# Patient Record
Sex: Male | Born: 1940
Health system: Southern US, Community
[De-identification: ages and names within clinical notes are randomized; demographics above are authoritative.]

## PROBLEM LIST (undated history)

## (undated) DIAGNOSIS — I152 Hypertension secondary to endocrine disorders: Secondary | ICD-10-CM

## (undated) DIAGNOSIS — F329 Major depressive disorder, single episode, unspecified: Secondary | ICD-10-CM

## (undated) DIAGNOSIS — G629 Polyneuropathy, unspecified: Secondary | ICD-10-CM

## (undated) DIAGNOSIS — E669 Obesity, unspecified: Secondary | ICD-10-CM

## (undated) DIAGNOSIS — F419 Anxiety disorder, unspecified: Secondary | ICD-10-CM

## (undated) DIAGNOSIS — I2510 Atherosclerotic heart disease of native coronary artery without angina pectoris: Secondary | ICD-10-CM

## (undated) DIAGNOSIS — M810 Age-related osteoporosis without current pathological fracture: Secondary | ICD-10-CM

## (undated) DIAGNOSIS — M899 Disorder of bone, unspecified: Secondary | ICD-10-CM

## (undated) DIAGNOSIS — K219 Gastro-esophageal reflux disease without esophagitis: Secondary | ICD-10-CM

## (undated) DIAGNOSIS — E119 Type 2 diabetes mellitus without complications: Secondary | ICD-10-CM

## (undated) DIAGNOSIS — E785 Hyperlipidemia, unspecified: Secondary | ICD-10-CM

## (undated) DIAGNOSIS — I10 Essential (primary) hypertension: Secondary | ICD-10-CM

## (undated) DIAGNOSIS — E1140 Type 2 diabetes mellitus with diabetic neuropathy, unspecified: Secondary | ICD-10-CM

## (undated) DIAGNOSIS — Z794 Long term (current) use of insulin: Secondary | ICD-10-CM

## (undated) DIAGNOSIS — N183 Chronic kidney disease, stage 3 unspecified: Secondary | ICD-10-CM

## (undated) DIAGNOSIS — E7800 Pure hypercholesterolemia, unspecified: Secondary | ICD-10-CM

## (undated) DIAGNOSIS — M1990 Unspecified osteoarthritis, unspecified site: Secondary | ICD-10-CM

## (undated) DIAGNOSIS — R55 Syncope and collapse: Secondary | ICD-10-CM

## (undated) DIAGNOSIS — F32A Depression, unspecified: Secondary | ICD-10-CM

## (undated) DIAGNOSIS — D649 Anemia, unspecified: Secondary | ICD-10-CM

## (undated) DIAGNOSIS — R001 Bradycardia, unspecified: Secondary | ICD-10-CM

## (undated) DIAGNOSIS — N189 Chronic kidney disease, unspecified: Secondary | ICD-10-CM

## (undated) DIAGNOSIS — K5792 Diverticulitis of intestine, part unspecified, without perforation or abscess without bleeding: Secondary | ICD-10-CM

## (undated) DIAGNOSIS — F411 Generalized anxiety disorder: Secondary | ICD-10-CM

## (undated) DIAGNOSIS — IMO0001 Reserved for inherently not codable concepts without codable children: Secondary | ICD-10-CM

## (undated) HISTORY — DX: Syncope and collapse: R55

## (undated) HISTORY — DX: Major depressive disorder, single episode, unspecified: F32.9

## (undated) HISTORY — DX: Hypertension secondary to endocrine disorders: I15.2

## (undated) HISTORY — DX: Atherosclerotic heart disease of native coronary artery without angina pectoris: I25.10

## (undated) HISTORY — PX: CATARACT EXTRACTION: SUR2

## (undated) HISTORY — DX: Type 2 diabetes mellitus without complications: E11.9

## (undated) HISTORY — DX: Disorder of bone, unspecified: M89.9

## (undated) HISTORY — DX: Generalized anxiety disorder: F41.1

## (undated) HISTORY — DX: Hyperlipidemia, unspecified: E78.5

## (undated) HISTORY — DX: Long term (current) use of insulin: Z79.4

## (undated) HISTORY — DX: Gastro-esophageal reflux disease without esophagitis: K21.9

## (undated) HISTORY — DX: Chronic kidney disease, stage 3 unspecified: N18.30

## (undated) HISTORY — DX: Type 2 diabetes mellitus with diabetic neuropathy, unspecified: E11.40

## (undated) HISTORY — PX: CARPAL TUNNEL RELEASE: SHX101

## (undated) HISTORY — PX: COLONOSCOPY: SHX174

## (undated) HISTORY — DX: Essential (primary) hypertension: I10

---
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
## 2005-01-30 ENCOUNTER — Ambulatory Visit: Payer: Self-pay | Admitting: Internal Medicine

## 2005-02-17 ENCOUNTER — Ambulatory Visit: Payer: Self-pay | Admitting: Gastroenterology

## 2005-03-24 ENCOUNTER — Ambulatory Visit: Payer: Self-pay | Admitting: Gastroenterology

## 2005-08-11 ENCOUNTER — Ambulatory Visit: Payer: Self-pay | Admitting: Gastroenterology

## 2005-12-06 ENCOUNTER — Ambulatory Visit: Payer: Self-pay | Admitting: Internal Medicine

## 2005-12-16 ENCOUNTER — Ambulatory Visit: Payer: Self-pay | Admitting: Internal Medicine

## 2006-01-16 ENCOUNTER — Ambulatory Visit: Payer: Self-pay | Admitting: Internal Medicine

## 2007-03-18 ENCOUNTER — Ambulatory Visit: Payer: Self-pay | Admitting: Gastroenterology

## 2007-03-27 ENCOUNTER — Ambulatory Visit: Payer: Self-pay | Admitting: Specialist

## 2009-12-03 ENCOUNTER — Ambulatory Visit: Payer: Self-pay | Admitting: Gastroenterology

## 2009-12-06 LAB — PATHOLOGY REPORT

## 2010-03-10 ENCOUNTER — Ambulatory Visit: Payer: Self-pay | Admitting: Orthopedic Surgery

## 2010-03-22 ENCOUNTER — Other Ambulatory Visit: Payer: Self-pay | Admitting: Orthopedic Surgery

## 2010-03-22 DIAGNOSIS — M545 Low back pain, unspecified: Secondary | ICD-10-CM

## 2010-03-24 ENCOUNTER — Ambulatory Visit
Admission: RE | Admit: 2010-03-24 | Discharge: 2010-03-24 | Disposition: A | Discharge status: Home / Self Care | Payer: Medicare Other | Source: Ambulatory Visit | Attending: Orthopedic Surgery | Admitting: Orthopedic Surgery

## 2010-03-24 ENCOUNTER — Other Ambulatory Visit: Payer: Self-pay | Admitting: Orthopedic Surgery

## 2010-03-24 DIAGNOSIS — M545 Low back pain, unspecified: Secondary | ICD-10-CM

## 2010-04-07 ENCOUNTER — Ambulatory Visit
Admission: RE | Admit: 2010-04-07 | Discharge: 2010-04-07 | Disposition: A | Discharge status: Home / Self Care | Payer: Medicare Other | Source: Ambulatory Visit | Attending: Orthopedic Surgery | Admitting: Orthopedic Surgery

## 2010-04-07 DIAGNOSIS — M545 Low back pain, unspecified: Secondary | ICD-10-CM

## 2010-05-12 ENCOUNTER — Other Ambulatory Visit: Payer: Self-pay | Admitting: Orthopedic Surgery

## 2010-05-12 DIAGNOSIS — M545 Low back pain, unspecified: Secondary | ICD-10-CM

## 2010-05-16 ENCOUNTER — Ambulatory Visit
Admission: RE | Admit: 2010-05-16 | Discharge: 2010-05-16 | Disposition: A | Discharge status: Home / Self Care | Payer: Medicare Other | Source: Ambulatory Visit | Attending: Orthopedic Surgery | Admitting: Orthopedic Surgery

## 2010-05-16 DIAGNOSIS — M545 Low back pain, unspecified: Secondary | ICD-10-CM

## 2010-09-09 ENCOUNTER — Other Ambulatory Visit: Payer: Self-pay | Admitting: Orthopedic Surgery

## 2010-09-09 DIAGNOSIS — M545 Low back pain, unspecified: Secondary | ICD-10-CM

## 2010-09-27 ENCOUNTER — Ambulatory Visit
Admission: RE | Admit: 2010-09-27 | Discharge: 2010-09-27 | Disposition: A | Discharge status: Home / Self Care | Payer: Medicare Other | Source: Ambulatory Visit | Attending: Orthopedic Surgery | Admitting: Orthopedic Surgery

## 2010-09-27 DIAGNOSIS — M545 Low back pain, unspecified: Secondary | ICD-10-CM

## 2010-09-27 MED ORDER — METHYLPREDNISOLONE ACETATE 40 MG/ML INJ SUSP (RADIOLOG
120.0000 mg | Freq: Once | INTRAMUSCULAR | Status: AC
  Administered 2010-09-27: 120 mg via EPIDURAL

## 2010-09-27 MED ORDER — IOHEXOL 180 MG/ML  SOLN
1.0000 mL | Freq: Once | INTRAMUSCULAR | Status: AC | PRN
  Administered 2010-09-27: 1 mL via EPIDURAL

## 2011-11-18 ENCOUNTER — Ambulatory Visit: Payer: Self-pay | Admitting: Internal Medicine

## 2012-03-08 ENCOUNTER — Ambulatory Visit: Payer: Self-pay | Admitting: Unknown Physician Specialty

## 2012-10-24 ENCOUNTER — Ambulatory Visit: Payer: Self-pay

## 2013-01-14 ENCOUNTER — Ambulatory Visit: Payer: Self-pay | Admitting: Gastroenterology

## 2013-01-15 LAB — PATHOLOGY REPORT

## 2014-02-15 ENCOUNTER — Emergency Department: Payer: Self-pay | Admitting: Emergency Medicine

## 2014-02-15 LAB — COMPREHENSIVE METABOLIC PANEL
Albumin: 3.8 g/dL (ref 3.4–5.0)
Alkaline Phosphatase: 67 U/L (ref 46–116)
Anion Gap: 5 — ABNORMAL LOW (ref 7–16)
BUN: 22 mg/dL — ABNORMAL HIGH (ref 7–18)
Bilirubin,Total: 0.6 mg/dL (ref 0.2–1.0)
Calcium, Total: 10.4 mg/dL — ABNORMAL HIGH (ref 8.5–10.1)
Chloride: 105 mmol/L (ref 98–107)
Co2: 29 mmol/L (ref 21–32)
Creatinine: 1.36 mg/dL — ABNORMAL HIGH (ref 0.60–1.30)
EGFR (African American): >60
EGFR (Non-African Amer.): 55 — ABNORMAL LOW
Glucose: 90 mg/dL (ref 65–99)
Osmolality: 280 (ref 275–301)
Potassium: 4.9 mmol/L (ref 3.5–5.1)
SGOT(AST): 23 U/L (ref 15–37)
SGPT (ALT): 19 U/L (ref 14–63)
Sodium: 139 mmol/L (ref 136–145)
Total Protein: 7.9 g/dL (ref 6.4–8.2)

## 2014-02-15 LAB — URINALYSIS, COMPLETE
Bacteria: NONE SEEN
Bilirubin,UR: NEGATIVE
Blood: NEGATIVE
Glucose,UR: NEGATIVE mg/dL (ref 0–75)
Ketone: NEGATIVE
Leukocyte Esterase: NEGATIVE
Nitrite: NEGATIVE
Ph: 5 (ref 4.5–8.0)
Protein: NEGATIVE
RBC,UR: <1 /HPF (ref 0–5)
Specific Gravity: 1.014 (ref 1.003–1.030)
Squamous Epithelial: NONE SEEN
WBC UR: <1 /HPF (ref 0–5)

## 2014-02-15 LAB — CBC
HCT: 36.7 % — ABNORMAL LOW (ref 40.0–52.0)
HGB: 12.2 g/dL — ABNORMAL LOW (ref 13.0–18.0)
MCH: 28.9 pg (ref 26.0–34.0)
MCHC: 33.2 g/dL (ref 32.0–36.0)
MCV: 87 fL (ref 80–100)
Platelet: 176 10*3/uL (ref 150–440)
RBC: 4.22 10*6/uL — ABNORMAL LOW (ref 4.40–5.90)
RDW: 12.7 % (ref 11.5–14.5)
WBC: 11 10*3/uL — ABNORMAL HIGH (ref 3.8–10.6)

## 2014-03-05 ENCOUNTER — Ambulatory Visit: Payer: Self-pay | Admitting: Ophthalmology

## 2014-03-05 DIAGNOSIS — I10 Essential (primary) hypertension: Secondary | ICD-10-CM

## 2014-03-06 ENCOUNTER — Emergency Department: Payer: Self-pay | Admitting: Emergency Medicine

## 2014-03-11 ENCOUNTER — Ambulatory Visit: Payer: Self-pay | Admitting: Cardiology

## 2014-03-11 ENCOUNTER — Ambulatory Visit (INDEPENDENT_AMBULATORY_CARE_PROVIDER_SITE_OTHER): Payer: Commercial Managed Care - HMO | Admitting: Cardiology

## 2014-03-11 ENCOUNTER — Encounter (INDEPENDENT_AMBULATORY_CARE_PROVIDER_SITE_OTHER): Payer: Self-pay

## 2014-03-11 ENCOUNTER — Encounter: Payer: Self-pay | Admitting: Cardiology

## 2014-03-11 VITALS — BP 156/74 | HR 49 | Ht 65.0 in | Wt 184.0 lb

## 2014-03-11 DIAGNOSIS — I10 Essential (primary) hypertension: Secondary | ICD-10-CM

## 2014-03-11 DIAGNOSIS — R0989 Other specified symptoms and signs involving the circulatory and respiratory systems: Secondary | ICD-10-CM

## 2014-03-11 DIAGNOSIS — Z794 Long term (current) use of insulin: Secondary | ICD-10-CM

## 2014-03-11 DIAGNOSIS — R6889 Other general symptoms and signs: Secondary | ICD-10-CM

## 2014-03-11 DIAGNOSIS — R0600 Dyspnea, unspecified: Secondary | ICD-10-CM

## 2014-03-11 DIAGNOSIS — E785 Hyperlipidemia, unspecified: Secondary | ICD-10-CM

## 2014-03-11 DIAGNOSIS — R001 Bradycardia, unspecified: Secondary | ICD-10-CM

## 2014-03-11 DIAGNOSIS — R55 Syncope and collapse: Secondary | ICD-10-CM

## 2014-03-11 DIAGNOSIS — R0609 Other forms of dyspnea: Secondary | ICD-10-CM

## 2014-03-11 DIAGNOSIS — E119 Type 2 diabetes mellitus without complications: Secondary | ICD-10-CM

## 2014-03-11 NOTE — Patient Instructions (Addendum)
Your physician has requested that you have an echocardiogram. Echocardiography is a painless test that uses sound waves to create images of your heart. It provides your doctor with information about the size and shape of your heart and how well your heart's chambers and valves are working. This procedure takes approximately one hour. There are no restrictions for this procedure.  Your physician has requested that you have a carotid duplex. This test is an ultrasound of the carotid arteries in your neck. It looks at blood flow through these arteries that supply the brain with blood. Allow one hour for this exam. There are no restrictions or special instructions.  Your physician has recommended that you wear an event monitor. Event monitors are medical devices that record the heart's electrical activity. Doctors most often [place] these monitors to diagnose arrhythmias. Arrhythmias are problems with the speed or rhythm of the heartbeat. The monitor is a small, portable device. You can wear one while you do your normal daily activities. This is usually used to diagnose what is causing palpitations/syncope (passing out).  Ronald Wallace  Your caregiver has ordered a Stress Test with nuclear imaging. The purpose of this test is to evaluate the blood supply to your heart muscle. This procedure is referred to as a "Non-Invasive Stress Test." This is because other than having an IV started in your vein, nothing is inserted or "invades" your body. Cardiac stress tests are done to find areas of poor blood flow to the heart by determining the extent of coronary artery disease (CAD). Some patients exercise on a treadmill, which naturally increases the blood flow to your heart, while others who are  unable to walk on a treadmill due to physical limitations have a pharmacologic/chemical stress agent called Lexiscan . This medicine will mimic walking on a treadmill by temporarily increasing your coronary blood flow.   Please  note: these test may take anywhere between 2-4 hours to complete  PLEASE REPORT TO [name] AT THE FIRST DESK WILL DIRECT YOU WHERE TO GO  Date of Procedure:___________2/26/16_________________________  Arrival Time for Procedure:_______0745 am_______________________  Instructions regarding medication:   _x___ : Hold diabetes medication morning of procedure    PLEASE NOTIFY THE OFFICE AT LEAST 24 HOURS IN ADVANCE IF YOU ARE UNABLE TO KEEP YOUR APPOINTMENT.  [phone] AND  PLEASE NOTIFY NUCLEAR MEDICINE AT [facility] AT LEAST 24 HOURS IN ADVANCE IF YOU ARE UNABLE TO KEEP YOUR APPOINTMENT. [phone]  How to prepare for your Myoview test:  1. Do not eat or drink after midnight 2. No caffeine for 24 hours prior to test 3. No smoking 24 hours prior to test. 4. Your medication may be taken with water.  If your doctor stopped a medication because of this test, do not take that medication. 5. Ladies, please do not wear dresses.  Skirts or pants are appropriate. Please wear a short sleeve shirt. 6. No perfume, cologne or lotion. 7. Wear comfortable walking shoes. No heels!    NO WORK OR DRIVING UNTIL AFTER YOUR ECHO AND STRESS TEST

## 2014-03-12 ENCOUNTER — Encounter: Payer: Self-pay | Admitting: Cardiology

## 2014-03-12 ENCOUNTER — Telehealth: Payer: Self-pay | Admitting: *Deleted

## 2014-03-12 ENCOUNTER — Other Ambulatory Visit (INDEPENDENT_AMBULATORY_CARE_PROVIDER_SITE_OTHER): Payer: Commercial Managed Care - HMO

## 2014-03-12 ENCOUNTER — Other Ambulatory Visit: Payer: Self-pay

## 2014-03-12 DIAGNOSIS — I152 Hypertension secondary to endocrine disorders: Secondary | ICD-10-CM | POA: Insufficient documentation

## 2014-03-12 DIAGNOSIS — R55 Syncope and collapse: Secondary | ICD-10-CM

## 2014-03-12 DIAGNOSIS — R0609 Other forms of dyspnea: Secondary | ICD-10-CM

## 2014-03-12 DIAGNOSIS — R001 Bradycardia, unspecified: Secondary | ICD-10-CM | POA: Insufficient documentation

## 2014-03-12 DIAGNOSIS — R0989 Other specified symptoms and signs involving the circulatory and respiratory systems: Secondary | ICD-10-CM

## 2014-03-12 DIAGNOSIS — R0600 Dyspnea, unspecified: Secondary | ICD-10-CM | POA: Insufficient documentation

## 2014-03-12 DIAGNOSIS — I10 Essential (primary) hypertension: Secondary | ICD-10-CM | POA: Insufficient documentation

## 2014-03-12 DIAGNOSIS — E785 Hyperlipidemia, unspecified: Secondary | ICD-10-CM | POA: Insufficient documentation

## 2014-03-12 DIAGNOSIS — R6889 Other general symptoms and signs: Secondary | ICD-10-CM | POA: Insufficient documentation

## 2014-03-12 DIAGNOSIS — E119 Type 2 diabetes mellitus without complications: Secondary | ICD-10-CM | POA: Insufficient documentation

## 2014-03-12 DIAGNOSIS — E1159 Type 2 diabetes mellitus with other circulatory complications: Secondary | ICD-10-CM | POA: Insufficient documentation

## 2014-03-12 DIAGNOSIS — Z794 Long term (current) use of insulin: Secondary | ICD-10-CM

## 2014-03-12 HISTORY — PX: TRANSTHORACIC ECHOCARDIOGRAM: SHX275

## 2014-03-12 HISTORY — DX: Bradycardia, unspecified: R00.1

## 2014-03-12 HISTORY — DX: Dyspnea, unspecified: R06.00

## 2014-03-12 HISTORY — DX: Other forms of dyspnea: R06.09

## 2014-03-12 HISTORY — DX: Other specified symptoms and signs involving the circulatory and respiratory systems: R09.89

## 2014-03-12 NOTE — Assessment & Plan Note (Signed)
I see that he is taking Neurontin which I suggested he has neuropathy. Cannot exclude autonomic dysfunction as the etiology of syncope.

## 2014-03-12 NOTE — Progress Notes (Signed)
PATIENT: Ronald Wallace MRN: [id number] DOB: 01-02-1941 PCP: [name], MD  Clinic Note: Chief Complaint  Patient presents with  . other    C/o syncope. Meds reviewed verbally with pt.    HPI: Ronald Wallace is a 74 y.o. male with a PMH below who presents today for evaluation of a syncopal event. He has no history of type 2 diabetes on insulin, hypertension hyperlipidemia no previous diagnosed cardiac history. His mother apparently had an MI in her 8s. Ronald Wallace was referred for cardiology evaluation after presenting to the [facility] emergency room the following a recent syncopal episode. He was in his usual state of health, with no major complaints besides what seems to be progressively worsening exertional dyspnea and decreased exercise tolerance. His daughter want to make sure that he mentioned that over the last several months he has been getting more tired and short of breath with exertion. He is not able to take a flight of steps without getting short of breath. He does not note any resting or exertional chest tightness or pressure however. On Tuesday, February 16, he was at work, feeling relatively well with no issues. He had a routine breakfast but nothing different that day.  He was doing his routine of checking Card-Key locks throughout the building. He was on a small platform checking on a dose, and when he turned around to go to the next-door she blacked out and fell off a platform landing on his ribs. He apparently also hit his head, was not aware of it. When he first became aroused following a fall he was quite disoriented, did not know that he did hit his head. Someone mentioned that he had some dullness at doses ribs are hurting quite a bit so he went to be evaluated at the urgent care/emergency room. Chest x-rays were done which did not show any evidence of fractures, but no evaluation was done to workup for syncope or potential concussion. They note that he was  bradycardic with a heart rate of 50 beats per minute on his EKG in the emergency room. He has not had any further passing out spells since that episode. He denied having any sensation of palpitations rapid/irregular heartbeats, chest pain or dyspnea prior to this episode appeared nothing led up to it, and he felt fine afterwards.  He does have some mild lower extremity edema, but denies any PND or orthopnea.  He has not had any yet symptoms to suggest TIA or amaurosis fugax. After his family denied any focal weakness or numbness.his loss of consciousness episode was not preceded by any lightheadedness or dizziness. He did not note that the spinous and around very fast. It was not positional, although he does note occasionally feeling dizzy when he quickly stands.  Past Medical History  Diagnosis Date  . Syncope and collapse   . Essential hypertension   . Hyperlipidemia   . Diabetes mellitus, type II, insulin dependent     With neuropathy    Prior Cardiac Evaluation and Past Surgical History: Past Surgical History  Procedure Laterality Date  . Carpal tunnel release     he reports that he may have had a heart catheterization 10 years ago, and was told it was okay.  Allergies  Allergen Reactions  . Amlodipine     Other reaction(s): Other (See Comments) Peripheral edema    Current Outpatient Prescriptions  Medication Sig Dispense Refill  . aspirin 81 MG tablet Take 81 mg by mouth daily.    Marland Kitchen  calcium carbonate (OS-CAL) 600 MG TABS tablet Take 600 mg by mouth daily with breakfast.    . cholecalciferol (VITAMIN D) 1000 UNITS tablet Take 1,000 Units by mouth daily.    . Ferrous Sulfate (IRON) 325 (65 FE) MG TABS Take by mouth daily.    Marland Kitchen gabapentin (NEURONTIN) 300 MG capsule Take 600 mg by mouth at bedtime.    . hydrochlorothiazide (MICROZIDE) 12.5 MG capsule Take 12.5 mg by mouth daily.    Marland Kitchen lisinopril (PRINIVIL,ZESTRIL) 20 MG tablet Take 20 mg by mouth daily.    . metFORMIN  (GLUCOPHAGE) 500 MG tablet Take 250 mg by mouth 2 (two) times daily with a meal.    . PARoxetine (PAXIL) 20 MG tablet Take 20 mg by mouth daily.    . simvastatin (ZOCOR) 20 MG tablet Take 20 mg by mouth daily.    . tamsulosin (FLOMAX) 0.4 MG CAPS capsule Take 0.4 mg by mouth daily.     No current facility-administered medications for this visit.    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   Married - at least 1 daughter.   Never Smoked   Usually very active, but no exercise routine.    family history includes Heart attack in his mother.  ROS: A comprehensive Review of Systems - was performed Review of Systems  Constitutional: Positive for malaise/fatigue (Overall decreased exercise tolerance. He gets tired easily.).  HENT: Negative for nosebleeds.   Eyes: Negative for double vision.  Respiratory: Negative for cough, shortness of breath and wheezing.   Cardiovascular: Negative for claudication.       Per history of present illness  Gastrointestinal: Negative for blood in stool and melena.  Genitourinary: Negative for hematuria.  Musculoskeletal: Positive for falls (Related to recent syncopal episode. Otherwise no falls).        Left-sided ribs pain from recent fall  Neurological: Positive for loss of consciousness and headaches. Negative for dizziness, sensory change, speech change and focal weakness.  Endo/Heme/Allergies: Negative.   Psychiatric/Behavioral: Negative.   All other systems reviewed and are negative.   PHYSICAL EXAM BP 156/74 mmHg  Pulse 49  Ht 5\' 5"  (1.651 m)  Wt 184 lb (83.462 kg)  BMI 30.62 kg/m2  Orthostatic pressures: Supine 166/80 mmHg, rate 68. Seated: 138/72 mmHg, rate 58; standing 141/67 mmHg rate 63 - after 5 min 153/72 mmHg rate 61 = 4 and 4 orthostatic hypotension changes General appearance: alert, cooperative, appears stated age, no distress and Borderline obese. Well-nourished, well-groomed. HEENT: [place]/AT,  EOMI, MMM, anicteric sclera Neck: no adenopathy, no JVD, supple, symmetrical, trachea midline and Soft left carotid bruit Lungs: clear to auscultation bilaterally, normal percussion bilaterally and Nonlabored, good air movement. Heart: regular rate and rhythm, S1, S2 normal, no murmur, click, rub or gallop and normal apical impulse Abdomen: soft, non-tender; bowel sounds normal; no masses,  no organomegaly and No HSM. Extremities: extremities normal, atraumatic, no cyanosis or edema Pulses: 2+ and symmetric Skin: Skin color, texture, turgor normal. No rashes or lesions Neurologic: Alert and oriented X 3, normal strength and tone. Normal symmetric reflexes. Normal coordination and gait; CN II-XII grossly intact MSK: tenderness along L lower ribs    Adult ECG Report  Rate: 49 ;  Rhythm: sinus bradycardia  QRS Axis: 33 ;  PR Interval: 176 ;  QRS Duration: 86 ; QTc: 393;  Voltages: normal  Conduction Disturbances: none  Other Abnormalities: none  Narrative Interpretation: Marked Sinus Bradycardia, otherwise normal  Recent Labs: 03/06/14  Na+ 132, K+ 4.6, Cl- 100, HCO3- 25 , BUN 27, Cr 1.28, Glu 219, Ca2+ 9.0; AST 22, ALT 42 AlkP 48, Alb 3.7, TP 6.7, T Bili  0.4  CBC: W 6.2, H/H 11.0/32.9, Plt 171  ASSESSMENT / PLAN: Radius and situation with a 5 year old gentleman with multiple cardiac risk factors including type 2 diabetes on insulin, hypertension and hyperlipidemia with a family history of CAD prematurely who presents with a syncopal episode but also has progressively worsening exertional dyspnea. These may be true true and unrelated also may potentially be related from CAD standpoint. He is a relatively stoic gentleman, and needed to be controlled by his wife and daughter to be forthcoming with his exertional dyspnea and decreased exercise tolerance.  I intend to workup for potential ischemic as well as an arrhythmogenic etiology for his symptoms.  His orthostatic blood pressures did reveal  that he has a tendency for orthostasis, however his resting blood pressure is somewhat high today. I would be inclined to allow him to have some permissive hypertension.  He also has bradycardia with heart rates in the high 40s to 50s. This would suggest a potential chronotropic incompetence for his exercise intolerance. He could also potentially be at risk for bradycardia mediated syncopal episode.  Episode of syncope Cannot be sure what actually happened. I am concerned about potential arrhythmia with these baseline bradycardia. I'm also concerned with the potential risk for ischemic heart disease that he could have an arrhythmia. I did not hear her murmur, I did hear a carotid bruit.ea Plan: 2-D echocardiogram, carotid Dopplers, 30 day event monitor. For ischemic evaluation Treadmill Myoview stress test.  I have recommended that he does notreturn to work or drive until we have at least the results of his echocardiogram and Myoview.   I do want him to be as active as he usually is at baseline while wearing his monitor.   Decreased exercise tolerance This is a gentleman who was routinely very active with no complaints, who is now noted about 2 months of progressively worsening exercise tolerance and dyspnea on exertion to keep it from climbing a flight of steps without stopping. He has multiple cardiac risk factors as noted above.  Plan: Treadmill Myoview -- this will allow me to assess both his heart rate response to exercise tube to rule out chronotropic competence with his baseline bradycardia as well as to look for ischemic etiology. He is at least moderate risk of cardiac etiology therefore Myoview imaging is indicated.   DOE (dyspnea on exertion) See above   Bruit of left carotid artery In the setting of cardiovascular risk factors and a recent syncopal episode at cannot be excluded as a TIA, I will check carotid Dopplers to exclude severe carotid stenosis.   Hyperlipidemia On  simvastatin. Has been followed by me. I don't know his recent levels were.   Essential hypertension His blood pressure is little elevated today. As indicated with some orthostatic changes, I would be inclined to allow him to have some permissive hypertension especially as we have not yet figured out his cause for syncope.   Diabetes mellitus, type II, insulin dependent I see that he is taking Neurontin which I suggested he has neuropathy. Cannot exclude autonomic dysfunction as the etiology of syncope.   Bradycardia with 41 - 50 beats per minute He does have resting bradycardia, potential etiology for her as her could be contributing to this. Also would be concerned about possible bradycardia mediated syncope.  Plan: Treadmill portion of Myoview to assess chronotropic competence. 30 day monitor to evaluate for alterations in heart rate.     Orders Placed This Encounter  Procedures  . Myocardial Perfusion Imaging    Standing Status: Future     Number of Occurrences:      Standing Expiration Date: 03/11/2015    Scheduling Instructions:     To be performed at [facility]    Order Specific Question:  Where should this test be performed    Answer:  Other    Order Specific Question:  Type of stress    Answer:  Exercise    Order Specific Question:  Patient weight in lbs    Answer:  184  . EKG 12-Lead    Order Specific Question:  Where should this test be performed    Answer:  [facility]  . Cardiac event monitor    Standing Status: Future     Number of Occurrences:      Standing Expiration Date: 03/11/2015  . 2D Echocardiogram without contrast    Standing Status: Future     Number of Occurrences: 1     Standing Expiration Date: 03/11/2015    Order Specific Question:  Type of Echo    Answer:  Complete    Order Specific Question:  Where should this test be performed    Answer:  [facility]    Order Specific Question:  Reason for exam-Echo    Answer:  Dyspnea  786.09 / R06.00  .  Doppler carotid    Standing Status: Future     Number of Occurrences:      Standing Expiration Date: 03/11/2015    Order Specific Question:  Laterality    Answer:  Bilateral    Order Specific Question:  Where should this test be performed:    Answer:  [facility]     Followup: 1 months   Emiliana Blaize, Leonie Green, M.D., M.S. Interventional Cardiologist   Pager # [phone]

## 2014-03-12 NOTE — Telephone Encounter (Signed)
Patient called to make sure he did not need to take his diabetes medication before his stress test tomorrow am  I confirmed with patient to hold diabetes medication the morning of his test  Informed him that he can take all other meds with enough water to swallow safely

## 2014-03-12 NOTE — Assessment & Plan Note (Addendum)
Cannot be sure what actually happened. I am concerned about potential arrhythmia with these baseline bradycardia. I'm also concerned with the potential risk for ischemic heart disease that he could have an arrhythmia. I did not hear her murmur, I did hear a carotid bruit.ea Plan: 2-D echocardiogram, carotid Dopplers, 30 day event monitor. For ischemic evaluation Treadmill Myoview stress test.  I have recommended that he does notreturn to work or drive until we have at least the results of his echocardiogram and Myoview.   I do want him to be as active as he usually is at baseline while wearing his monitor.

## 2014-03-12 NOTE — Assessment & Plan Note (Signed)
This is a gentleman who was routinely very active with no complaints, who is now noted about 2 months of progressively worsening exercise tolerance and dyspnea on exertion to keep it from climbing a flight of steps without stopping. He has multiple cardiac risk factors as noted above.  Plan: Treadmill Myoview -- this will allow me to assess both his heart rate response to exercise tube to rule out chronotropic competence with his baseline bradycardia as well as to look for ischemic etiology. He is at least moderate risk of cardiac etiology therefore Myoview imaging is indicated.

## 2014-03-12 NOTE — Assessment & Plan Note (Signed)
See above

## 2014-03-12 NOTE — Assessment & Plan Note (Signed)
In the setting of cardiovascular risk factors and a recent syncopal episode at cannot be excluded as a TIA, I will check carotid Dopplers to exclude severe carotid stenosis.

## 2014-03-12 NOTE — Assessment & Plan Note (Signed)
On simvastatin. Has been followed by me. I don't know his recent levels were.

## 2014-03-12 NOTE — Assessment & Plan Note (Signed)
He does have resting bradycardia, potential etiology for her as her could be contributing to this. Also would be concerned about possible bradycardia mediated syncope. Plan: Treadmill portion of Myoview to assess chronotropic competence. 30 day monitor to evaluate for alterations in heart rate.

## 2014-03-12 NOTE — Assessment & Plan Note (Signed)
His blood pressure is little elevated today. As indicated with some orthostatic changes, I would be inclined to allow him to have some permissive hypertension especially as we have not yet figured out his cause for syncope.

## 2014-03-13 ENCOUNTER — Other Ambulatory Visit: Payer: Self-pay

## 2014-03-13 ENCOUNTER — Encounter (INDEPENDENT_AMBULATORY_CARE_PROVIDER_SITE_OTHER): Payer: Commercial Managed Care - HMO

## 2014-03-13 ENCOUNTER — Ambulatory Visit: Payer: Self-pay | Admitting: Cardiology

## 2014-03-13 DIAGNOSIS — I6523 Occlusion and stenosis of bilateral carotid arteries: Secondary | ICD-10-CM

## 2014-03-13 DIAGNOSIS — R0989 Other specified symptoms and signs involving the circulatory and respiratory systems: Secondary | ICD-10-CM

## 2014-03-13 DIAGNOSIS — R0600 Dyspnea, unspecified: Secondary | ICD-10-CM

## 2014-03-13 DIAGNOSIS — R55 Syncope and collapse: Secondary | ICD-10-CM

## 2014-03-13 DIAGNOSIS — R0609 Other forms of dyspnea: Secondary | ICD-10-CM

## 2014-03-13 HISTORY — PX: NM MYOVIEW LTD: HXRAD82

## 2014-03-16 ENCOUNTER — Telehealth: Payer: Self-pay | Admitting: *Deleted

## 2014-03-16 ENCOUNTER — Ambulatory Visit: Payer: Self-pay | Admitting: Ophthalmology

## 2014-03-16 NOTE — Telephone Encounter (Signed)
Please call patient's daughter Larene Beach. She is wanting the results of echo and stress test.

## 2014-03-16 NOTE — Progress Notes (Signed)
Quick Note:  Echo results: Good news: Essentially normal echocardiogram and normal pump function and normal valve function. No regional wall motion abnormalities = No signs to suggest heart attack.. EF: 50-55%.   [place] ______

## 2014-03-16 NOTE — Telephone Encounter (Signed)
Pt wife calling stating that the results for echo and other tests  For pt wife states that Elmyra Ricks went over the results with her but she did not understand one thing States she was tired and just woke up please call daughter   Can we send the results to Dr Netty Starring,  At [place] [facility].

## 2014-03-16 NOTE — Progress Notes (Signed)
Quick Note:  Stress Test looked good!! No sign of significant Heart Artery Disease. Pump function is normal.  Good news!!.  Ronald Cowles W, MD  Pls forward to PCP ______

## 2014-03-16 NOTE — Telephone Encounter (Signed)
Patients daughter asked if based on patients echo, carotid duplex, and myoview if patient can resume driving?

## 2014-03-16 NOTE — Telephone Encounter (Signed)
Yes - OK to drive.  [place]

## 2014-03-17 NOTE — Telephone Encounter (Signed)
Informed patient of Dr. Darcus Pester response  Patient verbalized understanding

## 2014-03-23 ENCOUNTER — Telehealth: Payer: Self-pay

## 2014-03-23 NOTE — Telephone Encounter (Signed)
Pt would like to know if his holter monitor is covered by his insurance. Pt has [employer]. Please call and advise.

## 2014-03-24 NOTE — Telephone Encounter (Signed)
LVM 3/8

## 2014-03-24 NOTE — Telephone Encounter (Signed)
Pt would like to know if the monitor is really needed, for it may not be covered by insurance. Pt is finding out but wanted to know from our side if it was really needed.  Please advise.

## 2014-03-25 NOTE — Telephone Encounter (Signed)
Patient called and wanted to ask Dr. Ellyn Hack if he really still needed the event monitor  Patient asked if his other tests were conclusive enough to avoid the monitor  I explained that the monitor would give the doctor different information than the echo and carotid doppler  Patient stated he was worried it would be costly and wanted me to double check with the doctor anyway

## 2014-03-25 NOTE — Telephone Encounter (Signed)
Yes - he needs to wear the monitor b/c we still do not know why he passed out. Monitor is not costly compared to passing out again & having a bad outcome. We may not figure it out, but the longer he wears the monitory, the more likely we may be able to.Marland Kitchen  [place]

## 2014-03-26 NOTE — Telephone Encounter (Signed)
Informed patient of Dr. Darcus Pester response  Patient verbalized understanding

## 2014-03-30 DIAGNOSIS — R55 Syncope and collapse: Secondary | ICD-10-CM | POA: Diagnosis not present

## 2014-04-01 ENCOUNTER — Encounter: Payer: Self-pay | Admitting: Cardiology

## 2014-04-01 ENCOUNTER — Ambulatory Visit (INDEPENDENT_AMBULATORY_CARE_PROVIDER_SITE_OTHER): Payer: Commercial Managed Care - HMO | Admitting: Cardiology

## 2014-04-01 VITALS — BP 120/70 | HR 60 | Ht 68.0 in | Wt 189.2 lb

## 2014-04-01 DIAGNOSIS — I10 Essential (primary) hypertension: Secondary | ICD-10-CM

## 2014-04-01 DIAGNOSIS — R001 Bradycardia, unspecified: Secondary | ICD-10-CM

## 2014-04-01 DIAGNOSIS — R55 Syncope and collapse: Secondary | ICD-10-CM

## 2014-04-01 DIAGNOSIS — R0989 Other specified symptoms and signs involving the circulatory and respiratory systems: Secondary | ICD-10-CM

## 2014-04-01 DIAGNOSIS — R6889 Other general symptoms and signs: Secondary | ICD-10-CM

## 2014-04-01 NOTE — Patient Instructions (Signed)
Your physician wants you to follow-up in:  6 months. You will receive a reminder letter in the mail two months in advance. If you don't receive a letter, please call our office to schedule the follow-up appointment.   

## 2014-04-03 ENCOUNTER — Encounter: Payer: Self-pay | Admitting: Cardiology

## 2014-04-03 NOTE — Assessment & Plan Note (Signed)
Relatively normal carotid Dopplers

## 2014-04-03 NOTE — Progress Notes (Signed)
PATIENT: Ronald Wallace MRN: [id number] DOB: 1940/12/24 PCP: [name], MD  Clinic Note: Chief Complaint  Patient presents with  . other    F/u echo and carotid no complaints. Meds reviewed verbally with pt.  . Loss of Consciousness    HPI: Ronald Wallace is a 74 y.o. male with a PMH below who presents today followup evaluation for exertional dyspnea and an episode of syncope. I saw him for initial consultation on February 25. He had been noticing some worsening exertional dyspnea and fatigue but not had one episode where he lost consciousness for short term and fell off a platform.  He had an echocardiogram performed as well as a Myoview stress test which are summarized below (& updated in PMH/PSH). He was thought to be wearing a cardiac event monitor, but that just was delivered a couple days ago we don't have any evaluation yet.  2D Echo: - 03/12/2014  Normal LV size and function. EF 50-55%.  No Regional Wall Motion Abnormality (to suggest prior infarct or conduction delay)  Grade 1 Diastolic Dysfunction (abnormal relaxation)  Mild Mitral Regurgitation  Mild Left Atrial Dilation  Normal RV function & pressures   noted HR in 40s   TM Myoview: 03/13/14 -- LOW RISK - positive EKG but negative imaging.  Normal LV function.EF 65%  No evidence of ischemia or infarction.  EKG changes concerning for ischemia with 2 mm ST depressions in V4 and V5.  Carotid Doppler 03/13/2014  Heterogenous plaque bilaterally; 1-39% (mild) ICA stenosis bilateral  Normal Bilateral Subclavian Arteries   Patent Vertebral arteries with antegrade flow  Past Medical History  Diagnosis Date  . Syncope and collapse     Carotid Dopplers: 1-39% bilateral ICA with noramal Subclavian & vertebral arteries; 2 D Echo: EF 50-55%, Gr 1 DD, Mild MR & LA dilation -- was bradycardic in 40s   . Essential hypertension   . Hyperlipidemia   . Diabetes mellitus, type II, insulin dependent     With neuropathy    Prior Cardiac Evaluation and Past Surgical History: Past Surgical History  Procedure Laterality Date  . Carpal tunnel release    . Cataract extraction Right   . Transthoracic echocardiogram  03/12/2014    EF 50-55%, Gr 1 DD, no RWMA, Mlid MR & LA dil, Normla RV and bradycardic 40s -  . Nm myoview ltd  03/13/2014    LOW RISK - positive EKG but negative imaging: 2 mm ST Depression in V4 & V5 but No scintigraphic evidence of Ischemia or Infarct o   he reports that he may have had a heart catheterization 10 years ago, and was told it was okay.  Allergies  Allergen Reactions  . Amlodipine     Other reaction(s): Other (See Comments) Peripheral edema    Current Outpatient Prescriptions  Medication Sig Dispense Refill  . aspirin 81 MG tablet Take 81 mg by mouth daily.    . calcium carbonate (OS-CAL) 600 MG TABS tablet Take 600 mg by mouth daily with breakfast.    . cholecalciferol (VITAMIN D) 1000 UNITS tablet Take 1,000 Units by mouth daily.    . Ferrous Sulfate (IRON) 325 (65 FE) MG TABS Take by mouth daily.    Marland Kitchen gabapentin (NEURONTIN) 300 MG capsule Take 600 mg by mouth at bedtime.    . hydrochlorothiazide (MICROZIDE) 12.5 MG capsule Take 12.5 mg by mouth daily.    Marland Kitchen lisinopril (PRINIVIL,ZESTRIL) 20 MG tablet Take 20 mg by mouth daily.    Marland Kitchen  metFORMIN (GLUCOPHAGE) 500 MG tablet Take 250 mg by mouth 2 (two) times daily with a meal.    . PARoxetine (PAXIL) 20 MG tablet Take 20 mg by mouth daily.    . simvastatin (ZOCOR) 20 MG tablet Take 20 mg by mouth daily.    . tamsulosin (FLOMAX) 0.4 MG CAPS capsule Take 0.4 mg by mouth daily.     No current facility-administered medications for this visit.    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   Married - at least 1 daughter.   Never Smoked   Usually very active, but no exercise routine.    family history includes Heart attack in his mother.  ROS: A comprehensive Review of  Systems - was performed Review of Systems  Constitutional: Positive for malaise/fatigue (Still feels a little bit of decreased exercise tolerance and fatigue, but not as significant.).  Eyes: Negative for blurred vision and double vision.  Cardiovascular: Negative for palpitations.  Gastrointestinal: Negative for blood in stool and melena.  Genitourinary: Negative for hematuria.  Neurological: Negative for dizziness, loss of consciousness and headaches.  All other systems reviewed and are negative.   PHYSICAL EXAM BP 120/70 mmHg  Pulse 60  Ht 5\' 8"  (1.727 m)  Wt 189 lb 4 oz (85.843 kg)  BMI 28.78 kg/m2  Orthostatic pressures: Supine 166/80 mmHg, rate 68. Seated: 138/72 mmHg, rate 58; standing 141/67 mmHg rate 63 - after 5 min 153/72 mmHg rate 61 = 4 and 4 orthostatic hypotension changes General appearance: alert, cooperative, appears stated age, no distress and Borderline obese. Well-nourished, well-groomed. HEENT: [place]/AT, EOMI, MMM, anicteric sclera Neck: no adenopathy, no JVD, supple, symmetrical, trachea midline and Soft left carotid bruit Lungs: clear to auscultation bilaterally, normal percussion bilaterally and Nonlabored, good air movement. Heart: regular rate and rhythm, S1, S2 normal, no murmur, click, rub or gallop and normal apical impulse Abdomen: soft, non-tender; bowel sounds normal; no masses,  no organomegaly and No HSM. Extremities: extremities normal, atraumatic, no cyanosis or edema Pulses: 2+ and symmetric Skin: Skin color, texture, turgor normal. No rashes or lesions Neurologic: Alert and oriented X 3, normal strength and tone. Normal symmetric reflexes. Normal coordination and gait; CN II-XII grossly intact MSK: tenderness along L lower ribs    Adult ECG Report  Rate: 49 ;  Rhythm: sinus bradycardia  QRS Axis: 33 ;  PR Interval: 176 ;  QRS Duration: 86 ; QTc: 393;  Voltages: normal  Conduction Disturbances: none  Other Abnormalities: none  Narrative  Interpretation: Marked Sinus Bradycardia, otherwise normal  Recent Labs: 03/06/14  Na+ 132, K+ 4.6, Cl- 100, HCO3- 25 , BUN 27, Cr 1.28, Glu 219, Ca2+ 9.0; AST 22, ALT 42 AlkP 48, Alb 3.7, TP 6.7, T Bili  0.4  CBC: W 6.2, H/H 11.0/32.9, Plt 171  ASSESSMENT / PLAN: Still replacing ASO to use exercise tolerance. EKG seemed to be abnormal but the Myoview was completely negative for ischemia. As for his syncopal episode, carotids were normal. He is now wearing his monitor. This will allow [place] to assess for possible symptomatic bradycardia since he had heart rates in the 40s noted during his echocardiogram. He did not have signs of chronotropic incompetence however.  Problem List Items Addressed This Visit    Bradycardia with 41 - 50 beats per minute (Chronic)    followup monitor. Avoid AV nodal agents No signs of carotid incompetence on the treadmill      Bruit of  left carotid artery    Relatively normal carotid Dopplers      Decreased exercise tolerance    Possibly deconditioning, however with abnormal EKG I still not ruling out 100% the possibility of false-positive stress test. He did not have symptoms with EKG changes which make that less likely.  Continue to monitor, and if symptoms progress, would like to consider invasive evaluation.      Episode of syncope - Primary    Normal echocardiogram and carotid Dopplers. No infarct orischemia on Myoview. No recurrent episodes. He did have some recurrent noted on the echocardiogram report.  Plan: He still has his monitor which we will continue to follow. He had some issues with the battery charging which were addressed today. Avoid beta blockers or non dihydropyridine CCBs.      Essential hypertension (Chronic)    Well controlled today. Would not be aggressive in treating because he did have some mild orthostatic changes during his initial evaluation. Allow for permissive hypertension.         No orders of the defined types were  placed in this encounter.    Followup: 6 months - unless the Monitor demonstrates concerning findings.   Leonie Man, M.D., M.S. Interventional Cardiologist   Pager # [phone]

## 2014-04-03 NOTE — Assessment & Plan Note (Signed)
followup monitor. Avoid AV nodal agents No signs of carotid incompetence on the treadmill

## 2014-04-03 NOTE — Assessment & Plan Note (Signed)
Possibly deconditioning, however with abnormal EKG I still not ruling out 100% the possibility of false-positive stress test. He did not have symptoms with EKG changes which make that less likely.  Continue to monitor, and if symptoms progress, would like to consider invasive evaluation.

## 2014-04-03 NOTE — Assessment & Plan Note (Signed)
Normal echocardiogram and carotid Dopplers. No infarct orischemia on Myoview. No recurrent episodes. He did have some recurrent noted on the echocardiogram report.  Plan: He still has his monitor which we will continue to follow. He had some issues with the battery charging which were addressed today. Avoid beta blockers or non dihydropyridine CCBs.

## 2014-04-03 NOTE — Assessment & Plan Note (Signed)
Well controlled today. Would not be aggressive in treating because he did have some mild orthostatic changes during his initial evaluation. Allow for permissive hypertension.

## 2014-05-14 ENCOUNTER — Telehealth: Payer: Self-pay | Admitting: Cardiology

## 2014-05-14 NOTE — Telephone Encounter (Signed)
Dr. Ellyn Hack reviewed the patient's event monitor. "NSR with artifact/ sinus bradycardia/ no afib/ occasional PAC's"  I called and notified the patient of his results.

## 2014-05-17 NOTE — Op Note (Signed)
PATIENT NAME:  Ronald Wallace, Ronald Wallace MR#:  [id number] DATE OF BIRTH:  1940/08/28  DATE OF PROCEDURE:  03/16/2014  PREOPERATIVE DIAGNOSIS:  Nuclear sclerotic cataract, right eye.   POSTOPERATIVE DIAGNOSIS:  Nuclear sclerotic cataract, right eye.  PROCEDURE PERFORMED:  Extracapsular cataract extraction using phacoemulsification with placement of an Alcon [id number], 20.0-diopter posterior chamber lens, serial [id number].  SURGEON:  Loura Back. Minas Bonser, MD  ASSISTANT:  None.  ANESTHESIA:  4% lidocaine and 0.75% Marcaine in a 50/50 mixture with 10 units/mL of Hylenex added, given as a peribulbar.   ANESTHESIOLOGIST:  Thomas  COMPLICATIONS:  None.  ESTIMATED BLOOD LOSS:  Less than 1 ml.  DESCRIPTION OF PROCEDURE:  The patient was brought to the operating room and given a peribulbar block.  The patient was then prepped and draped in the usual fashion.  The vertical rectus muscles were imbricated using 5-0 silk sutures.  These sutures were then clamped to the sterile drapes as bridle sutures.  A limbal peritomy was performed extending two clock hours and hemostasis was obtained with cautery.  A partial thickness scleral groove was made at the surgical limbus and dissected anteriorly in a lamellar dissection using an Alcon crescent knife.  The anterior chamber was entered superonasally with a Superblade and through the lamellar dissection with a 2.6 mm keratome.  DisCoVisc was used to replace the aqueous and a continuous tear capsulorrhexis was carried out.  Hydrodissection and hydrodelineation were carried out with balanced salt and a 27 gauge canula.  The nucleus was rotated to confirm the effectiveness of the hydrodissection.  Phacoemulsification was carried out using a divide-and-conquer technique.  Total ultrasound time was 1 minute and 43 seconds with an average power of 27.7 percent and CDE of 43.88.  Irrigation/aspiration was used to remove the residual cortex.  DisCoVisc was used to inflate the  capsule and the internal incision was enlarged to 3 mm with the crescent knife.  The intraocular lens was folded and inserted into the capsular bag using the AcrySert delivery system.  Irrigation/aspiration was used to remove the residual DisCoVisc.  Miostat was injected into the anterior chamber through the paracentesis track to inflate the anterior chamber and induce miosis. A tenth of a milliliter of cefuroxime containing 1 mg of drug was injected via the paracentesis tract. The wound was checked for leaks and wound leakage was found.  A single 10-0 suture was placed across the incision, tied and the knot was rotated superiorly.  The conjunctiva was closed with cautery and the bridle sutures were removed.  Two drops of 0.3% Vigamox were placed on the eye.   An eye shield was placed on the eye.  The patient was discharged to the recovery room in good condition.  ____________________________ Loura Back Ziara Thelander, MD sad:sb D: 03/16/2014 10:36:59 ET T: 03/16/2014 11:24:36 ET JOB#: [id number]  cc: Remo Lipps A. Darya Bigler, MD, <Dictator>  Martie Lee MD ELECTRONICALLY SIGNED 03/23/2014 13:17

## 2014-05-21 ENCOUNTER — Encounter (INDEPENDENT_AMBULATORY_CARE_PROVIDER_SITE_OTHER): Payer: Commercial Managed Care - HMO

## 2014-05-21 ENCOUNTER — Other Ambulatory Visit: Payer: Self-pay

## 2014-05-21 DIAGNOSIS — R55 Syncope and collapse: Secondary | ICD-10-CM

## 2014-06-02 ENCOUNTER — Telehealth: Payer: Self-pay | Admitting: *Deleted

## 2014-06-02 NOTE — Telephone Encounter (Signed)
Please see result note 

## 2014-06-02 NOTE — Telephone Encounter (Signed)
Patient returned nurses call

## 2014-06-08 ENCOUNTER — Telehealth: Payer: Self-pay

## 2014-06-08 NOTE — Telephone Encounter (Signed)
Pt called stating he had message to call the office.  Heather, RN had spoken with pt at the end of April with holter monitor results. Reviewed results with patient who indicated he was doing well with no concerns.

## 2014-11-25 ENCOUNTER — Telehealth: Payer: Self-pay | Admitting: Cardiology

## 2014-11-25 NOTE — Telephone Encounter (Signed)
3 attempts made to schedule fu from recall list.   LM with spouse  to call office he is not interested .   Deleting Recall.

## 2015-01-20 DIAGNOSIS — E1165 Type 2 diabetes mellitus with hyperglycemia: Secondary | ICD-10-CM | POA: Diagnosis not present

## 2015-01-20 DIAGNOSIS — E118 Type 2 diabetes mellitus with unspecified complications: Secondary | ICD-10-CM | POA: Diagnosis not present

## 2015-01-20 DIAGNOSIS — Z794 Long term (current) use of insulin: Secondary | ICD-10-CM | POA: Diagnosis not present

## 2015-01-21 DIAGNOSIS — H2512 Age-related nuclear cataract, left eye: Secondary | ICD-10-CM | POA: Diagnosis not present

## 2015-01-27 DIAGNOSIS — Z794 Long term (current) use of insulin: Secondary | ICD-10-CM | POA: Diagnosis not present

## 2015-01-27 DIAGNOSIS — E1165 Type 2 diabetes mellitus with hyperglycemia: Secondary | ICD-10-CM | POA: Diagnosis not present

## 2015-01-27 DIAGNOSIS — E118 Type 2 diabetes mellitus with unspecified complications: Secondary | ICD-10-CM | POA: Diagnosis not present

## 2015-02-26 DIAGNOSIS — F419 Anxiety disorder, unspecified: Secondary | ICD-10-CM | POA: Diagnosis not present

## 2015-02-26 DIAGNOSIS — I10 Essential (primary) hypertension: Secondary | ICD-10-CM | POA: Diagnosis not present

## 2015-02-26 DIAGNOSIS — E119 Type 2 diabetes mellitus without complications: Secondary | ICD-10-CM | POA: Diagnosis not present

## 2015-02-26 DIAGNOSIS — N179 Acute kidney failure, unspecified: Secondary | ICD-10-CM | POA: Diagnosis not present

## 2015-02-26 DIAGNOSIS — E7800 Pure hypercholesterolemia, unspecified: Secondary | ICD-10-CM | POA: Diagnosis not present

## 2015-02-26 DIAGNOSIS — Z794 Long term (current) use of insulin: Secondary | ICD-10-CM | POA: Diagnosis not present

## 2015-03-08 DIAGNOSIS — N289 Disorder of kidney and ureter, unspecified: Secondary | ICD-10-CM | POA: Diagnosis not present

## 2015-03-08 DIAGNOSIS — E875 Hyperkalemia: Secondary | ICD-10-CM | POA: Diagnosis not present

## 2015-03-30 DIAGNOSIS — E1165 Type 2 diabetes mellitus with hyperglycemia: Secondary | ICD-10-CM | POA: Diagnosis not present

## 2015-03-30 DIAGNOSIS — E118 Type 2 diabetes mellitus with unspecified complications: Secondary | ICD-10-CM | POA: Diagnosis not present

## 2015-03-30 DIAGNOSIS — Z79899 Other long term (current) drug therapy: Secondary | ICD-10-CM | POA: Diagnosis not present

## 2015-03-30 DIAGNOSIS — Z794 Long term (current) use of insulin: Secondary | ICD-10-CM | POA: Diagnosis not present

## 2015-06-22 DIAGNOSIS — E1165 Type 2 diabetes mellitus with hyperglycemia: Secondary | ICD-10-CM | POA: Diagnosis not present

## 2015-06-22 DIAGNOSIS — Z794 Long term (current) use of insulin: Secondary | ICD-10-CM | POA: Diagnosis not present

## 2015-06-22 DIAGNOSIS — E118 Type 2 diabetes mellitus with unspecified complications: Secondary | ICD-10-CM | POA: Diagnosis not present

## 2015-06-29 DIAGNOSIS — Z794 Long term (current) use of insulin: Secondary | ICD-10-CM | POA: Diagnosis not present

## 2015-06-29 DIAGNOSIS — E119 Type 2 diabetes mellitus without complications: Secondary | ICD-10-CM | POA: Diagnosis not present

## 2015-06-29 DIAGNOSIS — E1165 Type 2 diabetes mellitus with hyperglycemia: Secondary | ICD-10-CM | POA: Diagnosis not present

## 2015-06-29 DIAGNOSIS — R079 Chest pain, unspecified: Secondary | ICD-10-CM | POA: Diagnosis not present

## 2015-06-29 DIAGNOSIS — E1142 Type 2 diabetes mellitus with diabetic polyneuropathy: Secondary | ICD-10-CM | POA: Diagnosis not present

## 2015-06-29 DIAGNOSIS — Z79899 Other long term (current) drug therapy: Secondary | ICD-10-CM | POA: Diagnosis not present

## 2015-06-29 DIAGNOSIS — E7800 Pure hypercholesterolemia, unspecified: Secondary | ICD-10-CM | POA: Diagnosis not present

## 2015-06-29 DIAGNOSIS — I10 Essential (primary) hypertension: Secondary | ICD-10-CM | POA: Diagnosis not present

## 2015-06-30 DIAGNOSIS — Z79899 Other long term (current) drug therapy: Secondary | ICD-10-CM | POA: Diagnosis not present

## 2015-06-30 DIAGNOSIS — E7800 Pure hypercholesterolemia, unspecified: Secondary | ICD-10-CM | POA: Diagnosis not present

## 2015-07-01 DIAGNOSIS — D638 Anemia in other chronic diseases classified elsewhere: Secondary | ICD-10-CM

## 2015-07-01 HISTORY — DX: Anemia in other chronic diseases classified elsewhere: D63.8

## 2015-07-02 DIAGNOSIS — I10 Essential (primary) hypertension: Secondary | ICD-10-CM | POA: Diagnosis not present

## 2015-07-02 DIAGNOSIS — E119 Type 2 diabetes mellitus without complications: Secondary | ICD-10-CM | POA: Diagnosis not present

## 2015-07-02 DIAGNOSIS — R079 Chest pain, unspecified: Secondary | ICD-10-CM | POA: Diagnosis not present

## 2015-07-02 DIAGNOSIS — Z794 Long term (current) use of insulin: Secondary | ICD-10-CM | POA: Diagnosis not present

## 2015-07-02 DIAGNOSIS — I200 Unstable angina: Secondary | ICD-10-CM | POA: Diagnosis not present

## 2015-07-02 DIAGNOSIS — E7800 Pure hypercholesterolemia, unspecified: Secondary | ICD-10-CM | POA: Diagnosis not present

## 2015-07-05 ENCOUNTER — Telehealth: Payer: Self-pay | Admitting: Cardiology

## 2015-07-05 NOTE — Telephone Encounter (Signed)
Pt wife calling stating pt would like to just go along with Dr. Saralyn Pilar  He does not want to stop mid way with them She is appreciative but they will do the cath on 07/07/15

## 2015-07-05 NOTE — Telephone Encounter (Signed)
S/w pt wife who reports he had GXT Friday, June 16 at [facility].  She states he was told it was abnormal and saw Dr. Saralyn Pilar the same day who recommended cardiac cath June 21. Wife states if he does need cath, they would like Dr. Ellyn Hack to do this.  She reports pt SOB and tired for the past month. He last saw Dr. Ellyn Hack March 2016.  They would like Dr. Ellyn Hack to review and advise. Advised wife we will request GXT results, forward to MD for review and call her back. She is agreeable w/plan and appreciative of the call.

## 2015-07-05 NOTE — Telephone Encounter (Signed)
Noted  

## 2015-07-05 NOTE — Telephone Encounter (Signed)
Pt wife calling stating Friday patient went and did a stress test with PCP (in [facility]) They told him it wasn't good and now he needs to have a Cath this Wednesday 07/07/15 with one of there cardiologist.  Also she is not sure why patient set it up with [facility] when he see's [place] he is confused she stated.  Would like to know if Dr Ellyn Hack can do cath for patient if he truly needs it. Would like [place] to look at those results on (CE) and let them know.  Please advise as soon as we can so they can make arrangements.

## 2015-07-05 NOTE — Telephone Encounter (Signed)
[place] GXT reviewed by Christell Faith, PA-C. Per verbal from [place], he recommends myoview on Thursday and f/u w/[place] or PA. 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  Reviewed Ryan's recommendations w/Becki who verbalized understanding. She will discuss with pt and call back with decision.

## 2015-07-05 NOTE — Telephone Encounter (Signed)
Christell Faith, PA-C made aware of pt decision

## 2015-07-07 ENCOUNTER — Ambulatory Visit
Admission: RE | Admit: 2015-07-07 | Discharge: 2015-07-07 | Disposition: A | Discharge status: Home / Self Care | Payer: PPO | Source: Ambulatory Visit | Attending: Cardiology | Admitting: Cardiology

## 2015-07-07 ENCOUNTER — Encounter: Payer: Self-pay | Admitting: *Deleted

## 2015-07-07 ENCOUNTER — Encounter
Admission: RE | Disposition: A | Discharge status: Home / Self Care | Payer: Self-pay | Source: Ambulatory Visit | Attending: Cardiology

## 2015-07-07 DIAGNOSIS — Z8249 Family history of ischemic heart disease and other diseases of the circulatory system: Secondary | ICD-10-CM | POA: Insufficient documentation

## 2015-07-07 DIAGNOSIS — I495 Sick sinus syndrome: Secondary | ICD-10-CM | POA: Diagnosis not present

## 2015-07-07 DIAGNOSIS — R001 Bradycardia, unspecified: Secondary | ICD-10-CM | POA: Diagnosis not present

## 2015-07-07 DIAGNOSIS — Z801 Family history of malignant neoplasm of trachea, bronchus and lung: Secondary | ICD-10-CM | POA: Insufficient documentation

## 2015-07-07 DIAGNOSIS — Z7982 Long term (current) use of aspirin: Secondary | ICD-10-CM | POA: Insufficient documentation

## 2015-07-07 DIAGNOSIS — M810 Age-related osteoporosis without current pathological fracture: Secondary | ICD-10-CM | POA: Insufficient documentation

## 2015-07-07 DIAGNOSIS — F419 Anxiety disorder, unspecified: Secondary | ICD-10-CM | POA: Insufficient documentation

## 2015-07-07 DIAGNOSIS — Z79891 Long term (current) use of opiate analgesic: Secondary | ICD-10-CM | POA: Diagnosis not present

## 2015-07-07 DIAGNOSIS — D649 Anemia, unspecified: Secondary | ICD-10-CM | POA: Diagnosis not present

## 2015-07-07 DIAGNOSIS — E7800 Pure hypercholesterolemia, unspecified: Secondary | ICD-10-CM | POA: Insufficient documentation

## 2015-07-07 DIAGNOSIS — I10 Essential (primary) hypertension: Secondary | ICD-10-CM | POA: Insufficient documentation

## 2015-07-07 DIAGNOSIS — E669 Obesity, unspecified: Secondary | ICD-10-CM | POA: Insufficient documentation

## 2015-07-07 DIAGNOSIS — I25110 Atherosclerotic heart disease of native coronary artery with unstable angina pectoris: Secondary | ICD-10-CM | POA: Insufficient documentation

## 2015-07-07 DIAGNOSIS — Z888 Allergy status to other drugs, medicaments and biological substances status: Secondary | ICD-10-CM | POA: Diagnosis not present

## 2015-07-07 DIAGNOSIS — I2582 Chronic total occlusion of coronary artery: Secondary | ICD-10-CM | POA: Diagnosis not present

## 2015-07-07 DIAGNOSIS — E119 Type 2 diabetes mellitus without complications: Secondary | ICD-10-CM | POA: Diagnosis not present

## 2015-07-07 DIAGNOSIS — R079 Chest pain, unspecified: Secondary | ICD-10-CM | POA: Diagnosis not present

## 2015-07-07 DIAGNOSIS — Z87891 Personal history of nicotine dependence: Secondary | ICD-10-CM | POA: Insufficient documentation

## 2015-07-07 DIAGNOSIS — Z794 Long term (current) use of insulin: Secondary | ICD-10-CM | POA: Diagnosis not present

## 2015-07-07 DIAGNOSIS — M1990 Unspecified osteoarthritis, unspecified site: Secondary | ICD-10-CM | POA: Insufficient documentation

## 2015-07-07 DIAGNOSIS — Z79899 Other long term (current) drug therapy: Secondary | ICD-10-CM | POA: Diagnosis not present

## 2015-07-07 DIAGNOSIS — Z803 Family history of malignant neoplasm of breast: Secondary | ICD-10-CM | POA: Insufficient documentation

## 2015-07-07 DIAGNOSIS — E785 Hyperlipidemia, unspecified: Secondary | ICD-10-CM | POA: Insufficient documentation

## 2015-07-07 DIAGNOSIS — Z9889 Other specified postprocedural states: Secondary | ICD-10-CM | POA: Insufficient documentation

## 2015-07-07 DIAGNOSIS — R9430 Abnormal result of cardiovascular function study, unspecified: Secondary | ICD-10-CM | POA: Diagnosis not present

## 2015-07-07 HISTORY — DX: Reserved for inherently not codable concepts without codable children: IMO0001

## 2015-07-07 HISTORY — PX: CARDIAC CATHETERIZATION: SHX172

## 2015-07-07 LAB — GLUCOSE, CAPILLARY: Glucose-Capillary: 178 mg/dL — ABNORMAL HIGH (ref 65–99)

## 2015-07-07 SURGERY — LEFT HEART CATH AND CORONARY ANGIOGRAPHY
Anesthesia: Moderate Sedation | Laterality: Left

## 2015-07-07 MED ORDER — FENTANYL CITRATE (PF) 100 MCG/2ML IJ SOLN
INTRAMUSCULAR | Status: AC
  Filled 2015-07-07: qty 2

## 2015-07-07 MED ORDER — SODIUM CHLORIDE 0.9% FLUSH: 3.0000 mL | INTRAVENOUS | Status: DC | PRN

## 2015-07-07 MED ORDER — MIDAZOLAM HCL 2 MG/2ML IJ SOLN
INTRAMUSCULAR | Status: AC
  Filled 2015-07-07: qty 2

## 2015-07-07 MED ORDER — ASPIRIN 81 MG PO CHEW
CHEWABLE_TABLET | ORAL | Status: AC
  Filled 2015-07-07: qty 1

## 2015-07-07 MED ORDER — SODIUM CHLORIDE 0.9 % WEIGHT BASED INFUSION: 1.0000 mL/kg/h | INTRAVENOUS | Status: DC

## 2015-07-07 MED ORDER — IOPAMIDOL (ISOVUE-300) INJECTION 61%
INTRAVENOUS | Status: DC | PRN
  Administered 2015-07-07: 100 mL via INTRA_ARTERIAL

## 2015-07-07 MED ORDER — SODIUM CHLORIDE 0.9 % WEIGHT BASED INFUSION: 3.0000 mL/kg/h | INTRAVENOUS | Status: DC

## 2015-07-07 MED ORDER — FENTANYL CITRATE (PF) 100 MCG/2ML IJ SOLN
INTRAMUSCULAR | Status: DC | PRN
  Administered 2015-07-07: 50 ug via INTRAVENOUS

## 2015-07-07 MED ORDER — SODIUM CHLORIDE 0.9 % IV SOLN: 250.0000 mL | INTRAVENOUS | Status: DC | PRN

## 2015-07-07 MED ORDER — SODIUM CHLORIDE 0.9% FLUSH: 3.0000 mL | Freq: Two times a day (BID) | INTRAVENOUS | Status: DC

## 2015-07-07 MED ORDER — ASPIRIN 81 MG PO CHEW: 81.0000 mg | CHEWABLE_TABLET | ORAL | Status: DC

## 2015-07-07 MED ORDER — SODIUM CHLORIDE 0.9 % IV SOLN
INTRAVENOUS | Status: DC
  Administered 2015-07-07: 07:00:00 via INTRAVENOUS

## 2015-07-07 MED ORDER — HEPARIN (PORCINE) IN NACL 2-0.9 UNIT/ML-% IJ SOLN
INTRAMUSCULAR | Status: AC
  Filled 2015-07-07: qty 500

## 2015-07-07 MED ORDER — MIDAZOLAM HCL 2 MG/2ML IJ SOLN
INTRAMUSCULAR | Status: DC | PRN
  Administered 2015-07-07: 1 mg via INTRAVENOUS

## 2015-07-07 SURGICAL SUPPLY — 8 items
CATH INFINITI 5FR ANG PIGTAIL (CATHETERS) ×2 IMPLANT
CATH INFINITI 5FR JL4 (CATHETERS) ×2 IMPLANT
CATH INFINITI JR4 5F (CATHETERS) ×2 IMPLANT
KIT MANI 3VAL PERCEP (MISCELLANEOUS) ×2 IMPLANT
NEEDLE PERC 18GX7CM (NEEDLE) ×2 IMPLANT
PACK CARDIAC CATH (CUSTOM PROCEDURE TRAY) ×2 IMPLANT
SHEATH AVANTI 5FR X 11CM (SHEATH) ×2 IMPLANT
WIRE EMERALD 3MM-J .035X150CM (WIRE) ×2 IMPLANT

## 2015-07-07 NOTE — OR Nursing (Signed)
Patient and family refused 12 lead EKG. Stated he had one within the last 2 weeks

## 2015-07-07 NOTE — Discharge Instructions (Signed)
Angiogram, Care After °Refer to this sheet in the next few weeks. These instructions provide you with information about caring for yourself after your procedure. Your health care provider may also give you more specific instructions. Your treatment has been planned according to current medical practices, but problems sometimes occur. Call your health care provider if you have any problems or questions after your procedure. °WHAT TO EXPECT AFTER THE PROCEDURE °After your procedure, it is typical to have the following: °· Bruising at the catheter insertion site that usually fades within 1-2 weeks. °· Blood collecting in the tissue (hematoma) that may be painful to the touch. It should usually decrease in size and tenderness within 1-2 weeks. °HOME CARE INSTRUCTIONS °· Take medicines only as directed by your health care provider. °· You may shower 24-48 hours after the procedure or as directed by your health care provider. Remove the bandage (dressing) and gently wash the site with plain soap and water. Pat the area dry with a clean towel. Do not rub the site, because this may cause bleeding. °· Do not take baths, swim, or use a hot tub until your health care provider approves. °· Check your insertion site every day for redness, swelling, or drainage. °· Do not apply powder or lotion to the site. °· Do not lift over 10 lb (4.5 kg) for 5 days after your procedure or as directed by your health care provider. °· Ask your health care provider when it is okay to: °¨ Return to work or school. °¨ Resume usual physical activities or sports. °¨ Resume sexual activity. °· Do not drive home if you are discharged the same day as the procedure. Have someone else drive you. °· You may drive 24 hours after the procedure unless otherwise instructed by your health care provider. °· Do not operate machinery or power tools for 24 hours after the procedure or as directed by your health care provider. °· If your procedure was done as an  outpatient procedure, which means that you went home the same day as your procedure, a responsible adult should be with you for the first 24 hours after you arrive home. °· Keep all follow-up visits as directed by your health care provider. This is important. °SEEK MEDICAL CARE IF: °· You have a fever. °· You have chills. °· You have increased bleeding from the catheter insertion site. Hold pressure on the site. °SEEK IMMEDIATE MEDICAL CARE IF: °· You have unusual pain at the catheter insertion site. °· You have redness, warmth, or swelling at the catheter insertion site. °· You have drainage (other than a small amount of blood on the dressing) from the catheter insertion site. °· The catheter insertion site is bleeding, and the bleeding does not stop after 30 minutes of holding steady pressure on the site. °· The area near or just beyond the catheter insertion site becomes pale, cool, tingly, or numb. °  °This information is not intended to replace advice given to you by your health care provider. Make sure you discuss any questions you have with your health care provider. °  °Document Released: 07/21/2004 Document Revised: 01/23/2014 Document Reviewed: 06/05/2012 °Elsevier Interactive Patient Education ©2016 [employer] ° °

## 2015-07-14 DIAGNOSIS — R001 Bradycardia, unspecified: Secondary | ICD-10-CM | POA: Diagnosis not present

## 2015-07-14 DIAGNOSIS — I200 Unstable angina: Secondary | ICD-10-CM | POA: Diagnosis not present

## 2015-07-14 DIAGNOSIS — I10 Essential (primary) hypertension: Secondary | ICD-10-CM | POA: Diagnosis not present

## 2015-07-22 ENCOUNTER — Ambulatory Visit (INDEPENDENT_AMBULATORY_CARE_PROVIDER_SITE_OTHER): Payer: PPO | Admitting: Nurse Practitioner

## 2015-07-22 ENCOUNTER — Encounter: Payer: Self-pay | Admitting: Nurse Practitioner

## 2015-07-22 VITALS — BP 120/60 | HR 60 | Ht 68.0 in | Wt 196.5 lb

## 2015-07-22 DIAGNOSIS — I10 Essential (primary) hypertension: Secondary | ICD-10-CM | POA: Diagnosis not present

## 2015-07-22 DIAGNOSIS — E785 Hyperlipidemia, unspecified: Secondary | ICD-10-CM | POA: Diagnosis not present

## 2015-07-22 DIAGNOSIS — I2510 Atherosclerotic heart disease of native coronary artery without angina pectoris: Secondary | ICD-10-CM | POA: Insufficient documentation

## 2015-07-22 DIAGNOSIS — H2512 Age-related nuclear cataract, left eye: Secondary | ICD-10-CM | POA: Diagnosis not present

## 2015-07-22 DIAGNOSIS — I25119 Atherosclerotic heart disease of native coronary artery with unspecified angina pectoris: Secondary | ICD-10-CM

## 2015-07-22 MED ORDER — ISOSORBIDE MONONITRATE ER 30 MG PO TB24: 15.0000 mg | ORAL_TABLET | Freq: Every day | ORAL | Status: DC

## 2015-07-22 NOTE — Patient Instructions (Signed)
Medication Instructions:  Please START Imdur 30 mg 1/2 tab once daily  Labwork: None  Testing/Procedures: None  Follow-Up: 2 months w/ Dr. Rockey Situ  Date & time: ____________________  If you need a refill on your cardiac medications before your next appointment, please call your pharmacy.

## 2015-07-22 NOTE — Progress Notes (Signed)
Office Visit    Patient Name: Ronald Wallace Date of Encounter: 07/22/2015  Primary Care Provider:  [name], MD Primary Cardiologist:  Prev seen by D. Ellyn Hack, MD - will f/u with Johnny Bridge, MD   Chief Complaint    75 y/o ? with a prior h/o syncope, HTN, HL, and DM, who presents for f/u after recent cath.  Past Medical History    Past Medical History  Diagnosis Date  . Syncope and collapse     a. 02/2014 Carotid Dopplers: 1-39% bilateral ICA with normal Subclavian & vertebral arteries; b. 02/2014 2 D Echo: EF 50-55%, Gr 1 DD, Mild MR & LA dilation -- was bradycardic in 40s.  . Essential hypertension   . Hyperlipidemia   . Diabetes mellitus, type II, insulin dependent ([place])     With neuropathy  . CAD (coronary artery disease)     a. 02/2014 Neg Ex MV, EF 65%;  b. 06/2015 Abnl MV ([facility])-->Cath ([facility]): LM nl, LAD 30p/m, LCX 30p, OM2 50p, RCA 100p w/ L-->R collats, nl EF-->Med Rx.   Past Surgical History  Procedure Laterality Date  . Carpal tunnel release    . Cataract extraction Right   . Transthoracic echocardiogram  03/12/2014    EF 50-55%, Gr 1 DD, no RWMA, Mlid MR & LA dil, Normla RV and bradycardic 40s -  . Nm myoview ltd  03/13/2014    LOW RISK - positive EKG but negative imaging: 2 mm ST Depression in V4 & V5 but No scintigraphic evidence of Ischemia or Infarct o  . Cardiac catheterization Left 07/07/2015    Procedure: Left Heart Cath and Coronary Angiography;  Surgeon: Isaias Cowman, MD;  Location: [place] [facility];  Service: Cardiovascular;  Laterality: Left;    Allergies  Allergies  Allergen Reactions  . Amlodipine     Other reaction(s): Other (See Comments) Peripheral edema    History of Present Illness    75 y/o ? with a h/o syncope, HTN, HL, and DM.  He was recently seen by his PCP for increasing dyspnea and underwent stress testing @ [facility], which was abnl.  He was seen by cardiology and subsequently underwent cath in late  June revealing moderate LAD and LCX/OM dzs with a total occlusion of the prox RCA.  The distal RCA was fed by L  R collaterals.  LV fxn was nl.  He was Rx imdur 30 daily, but hasn't started yet.  Since his cath, he thinks his DOE has improved some.  He denies chest pain, palpitations, pnd, orthopnea, n, v, dizziness, syncope, edema, weight gain, or early satiety.   Home Medications    Prior to Admission medications   Medication Sig Start Date End Date Taking? Authorizing Provider  aspirin 81 MG tablet Take 81 mg by mouth daily.   Yes Historical Provider, MD  calcium carbonate (OS-CAL) 600 MG TABS tablet Take 600 mg by mouth daily with breakfast.   Yes Historical Provider, MD  cholecalciferol (VITAMIN D) 1000 UNITS tablet Take 1,000 Units by mouth daily.   Yes Historical Provider, MD  Ferrous Sulfate (IRON) 325 (65 FE) MG TABS Take 1 tablet by mouth daily.    Yes Historical Provider, MD  gabapentin (NEURONTIN) 300 MG capsule Take 600 mg by mouth at bedtime.   Yes Historical Provider, MD  hydrochlorothiazide (MICROZIDE) 12.5 MG capsule Take 12.5 mg by mouth 2 (two) times daily.    Yes Historical Provider, MD  isosorbide mononitrate (IMDUR) 30 MG  24 hr tablet Take 0.5 tablets (15 mg total) by mouth daily. 07/22/15  Yes Rogelia Mire, NP  LANTUS 100 UNIT/ML injection Inject 38 Units into the skin daily.  06/19/15  Yes Historical Provider, MD  lisinopril (PRINIVIL,ZESTRIL) 20 MG tablet Take 20 mg by mouth daily.   Yes Historical Provider, MD  metFORMIN (GLUCOPHAGE) 500 MG tablet Take 250 mg by mouth 2 (two) times daily with a meal.   Yes Historical Provider, MD  NOVOLOG 100 UNIT/ML injection Inject into the skin 3 (three) times daily with meals. 10 units am, 12 units lunch, 10 units supper 05/06/15  Yes Historical Provider, MD  PARoxetine (PAXIL) 30 MG tablet Take 30 mg by mouth daily.   Yes Historical Provider, MD  simvastatin (ZOCOR) 20 MG tablet Take 20 mg by mouth daily.   Yes Historical Provider,  MD  tamsulosin (FLOMAX) 0.4 MG CAPS capsule Take 0.4 mg by mouth daily.   Yes Historical Provider, MD    Review of Systems    Improved DOE.  He denies chest pain, palpitations, pnd, orthopnea, n, v, dizziness, syncope, edema, weight gain, or early satiety.  All other systems reviewed and are otherwise negative except as noted above.  Physical Exam    VS:  BP 120/60 mmHg  Pulse 60  Ht 5\' 8"  (1.727 m)  Wt 196 lb 8 oz (89.132 kg)  BMI 29.88 kg/m2  SpO2 97% , BMI Body mass index is 29.88 kg/(m^2). GEN: Well nourished, well developed, in no acute distress. HEENT: normal. Neck: Supple, no JVD, carotid bruits, or masses. Cardiac: RRR, no murmurs, rubs, or gallops. No clubbing, cyanosis, edema.  Radials/DP/PT 2+ and equal bilaterally.  Respiratory:  Respirations regular and unlabored, clear to auscultation bilaterally. GI: Soft, nontender, nondistended, BS + x 4. MS: no deformity or atrophy. Skin: warm and dry, no rash. Neuro:  Strength and sensation are intact. Psych: Normal affect.  Accessory Clinical Findings    Recent cath report reviewed with pt and family.  Assessment & Plan    1.  DOE/CAD:  Pt recently noted increasing DOE and had an abnl stress test followed by cath revealing an occluded prox RCA with L  R collaterals.  He also had moderate LAD dzs and mild LCX/OM dzs.  Med Rx was recommended and he was placed on imdur 30 but hasn't started taking yet.  We discussed his cath report in detail and I answered all questions posed by his wife and dtr.  He still has some DOE but it is improved since his cath.  I recommended that he begin imdur but instead of 30 mg, I rec that he start a 1/2 tab daily.  He is agreeable to do this.  He remains on ASA and statin therapy and his LDL was 66 earlier this year.  2.  Essential HTN:  Stable on acei.  3. HL:  LDL 66, cont simvastatin.  4.  DM II:  On insulin.  Followed closely by primary care.  5.  Dispo: f/u in 3 mos or sooner if  necessary.   Murray Hodgkins, NP 07/22/2015, 3:46 PM

## 2015-08-11 DIAGNOSIS — Z79899 Other long term (current) drug therapy: Secondary | ICD-10-CM | POA: Diagnosis not present

## 2015-08-11 DIAGNOSIS — Z794 Long term (current) use of insulin: Secondary | ICD-10-CM | POA: Diagnosis not present

## 2015-08-11 DIAGNOSIS — E1165 Type 2 diabetes mellitus with hyperglycemia: Secondary | ICD-10-CM | POA: Diagnosis not present

## 2015-08-11 DIAGNOSIS — E162 Hypoglycemia, unspecified: Secondary | ICD-10-CM | POA: Diagnosis not present

## 2015-08-11 DIAGNOSIS — E1142 Type 2 diabetes mellitus with diabetic polyneuropathy: Secondary | ICD-10-CM | POA: Diagnosis not present

## 2015-08-24 ENCOUNTER — Telehealth: Payer: Self-pay | Admitting: Cardiovascular Disease

## 2015-08-24 MED ORDER — ISOSORBIDE MONONITRATE ER 30 MG PO TB24: 15.0000 mg | ORAL_TABLET | Freq: Every day | ORAL | 6 refills | Status: DC

## 2015-08-24 NOTE — Telephone Encounter (Signed)
Pt wife calling not sure what dose patient is on   *STAT* If patient is at the pharmacy, call can be transferred to refill team.   1. Which medications need to be refilled? (please list name of each medication and dose if known) Isosorbide   2. Which pharmacy/location (including street and city if local pharmacy) is medication to be sent to? [facility] on [address]   3. Do they need a 30 day or 90 day supply? 90 day

## 2015-09-27 ENCOUNTER — Encounter: Payer: Self-pay | Admitting: Cardiovascular Disease

## 2015-09-27 ENCOUNTER — Ambulatory Visit (INDEPENDENT_AMBULATORY_CARE_PROVIDER_SITE_OTHER): Payer: PPO | Admitting: Cardiovascular Disease

## 2015-09-27 VITALS — BP 122/60 | HR 56 | Ht 63.0 in | Wt 196.0 lb

## 2015-09-27 DIAGNOSIS — N183 Chronic kidney disease, stage 3 unspecified: Secondary | ICD-10-CM | POA: Insufficient documentation

## 2015-09-27 DIAGNOSIS — E785 Hyperlipidemia, unspecified: Secondary | ICD-10-CM

## 2015-09-27 DIAGNOSIS — I10 Essential (primary) hypertension: Secondary | ICD-10-CM

## 2015-09-27 DIAGNOSIS — Z794 Long term (current) use of insulin: Secondary | ICD-10-CM

## 2015-09-27 DIAGNOSIS — E119 Type 2 diabetes mellitus without complications: Secondary | ICD-10-CM

## 2015-09-27 DIAGNOSIS — I2510 Atherosclerotic heart disease of native coronary artery without angina pectoris: Secondary | ICD-10-CM | POA: Diagnosis not present

## 2015-09-27 DIAGNOSIS — R0600 Dyspnea, unspecified: Secondary | ICD-10-CM

## 2015-09-27 DIAGNOSIS — N189 Chronic kidney disease, unspecified: Secondary | ICD-10-CM | POA: Insufficient documentation

## 2015-09-27 DIAGNOSIS — R0609 Other forms of dyspnea: Secondary | ICD-10-CM

## 2015-09-27 HISTORY — DX: Chronic kidney disease, stage 3 unspecified: N18.30

## 2015-09-27 MED ORDER — ISOSORBIDE MONONITRATE ER 30 MG PO TB24: 30.0000 mg | ORAL_TABLET | Freq: Every day | ORAL | 3 refills | Status: DC

## 2015-09-27 NOTE — Patient Instructions (Addendum)
Medication Instructions:   Watch the carbos! Go to the [facility]!  Labwork:  No new labs needed  Testing/Procedures:  No further testing at this time   Follow-Up: It was a pleasure seeing you in the office today. Please call us if you have new issues that need to be addressed before your next appt.  [phone]  Your physician wants you to follow-up in: 6 months.  You will receive a reminder letter in the mail two months in advance. If you don't receive a letter, please call our office to schedule the follow-up appointment.  If you need a refill on your cardiac medications before your next appointment, please call your pharmacy.

## 2015-09-27 NOTE — Progress Notes (Signed)
Cardiology Office Note  Date:  09/27/2015   ID:  Ronald Wallace, DOB 01-19-1940, MRN [id number]  PCP:  [name], MD   Chief Complaint  Patient presents with  . Other    2 month f/u c/o sob. Meds reviewed verbally with pt.    HPI:  75 y/o Male  with a prior h/o syncope, HTN, HL, and DM, remote smoking seen by his PCP for increasing dyspnea and underwent stress testing @ [facility], which was abnl,  underwent cath in late June revealing moderate LAD and LCX/OM dzs with a total occlusion of the prox RCA,  L  R collaterals.  LV fxn was nl, who presents for routine follow-up of his coronary artery disease  In follow-up, he reports that he is doing well. He's been taking isosorbide one half pill per day, 15 mg. Reports blood pressure stable.  he thinks his DOE has improved.  Active with no regular exercise program .   He denies chest pain, palpitations, pnd, orthopnea, n, v, dizziness, syncope, edema, weight gain, or early satiety.   Details of his catheterization discussed with him  Lab work reviewed with him Total chol 130 LDL 66   EKG not performed on today's visit  Other past medical history 02/2014 Carotid Dopplers: 1-39% bilateral ICA with normal Subclavian & vertebral arteries; b. 02/2014 2 D Echo: EF 50-55%, Gr 1 DD, Mild MR & LA dilation -- was bradycardic in 40s 02/2014 Neg Ex MV, EF 65%;  b. 06/2015 Abnl MV ([facility])-->Cath ([facility]): LM nl, LAD 30p/m, LCX 30p, OM2 50p, RCA 100p w/ L-->R collats, nl EF-->Med Rx.    PMH:   has a past medical history of CAD (coronary artery disease); Diabetes mellitus, type II, insulin dependent ([place]); Essential hypertension; Hyperlipidemia; and Syncope and collapse.  PSH:    Past Surgical History:  Procedure Laterality Date  . CARDIAC CATHETERIZATION Left 07/07/2015   Procedure: Left Heart Cath and Coronary Angiography;  Surgeon: Isaias Cowman, MD;  Location: [place] [facility];  Service: Cardiovascular;  Laterality:  Left;  . CARPAL TUNNEL RELEASE    . CATARACT EXTRACTION Right   . NM MYOVIEW LTD  03/13/2014   LOW RISK - positive EKG but negative imaging: 2 mm ST Depression in V4 & V5 but No scintigraphic evidence of Ischemia or Infarct o  . TRANSTHORACIC ECHOCARDIOGRAM  03/12/2014   EF 50-55%, Gr 1 DD, no RWMA, Mlid MR & LA dil, Normla RV and bradycardic 40s -    Current Outpatient Prescriptions  Medication Sig Dispense Refill  . calcium carbonate (OS-CAL) 600 MG TABS tablet Take 600 mg by mouth daily with breakfast.    . cholecalciferol (VITAMIN D) 1000 UNITS tablet Take 1,000 Units by mouth daily.    . Ferrous Sulfate (IRON) 325 (65 FE) MG TABS Take 1 tablet by mouth daily.     Marland Kitchen gabapentin (NEURONTIN) 300 MG capsule Take 600 mg by mouth at bedtime.    . hydrochlorothiazide (MICROZIDE) 12.5 MG capsule Take 12.5 mg by mouth 2 (two) times daily.     . isosorbide mononitrate (IMDUR) 30 MG 24 hr tablet Take 0.5 tablets (15 mg total) by mouth daily. 30 tablet 6  . LANTUS 100 UNIT/ML injection Inject 32 Units into the skin daily.     Marland Kitchen lisinopril (PRINIVIL,ZESTRIL) 20 MG tablet Take 20 mg by mouth daily.    . metFORMIN (GLUCOPHAGE) 500 MG tablet Take 250 mg by mouth 2 (two) times daily with a meal.    .  NOVOLOG 100 UNIT/ML injection Inject into the skin 3 (three) times daily with meals. 12 units am, 12 units lunch, 14 units supper    . PARoxetine (PAXIL) 30 MG tablet Take 30 mg by mouth daily.    . simvastatin (ZOCOR) 20 MG tablet Take 20 mg by mouth daily.    . tamsulosin (FLOMAX) 0.4 MG CAPS capsule Take 0.4 mg by mouth daily.    Marland Kitchen aspirin 81 MG tablet Take 81 mg by mouth daily.     No current facility-administered medications for this visit.      Allergies:   Amlodipine   Social History:  The patient  reports that he has never smoked. He has never used smokeless tobacco. He reports that he does not drink alcohol or use drugs.   Family History:   family history includes Heart attack in his mother.      Review of Systems: Review of Systems  Constitutional: Negative.   Respiratory: Negative.   Cardiovascular: Negative.   Gastrointestinal: Negative.   Musculoskeletal: Negative.   Neurological: Negative.   Psychiatric/Behavioral: Negative.   All other systems reviewed and are negative.    PHYSICAL EXAM: VS:  BP 122/60 (BP Location: Left Arm, Patient Position: Sitting, Cuff Size: Normal)   Pulse (!) 56   Ht 5\' 3"  (1.6 m)   Wt 196 lb (88.9 kg)   BMI 34.72 kg/m  , BMI Body mass index is 34.72 kg/m. GEN: Well nourished, well developed, in no acute distress  HEENT: normal  Neck: no JVD, carotid bruits, or masses Cardiac: RRR; no murmurs, rubs, or gallops,no edema  Respiratory:  clear to auscultation bilaterally, normal work of breathing GI: soft, nontender, nondistended, + BS MS: no deformity or atrophy  Skin: warm and dry, no rash Neuro:  Strength and sensation are intact Psych: euthymic mood, full affect   Recent Labs: No results found for requested labs within last 8760 hours.    Lipid Panel No results found for: CHOL, HDL, LDLCALC, TRIG    Wt Readings from Last 3 Encounters:  09/27/15 196 lb (88.9 kg)  07/22/15 196 lb 8 oz (89.1 kg)  07/07/15 198 lb (89.8 kg)       ASSESSMENT AND PLAN:  1.  CAD:   Currently with no symptoms of angina. No further workup at this time. Continue current medication regimen. Details of his recent catheterization discussed with him in detail Discussed anginal symptoms with him in preparation  2.  Essential HTN:   Blood pressure is well controlled on today's visit. No changes made to the medications. Tolerating half dose Imdur,norvasc,  triamterene HCTZ  3. HL:   LDL 66, cont simvastatin.  4.  DM II:   On insulin.   We have encouraged continued exercise, careful diet management in an effort to lose weight.  5. Chronic renal insufficiency Creatinine 1.4-1.5. Reviewed with him in detail Unclear if this is secondary to  HCTZ Encouraged increased fluids     Total encounter time more than 25 minutes  Greater than 50% was spent in counseling and coordination of care with the patient   Disposition:   F/U  12 months    Signed, Esmond Plants, M.D., Ph.D. 09/27/2015  [place], [place]

## 2015-09-30 DIAGNOSIS — H2512 Age-related nuclear cataract, left eye: Secondary | ICD-10-CM | POA: Diagnosis not present

## 2015-10-04 ENCOUNTER — Encounter: Payer: Self-pay | Admitting: *Deleted

## 2015-10-04 ENCOUNTER — Telehealth: Payer: Self-pay | Admitting: Cardiovascular Disease

## 2015-10-04 NOTE — Telephone Encounter (Signed)
Received cardiac clearance request for pt to proceed w/ eye surgery on 09/18/15 w/ Dr. Sandra Cockayne. Pt is sched for Cataract w/ IOL w/ IVA Block anesthesia. Please route clearance to [place] @ [phone].

## 2015-10-05 NOTE — Telephone Encounter (Signed)
Per Dr. Rockey Situ, pt is acceptable risk & cleared to proceed w/ surgery. No further cardiac testing is needed.  Routed to [phone].

## 2015-10-05 NOTE — H&P (Signed)
See scanned note.

## 2015-10-05 NOTE — Telephone Encounter (Signed)
[facility] must have cardiac clearance by 5pm today 10/05/15

## 2015-10-06 ENCOUNTER — Encounter: Payer: Self-pay | Admitting: *Deleted

## 2015-10-06 ENCOUNTER — Ambulatory Visit
Admission: RE | Admit: 2015-10-06 | Discharge: 2015-10-06 | Disposition: A | Discharge status: Home / Self Care | Payer: PPO | Source: Ambulatory Visit | Attending: Ophthalmology | Admitting: Ophthalmology

## 2015-10-06 ENCOUNTER — Encounter
Admission: RE | Disposition: A | Discharge status: Home / Self Care | Payer: Self-pay | Source: Ambulatory Visit | Attending: Ophthalmology

## 2015-10-06 ENCOUNTER — Ambulatory Visit: Payer: PPO | Admitting: Anesthesiology

## 2015-10-06 DIAGNOSIS — E7800 Pure hypercholesterolemia, unspecified: Secondary | ICD-10-CM | POA: Diagnosis not present

## 2015-10-06 DIAGNOSIS — E1140 Type 2 diabetes mellitus with diabetic neuropathy, unspecified: Secondary | ICD-10-CM | POA: Insufficient documentation

## 2015-10-06 DIAGNOSIS — H2512 Age-related nuclear cataract, left eye: Secondary | ICD-10-CM | POA: Diagnosis not present

## 2015-10-06 DIAGNOSIS — Z79899 Other long term (current) drug therapy: Secondary | ICD-10-CM | POA: Diagnosis not present

## 2015-10-06 DIAGNOSIS — Z794 Long term (current) use of insulin: Secondary | ICD-10-CM | POA: Insufficient documentation

## 2015-10-06 DIAGNOSIS — I10 Essential (primary) hypertension: Secondary | ICD-10-CM | POA: Insufficient documentation

## 2015-10-06 DIAGNOSIS — Z7982 Long term (current) use of aspirin: Secondary | ICD-10-CM | POA: Insufficient documentation

## 2015-10-06 DIAGNOSIS — Z9889 Other specified postprocedural states: Secondary | ICD-10-CM | POA: Diagnosis not present

## 2015-10-06 DIAGNOSIS — Z6828 Body mass index (BMI) 28.0-28.9, adult: Secondary | ICD-10-CM | POA: Diagnosis not present

## 2015-10-06 DIAGNOSIS — Z9841 Cataract extraction status, right eye: Secondary | ICD-10-CM | POA: Insufficient documentation

## 2015-10-06 DIAGNOSIS — D649 Anemia, unspecified: Secondary | ICD-10-CM | POA: Insufficient documentation

## 2015-10-06 DIAGNOSIS — E669 Obesity, unspecified: Secondary | ICD-10-CM | POA: Diagnosis not present

## 2015-10-06 HISTORY — PX: CATARACT EXTRACTION W/PHACO: SHX586

## 2015-10-06 HISTORY — DX: Polyneuropathy, unspecified: G62.9

## 2015-10-06 HISTORY — DX: Diverticulitis of intestine, part unspecified, without perforation or abscess without bleeding: K57.92

## 2015-10-06 HISTORY — DX: Anemia, unspecified: D64.9

## 2015-10-06 LAB — GLUCOSE, CAPILLARY: Glucose-Capillary: 105 mg/dL — ABNORMAL HIGH (ref 65–99)

## 2015-10-06 SURGERY — PHACOEMULSIFICATION, CATARACT, WITH IOL INSERTION
Anesthesia: Monitor Anesthesia Care | Site: Eye | Laterality: Left | Wound class: Clean

## 2015-10-06 MED ORDER — MOXIFLOXACIN HCL 0.5 % OP SOLN
OPHTHALMIC | Status: AC
Start: 2015-10-06 — End: 2015-10-06
  Administered 2015-10-06: 1 [drp] via OPHTHALMIC
  Filled 2015-10-06: qty 3

## 2015-10-06 MED ORDER — PHENYLEPHRINE HCL 10 % OP SOLN
OPHTHALMIC | Status: AC
  Administered 2015-10-06: 1 [drp] via OPHTHALMIC
  Filled 2015-10-06: qty 5

## 2015-10-06 MED ORDER — MOXIFLOXACIN HCL 0.5 % OP SOLN
1.0000 [drp] | OPHTHALMIC | Status: AC
  Administered 2015-10-06 (×3): 1 [drp] via OPHTHALMIC

## 2015-10-06 MED ORDER — BSS IO SOLN
INTRAOCULAR | Status: DC | PRN
  Administered 2015-10-06: .5 mL via OPHTHALMIC

## 2015-10-06 MED ORDER — CEFUROXIME OPHTHALMIC INJECTION 1 MG/0.1 ML
INJECTION | OPHTHALMIC | Status: DC | PRN
  Administered 2015-10-06: .1 mL via INTRACAMERAL

## 2015-10-06 MED ORDER — TETRACAINE HCL 0.5 % OP SOLN
OPHTHALMIC | Status: AC
  Filled 2015-10-06: qty 2

## 2015-10-06 MED ORDER — POVIDONE-IODINE 5 % OP SOLN
OPHTHALMIC | Status: DC | PRN
Start: 2015-10-06 — End: 2015-10-06
  Administered 2015-10-06: 1 via OPHTHALMIC

## 2015-10-06 MED ORDER — BUPIVACAINE HCL (PF) 0.75 % IJ SOLN
INTRAMUSCULAR | Status: AC
  Filled 2015-10-06: qty 10

## 2015-10-06 MED ORDER — CYCLOPENTOLATE HCL 2 % OP SOLN
1.0000 [drp] | OPHTHALMIC | Status: AC
  Administered 2015-10-06 (×3): 1 [drp] via OPHTHALMIC

## 2015-10-06 MED ORDER — MIDAZOLAM HCL 2 MG/2ML IJ SOLN
INTRAMUSCULAR | Status: DC | PRN
  Administered 2015-10-06: 1 mg via INTRAVENOUS

## 2015-10-06 MED ORDER — SODIUM CHLORIDE 0.9 % IV SOLN
INTRAVENOUS | Status: DC
  Administered 2015-10-06 (×2): via INTRAVENOUS

## 2015-10-06 MED ORDER — TETRACAINE HCL 0.5 % OP SOLN
OPHTHALMIC | Status: DC | PRN
  Administered 2015-10-06: 1 [drp] via OPHTHALMIC

## 2015-10-06 MED ORDER — LIDOCAINE HCL (PF) 4 % IJ SOLN
INTRAMUSCULAR | Status: DC | PRN
  Administered 2015-10-06: 4 mL via OPHTHALMIC

## 2015-10-06 MED ORDER — CARBACHOL 0.01 % IO SOLN
INTRAOCULAR | Status: DC | PRN
Start: 2015-10-06 — End: 2015-10-06
  Administered 2015-10-06: .5 mL via INTRAOCULAR

## 2015-10-06 MED ORDER — HYALURONIDASE HUMAN 150 UNIT/ML IJ SOLN
INTRAMUSCULAR | Status: AC
  Filled 2015-10-06: qty 1

## 2015-10-06 MED ORDER — POVIDONE-IODINE 5 % OP SOLN
OPHTHALMIC | Status: AC
  Filled 2015-10-06: qty 30

## 2015-10-06 MED ORDER — NA CHONDROIT SULF-NA HYALURON 40-17 MG/ML IO SOLN
INTRAOCULAR | Status: DC | PRN
Start: 2015-10-06 — End: 2015-10-06
  Administered 2015-10-06: 1 mL via INTRAOCULAR

## 2015-10-06 MED ORDER — EPINEPHRINE HCL 1 MG/ML IJ SOLN
INTRAMUSCULAR | Status: AC
  Filled 2015-10-06: qty 2

## 2015-10-06 MED ORDER — MOXIFLOXACIN HCL 0.5 % OP SOLN
OPHTHALMIC | Status: DC | PRN
Start: 2015-10-06 — End: 2015-10-06
  Administered 2015-10-06: 1 [drp] via OPHTHALMIC

## 2015-10-06 MED ORDER — ALFENTANIL 500 MCG/ML IJ INJ
INJECTION | INTRAMUSCULAR | Status: DC | PRN
  Administered 2015-10-06: 500 ug via INTRAVENOUS

## 2015-10-06 MED ORDER — ACETYLCHOLINE CHLORIDE 20 MG IO SOLR
INTRAOCULAR | Status: AC
  Filled 2015-10-06: qty 1

## 2015-10-06 MED ORDER — EPINEPHRINE HCL 1 MG/ML IJ SOLN
INTRAOCULAR | Status: DC | PRN
  Administered 2015-10-06: 1 mL via OPHTHALMIC

## 2015-10-06 MED ORDER — PHENYLEPHRINE HCL 10 % OP SOLN
1.0000 [drp] | OPHTHALMIC | Status: AC
  Administered 2015-10-06 (×3): 1 [drp] via OPHTHALMIC

## 2015-10-06 MED ORDER — LIDOCAINE HCL (PF) 4 % IJ SOLN
INTRAMUSCULAR | Status: AC
  Filled 2015-10-06: qty 5

## 2015-10-06 MED ORDER — CYCLOPENTOLATE HCL 2 % OP SOLN
OPHTHALMIC | Status: AC
  Administered 2015-10-06: 1 [drp] via OPHTHALMIC
  Filled 2015-10-06: qty 2

## 2015-10-06 MED ORDER — CEFUROXIME OPHTHALMIC INJECTION 1 MG/0.1 ML
INJECTION | OPHTHALMIC | Status: AC
  Filled 2015-10-06: qty 0.1

## 2015-10-06 SURGICAL SUPPLY — 29 items
CANNULA ANT/CHMB 27GA (MISCELLANEOUS) ×2 IMPLANT
CORD BIP STRL DISP 12FT (MISCELLANEOUS) ×2 IMPLANT
CUP MEDICINE 2OZ PLAST GRAD ST (MISCELLANEOUS) ×2 IMPLANT
DRAPE XRAY CASSETTE 23X24 (DRAPES) ×2 IMPLANT
ERASER HMR WETFIELD 18G (MISCELLANEOUS) ×2 IMPLANT
GLOVE BIO SURGEON STRL SZ8 (GLOVE) ×2 IMPLANT
GLOVE SURG LX 6.5 MICRO (GLOVE) ×1
GLOVE SURG LX 8.0 MICRO (GLOVE) ×1
GLOVE SURG LX STRL 6.5 MICRO (GLOVE) ×1 IMPLANT
GLOVE SURG LX STRL 8.0 MICRO (GLOVE) ×1 IMPLANT
GOWN STRL REUS W/ TWL LRG LVL3 (GOWN DISPOSABLE) ×1 IMPLANT
GOWN STRL REUS W/ TWL XL LVL3 (GOWN DISPOSABLE) ×1 IMPLANT
GOWN STRL REUS W/TWL LRG LVL3 (GOWN DISPOSABLE) ×1
GOWN STRL REUS W/TWL XL LVL3 (GOWN DISPOSABLE) ×1
LENS IOL ACRYSOF IQ 20.0 (Intraocular Lens) ×2 IMPLANT
PACK CATARACT (MISCELLANEOUS) ×2 IMPLANT
PACK CATARACT DINGLEDEIN LX (MISCELLANEOUS) ×2 IMPLANT
PACK EYE AFTER SURG (MISCELLANEOUS) ×2 IMPLANT
SHLD EYE VISITEC  UNIV (MISCELLANEOUS) ×2 IMPLANT
SOL BSS BAG (MISCELLANEOUS) ×2
SOL PREP PVP 2OZ (MISCELLANEOUS) ×2
SOLUTION BSS BAG (MISCELLANEOUS) ×1 IMPLANT
SOLUTION PREP PVP 2OZ (MISCELLANEOUS) ×1 IMPLANT
SUT SILK 5-0 (SUTURE) ×2 IMPLANT
SYR 3ML LL SCALE MARK (SYRINGE) ×2 IMPLANT
SYR 5ML LL (SYRINGE) ×2 IMPLANT
SYR TB 1ML 27GX1/2 LL (SYRINGE) ×2 IMPLANT
WATER STERILE IRR 250ML POUR (IV SOLUTION) ×2 IMPLANT
WIPE NON LINTING 3.25X3.25 (MISCELLANEOUS) ×2 IMPLANT

## 2015-10-06 NOTE — Transfer of Care (Signed)
Immediate Anesthesia Transfer of Care Note  Patient: Ronald Wallace  Procedure(s) Performed: Procedure(s) with comments: CATARACT EXTRACTION PHACO AND INTRAOCULAR LENS PLACEMENT (IOC) (Left) - [place] 01:42 AP% 25.1 CDE 45.92 Fluid pack lot # [id number]  Patient Location: PACU  Anesthesia Type:MAC  Level of Consciousness: awake, alert  and oriented  Airway & Oxygen Therapy: Patient Spontanous Breathing  Post-op Assessment: Report given to RN and Post -op Vital signs reviewed and stable  Post vital signs: Reviewed and stable  Last Vitals:  Vitals:   10/06/15 0617  BP: (!) 147/63  Pulse: (!) 42  Resp: 16  Temp: 36.5 C    Last Pain:  Vitals:   10/06/15 0617  TempSrc: Oral         Complications: No apparent anesthesia complications

## 2015-10-06 NOTE — Op Note (Signed)
Date of Surgery: 10/06/2015 Date of Dictation: 10/06/2015 8:12 AM Pre-operative Diagnosis:  Nuclear Sclerotic Cataract left Eye Post-operative Diagnosis: same Procedure performed: Extra-capsular Cataract Extraction (ECCE) with placement of a posterior chamber intraocular lens (IOL) left Eye IOL:  Implant Name Type Inv. Item Serial No. Manufacturer Lot No. LRB No. Used  LENS IOL ACRYSOF IQ 20.0 - [id number] Intraocular Lens LENS IOL ACRYSOF IQ 20.0 [id number] ALCON   Left 1   Anesthesia: 2% Lidocaine and 4% Marcaine in a 50/50 mixture with 10 unites/ml of Hylenex given as a peribulbar Anesthesiologist: Anesthesiologist: Gijsbertus Lonia Mad, MD CRNA: Marsh Dolly, CRNA Complications: none Estimated Blood Loss: less than 1 ml  Description of procedure:  The patient was given anesthesia and sedation via intravenous access. The patient was then prepped and draped in the usual fashion. A 25-gauge needle was bent for initiating the capsulorhexis. A 5-0 silk suture was placed through the conjunctiva superior and inferiorly to serve as bridle sutures. Hemostasis was obtained at the superior limbus using an eraser cautery. A partial thickness groove was made at the anterior surgical limbus with a 64 Beaver blade and this was dissected anteriorly with an Avaya. The anterior chamber was entered at 10 o'clock with a 1.0 mm paracentesis knife and through the lamellar dissection with a 2.6 mm Alcon keratome. Epi-Shugarcaine 0.5 CC [9 cc BSS Plus (Alcon), 3 cc 4% preservative-free lidocaine (Hospira) and 4 cc 1:1000 preservative-free, bisulfite-free epinephrine] was injected into the anterior chamber via the paracentesis tract. Epi-Shugarcaine 0.5 CC [9 cc BSS Plus (Alcon), 3 cc 4% preservative-free lidocaine (Hospira) and 4 cc 1:1000 preservative-free, bisulfite-free epinephrine] was injected into the anterior chamber via the paracentesis tract. DiscoVisc was injected to replace the aqueous  and a continuous tear curvilinear capsulorhexis was performed using a bent 25-gauge needle.  Balance salt on a syringe was used to perform hydro-dissection and phacoemulsification was carried out using a divide and conquer technique. Procedure(s) with comments: CATARACT EXTRACTION PHACO AND INTRAOCULAR LENS PLACEMENT (IOC) (Left) - [place] 01:42 AP% 25.1 CDE 45.92 Fluid pack lot # [id number]. Irrigation/aspiration was used to remove the residual cortex and the capsular bag was inflated with DiscoVisc. The intraocular lens was inserted into the capsular bag using a pre-loaded UltraSert Delivery System. Irrigation/aspiration was used to remove the residual DiscoVisc. The wound was inflated with balanced salt and checked for leaks. None were found. Miostat was injected via the paracentesis track and 0.1 ml of cefuroxime containing 1 mg of drug  was injected via the paracentesis track. The wound was checked for leaks again and none were found.   The bridal sutures were removed and two drops of Vigamox were placed on the eye. An eye shield was placed to protect the eye and the patient was discharged to the recovery area in good condition.   Melika Reder MD

## 2015-10-06 NOTE — Discharge Instructions (Signed)
Eye Surgery Discharge Instructions  Expect mild scratchy sensation or mild soreness. DO NOT RUB YOUR EYE!  The day of surgery:  Minimal physical activity, but bed rest is not required  No reading, computer work, or close hand work  No bending, lifting, or straining.  May watch TV  For 24 hours:  No driving, legal decisions, or alcoholic beverages  Safety precautions  Eat anything you prefer: It is better to start with liquids, then soup then solid foods.  _____ Eye patch should be worn until postoperative exam tomorrow.  ____ Solar shield eyeglasses should be worn for comfort in the sunlight/patch while sleeping  Resume all regular medications including aspirin or Coumadin if these were discontinued prior to surgery. You may shower, bathe, shave, or wash your hair. Tylenol may be taken for mild discomfort.  Call your doctor if you experience significant pain, nausea, or vomiting, fever > 101 or other signs of infection. [phone] or [phone] Specific instructions:  Follow-up Information    DINGELDEIN,STEVEN, MD .   Specialty:  Ophthalmology Why:  September 21 at 10:30am Contact information: [address]   [place] [address] [phone]

## 2015-10-06 NOTE — Interval H&P Note (Signed)
History and Physical Interval Note:  10/06/2015 7:30 AM  Ronald Wallace  has presented today for surgery, with the diagnosis of nuclear sclerotic cataract left eye  The various methods of treatment have been discussed with the patient and family. After consideration of risks, benefits and other options for treatment, the patient has consented to  Procedure(s) with comments: CATARACT EXTRACTION PHACO AND INTRAOCULAR LENS PLACEMENT (IOC) (Left) - [place] AP% CDE Fluid pack lot # [id number] as a surgical intervention .  The patient's history has been reviewed, patient examined, no change in status, stable for surgery.  I have reviewed the patient's chart and labs.  Questions were answered to the patient's satisfaction.     Irving Lubbers

## 2015-10-06 NOTE — Anesthesia Postprocedure Evaluation (Signed)
Anesthesia Post Note  Patient: Ronald Wallace  Procedure(s) Performed: Procedure(s) (LRB): CATARACT EXTRACTION PHACO AND INTRAOCULAR LENS PLACEMENT (IOC) (Left)  Patient location during evaluation: PACU Anesthesia Type: MAC Level of consciousness: awake Pain management: pain level controlled Vital Signs Assessment: post-procedure vital signs reviewed and stable Respiratory status: spontaneous breathing Cardiovascular status: stable Anesthetic complications: no    Last Vitals:  Vitals:   10/06/15 0813 10/06/15 0823  BP: 140/65 (!) 157/71  Pulse: (!) 57   Resp: 16   Temp:      Last Pain:  Vitals:   10/06/15 0617  TempSrc: Oral                 VAN STAVEREN,Bonham Zingale

## 2015-10-06 NOTE — Anesthesia Preprocedure Evaluation (Signed)
Anesthesia Evaluation  Patient identified by MRN, date of birth, ID band Patient awake    Reviewed: Allergy & Precautions, NPO status , Patient's Chart, lab work & pertinent test results  Airway Mallampati: II       Dental  (+) Teeth Intact   Pulmonary shortness of breath,    breath sounds clear to auscultation       Cardiovascular Exercise Tolerance: Good hypertension, Pt. on medications + CAD and + DOE   Rhythm:Regular     Neuro/Psych    GI/Hepatic negative GI ROS, Neg liver ROS,   Endo/Other  diabetes, Well Controlled, Type 1, Insulin Dependent  Renal/GU      Musculoskeletal   Abdominal (+) + obese,   Peds  Hematology  (+) anemia ,   Anesthesia Other Findings   Reproductive/Obstetrics                             Anesthesia Physical Anesthesia Plan  ASA: II  Anesthesia Plan: MAC   Post-op Pain Management:    Induction: Intravenous  Airway Management Planned: Natural Airway and Nasal Cannula  Additional Equipment:   Intra-op Plan:   Post-operative Plan:   Informed Consent: I have reviewed the patients History and Physical, chart, labs and discussed the procedure including the risks, benefits and alternatives for the proposed anesthesia with the patient or authorized representative who has indicated his/her understanding and acceptance.     Plan Discussed with: CRNA  Anesthesia Plan Comments:         Anesthesia Quick Evaluation

## 2015-10-20 DIAGNOSIS — Z23 Encounter for immunization: Secondary | ICD-10-CM | POA: Diagnosis not present

## 2015-10-29 DIAGNOSIS — Z961 Presence of intraocular lens: Secondary | ICD-10-CM | POA: Diagnosis not present

## 2015-11-10 DIAGNOSIS — E1142 Type 2 diabetes mellitus with diabetic polyneuropathy: Secondary | ICD-10-CM | POA: Diagnosis not present

## 2015-11-10 DIAGNOSIS — E1165 Type 2 diabetes mellitus with hyperglycemia: Secondary | ICD-10-CM | POA: Diagnosis not present

## 2015-11-10 DIAGNOSIS — E7800 Pure hypercholesterolemia, unspecified: Secondary | ICD-10-CM | POA: Diagnosis not present

## 2015-11-10 DIAGNOSIS — Z794 Long term (current) use of insulin: Secondary | ICD-10-CM | POA: Diagnosis not present

## 2015-11-10 DIAGNOSIS — D509 Iron deficiency anemia, unspecified: Secondary | ICD-10-CM | POA: Diagnosis not present

## 2015-11-17 DIAGNOSIS — Z794 Long term (current) use of insulin: Secondary | ICD-10-CM | POA: Diagnosis not present

## 2015-11-17 DIAGNOSIS — E1142 Type 2 diabetes mellitus with diabetic polyneuropathy: Secondary | ICD-10-CM | POA: Diagnosis not present

## 2015-11-17 DIAGNOSIS — E1165 Type 2 diabetes mellitus with hyperglycemia: Secondary | ICD-10-CM | POA: Diagnosis not present

## 2015-11-17 DIAGNOSIS — Z79899 Other long term (current) drug therapy: Secondary | ICD-10-CM | POA: Diagnosis not present

## 2015-11-18 DIAGNOSIS — I10 Essential (primary) hypertension: Secondary | ICD-10-CM | POA: Diagnosis not present

## 2015-11-18 DIAGNOSIS — Z794 Long term (current) use of insulin: Secondary | ICD-10-CM | POA: Diagnosis not present

## 2015-11-18 DIAGNOSIS — D638 Anemia in other chronic diseases classified elsewhere: Secondary | ICD-10-CM | POA: Diagnosis not present

## 2015-11-18 DIAGNOSIS — E7800 Pure hypercholesterolemia, unspecified: Secondary | ICD-10-CM | POA: Diagnosis not present

## 2015-11-18 DIAGNOSIS — E875 Hyperkalemia: Secondary | ICD-10-CM | POA: Diagnosis not present

## 2015-11-18 DIAGNOSIS — N183 Chronic kidney disease, stage 3 (moderate): Secondary | ICD-10-CM | POA: Diagnosis not present

## 2015-11-18 DIAGNOSIS — E119 Type 2 diabetes mellitus without complications: Secondary | ICD-10-CM | POA: Diagnosis not present

## 2015-11-18 DIAGNOSIS — Z0000 Encounter for general adult medical examination without abnormal findings: Secondary | ICD-10-CM | POA: Diagnosis not present

## 2015-12-20 DIAGNOSIS — N183 Chronic kidney disease, stage 3 (moderate): Secondary | ICD-10-CM | POA: Diagnosis not present

## 2015-12-20 DIAGNOSIS — I10 Essential (primary) hypertension: Secondary | ICD-10-CM | POA: Diagnosis not present

## 2015-12-20 DIAGNOSIS — R600 Localized edema: Secondary | ICD-10-CM | POA: Diagnosis not present

## 2016-01-31 DIAGNOSIS — E1165 Type 2 diabetes mellitus with hyperglycemia: Secondary | ICD-10-CM | POA: Diagnosis not present

## 2016-01-31 DIAGNOSIS — Z79899 Other long term (current) drug therapy: Secondary | ICD-10-CM | POA: Diagnosis not present

## 2016-01-31 DIAGNOSIS — E1142 Type 2 diabetes mellitus with diabetic polyneuropathy: Secondary | ICD-10-CM | POA: Diagnosis not present

## 2016-01-31 DIAGNOSIS — Z794 Long term (current) use of insulin: Secondary | ICD-10-CM | POA: Diagnosis not present

## 2016-02-14 DIAGNOSIS — Z794 Long term (current) use of insulin: Secondary | ICD-10-CM | POA: Diagnosis not present

## 2016-02-14 DIAGNOSIS — E1165 Type 2 diabetes mellitus with hyperglycemia: Secondary | ICD-10-CM | POA: Diagnosis not present

## 2016-02-14 DIAGNOSIS — E1142 Type 2 diabetes mellitus with diabetic polyneuropathy: Secondary | ICD-10-CM | POA: Diagnosis not present

## 2016-02-21 DIAGNOSIS — I10 Essential (primary) hypertension: Secondary | ICD-10-CM | POA: Diagnosis not present

## 2016-02-21 DIAGNOSIS — F419 Anxiety disorder, unspecified: Secondary | ICD-10-CM | POA: Diagnosis not present

## 2016-02-21 DIAGNOSIS — Z794 Long term (current) use of insulin: Secondary | ICD-10-CM | POA: Diagnosis not present

## 2016-02-21 DIAGNOSIS — Z23 Encounter for immunization: Secondary | ICD-10-CM | POA: Diagnosis not present

## 2016-02-21 DIAGNOSIS — E1142 Type 2 diabetes mellitus with diabetic polyneuropathy: Secondary | ICD-10-CM | POA: Diagnosis not present

## 2016-02-21 DIAGNOSIS — E1165 Type 2 diabetes mellitus with hyperglycemia: Secondary | ICD-10-CM | POA: Diagnosis not present

## 2016-02-21 DIAGNOSIS — E7800 Pure hypercholesterolemia, unspecified: Secondary | ICD-10-CM | POA: Diagnosis not present

## 2016-03-20 ENCOUNTER — Telehealth: Payer: Self-pay | Admitting: Cardiology

## 2016-03-20 NOTE — Telephone Encounter (Signed)
Lmom to call our office for a carotid u/s (1 yr f/u). 

## 2016-03-21 NOTE — Telephone Encounter (Signed)
Pt wife calling, states pt will wait until after appointment on 3/13 before he schedules 1 yr f/u carotid

## 2016-03-26 NOTE — Progress Notes (Signed)
Cardiology Office Note  Date:  03/27/2016   ID:  JODY SILAS, DOB [birth year], MRN [id number]  PCP:  [name], MD   Chief Complaint  Patient presents with  . other    6 month f/u c/o sob . Meds reviewed verbally with pt.    HPI:  76 y/o Male with a prior h/o syncope, HTN, HL, and DM, remote smoking seen by his PCP for increasing dyspnea and underwent stress testing @ [facility], which was abnl,  underwent cath in late June 2017 revealing moderate LAD and LCX/OM dzs with a total occlusion of the prox RCA,  L R collaterals. LV fxn was nl, who presents for routine follow-up of his coronary artery disease, History of chronic shortness of breath  In follow-up today he reports having worsening shortness of breath on exertion Wife reports he does not exercise, try to do some exercise recently, was very short of breath Weight is up 7 pounds from his prior clinic visit Previously reported having shortness of breath February 2016, Echocardiogram at that time, results reviewed with him showing ejection fraction 50-55% On last clinic visit September 2017 fell shortness of breath has improved, now symptoms are back again Less active over the winter   He's been taking isosorbide one half pill per day, 15 mg.  Stable angina symptoms  Details of his catheterization discussed with him  from June 2017  Lab work reviewed with him Total chol 130 LDL 66   EKG personally reviewed by myself showing sinus bradycardia rate 53 bpm no significant ST or T-wave changes  Other past medical history 02/2014 Carotid Dopplers: 1-39% bilateral ICA with normal Subclavian & vertebral arteries; b. 02/2014 2 D Echo: EF 50-55%, Gr 1 DD, Mild MR & LA dilation -- was bradycardic in 40s 02/2014 Neg Ex MV, EF 65%; b. 06/2015 Abnl MV ([facility])-->Cath ([facility]): LM nl, LAD 30p/m, LCX 30p, OM2 50p, RCA 100p w/ L-->R collats, nl EF-->Med Rx.   PMH:   has a past medical history of Anemia; CAD  (coronary artery disease); Diabetes mellitus, type II, insulin dependent ([place]); Diverticulitis; Essential hypertension; Hyperlipidemia; Neuropathy ([place]); Shortness of breath dyspnea; and Syncope and collapse.  PSH:    Past Surgical History:  Procedure Laterality Date  . CARDIAC CATHETERIZATION Left 07/07/2015   Procedure: Left Heart Cath and Coronary Angiography;  Surgeon: Isaias Cowman, MD;  Location: [facility];  Service: Cardiovascular;  Laterality: Left;  . CARPAL TUNNEL RELEASE    . CATARACT EXTRACTION Right   . CATARACT EXTRACTION W/PHACO Left 10/06/2015   Procedure: CATARACT EXTRACTION PHACO AND INTRAOCULAR LENS PLACEMENT (IOC);  Surgeon: Estill Cotta, MD;  Location: [facility];  Service: Ophthalmology;  Laterality: Left;  [place] 01:42 AP% 25.1 CDE 45.92 Fluid pack lot # [id number]  . NM MYOVIEW LTD  03/13/2014   LOW RISK - positive EKG but negative imaging: 2 mm ST Depression in V4 & V5 but No scintigraphic evidence of Ischemia or Infarct o  . TRANSTHORACIC ECHOCARDIOGRAM  03/12/2014   EF 50-55%, Gr 1 DD, no RWMA, Mlid MR & LA dil, Normla RV and bradycardic 40s -    Current Outpatient Prescriptions  Medication Sig Dispense Refill  . aspirin 81 MG tablet Take 81 mg by mouth daily.    . calcium carbonate (OS-CAL) 600 MG TABS tablet Take 600 mg by mouth daily with breakfast.    . cholecalciferol (VITAMIN D) 1000 UNITS tablet Take 1,000 Units by mouth daily.    . Ferrous Sulfate (  IRON) 325 (65 FE) MG TABS Take 1 tablet by mouth daily.     Marland Kitchen gabapentin (NEURONTIN) 300 MG capsule Take 600 mg by mouth at bedtime.    . hydrochlorothiazide (MICROZIDE) 12.5 MG capsule Take 12.5 mg by mouth daily.     . isosorbide mononitrate (IMDUR) 30 MG 24 hr tablet Take 1 tablet (30 mg total) by mouth daily. (Patient taking differently: Take 30 mg by mouth daily. One half tablet daily in the morning.) 90 tablet 3  . LANTUS 100 UNIT/ML injection Inject 42 Units into the skin daily. Take at  bedtime    . lisinopril (PRINIVIL,ZESTRIL) 20 MG tablet Take 20 mg by mouth daily.    . metFORMIN (GLUCOPHAGE) 500 MG tablet Take 250 mg by mouth 2 (two) times daily with a meal.    . NOVOLOG 100 UNIT/ML injection Inject into the skin 3 (three) times daily with meals. 12 units am, 12 units lunch, 14 units supper    . simvastatin (ZOCOR) 20 MG tablet Take 20 mg by mouth daily.    . tamsulosin (FLOMAX) 0.4 MG CAPS capsule Take 0.4 mg by mouth daily.     No current facility-administered medications for this visit.      Allergies:   Amlodipine   Social History:  The patient  reports that he has never smoked. He has never used smokeless tobacco. He reports that he does not drink alcohol or use drugs.   Family History:   family history includes Heart attack in his mother.    Review of Systems: Review of Systems  Constitutional: Negative.   Respiratory: Positive for shortness of breath.   Cardiovascular: Positive for chest pain.  Gastrointestinal: Negative.   Musculoskeletal: Negative.   Neurological: Negative.   Psychiatric/Behavioral: Negative.   All other systems reviewed and are negative.    PHYSICAL EXAM: VS:  BP 128/60 (BP Location: Left Arm, Patient Position: Sitting, Cuff Size: Normal)   Pulse (!) 53   Ht 5\' 8"  (1.727 m)   Wt 195 lb 8 oz (88.7 kg)   BMI 29.73 kg/m  , BMI Body mass index is 29.73 kg/m. GEN: Well nourished, well developed, in no acute distress , Obese HEENT: normal  Neck: no JVD, carotid bruits, or masses Cardiac: RRR; 2/6 SEM LSB,  no rubs, or gallops,no edema  Respiratory:  clear to auscultation bilaterally, normal work of breathing GI: soft, nontender, nondistended, + BS MS: no deformity or atrophy  Skin: warm and dry, no rash Neuro:  Strength and sensation are intact Psych: euthymic mood, full affect    Recent Labs: No results found for requested labs within last 8760 hours.    Lipid Panel No results found for: CHOL, HDL, LDLCALC, TRIG     Wt Readings from Last 3 Encounters:  03/27/16 195 lb 8 oz (88.7 kg)  10/06/15 188 lb (85.3 kg)  09/27/15 196 lb (88.9 kg)       ASSESSMENT AND PLAN:  Essential hypertension - Plan: EKG 12-Lead Blood pressure is well controlled on today's visit. No changes made to the medications.  Mixed hyperlipidemia - Plan: EKG 12-Lead Cholesterol is at goal on the current lipid regimen. No changes to the medications were made.  Coronary artery disease of native artery of native heart with stable angina pectoris ([place]) - Plan: EKG 12-Lead Occluded RCA with collaterals from left to right by catheterization June 2017 Persistent shortness of breath, stable anginal symptoms, Long discussion with him about various treatment options No further ischemia workup  needed at this time given symptoms have been relatively constant Recommended cardiac rehabilitation  Diabetes mellitus, type II, insulin dependent ([place]) - Plan: EKG 12-Lead We have encouraged continued exercise, careful diet management in an effort to lose weight.  DOE (dyspnea on exertion) - Plan: EKG 12-Lead Stable anginal symptoms, occluded RCA, borderline low normal ejection fraction by echocardiogram February 2016. Suspect very deconditioned,  Recommended he participate in cardiac rehabilitation given his stable anginal symptoms, shortness of breath We have recommended a short trial of Lasix 20 mg daily for 3-4 days.  Unable to exclude mild diastolic CHF Recommended he call city end of the week to let us know if diuresis helped his symptoms  Chronic renal impairment, stage 3 (moderate) - Plan: EKG 12-Lead Most recent creatinine 1.3, BUN 20 Lasix as above for shortness of breath, would take sparingly   Total encounter time more than 25 minutes  Greater than 50% was spent in counseling and coordination of care with the patient   Disposition:   F/U  6 months   Orders Placed This Encounter  Procedures  . EKG 12-Lead      Signed, Esmond Plants, M.D., Ph.D. 03/27/2016  [place], [place]

## 2016-03-27 ENCOUNTER — Encounter: Payer: Self-pay | Admitting: Cardiovascular Disease

## 2016-03-27 ENCOUNTER — Ambulatory Visit (INDEPENDENT_AMBULATORY_CARE_PROVIDER_SITE_OTHER): Payer: PPO | Admitting: Cardiovascular Disease

## 2016-03-27 VITALS — BP 128/60 | HR 53 | Ht 68.0 in | Wt 195.5 lb

## 2016-03-27 DIAGNOSIS — I25118 Atherosclerotic heart disease of native coronary artery with other forms of angina pectoris: Secondary | ICD-10-CM | POA: Diagnosis not present

## 2016-03-27 DIAGNOSIS — E782 Mixed hyperlipidemia: Secondary | ICD-10-CM | POA: Diagnosis not present

## 2016-03-27 DIAGNOSIS — N183 Chronic kidney disease, stage 3 unspecified: Secondary | ICD-10-CM

## 2016-03-27 DIAGNOSIS — I10 Essential (primary) hypertension: Secondary | ICD-10-CM | POA: Diagnosis not present

## 2016-03-27 DIAGNOSIS — Z794 Long term (current) use of insulin: Secondary | ICD-10-CM

## 2016-03-27 DIAGNOSIS — R0600 Dyspnea, unspecified: Secondary | ICD-10-CM

## 2016-03-27 DIAGNOSIS — R0609 Other forms of dyspnea: Secondary | ICD-10-CM | POA: Diagnosis not present

## 2016-03-27 DIAGNOSIS — E119 Type 2 diabetes mellitus without complications: Secondary | ICD-10-CM | POA: Diagnosis not present

## 2016-03-27 MED ORDER — FUROSEMIDE 20 MG PO TABS: 20.0000 mg | ORAL_TABLET | ORAL | 6 refills | Status: DC | PRN

## 2016-03-27 NOTE — Patient Instructions (Addendum)
We will place an order for cardiac rehab given previous MI, coronary disease, shortness of breath They will review your chart and contact you with an appointment  Medication Instructions:   Please take lasix as needed 3 to 4 days in a row for shortness of breath Take as needed Take with banana  Labwork:  No new labs needed  Testing/Procedures:  No further testing at this time   I recommend watching educational videos on topics of interest to you at:       [email or web address]  Enter code: [email or web address]    Follow-Up: It was a pleasure seeing you in the office today. Please call Ronald Wallace if you have new issues that need to be addressed before your next appt.  [phone]  Your physician wants you to follow-up in: 6 months.  You will receive a reminder letter in the mail two months in advance. If you don't receive a letter, please call our office to schedule the follow-up appointment.  If you need a refill on your cardiac medications before your next appointment, please call your pharmacy.     Cardiac Rehabilitation What is cardiac rehabilitation? Cardiac rehabilitation is a treatment program that helps improve the health and well-being of people who have heart problems. Cardiac rehabilitation includes exercise training, education, and counseling to help you get stronger and return to an active lifestyle. This program can help you get better faster and reduce any future hospital stays. Why might I need cardiac rehabilitation?   Cardiac rehabilitation programs can help when you have or have had:  A heart attack.  Heart failure.  Peripheral artery disease.  Coronary artery disease.  Angina.  Lung or breathing problems. Cardiac rehabilitation programs are also used when you have had:  Coronary artery bypass graft surgery.  Heart valve replacement.  Heart stent placement.  Heart transplant.  Aneurysm repair. What are the benefits of cardiac rehabilitation? Cardiac  rehabilitation can help:  Reduce problems like chest pain and trouble breathing.  Change risk factors that contribute to heart disease, such as:  Smoking.  High blood pressure.  High cholesterol.  Diabetes.  Being out of shape or not active.  Weighing more than 30% higher than your ideal weight.  Diet.  Improve your mental outlook so you feel:  More hopeful.  Better about yourself.  More confident about taking care of yourself.  Get support from health experts as well as other people with similar problems.  Learn how to manage and understand your medicines.  Teach your family about your condition and how to participate in your recovery. What happens in cardiac rehabilitation? You will be assessed by a cardiac rehabilitation team. They will check your health history and do a physical exam. You may need blood tests, stress tests, and other evaluations to make sure that you are ready to start cardiac rehabilitation. The cardiac rehabilitation team works with you to make a plan based on your health and goals. Your program will be tailored to fit you and your needs and may change as you progress. You may work with a health care team that includes:  Doctors.  Nurses.  Dietitians.  Psychologists.  Exercise specialists.  Physical and occupational therapists. What are the phases of cardiac rehabilitation? A cardiac rehabilitation program is often divided into phases. You advance from one phase to the next. Phase One  This phase starts while you are still in the hospital. You may start by walking in your room and then in the hall. You may start  some simple exercises with a therapist. Phase Two  This phase begins when you go home or to another facility. This phase may last 8-12 weeks. You will travel to a cardiac rehabilitation center or another place where rehabilitation is offered. You will slowly increase your activity level while being closely watched by a nurse or  therapist. Exercises may include a combination of strength or resistance training and "cardio" or aerobic movement on a treadmill or other machines. Your condition will determine how often and how long these sessions last. In phase two, you may learn how to cook healthy meals, control your blood sugar, and manage your medicines. You may need help with scheduling or planning how and when to take your medicines. If you have questions about your medicines, it is very important that you talk to your health care provider. Phase Three  This phase continues for the rest of your life. There will be less supervision. You may still participate in cardiac rehabilitation activities or become part of a group in your community. You may benefit from talking about your experience with other people who are facing similar challenges. Get help right away if:  You have severe chest discomfort, especially if the pain is crushing or pressure-like and spreads to your arms, back, neck, or jaw. Do not wait to see if the pain will go away.  You have weakness or numbness in your face, arms, or legs, especially on one side of the body.  Your speech is slurred.  You are confused.  You have a sudden severe headache or loss of vision.  You have shortness of breath.  You are sweating and have nausea.  You feel dizzy or faint.  You are fatigued. These symptoms may represent a serious problem that is an emergency. Do not wait to see if the symptoms will go away. Get medical help right away. Call your local emergency services (911 in the [place] Do not drive yourself to the hospital. This information is not intended to replace advice given to you by your health care provider. Make sure you discuss any questions you have with your health care provider. Document Released: 10/12/2007 Document Revised: 12/20/2015 Document Reviewed: 11/16/2014 Elsevier Interactive Patient Education  2017 [employer].

## 2016-03-31 ENCOUNTER — Telehealth: Payer: Self-pay | Admitting: Cardiovascular Disease

## 2016-03-31 NOTE — Telephone Encounter (Signed)
Spoke w/ pt.  Advised him that he was given instructions at his last ov on 3/12; reviewed lasix instructions w/ him.  Advised him that I have sent referral to cardiac rehab and they will not sched him until they know ins will cover. Asked him to call back w/ any further questions or concerns. He is appreciative of the call.

## 2016-03-31 NOTE — Telephone Encounter (Signed)
Pt wife has some questions regarding [facility]. She asks how long will he go, will ins cover.  She also asks if he should still be taking his fluid pill. Please call.

## 2016-04-03 ENCOUNTER — Other Ambulatory Visit: Payer: Self-pay | Admitting: *Deleted

## 2016-04-03 DIAGNOSIS — E1142 Type 2 diabetes mellitus with diabetic polyneuropathy: Secondary | ICD-10-CM | POA: Diagnosis not present

## 2016-04-03 DIAGNOSIS — E1165 Type 2 diabetes mellitus with hyperglycemia: Secondary | ICD-10-CM | POA: Diagnosis not present

## 2016-04-03 DIAGNOSIS — Z794 Long term (current) use of insulin: Secondary | ICD-10-CM | POA: Diagnosis not present

## 2016-04-06 NOTE — Telephone Encounter (Signed)
Per carotid u/s 03/07/14:  "f/u 2 years."   He is due for this now.  He certainly has the right to refuse testing or delay, but it's recommended that he have testing as prescribed.

## 2016-04-06 NOTE — Telephone Encounter (Signed)
Patient is unsure if this is needed. [name] did not mention this in the last ov .  Please confirm if this is actually needed. Lease call .

## 2016-04-10 ENCOUNTER — Telehealth: Payer: Self-pay | Admitting: Cardiovascular Disease

## 2016-04-10 NOTE — Telephone Encounter (Signed)
Per last carotid in 2016:   Examination Data cm/s cm/s Heterogeneous plaque, bilaterally. 1-39% bilateral ICA stenosis. Normal subclavian arteries, bilaterally. Patent vertebral arteries with antegrade flow. f/u 2 years.  Pt is due for repeat testing, but he certainly has the right to decline, but our recommendation is to have u/s done.

## 2016-04-10 NOTE — Telephone Encounter (Signed)
Called patient  To relay msg below.  Patient does not want to schedule at this time .  Will call us when ready.

## 2016-04-10 NOTE — Telephone Encounter (Signed)
Pt wife calling back, we had called to schedule patient to have a 2 year carotid check up But she wants to know what Dr Rockey Situ thought about this If patient really needs this done or not  Please advise

## 2016-04-10 NOTE — Telephone Encounter (Signed)
Lmov for patient to call back letting them know Dr Donivan Scull suggestion on the test

## 2016-04-11 NOTE — Telephone Encounter (Signed)
Pt spouse called back and scheduled test  Nothing further needed

## 2016-04-12 ENCOUNTER — Ambulatory Visit: Payer: PPO

## 2016-04-12 ENCOUNTER — Telehealth: Payer: Self-pay | Admitting: Cardiovascular Disease

## 2016-04-12 ENCOUNTER — Other Ambulatory Visit: Payer: Self-pay | Admitting: Family Medicine

## 2016-04-12 DIAGNOSIS — I6523 Occlusion and stenosis of bilateral carotid arteries: Secondary | ICD-10-CM

## 2016-04-12 NOTE — Telephone Encounter (Signed)
Pt wife was in the office with pt and asks if pt still has to take his fluid pill, and if so, instead of having to eat a banana each time, can he have a potassium pill to take instead. Please advise.

## 2016-04-12 NOTE — Telephone Encounter (Signed)
Returned call patient's wife. She wants to know if the patient can take the furosemide again when SOB. Patient took for 4 days from 03/27/16 after office visit with Dr Rockey Situ. He has not had in about a week and a half now. He is having some SOB with exertion the last day or so. Denies swelling, CP or palpitations.  Advised it was ok to take the furosemide 20 mg once a day for 3-4 days as needed as Dr Rockey Situ ordered at last office visit.  We discussed other potassium rich foods and she said she would stick with the banana when he takes the potassium for now. Advised to call us if he finds he is taking the furosemide more frequently then he needs to call us and let us know. Patient and wife verbalized understanding.

## 2016-06-23 DIAGNOSIS — E1165 Type 2 diabetes mellitus with hyperglycemia: Secondary | ICD-10-CM | POA: Diagnosis not present

## 2016-06-23 DIAGNOSIS — I10 Essential (primary) hypertension: Secondary | ICD-10-CM | POA: Diagnosis not present

## 2016-06-23 DIAGNOSIS — E7800 Pure hypercholesterolemia, unspecified: Secondary | ICD-10-CM | POA: Diagnosis not present

## 2016-06-23 DIAGNOSIS — E1142 Type 2 diabetes mellitus with diabetic polyneuropathy: Secondary | ICD-10-CM | POA: Diagnosis not present

## 2016-06-23 DIAGNOSIS — Z794 Long term (current) use of insulin: Secondary | ICD-10-CM | POA: Diagnosis not present

## 2016-06-30 DIAGNOSIS — I10 Essential (primary) hypertension: Secondary | ICD-10-CM | POA: Diagnosis not present

## 2016-06-30 DIAGNOSIS — N289 Disorder of kidney and ureter, unspecified: Secondary | ICD-10-CM | POA: Diagnosis not present

## 2016-06-30 DIAGNOSIS — E7800 Pure hypercholesterolemia, unspecified: Secondary | ICD-10-CM | POA: Diagnosis not present

## 2016-07-04 DIAGNOSIS — E11319 Type 2 diabetes mellitus with unspecified diabetic retinopathy without macular edema: Secondary | ICD-10-CM | POA: Diagnosis not present

## 2016-07-11 DIAGNOSIS — Z794 Long term (current) use of insulin: Secondary | ICD-10-CM | POA: Diagnosis not present

## 2016-07-11 DIAGNOSIS — E1142 Type 2 diabetes mellitus with diabetic polyneuropathy: Secondary | ICD-10-CM | POA: Diagnosis not present

## 2016-07-11 DIAGNOSIS — E1165 Type 2 diabetes mellitus with hyperglycemia: Secondary | ICD-10-CM | POA: Diagnosis not present

## 2016-09-28 ENCOUNTER — Other Ambulatory Visit: Payer: Self-pay | Admitting: Cardiovascular Disease

## 2016-10-02 DIAGNOSIS — E1142 Type 2 diabetes mellitus with diabetic polyneuropathy: Secondary | ICD-10-CM | POA: Diagnosis not present

## 2016-10-02 DIAGNOSIS — I10 Essential (primary) hypertension: Secondary | ICD-10-CM | POA: Diagnosis not present

## 2016-10-02 DIAGNOSIS — Z794 Long term (current) use of insulin: Secondary | ICD-10-CM | POA: Diagnosis not present

## 2016-10-02 DIAGNOSIS — E785 Hyperlipidemia, unspecified: Secondary | ICD-10-CM | POA: Diagnosis not present

## 2016-10-02 DIAGNOSIS — E1169 Type 2 diabetes mellitus with other specified complication: Secondary | ICD-10-CM | POA: Diagnosis not present

## 2016-10-02 DIAGNOSIS — E1159 Type 2 diabetes mellitus with other circulatory complications: Secondary | ICD-10-CM | POA: Diagnosis not present

## 2016-10-09 ENCOUNTER — Other Ambulatory Visit: Payer: Self-pay | Admitting: Cardiovascular Disease

## 2016-10-10 ENCOUNTER — Other Ambulatory Visit: Payer: Self-pay | Admitting: *Deleted

## 2016-10-10 ENCOUNTER — Telehealth: Payer: Self-pay | Admitting: Cardiovascular Disease

## 2016-10-10 MED ORDER — ISOSORBIDE MONONITRATE ER 30 MG PO TB24: 30.0000 mg | ORAL_TABLET | Freq: Every day | ORAL | 1 refills | Status: DC

## 2016-10-10 NOTE — Telephone Encounter (Signed)
Requested Prescriptions   Signed Prescriptions Disp Refills  . isosorbide mononitrate (IMDUR) 30 MG 24 hr tablet 90 tablet 1    Sig: Take 1 tablet (30 mg total) by mouth daily.    Authorizing Provider: Minna Merritts    Ordering User: Britt Bottom

## 2016-10-10 NOTE — Telephone Encounter (Signed)
°*  STAT* If patient is at the pharmacy, call can be transferred to refill team.   1. Which medications need to be refilled? (please list name of each medication and dose if known)  Isosorbide   2. Which pharmacy/location (including street and city if local pharmacy) is medication to be sent to? [facility]   3. Do they need a 30 day or 90 day supply?  90 day

## 2016-10-23 DIAGNOSIS — E7800 Pure hypercholesterolemia, unspecified: Secondary | ICD-10-CM | POA: Diagnosis not present

## 2016-10-23 DIAGNOSIS — I10 Essential (primary) hypertension: Secondary | ICD-10-CM | POA: Diagnosis not present

## 2016-10-23 DIAGNOSIS — E1142 Type 2 diabetes mellitus with diabetic polyneuropathy: Secondary | ICD-10-CM | POA: Diagnosis not present

## 2016-10-23 DIAGNOSIS — Z794 Long term (current) use of insulin: Secondary | ICD-10-CM | POA: Diagnosis not present

## 2016-10-23 DIAGNOSIS — N289 Disorder of kidney and ureter, unspecified: Secondary | ICD-10-CM | POA: Diagnosis not present

## 2016-10-30 DIAGNOSIS — F419 Anxiety disorder, unspecified: Secondary | ICD-10-CM | POA: Diagnosis not present

## 2016-10-30 DIAGNOSIS — N401 Enlarged prostate with lower urinary tract symptoms: Secondary | ICD-10-CM

## 2016-10-30 DIAGNOSIS — R001 Bradycardia, unspecified: Secondary | ICD-10-CM | POA: Diagnosis not present

## 2016-10-30 DIAGNOSIS — E1159 Type 2 diabetes mellitus with other circulatory complications: Secondary | ICD-10-CM | POA: Diagnosis not present

## 2016-10-30 DIAGNOSIS — C4442 Squamous cell carcinoma of skin of scalp and neck: Secondary | ICD-10-CM | POA: Diagnosis not present

## 2016-10-30 DIAGNOSIS — X32XXXA Exposure to sunlight, initial encounter: Secondary | ICD-10-CM | POA: Diagnosis not present

## 2016-10-30 DIAGNOSIS — E669 Obesity, unspecified: Secondary | ICD-10-CM | POA: Diagnosis not present

## 2016-10-30 DIAGNOSIS — R351 Nocturia: Secondary | ICD-10-CM | POA: Insufficient documentation

## 2016-10-30 DIAGNOSIS — N400 Enlarged prostate without lower urinary tract symptoms: Secondary | ICD-10-CM | POA: Diagnosis not present

## 2016-10-30 DIAGNOSIS — D044 Carcinoma in situ of skin of scalp and neck: Secondary | ICD-10-CM | POA: Diagnosis not present

## 2016-10-30 DIAGNOSIS — L905 Scar conditions and fibrosis of skin: Secondary | ICD-10-CM | POA: Diagnosis not present

## 2016-10-30 DIAGNOSIS — D485 Neoplasm of uncertain behavior of skin: Secondary | ICD-10-CM | POA: Diagnosis not present

## 2016-10-30 DIAGNOSIS — L570 Actinic keratosis: Secondary | ICD-10-CM | POA: Diagnosis not present

## 2016-10-30 DIAGNOSIS — I10 Essential (primary) hypertension: Secondary | ICD-10-CM | POA: Diagnosis not present

## 2016-10-30 DIAGNOSIS — C44519 Basal cell carcinoma of skin of other part of trunk: Secondary | ICD-10-CM | POA: Diagnosis not present

## 2016-10-30 DIAGNOSIS — L821 Other seborrheic keratosis: Secondary | ICD-10-CM | POA: Diagnosis not present

## 2016-10-30 HISTORY — DX: Benign prostatic hyperplasia with lower urinary tract symptoms: N40.1

## 2016-11-15 ENCOUNTER — Telehealth: Payer: Self-pay | Admitting: Cardiovascular Disease

## 2016-11-15 ENCOUNTER — Other Ambulatory Visit: Payer: Self-pay | Admitting: Family Medicine

## 2016-11-15 DIAGNOSIS — G459 Transient cerebral ischemic attack, unspecified: Secondary | ICD-10-CM

## 2016-11-15 DIAGNOSIS — R55 Syncope and collapse: Secondary | ICD-10-CM

## 2016-11-15 NOTE — Telephone Encounter (Signed)
Spoke with patients wife per release form and reviewed with her that we do not have earlier appointment at this time but that we would add to the wait list if we should have cancellation. She states that they ordered a MRI for him to have as well. Reviewed with her that if he should have these same symptoms again they should go to the nearest ED for evaluation. She verbalized understanding and was appreciative for the call back. Will route to Dr. Rockey Situ and also support pool to add to waiting list.
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
## 2016-11-17 ENCOUNTER — Ambulatory Visit
Admission: RE | Admit: 2016-11-17 | Discharge: 2016-11-17 | Disposition: A | Discharge status: Home / Self Care | Payer: PPO | Source: Ambulatory Visit | Attending: Family Medicine | Admitting: Family Medicine

## 2016-11-17 ENCOUNTER — Ambulatory Visit: Payer: PPO

## 2016-11-17 DIAGNOSIS — M47892 Other spondylosis, cervical region: Secondary | ICD-10-CM | POA: Diagnosis not present

## 2016-11-17 DIAGNOSIS — R55 Syncope and collapse: Secondary | ICD-10-CM

## 2016-11-17 DIAGNOSIS — I6523 Occlusion and stenosis of bilateral carotid arteries: Secondary | ICD-10-CM | POA: Insufficient documentation

## 2016-11-17 DIAGNOSIS — E1142 Type 2 diabetes mellitus with diabetic polyneuropathy: Secondary | ICD-10-CM | POA: Diagnosis not present

## 2016-11-17 DIAGNOSIS — G459 Transient cerebral ischemic attack, unspecified: Secondary | ICD-10-CM

## 2016-11-17 DIAGNOSIS — Z794 Long term (current) use of insulin: Secondary | ICD-10-CM | POA: Diagnosis not present

## 2016-11-17 DIAGNOSIS — R51 Headache: Secondary | ICD-10-CM | POA: Diagnosis not present

## 2016-11-17 MED ORDER — GADOBENATE DIMEGLUMINE 529 MG/ML IV SOLN
15.0000 mL | Freq: Once | INTRAVENOUS | Status: AC | PRN
  Administered 2016-11-17: 15 mL via INTRAVENOUS

## 2016-11-22 ENCOUNTER — Telehealth: Payer: Self-pay | Admitting: Cardiovascular Disease

## 2016-11-22 NOTE — Telephone Encounter (Signed)
Pt wife states pt has been having pain in his left shoulder under his armpit into his breast. States this has been going on for 3-4 weeks. Also has had SOB, but not every day.
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
## 2016-11-22 NOTE — Telephone Encounter (Signed)
Left flank pain sounds somewhat atypical Last stress test was over 2 years ago Will defer to him whether he would like repeat stress testing He has occlusion of right coronary artery and moderate blockage of LAD  Other symptoms concerning for TIA or stroke Would agree with MRI Does not seem to have much carotid buildup

## 2016-11-23 NOTE — Telephone Encounter (Signed)
Spoke with patients wife per release form and reviewed Dr. Donivan Scull recommendations. Patient had stress test 07/02/15 and cath 07/07/15 as well. They have appointment scheduled on Monday 11/27/16 with Dr. Rockey Situ and confirmed appointment.She was appreciative for the call back and had no further questions at this time.

## 2016-11-26 NOTE — Progress Notes (Signed)
Cardiology Office Note  Date:  11/27/2016   ID:  Ronald Wallace, DOB Mar 08, 1940, MRN [id number]  PCP:  Ronald Body, MD   Chief Complaint  Patient presents with  . other    6 month follow up. Patient c/o chest pain, SOB, left leg pain and hand numbness. Meds reviewed verbally with patient.     HPI:  76 y/o Male with a prior h/o  syncope,  HTN,  HL,   DM, remote smoking seen by his PCP for increasing dyspnea and underwent stress testing @ [facility], which was abnl,   cath in late June 2017 revealing moderate LAD and LCX/OM dzs with a total occlusion of the prox RCA,  L R collaterals.  LV fxn was nl,  who presents for routine follow-up of his coronary artery disease, History of chronic shortness of breath  In follow-up today he reports that he had an episode of confusion 3 weeks ago when driving Seem to have vision issues, post on the pedal Before he knew what he was going 32 and a 35 and had difficulty making a turn  Reports having problems with his vision in general, seems to go in and out Etiology unclear, possibly attributes this to low sugar  Lab work reviewed hemoglobin A1c of 8 Glucose level sometimes low in the morning Total chol 109 tolerating simvastatin  No regular exercise program, family worried he is short of breath with any exertion Sits around all day Previously did not do cardiac rehab Takes Lasix for leg swelling and abdominal bloating, shortness of breath  Weight up 10 pounds in the past year Stable angina symptoms   catheterization  June 2017 discussed with him, 30% LAD disease, occluded RCA with collaterals  EKG personally reviewed by myself showing sinus bradycardia rate 52 bpm no significant ST or T-wave changes  Other past medical history 02/2014 Carotid Dopplers: 1-39% bilateral ICA with normal Subclavian & vertebral arteries; b. 02/2014 2 D Echo: EF 50-55%, Gr 1 DD, Mild MR & LA dilation -- was bradycardic in 40s 02/2014 Neg Ex  MV, EF 65%; b. 06/2015 Abnl MV ([facility])-->Cath ([facility]): LM nl, LAD 30p/m, LCX 30p, OM2 50p, RCA 100p w/ L-->R collats, nl EF-->Med Rx.   PMH:   has a past medical history of Anemia, CAD (coronary artery disease), Diabetes mellitus, type II, insulin dependent ([place]), Diverticulitis, Essential hypertension, Hyperlipidemia, Neuropathy, Shortness of breath dyspnea, and Syncope and collapse.  PSH:    Past Surgical History:  Procedure Laterality Date  . CARPAL TUNNEL RELEASE    . CATARACT EXTRACTION Right   . NM MYOVIEW LTD  03/13/2014   LOW RISK - positive EKG but negative imaging: 2 mm ST Depression in V4 & V5 but No scintigraphic evidence of Ischemia or Infarct o  . TRANSTHORACIC ECHOCARDIOGRAM  03/12/2014   EF 50-55%, Gr 1 DD, no RWMA, Mlid MR & LA dil, Normla RV and bradycardic 40s -    Current Outpatient Medications  Medication Sig Dispense Refill  . aspirin 81 MG tablet Take 81 mg by mouth daily.    . calcium carbonate (OS-CAL) 600 MG TABS tablet Take 600 mg by mouth daily with breakfast.    . cholecalciferol (VITAMIN D) 1000 UNITS tablet Take 1,000 Units by mouth daily.    . Ferrous Sulfate (IRON) 325 (65 FE) MG TABS Take 1 tablet by mouth daily.     . furosemide (LASIX) 20 MG tablet Take 1 tablet (20 mg total) by mouth as needed for fluid (  for shortness of breath). 30 tablet 6  . gabapentin (NEURONTIN) 300 MG capsule Take 600 mg by mouth at bedtime.    . hydrochlorothiazide (MICROZIDE) 12.5 MG capsule Take 12.5 mg by mouth daily.     . isosorbide mononitrate (IMDUR) 30 MG 24 hr tablet Take 1 tablet (30 mg total) by mouth daily. 90 tablet 1  . LANTUS 100 UNIT/ML injection Inject 42 Units into the skin daily. Take at bedtime    . lisinopril (PRINIVIL,ZESTRIL) 20 MG tablet Take 20 mg by mouth daily.    . metFORMIN (GLUCOPHAGE) 500 MG tablet Take 250 mg by mouth 2 (two) times daily with a meal.    . NOVOLOG 100 UNIT/ML injection Inject into the skin 3 (three) times daily with meals. 12  units am, 12 units lunch, 14 units supper    . simvastatin (ZOCOR) 20 MG tablet Take 20 mg by mouth daily.    . tamsulosin (FLOMAX) 0.4 MG CAPS capsule Take 0.4 mg by mouth daily.     No current facility-administered medications for this visit.      Allergies:   Amlodipine   Social History:  The patient  reports that  has never smoked. he has never used smokeless tobacco. He reports that he does not drink alcohol or use drugs.   Family History:   family history includes Heart attack in his mother.    Review of Systems: Review of Systems  Constitutional: Negative.   Respiratory: Positive for shortness of breath.   Cardiovascular: Positive for chest pain.  Gastrointestinal: Negative.   Musculoskeletal: Negative.   Neurological: Negative.   Psychiatric/Behavioral: Negative.   All other systems reviewed and are negative.    PHYSICAL EXAM: VS:  BP 130/60 (BP Location: Left Arm, Patient Position: Sitting, Cuff Size: Normal)   Pulse (!) 52   Ht 5\' 3"  (1.6 m)   Wt 196 lb (88.9 kg)   BMI 34.72 kg/m  , BMI Wallace mass index is 34.72 kg/m.  GEN: Well nourished, well developed, in no acute distress , Obese HEENT: normal  Neck: no JVD, carotid bruits, or masses Cardiac: RRR; 2/6 SEM LSB,  no rubs, or gallops,no edema  Respiratory:  clear to auscultation bilaterally, normal work of breathing GI: soft, nontender, nondistended, + BS MS: no deformity or atrophy  Skin: warm and dry, no rash Neuro:  Strength and sensation are intact Psych: euthymic mood, full affect    Recent Labs: No results found for requested labs within last 8760 hours.    Lipid Panel No results found for: CHOL, HDL, LDLCALC, TRIG    Wt Readings from Last 3 Encounters:  11/27/16 196 lb (88.9 kg)  03/27/16 195 lb 8 oz (88.7 kg)  10/06/15 188 lb (85.3 kg)       ASSESSMENT AND PLAN:  Essential hypertension - Plan: EKG 12-Lead Blood pressure is well controlled on today's visit. No changes made to the  medications. Stable  Mixed hyperlipidemia - Plan: EKG 12-Lead Cholesterol is at goal on the current lipid regimen. No changes to the medications were made. Discussed new guidelines pushing for more aggressive LDL.  Will continue simvastatin at current dose for now   Coronary artery disease of native artery of native heart with stable angina pectoris ([place]) - Plan: EKG 12-Lead Occluded RCA with collaterals from left to right by catheterization June 2017 Persistent shortness of breath, stable anginal symptoms, Chronic diastolic CHF symptoms Recommended cardiac rehab  Diabetes mellitus, type II, insulin dependent ([place]) - Plan: EKG  12-Lead We have encouraged continued exercise, careful diet management in an effort to lose weight. Very sedentary at baseline  DOE (dyspnea on exertion) - Plan: EKG 12-Lead Stable angina, chronic diastolic CHF, obesity, sedentary lifestyle Previous MI, occlusion of RCA with collaterals left to right We previously recommended cardiac rehab, he did not follow through Did not go to the [facility] as promised New order placed for cardiac rehab  Chronic renal impairment, stage 3 (moderate) - Plan: EKG 12-Lead Lasix as above for shortness of breath, currently taking as needed given borderline renal function   Total encounter time more than 45 minutes  Greater than 50% was spent in counseling and coordination of care with the patient   Disposition:   F/U  6 months   No orders of the defined types were placed in this encounter.    Signed, Esmond Plants, M.D., Ph.D. 11/27/2016  [place]

## 2016-11-27 ENCOUNTER — Encounter: Payer: Self-pay | Admitting: Cardiovascular Disease

## 2016-11-27 ENCOUNTER — Telehealth: Payer: Self-pay | Admitting: Cardiovascular Disease

## 2016-11-27 ENCOUNTER — Ambulatory Visit: Payer: PPO | Admitting: Cardiovascular Disease

## 2016-11-27 VITALS — BP 130/60 | HR 52 | Ht 63.0 in | Wt 196.0 lb

## 2016-11-27 DIAGNOSIS — R55 Syncope and collapse: Secondary | ICD-10-CM | POA: Diagnosis not present

## 2016-11-27 DIAGNOSIS — I25118 Atherosclerotic heart disease of native coronary artery with other forms of angina pectoris: Secondary | ICD-10-CM | POA: Diagnosis not present

## 2016-11-27 DIAGNOSIS — R0600 Dyspnea, unspecified: Secondary | ICD-10-CM

## 2016-11-27 DIAGNOSIS — E782 Mixed hyperlipidemia: Secondary | ICD-10-CM

## 2016-11-27 DIAGNOSIS — Z794 Long term (current) use of insulin: Secondary | ICD-10-CM

## 2016-11-27 DIAGNOSIS — R0609 Other forms of dyspnea: Secondary | ICD-10-CM

## 2016-11-27 DIAGNOSIS — I10 Essential (primary) hypertension: Secondary | ICD-10-CM

## 2016-11-27 DIAGNOSIS — E119 Type 2 diabetes mellitus without complications: Secondary | ICD-10-CM | POA: Diagnosis not present

## 2016-11-27 NOTE — Telephone Encounter (Signed)
Pt wife is calling back stating that pt PCP will not release pt to drive.

## 2016-11-27 NOTE — Patient Instructions (Addendum)
We will place a referral to cardiac rehab CAD, obstruction of RCA, Diastolic CHF, chronic  Medication Instructions:   No medication changes made  Lasix as needed for leg swelling, bad shortness of breath  Labwork:  No new labs needed  Testing/Procedures:  No further testing at this time   Follow-Up: It was a pleasure seeing you in the office today. Please call us if you have new issues that need to be addressed before your next appt.  [phone]  Your physician wants you to follow-up in: 6 months.  You will receive a reminder letter in the mail two months in advance. If you don't receive a letter, please call our office to schedule the follow-up appointment.  If you need a refill on your cardiac medications before your next appointment, please call your pharmacy.

## 2016-11-27 NOTE — Telephone Encounter (Signed)
Spoke with patient and he states that he called Dr. Netty Starring and that he would not clear him to drive. Advised patient that since he was the one that placed those restrictions it would need to be him that releases him. He verbalized understanding but wanted to see what could be done. Will route message to Dr. Rockey Situ to make him aware of patients wishes.

## 2016-11-27 NOTE — Telephone Encounter (Signed)
Patient wife calling about 2 wk mail order monitor (talked with Rockey Situ about this)  Please call to discuss

## 2016-12-04 ENCOUNTER — Other Ambulatory Visit: Payer: Self-pay | Admitting: *Deleted

## 2016-12-14 ENCOUNTER — Telehealth: Payer: Self-pay | Admitting: Cardiovascular Disease

## 2016-12-14 ENCOUNTER — Encounter: Payer: PPO | Attending: Cardiovascular Disease | Admitting: *Deleted

## 2016-12-14 ENCOUNTER — Encounter: Payer: Self-pay | Admitting: *Deleted

## 2016-12-14 VITALS — Ht 68.5 in | Wt 198.1 lb

## 2016-12-14 DIAGNOSIS — E1140 Type 2 diabetes mellitus with diabetic neuropathy, unspecified: Secondary | ICD-10-CM | POA: Diagnosis not present

## 2016-12-14 DIAGNOSIS — I208 Other forms of angina pectoris: Secondary | ICD-10-CM | POA: Diagnosis present

## 2016-12-14 DIAGNOSIS — Z7982 Long term (current) use of aspirin: Secondary | ICD-10-CM | POA: Insufficient documentation

## 2016-12-14 DIAGNOSIS — Z794 Long term (current) use of insulin: Secondary | ICD-10-CM | POA: Diagnosis not present

## 2016-12-14 DIAGNOSIS — Z79899 Other long term (current) drug therapy: Secondary | ICD-10-CM | POA: Diagnosis not present

## 2016-12-14 DIAGNOSIS — I2089 Other forms of angina pectoris: Secondary | ICD-10-CM

## 2016-12-14 DIAGNOSIS — I10 Essential (primary) hypertension: Secondary | ICD-10-CM | POA: Insufficient documentation

## 2016-12-14 DIAGNOSIS — E785 Hyperlipidemia, unspecified: Secondary | ICD-10-CM | POA: Diagnosis not present

## 2016-12-14 DIAGNOSIS — I25118 Atherosclerotic heart disease of native coronary artery with other forms of angina pectoris: Secondary | ICD-10-CM | POA: Diagnosis not present

## 2016-12-14 NOTE — Progress Notes (Signed)
Cardiac Individual Treatment Plan  Patient Details  Name: Ronald Wallace MRN: [id number] Date of Birth: [birth year] Referring Provider:    Initial Encounter Date: 12/14/2016  Visit Diagnosis: No diagnosis found.  Patient's Home Medications on Admission:  Current Outpatient Medications:  .  aspirin 81 MG tablet, Take 81 mg by mouth daily., Disp: , Rfl:  .  calcium carbonate (OS-CAL) 600 MG TABS tablet, Take 600 mg by mouth daily with breakfast., Disp: , Rfl:  .  cholecalciferol (VITAMIN D) 1000 UNITS tablet, Take 1,000 Units by mouth daily., Disp: , Rfl:  .  Ferrous Sulfate (IRON) 325 (65 FE) MG TABS, Take 1 tablet by mouth daily. , Disp: , Rfl:  .  gabapentin (NEURONTIN) 300 MG capsule, Take 600 mg by mouth at bedtime., Disp: , Rfl:  .  isosorbide mononitrate (IMDUR) 30 MG 24 hr tablet, Take 1 tablet (30 mg total) by mouth daily., Disp: 90 tablet, Rfl: 1 .  LANTUS 100 UNIT/ML injection, Inject 42 Units into the skin daily. Take at bedtime, Disp: , Rfl:  .  metFORMIN (GLUCOPHAGE) 500 MG tablet, Take 250 mg by mouth 2 (two) times daily with a meal., Disp: , Rfl:  .  NOVOLOG 100 UNIT/ML injection, Inject into the skin 3 (three) times daily with meals. 12 units am, 12 units lunch, 14 units supper, Disp: , Rfl:  .  simvastatin (ZOCOR) 20 MG tablet, Take 20 mg by mouth daily., Disp: , Rfl:  .  tamsulosin (FLOMAX) 0.4 MG CAPS capsule, Take 0.4 mg by mouth daily., Disp: , Rfl:  .  furosemide (LASIX) 20 MG tablet, Take 1 tablet (20 mg total) by mouth as needed for fluid (for shortness of breath)., Disp: 30 tablet, Rfl: 6 .  hydrochlorothiazide (MICROZIDE) 12.5 MG capsule, Take 12.5 mg by mouth daily. , Disp: , Rfl:  .  lisinopril (PRINIVIL,ZESTRIL) 20 MG tablet, Take 20 mg by mouth daily., Disp: , Rfl:   Past Medical History: Past Medical History:  Diagnosis Date  . Anemia   . CAD (coronary artery disease)    a. 02/2014 Neg Ex MV, EF 65%;  b. 06/2015 Abnl MV ([facility])-->Cath ([facility]): LM  nl, LAD 30p/m, LCX 30p, OM2 50p, RCA 100p w/ L-->R collats, nl EF-->Med Rx.  . Diabetes mellitus, type II, insulin dependent ([place])    With neuropathy  . Diverticulitis   . Essential hypertension   . Hyperlipidemia   . Neuropathy   . Shortness of breath dyspnea   . Syncope and collapse    a. 02/2014 Carotid Dopplers: 1-39% bilateral ICA with normal Subclavian & vertebral arteries; b. 02/2014 2 D Echo: EF 50-55%, Gr 1 DD, Mild MR & LA dilation -- was bradycardic in 40s.    Tobacco Use: Social History   Tobacco Use  Smoking Status Never Smoker  Smokeless Tobacco Never Used    Labs: Recent Review Flowsheet Data    There is no flowsheet data to display.       Exercise Target Goals:    Exercise Program Goal: Individual exercise prescription set with THRR, safety & activity barriers. Participant demonstrates ability to understand and report RPE using BORG scale, to self-measure pulse accurately, and to acknowledge the importance of the exercise prescription.  Exercise Prescription Goal: Starting with aerobic activity 30 plus minutes a day, 3 days per week for initial exercise prescription. Provide home exercise prescription and guidelines that participant acknowledges understanding prior to discharge.  Activity Barriers & Risk Stratification: Activity Barriers & Cardiac Risk Stratification -  12/14/16 1319      Activity Barriers & Cardiac Risk Stratification   Activity Barriers  Arthritis;Chest Pain/Angina;Neck/Spine Problems    Cardiac Risk Stratification  High       6 Minute Walk:   Oxygen Initial Assessment:   Oxygen Re-Evaluation:   Oxygen Discharge (Final Oxygen Re-Evaluation):   Initial Exercise Prescription:   Perform Capillary Blood Glucose checks as needed.  Exercise Prescription Changes:   Exercise Comments:   Exercise Goals and Review:   Exercise Goals Re-Evaluation :   Discharge Exercise Prescription (Final Exercise Prescription  Changes):   Nutrition:  Target Goals: Understanding of nutrition guidelines, daily intake of sodium 1500mg , cholesterol 200mg , calories 30% from fat and 7% or less from saturated fats, daily to have 5 or more servings of fruits and vegetables.  Biometrics:    Nutrition Therapy Plan and Nutrition Goals: Nutrition Therapy & Goals - 12/14/16 1320      Nutrition Therapy   RD appointment defered  Yes    Drug/Food Interactions  Statins/Certain Fruits       Nutrition Discharge: Rate Your Plate Scores: Nutrition Assessments - 12/14/16 1320      MEDFICTS Scores   Pre Score  20       Nutrition Goals Re-Evaluation:   Nutrition Goals Discharge (Final Nutrition Goals Re-Evaluation):   Psychosocial: Target Goals: Acknowledge presence or absence of significant depression and/or stress, maximize coping skills, provide positive support system. Participant is able to verbalize types and ability to use techniques and skills needed for reducing stress and depression.   Initial Review & Psychosocial Screening: Initial Psych Review & Screening - 12/14/16 1313      Initial Review   Current issues with  Current Sleep Concerns;Current Stress Concerns    Source of Stress Concerns  Chronic Illness      Family Dynamics   Good Support System?  Yes      Barriers   Psychosocial barriers to participate in program  The patient should benefit from training in stress management and relaxation.      Screening Interventions   Interventions  Encouraged to exercise;Yes;Program counselor consult    Expected Outcomes  Short Term goal: Utilizing psychosocial counselor, staff and physician to assist with identification of specific Stressors or current issues interfering with healing process. Setting desired goal for each stressor or current issue identified.;Long Term Goal: Stressors or current issues are controlled or eliminated.;Short Term goal: Identification and review with participant of any Quality  of Life or Depression concerns found by scoring the questionnaire.;Long Term goal: The participant improves quality of Life and PHQ9 Scores as seen by post scores and/or verbalization of changes       Quality of Life Scores:  Quality of Life - 12/14/16 1319      Quality of Life Scores   GLOBAL Pre  17.18 %       PHQ-9: Recent Review Flowsheet Data    Depression screen [facility] 2/9 12/14/2016   Decreased Interest 0   Down, Depressed, Hopeless 2   PHQ - 2 Score 2   Altered sleeping 3   Tired, decreased energy 2   Change in appetite 0   Feeling bad or failure about yourself  1   Trouble concentrating 3   Moving slowly or fidgety/restless 0   Suicidal thoughts 1   PHQ-9 Score 12     Interpretation of Total Score  Total Score Depression Severity:  1-4 = Minimal depression, 5-9 = Mild depression, 10-14 = Moderate depression, 15-19 =  Moderately severe depression, 20-27 = Severe depression   Psychosocial Evaluation and Intervention:   Psychosocial Re-Evaluation:   Psychosocial Discharge (Final Psychosocial Re-Evaluation):   Vocational Rehabilitation: Provide vocational rehab assistance to qualifying candidates.   Vocational Rehab Evaluation & Intervention:   Education: Education Goals: Education classes will be provided on a variety of topics geared toward better understanding of heart health and risk factor modification. Participant will state understanding/return demonstration of topics presented as noted by education test scores.  Learning Barriers/Preferences:   Education Topics: General Nutrition Guidelines/Fats and Fiber: -Group instruction provided by verbal, written material, models and posters to present the general guidelines for heart healthy nutrition. Gives an explanation and review of dietary fats and fiber.   Controlling Sodium/Reading Food Labels: -Group verbal and written material supporting the discussion of sodium use in heart healthy nutrition. Review  and explanation with models, verbal and written materials for utilization of the food label.   Exercise Physiology & Risk Factors: - Group verbal and written instruction with models to review the exercise physiology of the cardiovascular system and associated critical values. Details cardiovascular disease risk factors and the goals associated with each risk factor.   Aerobic Exercise & Resistance Training: - Gives group verbal and written discussion on the health impact of inactivity. On the components of aerobic and resistive training programs and the benefits of this training and how to safely progress through these programs.   Flexibility, Balance, General Exercise Guidelines: - Provides group verbal and written instruction on the benefits of flexibility and balance training programs. Provides general exercise guidelines with specific guidelines to those with heart or lung disease. Demonstration and skill practice provided.   Stress Management: - Provides group verbal and written instruction about the health risks of elevated stress, cause of high stress, and healthy ways to reduce stress.   Depression: - Provides group verbal and written instruction on the correlation between heart/lung disease and depressed mood, treatment options, and the stigmas associated with seeking treatment.   Anatomy & Physiology of the Heart: - Group verbal and written instruction and models provide basic cardiac anatomy and physiology, with the coronary electrical and arterial systems. Review of: AMI, Angina, Valve disease, Heart Failure, Cardiac Arrhythmia, Pacemakers, and the ICD.   Cardiac Procedures: - Group verbal and written instruction to review commonly prescribed medications for heart disease. Reviews the medication, class of the drug, and side effects. Includes the steps to properly store meds and maintain the prescription regimen. (beta blockers and nitrates)   Cardiac Medications I: - Group  verbal and written instruction to review commonly prescribed medications for heart disease. Reviews the medication, class of the drug, and side effects. Includes the steps to properly store meds and maintain the prescription regimen.   Cardiac Medications II: -Group verbal and written instruction to review commonly prescribed medications for heart disease. Reviews the medication, class of the drug, and side effects. (all other drug classes)    Go Sex-Intimacy & Heart Disease, Get SMART - Goal Setting: - Group verbal and written instruction through game format to discuss heart disease and the return to sexual intimacy. Provides group verbal and written material to discuss and apply goal setting through the application of the S.M.A.R.T. Method.   Other Matters of the Heart: - Provides group verbal, written materials and models to describe Heart Failure, Angina, Valve Disease, Peripheral Artery Disease, and Diabetes in the realm of heart disease. Includes description of the disease process and treatment options available to the cardiac  patient.   Exercise & Equipment Safety: - Individual verbal instruction and demonstration of equipment use and safety with use of the equipment.   Infection Prevention: - Provides verbal and written material to individual with discussion of infection control including proper hand washing and proper equipment cleaning during exercise session.   Falls Prevention: - Provides verbal and written material to individual with discussion of falls prevention and safety.   Diabetes: - Individual verbal and written instruction to review signs/symptoms of diabetes, desired ranges of glucose level fasting, after meals and with exercise. Acknowledge that pre and post exercise glucose checks will be done for 3 sessions at entry of program.   Other: -Provides group and verbal instruction on various topics (see comments)    Knowledge Questionnaire Score:   Core  Components/Risk Factors/Patient Goals at Admission: Personal Goals and Risk Factors at Admission - 12/14/16 1322      Core Components/Risk Factors/Patient Goals on Admission   Hypertension  Yes    Intervention  Provide education on lifestyle modifcations including regular physical activity/exercise, weight management, moderate sodium restriction and increased consumption of fresh fruit, vegetables, and low fat dairy, alcohol moderation, and smoking cessation.;Monitor prescription use compliance.    Expected Outcomes  Short Term: Continued assessment and intervention until BP is < 140/46mm HG in hypertensive participants. < 130/56mm HG in hypertensive participants with diabetes, heart failure or chronic kidney disease.;Long Term: Maintenance of blood pressure at goal levels.       Core Components/Risk Factors/Patient Goals Review:    Core Components/Risk Factors/Patient Goals at Discharge (Final Review):    ITP Comments: ITP Comments    Row Name 12/14/16 1321 12/14/16 1324         ITP Comments  ITP created during Medical Review/Orientation appt after Cardiac REhab informed consent was signed.   Stable angina note documented in Dr. Donivan Scull 11/27/2016 note         Comments:

## 2016-12-14 NOTE — Telephone Encounter (Signed)
No answer. No voicemail. 

## 2016-12-14 NOTE — Telephone Encounter (Signed)
Patient wife wants to know if he should be wearing a holter monitor as per discussion with gollan at the last ov    Pt c/o medication issue:  1. Name of Medication: nitroglycerin  2. How are you currently taking this medication (dosage and times per day)? Prn   3. Are you having a reaction (difficulty breathing--STAT)? No   4. What is your medication issue? Patient wants to know if he is supposed to take this for sob episodes

## 2016-12-14 NOTE — Progress Notes (Signed)
Cardiac Individual Treatment Plan  Patient Details  Name: Ronald Wallace MRN: [id number] Date of Birth: 08/03/1940 Referring Provider:     Cardiac Rehab from 12/14/2016 in [facility] Re[facility]  Referring Provider  [name]      Initial Encounter Date:    Cardiac Rehab from 12/14/2016 in [facility] Cardiac and Pulmonary Rehab  Date  12/14/16  Referring Provider  Rockey Situ      Visit Diagnosis: No diagnosis found.  Patient's Home Medications on Admission:  Current Outpatient Medications:  .  aspirin 81 MG tablet, Take 81 mg by mouth daily., Disp: , Rfl:  .  calcium carbonate (OS-CAL) 600 MG TABS tablet, Take 600 mg by mouth daily with breakfast., Disp: , Rfl:  .  cholecalciferol (VITAMIN D) 1000 UNITS tablet, Take 1,000 Units by mouth daily., Disp: , Rfl:  .  Ferrous Sulfate (IRON) 325 (65 FE) MG TABS, Take 1 tablet by mouth daily. , Disp: , Rfl:  .  gabapentin (NEURONTIN) 300 MG capsule, Take 600 mg by mouth at bedtime., Disp: , Rfl:  .  isosorbide mononitrate (IMDUR) 30 MG 24 hr tablet, Take 1 tablet (30 mg total) by mouth daily., Disp: 90 tablet, Rfl: 1 .  LANTUS 100 UNIT/ML injection, Inject 42 Units into the skin daily. Take at bedtime, Disp: , Rfl:  .  metFORMIN (GLUCOPHAGE) 500 MG tablet, Take 250 mg by mouth 2 (two) times daily with a meal., Disp: , Rfl:  .  NOVOLOG 100 UNIT/ML injection, Inject into the skin 3 (three) times daily with meals. 12 units am, 12 units lunch, 14 units supper, Disp: , Rfl:  .  simvastatin (ZOCOR) 20 MG tablet, Take 20 mg by mouth daily., Disp: , Rfl:  .  tamsulosin (FLOMAX) 0.4 MG CAPS capsule, Take 0.4 mg by mouth daily., Disp: , Rfl:  .  furosemide (LASIX) 20 MG tablet, Take 1 tablet (20 mg total) by mouth as needed for fluid (for shortness of breath)., Disp: 30 tablet, Rfl: 6 .  hydrochlorothiazide (MICROZIDE) 12.5 MG capsule, Take 12.5 mg by mouth daily. , Disp: , Rfl:  .  lisinopril (PRINIVIL,ZESTRIL) 20 MG tablet, Take 20 mg by mouth  daily., Disp: , Rfl:   Past Medical History: Past Medical History:  Diagnosis Date  . Anemia   . CAD (coronary artery disease)    a. 02/2014 Neg Ex MV, EF 65%;  b. 06/2015 Abnl MV ([facility])-->Cath ([facility]): LM nl, LAD 30p/m, LCX 30p, OM2 50p, RCA 100p w/ L-->R collats, nl EF-->Med Rx.  . Diabetes mellitus, type II, insulin dependent ([place])    With neuropathy  . Diverticulitis   . Essential hypertension   . Hyperlipidemia   . Neuropathy   . Shortness of breath dyspnea   . Syncope and collapse    a. 02/2014 Carotid Dopplers: 1-39% bilateral ICA with normal Subclavian & vertebral arteries; b. 02/2014 2 D Echo: EF 50-55%, Gr 1 DD, Mild MR & LA dilation -- was bradycardic in 40s.    Tobacco Use: Social History   Tobacco Use  Smoking Status Never Smoker  Smokeless Tobacco Never Used    Labs: Recent Review Flowsheet Data    There is no flowsheet data to display.       Exercise Target Goals: Date: 12/14/16  Exercise Program Goal: Individual exercise prescription set with THRR, safety & activity barriers. Participant demonstrates ability to understand and report RPE using BORG scale, to self-measure pulse accurately, and to acknowledge the importance of the exercise prescription.  Exercise Prescription Goal: Starting with aerobic activity 30 plus minutes a day, 3 days per week for initial exercise prescription. Provide home exercise prescription and guidelines that participant acknowledges understanding prior to discharge.  Activity Barriers & Risk Stratification: Activity Barriers & Cardiac Risk Stratification - 12/14/16 1319      Activity Barriers & Cardiac Risk Stratification   Activity Barriers  Arthritis;Chest Pain/Angina;Neck/Spine Problems    Cardiac Risk Stratification  High       6 Minute Walk: 6 Minute Walk    Row Name 12/14/16 1501         6 Minute Walk   Distance  1488 feet     Walk Time  6 minutes     # of Rest Breaks  0     MPH  2.82     METS  3.25       RPE  9     Perceived Dyspnea   3     VO2 Peak  11.36     Symptoms  No     Resting HR  63 bpm     Resting BP  132/64     Resting Oxygen Saturation   96 %     Exercise Oxygen Saturation  during 6 min walk  95 %     Max Ex. HR  129 bpm     Max Ex. BP  162/64     2 Minute Post BP  130/60        Oxygen Initial Assessment:   Oxygen Re-Evaluation:   Oxygen Discharge (Final Oxygen Re-Evaluation):   Initial Exercise Prescription: Initial Exercise Prescription - 12/14/16 1500      Date of Initial Exercise RX and Referring Provider   Date  12/14/16    Referring Provider  [name]      Treadmill   MPH  2.7    Grade  0.5    Minutes  15    METs  3.25      NuStep   Level  3    SPM  80    Minutes  15    METs  3.2      REL-XR   Level  2    Speed  50    Minutes  15    METs  3.25      Prescription Details   Frequency (times per week)  3    Duration  Progress to 45 minutes of aerobic exercise without signs/symptoms of physical distress      Intensity   THRR 40-80% of Max Heartrate  96-129    Ratings of Perceived Exertion  11-13    Perceived Dyspnea  0-4      Resistance Training   Training Prescription  Yes    Weight  3 lb    Reps  10-15       Perform Capillary Blood Glucose checks as needed.  Exercise Prescription Changes:   Exercise Comments:   Exercise Goals and Review: Exercise Goals    Row Name 12/14/16 1500             Exercise Goals   Increase Physical Activity  Yes       Intervention  Provide advice, education, support and counseling about physical activity/exercise needs.;Develop an individualized exercise prescription for aerobic and resistive training based on initial evaluation findings, risk stratification, comorbidities and participant's personal goals.       Expected Outcomes  Achievement of increased cardiorespiratory fitness and enhanced flexibility, muscular endurance and strength shown  through measurements of functional capacity and  personal statement of participant.       Increase Strength and Stamina  Yes       Intervention  Provide advice, education, support and counseling about physical activity/exercise needs.;Develop an individualized exercise prescription for aerobic and resistive training based on initial evaluation findings, risk stratification, comorbidities and participant's personal goals.       Expected Outcomes  Achievement of increased cardiorespiratory fitness and enhanced flexibility, muscular endurance and strength shown through measurements of functional capacity and personal statement of participant.       Able to understand and use rate of perceived exertion (RPE) scale  Yes       Intervention  Provide education and explanation on how to use RPE scale       Expected Outcomes  Short Term: Able to use RPE daily in rehab to express subjective intensity level;Long Term:  Able to use RPE to guide intensity level when exercising independently       Able to understand and use Dyspnea scale  Yes       Intervention  Provide education and explanation on how to use Dyspnea scale       Expected Outcomes  Short Term: Able to use Dyspnea scale daily in rehab to express subjective sense of shortness of breath during exertion;Long Term: Able to use Dyspnea scale to guide intensity level when exercising independently       Knowledge and understanding of Target Heart Rate Range (THRR)  Yes       Intervention  Provide education and explanation of THRR including how the numbers were predicted and where they are located for reference       Expected Outcomes  Short Term: Able to state/look up THRR;Long Term: Able to use THRR to govern intensity when exercising independently;Short Term: Able to use daily as guideline for intensity in rehab       Able to check pulse independently  Yes       Intervention  Provide education and demonstration on how to check pulse in carotid and radial arteries.;Review the importance of being able to  check your own pulse for safety during independent exercise       Expected Outcomes  Short Term: Able to explain why pulse checking is important during independent exercise;Long Term: Able to check pulse independently and accurately       Understanding of Exercise Prescription  Yes       Intervention  Provide education, explanation, and written materials on patient's individual exercise prescription       Expected Outcomes  Short Term: Able to explain program exercise prescription;Long Term: Able to explain home exercise prescription to exercise independently          Exercise Goals Re-Evaluation :   Discharge Exercise Prescription (Final Exercise Prescription Changes):   Nutrition:  Target Goals: Understanding of nutrition guidelines, daily intake of sodium 1500mg , cholesterol 200mg , calories 30% from fat and 7% or less from saturated fats, daily to have 5 or more servings of fruits and vegetables.  Biometrics: Pre Biometrics - 12/14/16 1459      Pre Biometrics   Height  5' 8.5" (1.74 m)    Weight  198 lb 1.6 oz (89.9 kg)    Waist Circumference  41.5 inches    Hip Circumference  40 inches    Waist to Hip Ratio  1.04 %    BMI (Calculated)  29.68    Single Leg Stand  20.27 seconds  Nutrition Therapy Plan and Nutrition Goals: Nutrition Therapy & Goals - 12/14/16 1320      Nutrition Therapy   RD appointment defered  Yes    Drug/Food Interactions  Statins/Certain Fruits       Nutrition Discharge: Rate Your Plate Scores: Nutrition Assessments - 12/14/16 1320      MEDFICTS Scores   Pre Score  20       Nutrition Goals Re-Evaluation:   Nutrition Goals Discharge (Final Nutrition Goals Re-Evaluation):   Psychosocial: Target Goals: Acknowledge presence or absence of significant depression and/or stress, maximize coping skills, provide positive support system. Participant is able to verbalize types and ability to use techniques and skills needed for reducing stress  and depression.   Initial Review & Psychosocial Screening: Initial Psych Review & Screening - 12/14/16 1313      Initial Review   Current issues with  Current Sleep Concerns;Current Stress Concerns    Source of Stress Concerns  Chronic Illness      Family Dynamics   Good Support System?  Yes      Barriers   Psychosocial barriers to participate in program  The patient should benefit from training in stress management and relaxation.      Screening Interventions   Interventions  Encouraged to exercise;Yes;Program counselor consult    Expected Outcomes  Short Term goal: Utilizing psychosocial counselor, staff and physician to assist with identification of specific Stressors or current issues interfering with healing process. Setting desired goal for each stressor or current issue identified.;Long Term Goal: Stressors or current issues are controlled or eliminated.;Short Term goal: Identification and review with participant of any Quality of Life or Depression concerns found by scoring the questionnaire.;Long Term goal: The participant improves quality of Life and PHQ9 Scores as seen by post scores and/or verbalization of changes       Quality of Life Scores:  Quality of Life - 12/14/16 1319      Quality of Life Scores   GLOBAL Pre  17.18 %       PHQ-9: Recent Review Flowsheet Data    Depression screen [facility] 2/9 12/14/2016 12/14/2016   Decreased Interest - 0   Down, Depressed, Hopeless - 2   PHQ - 2 Score - 2   Altered sleeping - 3   Tired, decreased energy - 2   Change in appetite - 0   Feeling bad or failure about yourself  - 1   Trouble concentrating - 3   Moving slowly or fidgety/restless - 0   Suicidal thoughts 0  1   PHQ-9 Score - 12     Interpretation of Total Score  Total Score Depression Severity:  1-4 = Minimal depression, 5-9 = Mild depression, 10-14 = Moderate depression, 15-19 = Moderately severe depression, 20-27 = Severe depression   Psychosocial Evaluation  and Intervention:   Psychosocial Re-Evaluation:   Psychosocial Discharge (Final Psychosocial Re-Evaluation):   Vocational Rehabilitation: Provide vocational rehab assistance to qualifying candidates.   Vocational Rehab Evaluation & Intervention:   Education: Education Goals: Education classes will be provided on a variety of topics geared toward better understanding of heart health and risk factor modification. Participant will state understanding/return demonstration of topics presented as noted by education test scores.  Learning Barriers/Preferences:   Education Topics: General Nutrition Guidelines/Fats and Fiber: -Group instruction provided by verbal, written material, models and posters to present the general guidelines for heart healthy nutrition. Gives an explanation and review of dietary fats and fiber.   Controlling Sodium/Reading  Food Labels: -Group verbal and written material supporting the discussion of sodium use in heart healthy nutrition. Review and explanation with models, verbal and written materials for utilization of the food label.   Exercise Physiology & Risk Factors: - Group verbal and written instruction with models to review the exercise physiology of the cardiovascular system and associated critical values. Details cardiovascular disease risk factors and the goals associated with each risk factor.   Aerobic Exercise & Resistance Training: - Gives group verbal and written discussion on the health impact of inactivity. On the components of aerobic and resistive training programs and the benefits of this training and how to safely progress through these programs.   Flexibility, Balance, General Exercise Guidelines: - Provides group verbal and written instruction on the benefits of flexibility and balance training programs. Provides general exercise guidelines with specific guidelines to those with heart or lung disease. Demonstration and skill practice  provided.   Stress Management: - Provides group verbal and written instruction about the health risks of elevated stress, cause of high stress, and healthy ways to reduce stress.   Depression: - Provides group verbal and written instruction on the correlation between heart/lung disease and depressed mood, treatment options, and the stigmas associated with seeking treatment.   Anatomy & Physiology of the Heart: - Group verbal and written instruction and models provide basic cardiac anatomy and physiology, with the coronary electrical and arterial systems. Review of: AMI, Angina, Valve disease, Heart Failure, Cardiac Arrhythmia, Pacemakers, and the ICD.   Cardiac Procedures: - Group verbal and written instruction to review commonly prescribed medications for heart disease. Reviews the medication, class of the drug, and side effects. Includes the steps to properly store meds and maintain the prescription regimen. (beta blockers and nitrates)   Cardiac Medications I: - Group verbal and written instruction to review commonly prescribed medications for heart disease. Reviews the medication, class of the drug, and side effects. Includes the steps to properly store meds and maintain the prescription regimen.   Cardiac Medications II: -Group verbal and written instruction to review commonly prescribed medications for heart disease. Reviews the medication, class of the drug, and side effects. (all other drug classes)    Go Sex-Intimacy & Heart Disease, Get SMART - Goal Setting: - Group verbal and written instruction through game format to discuss heart disease and the return to sexual intimacy. Provides group verbal and written material to discuss and apply goal setting through the application of the S.M.A.R.T. Method.   Other Matters of the Heart: - Provides group verbal, written materials and models to describe Heart Failure, Angina, Valve Disease, Peripheral Artery Disease, and Diabetes in  the realm of heart disease. Includes description of the disease process and treatment options available to the cardiac patient.   Exercise & Equipment Safety: - Individual verbal instruction and demonstration of equipment use and safety with use of the equipment.   Infection Prevention: - Provides verbal and written material to individual with discussion of infection control including proper hand washing and proper equipment cleaning during exercise session.   Falls Prevention: - Provides verbal and written material to individual with discussion of falls prevention and safety.   Diabetes: - Individual verbal and written instruction to review signs/symptoms of diabetes, desired ranges of glucose level fasting, after meals and with exercise. Acknowledge that pre and post exercise glucose checks will be done for 3 sessions at entry of program.   Other: -Provides group and verbal instruction on various topics (see comments)  Knowledge Questionnaire Score:   Core Components/Risk Factors/Patient Goals at Admission: Personal Goals and Risk Factors at Admission - 12/14/16 1322      Core Components/Risk Factors/Patient Goals on Admission   Hypertension  Yes    Intervention  Provide education on lifestyle modifcations including regular physical activity/exercise, weight management, moderate sodium restriction and increased consumption of fresh fruit, vegetables, and low fat dairy, alcohol moderation, and smoking cessation.;Monitor prescription use compliance.    Expected Outcomes  Short Term: Continued assessment and intervention until BP is < 140/36mm HG in hypertensive participants. < 130/56mm HG in hypertensive participants with diabetes, heart failure or chronic kidney disease.;Long Term: Maintenance of blood pressure at goal levels.       Core Components/Risk Factors/Patient Goals Review:    Core Components/Risk Factors/Patient Goals at Discharge (Final Review):    ITP  Comments: ITP Comments    Row Name 12/14/16 1321 12/14/16 1324         ITP Comments  ITP created during Medical Review/Orientation appt after Cardiac REhab informed consent was signed.   Stable angina note documented in Dr. Donivan Scull 11/27/2016 note         Comments:

## 2016-12-14 NOTE — Progress Notes (Signed)
Daily Session Note  Patient Details  Name: Ronald Wallace MRN: [id number] Date of Birth: 1940/08/02 Referring Provider:  Rockey Situ  Encounter Date: 12/14/2016  Check In: Session Check In - 12/14/16 1316      Check-In   Location  [facility]-Cardiac & Pulmonary Rehab    Staff Present  Gerlene Burdock, RN, Geralyn Corwin, RN Vickki Hearing, BA, ACSM CEP, Exercise Physiologist;Jessica [place], [place], ACSM RCEP, Exercise Physiologist    Supervising physician immediately available to respond to emergencies  See telemetry face sheet for immediately available ER MD    Medication changes reported      No    Fall or balance concerns reported     No    Tobacco Cessation  No Change    Warm-up and Cool-down  Performed as group-led instruction    Resistance Training Performed  Yes    VAD Patient?  No      Pain Assessment   Currently in Pain?  No/denies          Social History   Tobacco Use  Smoking Status Never Smoker  Smokeless Tobacco Never Used    Goals Met:  Proper associated with RPD/PD & O2 Sat Exercise tolerated well Personal goals reviewed No report of cardiac concerns or symptoms Strength training completed today  Goals Unmet:  Not Applicable  Comments:     Dr. Emily Filbert is Medical Director for [facility].

## 2016-12-15 NOTE — Telephone Encounter (Signed)
Spoke with patients wife per release form and reviewed last office visit note with her. Reviewed that patient was given furosemide to take as needed for shortness of breath and cardiac rehab was ordered. She thought heart monitoring was mentioned and advised that it may have been when he was discussing cardiac rehab and that they put a monitor on during his appointments. She confirmed that was probably what it was and advised to please let us know if she has any further questions. She verbalized understanding of our conversation, agreement with plan, and had no further questions at this time.

## 2016-12-18 DIAGNOSIS — L905 Scar conditions and fibrosis of skin: Secondary | ICD-10-CM | POA: Diagnosis not present

## 2016-12-18 DIAGNOSIS — C4442 Squamous cell carcinoma of skin of scalp and neck: Secondary | ICD-10-CM | POA: Diagnosis not present

## 2016-12-20 ENCOUNTER — Encounter: Payer: PPO | Attending: Cardiovascular Disease

## 2016-12-20 ENCOUNTER — Encounter: Payer: Self-pay | Admitting: *Deleted

## 2016-12-20 DIAGNOSIS — Z7982 Long term (current) use of aspirin: Secondary | ICD-10-CM | POA: Insufficient documentation

## 2016-12-20 DIAGNOSIS — I10 Essential (primary) hypertension: Secondary | ICD-10-CM | POA: Insufficient documentation

## 2016-12-20 DIAGNOSIS — E785 Hyperlipidemia, unspecified: Secondary | ICD-10-CM | POA: Insufficient documentation

## 2016-12-20 DIAGNOSIS — I208 Other forms of angina pectoris: Secondary | ICD-10-CM

## 2016-12-20 DIAGNOSIS — Z79899 Other long term (current) drug therapy: Secondary | ICD-10-CM | POA: Insufficient documentation

## 2016-12-20 DIAGNOSIS — E1140 Type 2 diabetes mellitus with diabetic neuropathy, unspecified: Secondary | ICD-10-CM | POA: Insufficient documentation

## 2016-12-20 DIAGNOSIS — Z794 Long term (current) use of insulin: Secondary | ICD-10-CM | POA: Insufficient documentation

## 2016-12-20 DIAGNOSIS — I25118 Atherosclerotic heart disease of native coronary artery with other forms of angina pectoris: Secondary | ICD-10-CM | POA: Insufficient documentation

## 2016-12-20 NOTE — Progress Notes (Signed)
Cardiac Individual Treatment Plan  Patient Details  Name: Ronald Wallace MRN: [id number] Date of Birth: 04-30-1940 Referring Provider:     Cardiac Rehab from 12/14/2016 in [facility] Cardiac and Pulmonary Rehab  Referring Provider  [name]      Initial Encounter Date:    Cardiac Rehab from 12/14/2016 in [facility] Cardiac and Pulmonary Rehab  Date  12/14/16  Referring Provider  Rockey Situ      Visit Diagnosis: Stable angina ([place])  Patient's Home Medications on Admission:  Current Outpatient Medications:  .  aspirin 81 MG tablet, Take 81 mg by mouth daily., Disp: , Rfl:  .  calcium carbonate (OS-CAL) 600 MG TABS tablet, Take 600 mg by mouth daily with breakfast., Disp: , Rfl:  .  cholecalciferol (VITAMIN D) 1000 UNITS tablet, Take 1,000 Units by mouth daily., Disp: , Rfl:  .  Ferrous Sulfate (IRON) 325 (65 FE) MG TABS, Take 1 tablet by mouth daily. , Disp: , Rfl:  .  furosemide (LASIX) 20 MG tablet, Take 1 tablet (20 mg total) by mouth as needed for fluid (for shortness of breath)., Disp: 30 tablet, Rfl: 6 .  gabapentin (NEURONTIN) 300 MG capsule, Take 600 mg by mouth at bedtime., Disp: , Rfl:  .  hydrochlorothiazide (MICROZIDE) 12.5 MG capsule, Take 12.5 mg by mouth daily. , Disp: , Rfl:  .  isosorbide mononitrate (IMDUR) 30 MG 24 hr tablet, Take 1 tablet (30 mg total) by mouth daily., Disp: 90 tablet, Rfl: 1 .  LANTUS 100 UNIT/ML injection, Inject 42 Units into the skin daily. Take at bedtime, Disp: , Rfl:  .  lisinopril (PRINIVIL,ZESTRIL) 20 MG tablet, Take 20 mg by mouth daily., Disp: , Rfl:  .  metFORMIN (GLUCOPHAGE) 500 MG tablet, Take 250 mg by mouth 2 (two) times daily with a meal., Disp: , Rfl:  .  NOVOLOG 100 UNIT/ML injection, Inject into the skin 3 (three) times daily with meals. 12 units am, 12 units lunch, 14 units supper, Disp: , Rfl:  .  simvastatin (ZOCOR) 20 MG tablet, Take 20 mg by mouth daily., Disp: , Rfl:  .  tamsulosin (FLOMAX) 0.4 MG CAPS capsule, Take 0.4 mg by mouth  daily., Disp: , Rfl:   Past Medical History: Past Medical History:  Diagnosis Date  . Anemia   . CAD (coronary artery disease)    a. 02/2014 Neg Ex MV, EF 65%;  b. 06/2015 Abnl MV ([facility])-->Cath ([facility]): LM nl, LAD 30p/m, LCX 30p, OM2 50p, RCA 100p w/ L-->R collats, nl EF-->Med Rx.  . Diabetes mellitus, type II, insulin dependent ([place])    With neuropathy  . Diverticulitis   . Essential hypertension   . Hyperlipidemia   . Neuropathy   . Shortness of breath dyspnea   . Syncope and collapse    a. 02/2014 Carotid Dopplers: 1-39% bilateral ICA with normal Subclavian & vertebral arteries; b. 02/2014 2 D Echo: EF 50-55%, Gr 1 DD, Mild MR & LA dilation -- was bradycardic in 40s.    Tobacco Use: Social History   Tobacco Use  Smoking Status Never Smoker  Smokeless Tobacco Never Used    Labs: Recent Review Flowsheet Data    There is no flowsheet data to display.       Exercise Target Goals:    Exercise Program Goal: Individual exercise prescription set with THRR, safety & activity barriers. Participant demonstrates ability to understand and report RPE using BORG scale, to self-measure pulse accurately, and to acknowledge the importance of the exercise prescription.  Exercise Prescription Goal: Starting with aerobic activity 30 plus minutes a day, 3 days per week for initial exercise prescription. Provide home exercise prescription and guidelines that participant acknowledges understanding prior to discharge.  Activity Barriers & Risk Stratification: Activity Barriers & Cardiac Risk Stratification - 12/14/16 1319      Activity Barriers & Cardiac Risk Stratification   Activity Barriers  Arthritis;Chest Pain/Angina;Neck/Spine Problems    Cardiac Risk Stratification  High       6 Minute Walk: 6 Minute Walk    Row Name 12/14/16 1501         6 Minute Walk   Distance  1488 feet     Walk Time  6 minutes     # of Rest Breaks  0     MPH  2.82     METS  3.25     RPE  9       Perceived Dyspnea   3     VO2 Peak  11.36     Symptoms  No     Resting HR  63 bpm     Resting BP  132/64     Resting Oxygen Saturation   96 %     Exercise Oxygen Saturation  during 6 min walk  95 %     Max Ex. HR  129 bpm     Max Ex. BP  162/64     2 Minute Post BP  130/60        Oxygen Initial Assessment:   Oxygen Re-Evaluation:   Oxygen Discharge (Final Oxygen Re-Evaluation):   Initial Exercise Prescription: Initial Exercise Prescription - 12/14/16 1500      Date of Initial Exercise RX and Referring Provider   Date  12/14/16    Referring Provider  [name]      Treadmill   MPH  2.7    Grade  0.5    Minutes  15    METs  3.25      NuStep   Level  3    SPM  80    Minutes  15    METs  3.2      REL-XR   Level  2    Speed  50    Minutes  15    METs  3.25      Prescription Details   Frequency (times per week)  3    Duration  Progress to 45 minutes of aerobic exercise without signs/symptoms of physical distress      Intensity   THRR 40-80% of Max Heartrate  96-129    Ratings of Perceived Exertion  11-13    Perceived Dyspnea  0-4      Resistance Training   Training Prescription  Yes    Weight  3 lb    Reps  10-15       Perform Capillary Blood Glucose checks as needed.  Exercise Prescription Changes:   Exercise Comments:   Exercise Goals and Review: Exercise Goals    Row Name 12/14/16 1500             Exercise Goals   Increase Physical Activity  Yes       Intervention  Provide advice, education, support and counseling about physical activity/exercise needs.;Develop an individualized exercise prescription for aerobic and resistive training based on initial evaluation findings, risk stratification, comorbidities and participant's personal goals.       Expected Outcomes  Achievement of increased cardiorespiratory fitness and enhanced flexibility, muscular endurance and strength shown  through measurements of functional capacity and personal  statement of participant.       Increase Strength and Stamina  Yes       Intervention  Provide advice, education, support and counseling about physical activity/exercise needs.;Develop an individualized exercise prescription for aerobic and resistive training based on initial evaluation findings, risk stratification, comorbidities and participant's personal goals.       Expected Outcomes  Achievement of increased cardiorespiratory fitness and enhanced flexibility, muscular endurance and strength shown through measurements of functional capacity and personal statement of participant.       Able to understand and use rate of perceived exertion (RPE) scale  Yes       Intervention  Provide education and explanation on how to use RPE scale       Expected Outcomes  Short Term: Able to use RPE daily in rehab to express subjective intensity level;Long Term:  Able to use RPE to guide intensity level when exercising independently       Able to understand and use Dyspnea scale  Yes       Intervention  Provide education and explanation on how to use Dyspnea scale       Expected Outcomes  Short Term: Able to use Dyspnea scale daily in rehab to express subjective sense of shortness of breath during exertion;Long Term: Able to use Dyspnea scale to guide intensity level when exercising independently       Knowledge and understanding of Target Heart Rate Range (THRR)  Yes       Intervention  Provide education and explanation of THRR including how the numbers were predicted and where they are located for reference       Expected Outcomes  Short Term: Able to state/look up THRR;Long Term: Able to use THRR to govern intensity when exercising independently;Short Term: Able to use daily as guideline for intensity in rehab       Able to check pulse independently  Yes       Intervention  Provide education and demonstration on how to check pulse in carotid and radial arteries.;Review the importance of being able to check your  own pulse for safety during independent exercise       Expected Outcomes  Short Term: Able to explain why pulse checking is important during independent exercise;Long Term: Able to check pulse independently and accurately       Understanding of Exercise Prescription  Yes       Intervention  Provide education, explanation, and written materials on patient's individual exercise prescription       Expected Outcomes  Short Term: Able to explain program exercise prescription;Long Term: Able to explain home exercise prescription to exercise independently          Exercise Goals Re-Evaluation :   Discharge Exercise Prescription (Final Exercise Prescription Changes):   Nutrition:  Target Goals: Understanding of nutrition guidelines, daily intake of sodium 1500mg , cholesterol 200mg , calories 30% from fat and 7% or less from saturated fats, daily to have 5 or more servings of fruits and vegetables.  Biometrics: Pre Biometrics - 12/14/16 1459      Pre Biometrics   Height  5' 8.5" (1.74 m)    Weight  198 lb 1.6 oz (89.9 kg)    Waist Circumference  41.5 inches    Hip Circumference  40 inches    Waist to Hip Ratio  1.04 %    BMI (Calculated)  29.68    Single Leg Stand  20.27 seconds  Nutrition Therapy Plan and Nutrition Goals: Nutrition Therapy & Goals - 12/14/16 1320      Nutrition Therapy   RD appointment defered  Yes    Drug/Food Interactions  Statins/Certain Fruits       Nutrition Discharge: Rate Your Plate Scores: Nutrition Assessments - 12/14/16 1320      MEDFICTS Scores   Pre Score  20       Nutrition Goals Re-Evaluation:   Nutrition Goals Discharge (Final Nutrition Goals Re-Evaluation):   Psychosocial: Target Goals: Acknowledge presence or absence of significant depression and/or stress, maximize coping skills, provide positive support system. Participant is able to verbalize types and ability to use techniques and skills needed for reducing stress and  depression.   Initial Review & Psychosocial Screening: Initial Psych Review & Screening - 12/14/16 1313      Initial Review   Current issues with  Current Sleep Concerns;Current Stress Concerns    Source of Stress Concerns  Chronic Illness      Family Dynamics   Good Support System?  Yes      Barriers   Psychosocial barriers to participate in program  The patient should benefit from training in stress management and relaxation.      Screening Interventions   Interventions  Encouraged to exercise;Yes;Program counselor consult    Expected Outcomes  Short Term goal: Utilizing psychosocial counselor, staff and physician to assist with identification of specific Stressors or current issues interfering with healing process. Setting desired goal for each stressor or current issue identified.;Long Term Goal: Stressors or current issues are controlled or eliminated.;Short Term goal: Identification and review with participant of any Quality of Life or Depression concerns found by scoring the questionnaire.;Long Term goal: The participant improves quality of Life and PHQ9 Scores as seen by post scores and/or verbalization of changes       Quality of Life Scores:  Quality of Life - 12/14/16 1319      Quality of Life Scores   GLOBAL Pre  17.18 %       PHQ-9: Recent Review Flowsheet Data    Depression screen [facility] 2/9 12/14/2016 12/14/2016   Decreased Interest - 0   Down, Depressed, Hopeless - 2   PHQ - 2 Score - 2   Altered sleeping - 3   Tired, decreased energy - 2   Change in appetite - 0   Feeling bad or failure about yourself  - 1   Trouble concentrating - 3   Moving slowly or fidgety/restless - 0   Suicidal thoughts 0  1   PHQ-9 Score - 12     Interpretation of Total Score  Total Score Depression Severity:  1-4 = Minimal depression, 5-9 = Mild depression, 10-14 = Moderate depression, 15-19 = Moderately severe depression, 20-27 = Severe depression   Psychosocial Evaluation and  Intervention:   Psychosocial Re-Evaluation:   Psychosocial Discharge (Final Psychosocial Re-Evaluation):   Vocational Rehabilitation: Provide vocational rehab assistance to qualifying candidates.   Vocational Rehab Evaluation & Intervention:   Education: Education Goals: Education classes will be provided on a variety of topics geared toward better understanding of heart health and risk factor modification. Participant will state understanding/return demonstration of topics presented as noted by education test scores.  Learning Barriers/Preferences:   Education Topics: General Nutrition Guidelines/Fats and Fiber: -Group instruction provided by verbal, written material, models and posters to present the general guidelines for heart healthy nutrition. Gives an explanation and review of dietary fats and fiber.   Controlling Sodium/Reading  Food Labels: -Group verbal and written material supporting the discussion of sodium use in heart healthy nutrition. Review and explanation with models, verbal and written materials for utilization of the food label.   Exercise Physiology & Risk Factors: - Group verbal and written instruction with models to review the exercise physiology of the cardiovascular system and associated critical values. Details cardiovascular disease risk factors and the goals associated with each risk factor.   Aerobic Exercise & Resistance Training: - Gives group verbal and written discussion on the health impact of inactivity. On the components of aerobic and resistive training programs and the benefits of this training and how to safely progress through these programs.   Flexibility, Balance, General Exercise Guidelines: - Provides group verbal and written instruction on the benefits of flexibility and balance training programs. Provides general exercise guidelines with specific guidelines to those with heart or lung disease. Demonstration and skill practice  provided.   Stress Management: - Provides group verbal and written instruction about the health risks of elevated stress, cause of high stress, and healthy ways to reduce stress.   Depression: - Provides group verbal and written instruction on the correlation between heart/lung disease and depressed mood, treatment options, and the stigmas associated with seeking treatment.   Anatomy & Physiology of the Heart: - Group verbal and written instruction and models provide basic cardiac anatomy and physiology, with the coronary electrical and arterial systems. Review of: AMI, Angina, Valve disease, Heart Failure, Cardiac Arrhythmia, Pacemakers, and the ICD.   Cardiac Procedures: - Group verbal and written instruction to review commonly prescribed medications for heart disease. Reviews the medication, class of the drug, and side effects. Includes the steps to properly store meds and maintain the prescription regimen. (beta blockers and nitrates)   Cardiac Medications I: - Group verbal and written instruction to review commonly prescribed medications for heart disease. Reviews the medication, class of the drug, and side effects. Includes the steps to properly store meds and maintain the prescription regimen.   Cardiac Medications II: -Group verbal and written instruction to review commonly prescribed medications for heart disease. Reviews the medication, class of the drug, and side effects. (all other drug classes)    Go Sex-Intimacy & Heart Disease, Get SMART - Goal Setting: - Group verbal and written instruction through game format to discuss heart disease and the return to sexual intimacy. Provides group verbal and written material to discuss and apply goal setting through the application of the S.M.A.R.T. Method.   Other Matters of the Heart: - Provides group verbal, written materials and models to describe Heart Failure, Angina, Valve Disease, Peripheral Artery Disease, and Diabetes in  the realm of heart disease. Includes description of the disease process and treatment options available to the cardiac patient.   Exercise & Equipment Safety: - Individual verbal instruction and demonstration of equipment use and safety with use of the equipment.   Infection Prevention: - Provides verbal and written material to individual with discussion of infection control including proper hand washing and proper equipment cleaning during exercise session.   Falls Prevention: - Provides verbal and written material to individual with discussion of falls prevention and safety.   Diabetes: - Individual verbal and written instruction to review signs/symptoms of diabetes, desired ranges of glucose level fasting, after meals and with exercise. Acknowledge that pre and post exercise glucose checks will be done for 3 sessions at entry of program.   Other: -Provides group and verbal instruction on various topics (see comments)  Knowledge Questionnaire Score:   Core Components/Risk Factors/Patient Goals at Admission: Personal Goals and Risk Factors at Admission - 12/14/16 1507      Core Components/Risk Factors/Patient Goals on Admission    Weight Management  Yes    Intervention  Weight Management: Develop a combined nutrition and exercise program designed to reach desired caloric intake, while maintaining appropriate intake of nutrient and fiber, sodium and fats, and appropriate energy expenditure required for the weight goal.;Weight Management/Obesity: Establish reasonable short term and long term weight goals.;Obesity: Provide education and appropriate resources to help participant work on and attain dietary goals.    Admit Weight  198 lb 1.6 oz (89.9 kg)    Goal Weight: Short Term  190 lb (86.2 kg)       Core Components/Risk Factors/Patient Goals Review:    Core Components/Risk Factors/Patient Goals at Discharge (Final Review):    ITP Comments: ITP Comments    Row Name  12/14/16 1321 12/14/16 1324 12/20/16 0545       ITP Comments  ITP created during Medical Review/Orientation appt after Cardiac REhab informed consent was signed.   Stable angina note documented in Dr. Donivan Scull 11/27/2016 note  30 day review. Continue with ITP unless directed changes per Medical Director review.         Comments:

## 2016-12-22 DIAGNOSIS — I208 Other forms of angina pectoris: Secondary | ICD-10-CM

## 2016-12-22 DIAGNOSIS — Z7982 Long term (current) use of aspirin: Secondary | ICD-10-CM | POA: Diagnosis not present

## 2016-12-22 DIAGNOSIS — Z794 Long term (current) use of insulin: Secondary | ICD-10-CM | POA: Diagnosis not present

## 2016-12-22 DIAGNOSIS — E785 Hyperlipidemia, unspecified: Secondary | ICD-10-CM | POA: Diagnosis not present

## 2016-12-22 DIAGNOSIS — E1140 Type 2 diabetes mellitus with diabetic neuropathy, unspecified: Secondary | ICD-10-CM | POA: Diagnosis not present

## 2016-12-22 DIAGNOSIS — Z79899 Other long term (current) drug therapy: Secondary | ICD-10-CM | POA: Diagnosis not present

## 2016-12-22 DIAGNOSIS — I25118 Atherosclerotic heart disease of native coronary artery with other forms of angina pectoris: Secondary | ICD-10-CM | POA: Diagnosis not present

## 2016-12-22 DIAGNOSIS — I10 Essential (primary) hypertension: Secondary | ICD-10-CM | POA: Diagnosis not present

## 2016-12-22 NOTE — Patient Instructions (Signed)
Patient Instructions  Patient Details  Name: Ronald Wallace MRN: [id number] Date of Birth: May 26, 1940 Referring Provider:  [name], MD  Below are the personal goals you chose as well as exercise and nutrition goals. Our goal is to help you keep on track towards obtaining and maintaining your goals. We will be discussing your progress on these goals with you throughout the program.  Initial Exercise Prescription: Initial Exercise Prescription - 12/14/16 1500      Date of Initial Exercise RX and Referring Provider   Date  12/14/16    Referring Provider  [name]      Treadmill   MPH  2.7    Grade  0.5    Minutes  15    METs  3.25      NuStep   Level  3    SPM  80    Minutes  15    METs  3.2      REL-XR   Level  2    Speed  50    Minutes  15    METs  3.25      Prescription Details   Frequency (times per week)  3    Duration  Progress to 45 minutes of aerobic exercise without signs/symptoms of physical distress      Intensity   THRR 40-80% of Max Heartrate  96-129    Ratings of Perceived Exertion  11-13    Perceived Dyspnea  0-4      Resistance Training   Training Prescription  Yes    Weight  3 lb    Reps  10-15       Exercise Goals: Frequency: Be able to perform aerobic exercise three times per week working toward 3-5 days per week.  Intensity: Work with a perceived exertion of 11 (fairly light) - 15 (hard) as tolerated. Follow your new exercise prescription and watch for changes in prescription as you progress with the program. Changes will be reviewed with you when they are made.  Duration: You should be able to do 30 minutes of continuous aerobic exercise in addition to a 5 minute warm-up and a 5 minute cool-down routine.  Nutrition Goals: Your personal nutrition goals will be established when you do your nutrition analysis with the dietician.  The following are nutrition guidelines to follow: Cholesterol < 200mg /day Sodium < 1500mg /day Fiber:  Men over 50 yrs - 30 grams per day  Personal Goals: Personal Goals and Risk Factors at Admission - 12/14/16 1507      Core Components/Risk Factors/Patient Goals on Admission    Weight Management  Yes    Intervention  Weight Management: Develop a combined nutrition and exercise program designed to reach desired caloric intake, while maintaining appropriate intake of nutrient and fiber, sodium and fats, and appropriate energy expenditure required for the weight goal.;Weight Management/Obesity: Establish reasonable short term and long term weight goals.;Obesity: Provide education and appropriate resources to help participant work on and attain dietary goals.    Admit Weight  198 lb 1.6 oz (89.9 kg)    Goal Weight: Short Term  190 lb (86.2 kg)       Tobacco Use Initial Evaluation: Social History   Tobacco Use  Smoking Status Never Smoker  Smokeless Tobacco Never Used    Exercise Goals and Review: Exercise Goals    Row Name 12/14/16 1500             Exercise Goals   Increase Physical Activity  Yes  Intervention  Provide advice, education, support and counseling about physical activity/exercise needs.;Develop an individualized exercise prescription for aerobic and resistive training based on initial evaluation findings, risk stratification, comorbidities and participant's personal goals.       Expected Outcomes  Achievement of increased cardiorespiratory fitness and enhanced flexibility, muscular endurance and strength shown through measurements of functional capacity and personal statement of participant.       Increase Strength and Stamina  Yes       Intervention  Provide advice, education, support and counseling about physical activity/exercise needs.;Develop an individualized exercise prescription for aerobic and resistive training based on initial evaluation findings, risk stratification, comorbidities and participant's personal goals.       Expected Outcomes  Achievement of  increased cardiorespiratory fitness and enhanced flexibility, muscular endurance and strength shown through measurements of functional capacity and personal statement of participant.       Able to understand and use rate of perceived exertion (RPE) scale  Yes       Intervention  Provide education and explanation on how to use RPE scale       Expected Outcomes  Short Term: Able to use RPE daily in rehab to express subjective intensity level;Long Term:  Able to use RPE to guide intensity level when exercising independently       Able to understand and use Dyspnea scale  Yes       Intervention  Provide education and explanation on how to use Dyspnea scale       Expected Outcomes  Short Term: Able to use Dyspnea scale daily in rehab to express subjective sense of shortness of breath during exertion;Long Term: Able to use Dyspnea scale to guide intensity level when exercising independently       Knowledge and understanding of Target Heart Rate Range (THRR)  Yes       Intervention  Provide education and explanation of THRR including how the numbers were predicted and where they are located for reference       Expected Outcomes  Short Term: Able to state/look up THRR;Long Term: Able to use THRR to govern intensity when exercising independently;Short Term: Able to use daily as guideline for intensity in rehab       Able to check pulse independently  Yes       Intervention  Provide education and demonstration on how to check pulse in carotid and radial arteries.;Review the importance of being able to check your own pulse for safety during independent exercise       Expected Outcomes  Short Term: Able to explain why pulse checking is important during independent exercise;Long Term: Able to check pulse independently and accurately       Understanding of Exercise Prescription  Yes       Intervention  Provide education, explanation, and written materials on patient's individual exercise prescription       Expected  Outcomes  Short Term: Able to explain program exercise prescription;Long Term: Able to explain home exercise prescription to exercise independently          Copy of goals given to participant.

## 2016-12-22 NOTE — Progress Notes (Signed)
Daily Session Note  Patient Details  Name: Ronald Wallace MRN: [id number] Date of Birth: 07/31/1940 Referring Provider:     Cardiac Rehab from 12/14/2016 in [facility]  Referring Provider  [name]      Encounter Date: 12/22/2016  Check In: Session Check In - 12/22/16 0927      Check-In   Location  [facility]    Staff Present  Amanda Sommer, BA, ACSM CEP, Exercise Physiologist;Krista Spencer, RN BSN;Jessica Hawkins, MA, ACSM RCEP, Exercise Physiologist    Supervising physician immediately available to respond to emergencies  See telemetry face sheet for immediately available ER MD    Medication changes reported      No    Fall or balance concerns reported     No    Warm-up and Cool-down  Performed on first and last piece of equipment    Resistance Training Performed  Yes    VAD Patient?  No      Pain Assessment   Currently in Pain?  No/denies          Social History   Tobacco Use  Smoking Status Never Smoker  Smokeless Tobacco Never Used    Goals Met:  Independence with exercise equipment Exercise tolerated well No report of cardiac concerns or symptoms Strength training completed today  Goals Unmet:  Not Applicable  Comments:First full day of exercise!  Patient was oriented to gym and equipment including functions, settings, policies, and procedures.  Patient's individual exercise prescription and treatment plan were reviewed.  All starting workloads were established based on the results of the 6 minute walk test done at initial orientation visit.  The plan for exercise progression was also introduced and progression will be customized based on patient's performance and goals.      Dr. Mark Miller is Medical Director for [facility] and [facility]. 

## 2016-12-29 ENCOUNTER — Telehealth: Payer: Self-pay | Admitting: *Deleted

## 2016-12-29 NOTE — Telephone Encounter (Signed)
Ronald Wallace called to let us know that he would miss rehab today and Monday.  Today he had to take his wife to a doctor's appointment and Monday he has a dermatologist appointment.  He hopes to return on Wednesday of next week.

## 2017-01-01 DIAGNOSIS — D044 Carcinoma in situ of skin of scalp and neck: Secondary | ICD-10-CM | POA: Diagnosis not present

## 2017-01-01 DIAGNOSIS — L905 Scar conditions and fibrosis of skin: Secondary | ICD-10-CM | POA: Diagnosis not present

## 2017-01-10 ENCOUNTER — Encounter: Payer: Self-pay | Admitting: *Deleted

## 2017-01-15 DIAGNOSIS — C44519 Basal cell carcinoma of skin of other part of trunk: Secondary | ICD-10-CM | POA: Diagnosis not present

## 2017-01-17 ENCOUNTER — Encounter: Payer: PPO | Attending: Cardiovascular Disease

## 2017-01-17 ENCOUNTER — Encounter: Payer: Self-pay | Admitting: *Deleted

## 2017-01-17 DIAGNOSIS — Z79899 Other long term (current) drug therapy: Secondary | ICD-10-CM | POA: Insufficient documentation

## 2017-01-17 DIAGNOSIS — Z794 Long term (current) use of insulin: Secondary | ICD-10-CM | POA: Insufficient documentation

## 2017-01-17 DIAGNOSIS — E785 Hyperlipidemia, unspecified: Secondary | ICD-10-CM | POA: Insufficient documentation

## 2017-01-17 DIAGNOSIS — Z7982 Long term (current) use of aspirin: Secondary | ICD-10-CM | POA: Insufficient documentation

## 2017-01-17 DIAGNOSIS — E1140 Type 2 diabetes mellitus with diabetic neuropathy, unspecified: Secondary | ICD-10-CM | POA: Insufficient documentation

## 2017-01-17 DIAGNOSIS — I25118 Atherosclerotic heart disease of native coronary artery with other forms of angina pectoris: Secondary | ICD-10-CM | POA: Insufficient documentation

## 2017-01-17 DIAGNOSIS — I10 Essential (primary) hypertension: Secondary | ICD-10-CM | POA: Insufficient documentation

## 2017-01-17 DIAGNOSIS — I208 Other forms of angina pectoris: Secondary | ICD-10-CM

## 2017-01-17 NOTE — Progress Notes (Signed)
Cardiac Individual Treatment Plan  Patient Details  Name: Ronald Wallace MRN: [id number] Date of Birth: 1940-07-23 Referring Provider:     Cardiac Rehab from 12/14/2016 in [facility]  Referring Provider  [name]      Initial Encounter Date:    Cardiac Rehab from 12/14/2016 in [facility]  Date  12/14/16  Referring Provider  Rockey Situ      Visit Diagnosis: Stable angina ([place])  Patient's Home Medications on Admission:  Current Outpatient Medications:  .  aspirin 81 MG tablet, Take 81 mg by mouth daily., Disp: , Rfl:  .  calcium carbonate (OS-CAL) 600 MG TABS tablet, Take 600 mg by mouth daily with breakfast., Disp: , Rfl:  .  cholecalciferol (VITAMIN D) 1000 UNITS tablet, Take 1,000 Units by mouth daily., Disp: , Rfl:  .  Ferrous Sulfate (IRON) 325 (65 FE) MG TABS, Take 1 tablet by mouth daily. , Disp: , Rfl:  .  furosemide (LASIX) 20 MG tablet, Take 1 tablet (20 mg total) by mouth as needed for fluid (for shortness of breath)., Disp: 30 tablet, Rfl: 6 .  gabapentin (NEURONTIN) 300 MG capsule, Take 600 mg by mouth at bedtime., Disp: , Rfl:  .  hydrochlorothiazide (MICROZIDE) 12.5 MG capsule, Take 12.5 mg by mouth daily. , Disp: , Rfl:  .  isosorbide mononitrate (IMDUR) 30 MG 24 hr tablet, Take 1 tablet (30 mg total) by mouth daily., Disp: 90 tablet, Rfl: 1 .  LANTUS 100 UNIT/ML injection, Inject 42 Units into the skin daily. Take at bedtime, Disp: , Rfl:  .  lisinopril (PRINIVIL,ZESTRIL) 20 MG tablet, Take 20 mg by mouth daily., Disp: , Rfl:  .  metFORMIN (GLUCOPHAGE) 500 MG tablet, Take 250 mg by mouth 2 (two) times daily with a meal., Disp: , Rfl:  .  NOVOLOG 100 UNIT/ML injection, Inject into the skin 3 (three) times daily with meals. 12 units am, 12 units lunch, 14 units supper, Disp: , Rfl:  .  simvastatin (ZOCOR) 20 MG tablet, Take 20 mg by mouth daily., Disp: , Rfl:  .  tamsulosin (FLOMAX) 0.4 MG CAPS capsule, Take 0.4 mg by mouth  daily., Disp: , Rfl:   Past Medical History: Past Medical History:  Diagnosis Date  . Anemia   . CAD (coronary artery disease)    a. 02/2014 Neg Ex MV, EF 65%;  b. 06/2015 Abnl MV ([facility])-->Cath ([facility]): LM nl, LAD 30p/m, LCX 30p, OM2 50p, RCA 100p w/ L-->R collats, nl EF-->Med Rx.  . Diabetes mellitus, type II, insulin dependent ([place])    With neuropathy  . Diverticulitis   . Essential hypertension   . Hyperlipidemia   . Neuropathy   . Shortness of breath dyspnea   . Syncope and collapse    a. 02/2014 Carotid Dopplers: 1-39% bilateral ICA with normal Subclavian & vertebral arteries; b. 02/2014 2 D Echo: EF 50-55%, Gr 1 DD, Mild MR & LA dilation -- was bradycardic in 40s.    Tobacco Use: Social History   Tobacco Use  Smoking Status Never Smoker  Smokeless Tobacco Never Used    Labs: Recent Review Flowsheet Data    There is no flowsheet data to display.       Exercise Target Goals:    Exercise Program Goal: Individual exercise prescription set with THRR, safety & activity barriers. Participant demonstrates ability to understand and report RPE using BORG scale, to self-measure pulse accurately, and to acknowledge the importance of the exercise prescription.  Exercise Prescription Goal: Starting with aerobic activity 30 plus minutes a day, 3 days per week for initial exercise prescription. Provide home exercise prescription and guidelines that participant acknowledges understanding prior to discharge.  Activity Barriers & Risk Stratification: Activity Barriers & Cardiac Risk Stratification - 12/14/16 1319      Activity Barriers & Cardiac Risk Stratification   Activity Barriers  Arthritis;Chest Pain/Angina;Neck/Spine Problems    Cardiac Risk Stratification  High       6 Minute Walk: 6 Minute Walk    Row Name 12/14/16 1501         6 Minute Walk   Distance  1488 feet     Walk Time  6 minutes     # of Rest Breaks  0     MPH  2.82     METS  3.25     RPE  9       Perceived Dyspnea   3     VO2 Peak  11.36     Symptoms  No     Resting HR  63 bpm     Resting BP  132/64     Resting Oxygen Saturation   96 %     Exercise Oxygen Saturation  during 6 min walk  95 %     Max Ex. HR  129 bpm     Max Ex. BP  162/64     2 Minute Post BP  130/60        Oxygen Initial Assessment:   Oxygen Re-Evaluation:   Oxygen Discharge (Final Oxygen Re-Evaluation):   Initial Exercise Prescription: Initial Exercise Prescription - 12/14/16 1500      Date of Initial Exercise RX and Referring Provider   Date  12/14/16    Referring Provider  [name]      Treadmill   MPH  2.7    Grade  0.5    Minutes  15    METs  3.25      NuStep   Level  3    SPM  80    Minutes  15    METs  3.2      REL-XR   Level  2    Speed  50    Minutes  15    METs  3.25      Prescription Details   Frequency (times per week)  3    Duration  Progress to 45 minutes of aerobic exercise without signs/symptoms of physical distress      Intensity   THRR 40-80% of Max Heartrate  96-129    Ratings of Perceived Exertion  11-13    Perceived Dyspnea  0-4      Resistance Training   Training Prescription  Yes    Weight  3 lb    Reps  10-15       Perform Capillary Blood Glucose checks as needed.  Exercise Prescription Changes: Exercise Prescription Changes    Row Name 12/26/16 1300             Response to Exercise   Blood Pressure (Admit)  134/70       Blood Pressure (Exercise)  134/60       Blood Pressure (Exit)  126/54       Heart Rate (Admit)  73 bpm       Heart Rate (Exercise)  105 bpm       Heart Rate (Exit)  68 bpm       Rating of Perceived Exertion (Exercise)  13       Symptoms  none       Comments  first full day of exercise       Duration  Progress to 45 minutes of aerobic exercise without signs/symptoms of physical distress       Intensity  THRR unchanged         Progression   Progression  Continue to progress workloads to maintain intensity without  signs/symptoms of physical distress.       Average METs  3.22         Resistance Training   Training Prescription  Yes       Weight  3 lb       Reps  10-15         Interval Training   Interval Training  No         Treadmill   MPH  2.7       Grade  0.5       Minutes  15       METs  3.25         NuStep   Level  3       Minutes  15       METs  3         REL-XR   Level  2       Minutes  15       METs  3.4          Exercise Comments: Exercise Comments    Row Name 12/22/16 [phone]           Exercise Comments  First full day of exercise!  Patient was oriented to gym and equipment including functions, settings, policies, and procedures.  Patient's individual exercise prescription and treatment plan were reviewed.  All starting workloads were established based on the results of the 6 minute walk test done at initial orientation visit.  The plan for exercise progression was also introduced and progression will be customized based on patient's performance and goals.          Exercise Goals and Review: Exercise Goals    Row Name 12/14/16 1500             Exercise Goals   Increase Physical Activity  Yes       Intervention  Provide advice, education, support and counseling about physical activity/exercise needs.;Develop an individualized exercise prescription for aerobic and resistive training based on initial evaluation findings, risk stratification, comorbidities and participant's personal goals.       Expected Outcomes  Achievement of increased cardiorespiratory fitness and enhanced flexibility, muscular endurance and strength shown through measurements of functional capacity and personal statement of participant.       Increase Strength and Stamina  Yes       Intervention  Provide advice, education, support and counseling about physical activity/exercise needs.;Develop an individualized exercise prescription for aerobic and resistive training based on initial evaluation  findings, risk stratification, comorbidities and participant's personal goals.       Expected Outcomes  Achievement of increased cardiorespiratory fitness and enhanced flexibility, muscular endurance and strength shown through measurements of functional capacity and personal statement of participant.       Able to understand and use rate of perceived exertion (RPE) scale  Yes       Intervention  Provide education and explanation on how to use RPE scale       Expected Outcomes  Short Term: Able to use RPE daily in  rehab to express subjective intensity level;Long Term:  Able to use RPE to guide intensity level when exercising independently       Able to understand and use Dyspnea scale  Yes       Intervention  Provide education and explanation on how to use Dyspnea scale       Expected Outcomes  Short Term: Able to use Dyspnea scale daily in rehab to express subjective sense of shortness of breath during exertion;Long Term: Able to use Dyspnea scale to guide intensity level when exercising independently       Knowledge and understanding of Target Heart Rate Range (THRR)  Yes       Intervention  Provide education and explanation of THRR including how the numbers were predicted and where they are located for reference       Expected Outcomes  Short Term: Able to state/look up THRR;Long Term: Able to use THRR to govern intensity when exercising independently;Short Term: Able to use daily as guideline for intensity in rehab       Able to check pulse independently  Yes       Intervention  Provide education and demonstration on how to check pulse in carotid and radial arteries.;Review the importance of being able to check your own pulse for safety during independent exercise       Expected Outcomes  Short Term: Able to explain why pulse checking is important during independent exercise;Long Term: Able to check pulse independently and accurately       Understanding of Exercise Prescription  Yes        Intervention  Provide education, explanation, and written materials on patient's individual exercise prescription       Expected Outcomes  Short Term: Able to explain program exercise prescription;Long Term: Able to explain home exercise prescription to exercise independently          Exercise Goals Re-Evaluation : Exercise Goals Re-Evaluation    Row Name 12/22/16 7253 01/10/17 1511           Exercise Goal Re-Evaluation   Exercise Goals Review  Understanding of Exercise Prescription;Knowledge and understanding of Target Heart Rate Range (THRR);Able to understand and use rate of perceived exertion (RPE) scale  -      Comments  Reviewed RPE scale, THR and program prescription with pt today.  Pt voiced understanding and was given a copy of goals to take home.   After some dermatology surgery, [name]'s doctor requested that he stay out until after the new year.       Expected Outcomes  Short: Use RPE daily to regulate intensity.  Long: Follow program prescription in [facility].  -         Discharge Exercise Prescription (Final Exercise Prescription Changes): Exercise Prescription Changes - 12/26/16 1300      Response to Exercise   Blood Pressure (Admit)  134/70    Blood Pressure (Exercise)  134/60    Blood Pressure (Exit)  126/54    Heart Rate (Admit)  73 bpm    Heart Rate (Exercise)  105 bpm    Heart Rate (Exit)  68 bpm    Rating of Perceived Exertion (Exercise)  13    Symptoms  none    Comments  first full day of exercise    Duration  Progress to 45 minutes of aerobic exercise without signs/symptoms of physical distress    Intensity  THRR unchanged      Progression   Progression  Continue to progress workloads  to maintain intensity without signs/symptoms of physical distress.    Average METs  3.22      Resistance Training   Training Prescription  Yes    Weight  3 lb    Reps  10-15      Interval Training   Interval Training  No      Treadmill   MPH  2.7    Grade  0.5     Minutes  15    METs  3.25      NuStep   Level  3    Minutes  15    METs  3      REL-XR   Level  2    Minutes  15    METs  3.4       Nutrition:  Target Goals: Understanding of nutrition guidelines, daily intake of sodium 1500mg , cholesterol 200mg , calories 30% from fat and 7% or less from saturated fats, daily to have 5 or more servings of fruits and vegetables.  Biometrics: Pre Biometrics - 12/14/16 1459      Pre Biometrics   Height  5' 8.5" (1.74 m)    Weight  198 lb 1.6 oz (89.9 kg)    Waist Circumference  41.5 inches    Hip Circumference  40 inches    Waist to Hip Ratio  1.04 %    BMI (Calculated)  29.68    Single Leg Stand  20.27 seconds        Nutrition Therapy Plan and Nutrition Goals: Nutrition Therapy & Goals - 12/14/16 1320      Nutrition Therapy   RD appointment defered  Yes    Drug/Food Interactions  Statins/Certain Fruits       Nutrition Discharge: Rate Your Plate Scores: Nutrition Assessments - 12/14/16 1320      MEDFICTS Scores   Pre Score  20       Nutrition Goals Re-Evaluation:   Nutrition Goals Discharge (Final Nutrition Goals Re-Evaluation):   Psychosocial: Target Goals: Acknowledge presence or absence of significant depression and/or stress, maximize coping skills, provide positive support system. Participant is able to verbalize types and ability to use techniques and skills needed for reducing stress and depression.   Initial Review & Psychosocial Screening: Initial Psych Review & Screening - 12/14/16 1313      Initial Review   Current issues with  Current Sleep Concerns;Current Stress Concerns    Source of Stress Concerns  Chronic Illness      Family Dynamics   Good Support System?  Yes      Barriers   Psychosocial barriers to participate in program  The patient should benefit from training in stress management and relaxation.      Screening Interventions   Interventions  Encouraged to exercise;Yes;Program counselor  consult    Expected Outcomes  Short Term goal: Utilizing psychosocial counselor, staff and physician to assist with identification of specific Stressors or current issues interfering with healing process. Setting desired goal for each stressor or current issue identified.;Long Term Goal: Stressors or current issues are controlled or eliminated.;Short Term goal: Identification and review with participant of any Quality of Life or Depression concerns found by scoring the questionnaire.;Long Term goal: The participant improves quality of Life and PHQ9 Scores as seen by post scores and/or verbalization of changes       Quality of Life Scores:  Quality of Life - 12/14/16 1319      Quality of Life Scores   GLOBAL Pre  17.18 %  PHQ-9: Recent Review Flowsheet Data    Depression screen [facility] 2/9 12/14/2016 12/14/2016   Decreased Interest - 0   Down, Depressed, Hopeless - 2   PHQ - 2 Score - 2   Altered sleeping - 3   Tired, decreased energy - 2   Change in appetite - 0   Feeling bad or failure about yourself  - 1   Trouble concentrating - 3   Moving slowly or fidgety/restless - 0   Suicidal thoughts 0  1   PHQ-9 Score - 12     Interpretation of Total Score  Total Score Depression Severity:  1-4 = Minimal depression, 5-9 = Mild depression, 10-14 = Moderate depression, 15-19 = Moderately severe depression, 20-27 = Severe depression   Psychosocial Evaluation and Intervention:   Psychosocial Re-Evaluation:   Psychosocial Discharge (Final Psychosocial Re-Evaluation):   Vocational Rehabilitation: Provide vocational rehab assistance to qualifying candidates.   Vocational Rehab Evaluation & Intervention:   Education: Education Goals: Education classes will be provided on a variety of topics geared toward better understanding of heart health and risk factor modification. Participant will state understanding/return demonstration of topics presented as noted by education test  scores.  Learning Barriers/Preferences:   Education Topics: General Nutrition Guidelines/Fats and Fiber: -Group instruction provided by verbal, written material, models and posters to present the general guidelines for heart healthy nutrition. Gives an explanation and review of dietary fats and fiber.   Controlling Sodium/Reading Food Labels: -Group verbal and written material supporting the discussion of sodium use in heart healthy nutrition. Review and explanation with models, verbal and written materials for utilization of the food label.   Exercise Physiology & Risk Factors: - Group verbal and written instruction with models to review the exercise physiology of the cardiovascular system and associated critical values. Details cardiovascular disease risk factors and the goals associated with each risk factor.   Aerobic Exercise & Resistance Training: - Gives group verbal and written discussion on the health impact of inactivity. On the components of aerobic and resistive training programs and the benefits of this training and how to safely progress through these programs.   Flexibility, Balance, General Exercise Guidelines: - Provides group verbal and written instruction on the benefits of flexibility and balance training programs. Provides general exercise guidelines with specific guidelines to those with heart or lung disease. Demonstration and skill practice provided.   Stress Management: - Provides group verbal and written instruction about the health risks of elevated stress, cause of high stress, and healthy ways to reduce stress.   Depression: - Provides group verbal and written instruction on the correlation between heart/lung disease and depressed mood, treatment options, and the stigmas associated with seeking treatment.   Anatomy & Physiology of the Heart: - Group verbal and written instruction and models provide basic cardiac anatomy and physiology, with the coronary  electrical and arterial systems. Review of: AMI, Angina, Valve disease, Heart Failure, Cardiac Arrhythmia, Pacemakers, and the ICD.   Cardiac Procedures: - Group verbal and written instruction to review commonly prescribed medications for heart disease. Reviews the medication, class of the drug, and side effects. Includes the steps to properly store meds and maintain the prescription regimen. (beta blockers and nitrates)   Cardiac Medications I: - Group verbal and written instruction to review commonly prescribed medications for heart disease. Reviews the medication, class of the drug, and side effects. Includes the steps to properly store meds and maintain the prescription regimen.   Cardiac Medications II: -Group verbal and  written instruction to review commonly prescribed medications for heart disease. Reviews the medication, class of the drug, and side effects. (all other drug classes)    Go Sex-Intimacy & Heart Disease, Get SMART - Goal Setting: - Group verbal and written instruction through game format to discuss heart disease and the return to sexual intimacy. Provides group verbal and written material to discuss and apply goal setting through the application of the S.M.A.R.T. Method.   Other Matters of the Heart: - Provides group verbal, written materials and models to describe Heart Failure, Angina, Valve Disease, Peripheral Artery Disease, and Diabetes in the realm of heart disease. Includes description of the disease process and treatment options available to the cardiac patient.   Exercise & Equipment Safety: - Individual verbal instruction and demonstration of equipment use and safety with use of the equipment.   Infection Prevention: - Provides verbal and written material to individual with discussion of infection control including proper hand washing and proper equipment cleaning during exercise session.   Falls Prevention: - Provides verbal and written material to  individual with discussion of falls prevention and safety.   Diabetes: - Individual verbal and written instruction to review signs/symptoms of diabetes, desired ranges of glucose level fasting, after meals and with exercise. Acknowledge that pre and post exercise glucose checks will be done for 3 sessions at entry of program.   Other: -Provides group and verbal instruction on various topics (see comments)    Knowledge Questionnaire Score:   Core Components/Risk Factors/Patient Goals at Admission: Personal Goals and Risk Factors at Admission - 12/14/16 1507      Core Components/Risk Factors/Patient Goals on Admission    Weight Management  Yes    Intervention  Weight Management: Develop a combined nutrition and exercise program designed to reach desired caloric intake, while maintaining appropriate intake of nutrient and fiber, sodium and fats, and appropriate energy expenditure required for the weight goal.;Weight Management/Obesity: Establish reasonable short term and long term weight goals.;Obesity: Provide education and appropriate resources to help participant work on and attain dietary goals.    Admit Weight  198 lb 1.6 oz (89.9 kg)    Goal Weight: Short Term  190 lb (86.2 kg)       Core Components/Risk Factors/Patient Goals Review:    Core Components/Risk Factors/Patient Goals at Discharge (Final Review):    ITP Comments: ITP Comments    Row Name 12/14/16 1321 12/14/16 1324 12/20/16 0545 01/10/17 1510 01/17/17 0630   ITP Comments  ITP created during Medical Review/Orientation appt after Cardiac REhab informed consent was signed.   Stable angina note documented in Dr. Donivan Scull 11/27/2016 note  30 day review. Continue with ITP unless directed changes per Medical Director review.   After some dermatology surgery, [name]'s doctor requested that he stay out until after the new year.   30 day review. Continue with ITP unless directed changes per Medical Director review.   1 visit  since last review      Comments:

## 2017-01-22 ENCOUNTER — Encounter: Payer: PPO | Admitting: *Deleted

## 2017-01-22 DIAGNOSIS — Z7982 Long term (current) use of aspirin: Secondary | ICD-10-CM | POA: Diagnosis not present

## 2017-01-22 DIAGNOSIS — I25118 Atherosclerotic heart disease of native coronary artery with other forms of angina pectoris: Secondary | ICD-10-CM | POA: Diagnosis not present

## 2017-01-22 DIAGNOSIS — I208 Other forms of angina pectoris: Secondary | ICD-10-CM | POA: Diagnosis present

## 2017-01-22 DIAGNOSIS — Z79899 Other long term (current) drug therapy: Secondary | ICD-10-CM | POA: Diagnosis not present

## 2017-01-22 DIAGNOSIS — E785 Hyperlipidemia, unspecified: Secondary | ICD-10-CM | POA: Diagnosis not present

## 2017-01-22 DIAGNOSIS — Z794 Long term (current) use of insulin: Secondary | ICD-10-CM | POA: Diagnosis not present

## 2017-01-22 DIAGNOSIS — I10 Essential (primary) hypertension: Secondary | ICD-10-CM | POA: Diagnosis not present

## 2017-01-22 DIAGNOSIS — E1140 Type 2 diabetes mellitus with diabetic neuropathy, unspecified: Secondary | ICD-10-CM | POA: Diagnosis not present

## 2017-01-22 LAB — GLUCOSE, CAPILLARY
Glucose-Capillary: 127 mg/dL — ABNORMAL HIGH (ref 65–99)
Glucose-Capillary: 97 mg/dL (ref 65–99)

## 2017-01-22 NOTE — Progress Notes (Signed)
Daily Session Note  Patient Details  Name: Ronald Wallace MRN: [id number] Date of Birth: [birth year] Referring Provider:     Cardiac Rehab from 12/14/2016 in [facility] Cardiac and Pulmonary Rehab  Referring Provider  [name]      Encounter Date: 01/22/2017  Check In: Session Check In - 01/22/17 0807      Check-In   Location  [facility]-Cardiac & Pulmonary Rehab    Staff Present  Earlean Shawl, BS, ACSM CEP, Exercise Physiologist;Krista Frederico Hamman, RN BSN;Jessica Luan Pulling, MA, ACSM RCEP, Exercise Physiologist    Supervising physician immediately available to respond to emergencies  See telemetry face sheet for immediately available ER MD    Medication changes reported      No    Fall or balance concerns reported     No    Warm-up and Cool-down  Performed on first and last piece of equipment    Resistance Training Performed  Yes    VAD Patient?  No      Pain Assessment   Currently in Pain?  No/denies          Social History   Tobacco Use  Smoking Status Never Smoker  Smokeless Tobacco Never Used    Goals Met:  Independence with exercise equipment Exercise tolerated well No report of cardiac concerns or symptoms Strength training completed today  Goals Unmet:  Not Applicable  Comments: Pt able to follow exercise prescription today without complaint.  Will continue to monitor for progression.    Dr. Emily Filbert is Medical Director for [place] and [facility].

## 2017-01-24 DIAGNOSIS — I25118 Atherosclerotic heart disease of native coronary artery with other forms of angina pectoris: Secondary | ICD-10-CM | POA: Diagnosis not present

## 2017-01-24 DIAGNOSIS — I208 Other forms of angina pectoris: Secondary | ICD-10-CM

## 2017-01-24 LAB — GLUCOSE, CAPILLARY
Glucose-Capillary: 164 mg/dL — ABNORMAL HIGH (ref 65–99)
Glucose-Capillary: 160 mg/dL — ABNORMAL HIGH (ref 65–99)

## 2017-01-24 NOTE — Progress Notes (Signed)
Daily Session Note  Patient Details  Name: DAGEN BEEVERS MRN: [id number] Date of Birth: August 01, 1940 Referring Provider:     Cardiac Rehab from 12/14/2016 in [facility] Cardiac and Pulmonary Rehab  Referring Provider  [name]      Encounter Date: 01/24/2017  Check In: Session Check In - 01/24/17 0755      Check-In   Location  [facility]-Cardiac & Pulmonary Rehab    Staff Present  Justin Mend RCP,RRT,BSRT;Heath Lark, RN, BSN, CCRP;Jessica Luan Pulling, MA, ACSM RCEP, Exercise Physiologist    Supervising physician immediately available to respond to emergencies  See telemetry face sheet for immediately available ER MD    Medication changes reported      No    Fall or balance concerns reported     No    Warm-up and Cool-down  Performed on first and last piece of equipment    Resistance Training Performed  Yes    VAD Patient?  No      Pain Assessment   Currently in Pain?  No/denies          Social History   Tobacco Use  Smoking Status Never Smoker  Smokeless Tobacco Never Used    Goals Met:  Independence with exercise equipment Exercise tolerated well No report of cardiac concerns or symptoms Strength training completed today  Goals Unmet:  Not Applicable  Comments: Pt able to follow exercise prescription today without complaint.  Will continue to monitor for progression.   Dr. Emily Filbert is Medical Director for [place] and [facility].

## 2017-01-26 DIAGNOSIS — I208 Other forms of angina pectoris: Secondary | ICD-10-CM

## 2017-01-26 DIAGNOSIS — I25118 Atherosclerotic heart disease of native coronary artery with other forms of angina pectoris: Secondary | ICD-10-CM | POA: Diagnosis not present

## 2017-01-26 LAB — GLUCOSE, CAPILLARY: Glucose-Capillary: 145 mg/dL — ABNORMAL HIGH (ref 65–99)

## 2017-01-26 NOTE — Progress Notes (Signed)
Daily Session Note  Patient Details  Name: Ronald Wallace MRN: [id number] Date of Birth: 1940/08/31 Referring Provider:     Cardiac Rehab from 12/14/2016 in [facility] Cardiac and Pulmonary Rehab  Referring Provider  [name]      Encounter Date: 01/26/2017  Check In: Session Check In - 01/26/17 0852      Check-In   Location  [facility]-Cardiac & Pulmonary Rehab    Staff Present  Nada Maclachlan, BA, ACSM CEP, Exercise Physiologist;Meredith Sherryll Burger, RN Levie Heritage, MA, ACSM RCEP, Exercise Physiologist    Supervising physician immediately available to respond to emergencies  See telemetry face sheet for immediately available ER MD    Medication changes reported      No    Fall or balance concerns reported     No    Warm-up and Cool-down  Performed on first and last piece of equipment    Resistance Training Performed  Yes    VAD Patient?  No      Pain Assessment   Currently in Pain?  No/denies    Multiple Pain Sites  No          Social History   Tobacco Use  Smoking Status Never Smoker  Smokeless Tobacco Never Used    Goals Met:  Independence with exercise equipment Exercise tolerated well No report of cardiac concerns or symptoms Strength training completed today  Goals Unmet:  Not Applicable  Comments: Pt able to follow exercise prescription today without complaint.  Will continue to monitor for progression.    Dr. Emily Filbert is Medical Director for [place] and [facility].

## 2017-01-29 ENCOUNTER — Encounter: Payer: PPO | Admitting: *Deleted

## 2017-01-29 DIAGNOSIS — I25118 Atherosclerotic heart disease of native coronary artery with other forms of angina pectoris: Secondary | ICD-10-CM | POA: Diagnosis not present

## 2017-01-29 DIAGNOSIS — I2089 Other forms of angina pectoris: Secondary | ICD-10-CM

## 2017-01-29 DIAGNOSIS — I208 Other forms of angina pectoris: Secondary | ICD-10-CM

## 2017-01-29 LAB — GLUCOSE, CAPILLARY
Glucose-Capillary: 179 mg/dL — ABNORMAL HIGH (ref 65–99)
Glucose-Capillary: 131 mg/dL — ABNORMAL HIGH (ref 65–99)

## 2017-01-29 NOTE — Progress Notes (Signed)
Daily Session Note  Patient Details  Name: Ronald Wallace MRN: [id number] Date of Birth: 1940-06-15 Referring Provider:     Cardiac Rehab from 12/14/2016 in [facility] Cardiac and Pulmonary Rehab  Referring Provider  [name]      Encounter Date: 01/29/2017  Check In: Session Check In - 01/29/17 0758      Check-In   Location  [facility]-Cardiac & Pulmonary Rehab    Staff Present  Joellyn Rued, BS, [place], [place], ACSM CEP, Exercise Physiologist;Krista Frederico Hamman, RN BSN    Supervising physician immediately available to respond to emergencies  See telemetry face sheet for immediately available ER MD    Medication changes reported      No    Fall or balance concerns reported     No    Tobacco Cessation  No Change    Warm-up and Cool-down  Performed on first and last piece of equipment    Resistance Training Performed  Yes    VAD Patient?  No      Pain Assessment   Currently in Pain?  No/denies    Multiple Pain Sites  No          Social History   Tobacco Use  Smoking Status Never Smoker  Smokeless Tobacco Never Used    Goals Met:  Independence with exercise equipment Exercise tolerated well No report of cardiac concerns or symptoms Strength training completed today  Goals Unmet:  Not Applicable  Comments: Pt able to follow exercise prescription today without complaint.  Will continue to monitor for progression.    Dr. Emily Filbert is Medical Director for [place] and [facility].

## 2017-01-31 DIAGNOSIS — I208 Other forms of angina pectoris: Secondary | ICD-10-CM

## 2017-01-31 DIAGNOSIS — I2089 Other forms of angina pectoris: Secondary | ICD-10-CM

## 2017-01-31 DIAGNOSIS — I25118 Atherosclerotic heart disease of native coronary artery with other forms of angina pectoris: Secondary | ICD-10-CM | POA: Diagnosis not present

## 2017-01-31 NOTE — Progress Notes (Signed)
Daily Session Note  Patient Details  Name: Ronald Wallace MRN: [id number] Date of Birth: 1940/04/08 Referring Provider:     Cardiac Rehab from 12/14/2016 in [facility] Cardiac and Pulmonary Rehab  Referring Provider  Ronald Wallace      Encounter Date: 01/31/2017  Check In: Session Check In - 01/31/17 0725      Check-In   Location  [facility]-Cardiac & Pulmonary Rehab    Staff Present  Ronald Wallace RCP,RRT,BSRT;Ronald Frederico Hamman, RN BSN;Ronald Luan Pulling, MA, RCEP, CCRP, Exercise Physiologist    Supervising physician immediately available to respond to emergencies  See telemetry face sheet for immediately available ER MD    Medication changes reported      No    Fall or balance concerns reported     No    Warm-up and Cool-down  Performed on first and last piece of equipment    Resistance Training Performed  Yes    VAD Patient?  No      Pain Assessment   Currently in Pain?  No/denies          Social History   Tobacco Use  Smoking Status Never Smoker  Smokeless Tobacco Never Used    Goals Met:  Independence with exercise equipment Exercise tolerated well Personal goals reviewed No report of cardiac concerns or symptoms Strength training completed today  Goals Unmet:  Not Applicable  Comments: Pt able to follow exercise prescription today without complaint.  Will continue to monitor for progression. Reviewed home exercise with pt today.  Pt plans to walking, treadmill, and returning to [facility]  for exercise.  Reviewed THR, pulse, RPE, sign and symptoms, and when to call 911 or MD.  Also discussed weather considerations and indoor options.  Pt voiced understanding.  Dr. Emily Wallace is Medical Director for [place] and [facility].

## 2017-02-02 DIAGNOSIS — I208 Other forms of angina pectoris: Secondary | ICD-10-CM

## 2017-02-02 DIAGNOSIS — I25118 Atherosclerotic heart disease of native coronary artery with other forms of angina pectoris: Secondary | ICD-10-CM | POA: Diagnosis not present

## 2017-02-02 NOTE — Progress Notes (Signed)
Daily Session Note  Patient Details  Name: Ronald Wallace MRN: [id number] Date of Birth: [birth year] Referring Provider:     Cardiac Rehab from 12/14/2016 in [facility] Cardiac and Pulmonary Rehab  Referring Provider  [name]      Encounter Date: 02/02/2017  Check In: Session Check In - 02/02/17 0836      Check-In   Location  [facility]-Cardiac & Pulmonary Rehab    Staff Present  Alberteen Sam, MA, RCEP, CCRP, Exercise Physiologist;Jevon [place]page Oletta Darter, BA, ACSM CEP, Exercise Physiologist;Meredith Sherryll Burger, RN BSN    Supervising physician immediately available to respond to emergencies  See telemetry face sheet for immediately available ER MD    Medication changes reported      No    Fall or balance concerns reported     No    Warm-up and Cool-down  Performed on first and last piece of equipment    Resistance Training Performed  Yes    VAD Patient?  No      Pain Assessment   Currently in Pain?  No/denies    Multiple Pain Sites  No          Social History   Tobacco Use  Smoking Status Never Smoker  Smokeless Tobacco Never Used    Goals Met:  Independence with exercise equipment Exercise tolerated well No report of cardiac concerns or symptoms Strength training completed today  Goals Unmet:  Not Applicable  Comments: Pt able to follow exercise prescription today without complaint.  Will continue to monitor for progression.    Dr. Emily Filbert is Medical Director for [place] and [facility].

## 2017-02-05 DIAGNOSIS — B029 Zoster without complications: Secondary | ICD-10-CM | POA: Diagnosis not present

## 2017-02-05 DIAGNOSIS — H6982 Other specified disorders of Eustachian tube, left ear: Secondary | ICD-10-CM | POA: Diagnosis not present

## 2017-02-06 DIAGNOSIS — E1169 Type 2 diabetes mellitus with other specified complication: Secondary | ICD-10-CM | POA: Diagnosis not present

## 2017-02-06 DIAGNOSIS — I10 Essential (primary) hypertension: Secondary | ICD-10-CM | POA: Diagnosis not present

## 2017-02-06 DIAGNOSIS — E1142 Type 2 diabetes mellitus with diabetic polyneuropathy: Secondary | ICD-10-CM | POA: Diagnosis not present

## 2017-02-06 DIAGNOSIS — E1159 Type 2 diabetes mellitus with other circulatory complications: Secondary | ICD-10-CM | POA: Diagnosis not present

## 2017-02-06 DIAGNOSIS — Z794 Long term (current) use of insulin: Secondary | ICD-10-CM | POA: Diagnosis not present

## 2017-02-06 DIAGNOSIS — E785 Hyperlipidemia, unspecified: Secondary | ICD-10-CM | POA: Diagnosis not present

## 2017-02-06 HISTORY — DX: Type 2 diabetes mellitus with other specified complication: E11.69

## 2017-02-08 ENCOUNTER — Encounter: Payer: Self-pay | Admitting: *Deleted

## 2017-02-08 ENCOUNTER — Telehealth: Payer: Self-pay | Admitting: *Deleted

## 2017-02-08 DIAGNOSIS — I208 Other forms of angina pectoris: Secondary | ICD-10-CM

## 2017-02-08 NOTE — Telephone Encounter (Signed)
[name] called to let us know that Ronald Wallace has been out with shingles in his ear.  We are going to give a couple weeks to see if he gets any better.

## 2017-02-14 ENCOUNTER — Encounter: Payer: Self-pay | Admitting: *Deleted

## 2017-02-14 DIAGNOSIS — I208 Other forms of angina pectoris: Secondary | ICD-10-CM

## 2017-02-14 NOTE — Progress Notes (Signed)
Cardiac Individual Treatment Plan  Patient Details  Name: Ronald Wallace MRN: [id number] Date of Birth: 1940-01-24 Referring Provider:     Cardiac Rehab from 12/14/2016 in [facility] Cardiac and Pulmonary Rehab  Referring Provider  [name]      Initial Encounter Date:    Cardiac Rehab from 12/14/2016 in [facility] Cardiac [facility] Rehab  Date  12/14/16  Referring Provider  Rockey Situ      Visit Diagnosis: Stable angina ([place])  Patient's Home Medications on Admission:  Current Outpatient Medications:  .  aspirin 81 MG tablet, Take 81 mg by mouth daily., Disp: , Rfl:  .  calcium carbonate (OS-CAL) 600 MG TABS tablet, Take 600 mg by mouth daily with breakfast., Disp: , Rfl:  .  cholecalciferol (VITAMIN D) 1000 UNITS tablet, Take 1,000 Units by mouth daily., Disp: , Rfl:  .  Ferrous Sulfate (IRON) 325 (65 FE) MG TABS, Take 1 tablet by mouth daily. , Disp: , Rfl:  .  furosemide (LASIX) 20 MG tablet, Take 1 tablet (20 mg total) by mouth as needed for fluid (for shortness of breath)., Disp: 30 tablet, Rfl: 6 .  gabapentin (NEURONTIN) 300 MG capsule, Take 600 mg by mouth at bedtime., Disp: , Rfl:  .  hydrochlorothiazide (MICROZIDE) 12.5 MG capsule, Take 12.5 mg by mouth daily. , Disp: , Rfl:  .  isosorbide mononitrate (IMDUR) 30 MG 24 hr tablet, Take 1 tablet (30 mg total) by mouth daily., Disp: 90 tablet, Rfl: 1 .  LANTUS 100 UNIT/ML injection, Inject 42 Units into the skin daily. Take at bedtime, Disp: , Rfl:  .  lisinopril (PRINIVIL,ZESTRIL) 20 MG tablet, Take 20 mg by mouth daily., Disp: , Rfl:  .  metFORMIN (GLUCOPHAGE) 500 MG tablet, Take 250 mg by mouth 2 (two) times daily with a meal., Disp: , Rfl:  .  NOVOLOG 100 UNIT/ML injection, Inject into the skin 3 (three) times daily with meals. 12 units am, 12 units lunch, 14 units supper, Disp: , Rfl:  .  simvastatin (ZOCOR) 20 MG tablet, Take 20 mg by mouth daily., Disp: , Rfl:  .  tamsulosin (FLOMAX) 0.4 MG CAPS capsule, Take 0.4 mg by mouth  daily., Disp: , Rfl:   Past Medical History: Past Medical History:  Diagnosis Date  . Anemia   . CAD (coronary artery disease)    a. 02/2014 Neg Ex MV, EF 65%;  b. 06/2015 Abnl MV ([facility])-->Cath ([facility]): LM nl, LAD 30p/m, LCX 30p, OM2 50p, RCA 100p w/ L-->R collats, nl EF-->Med Rx.  . Diabetes mellitus, type II, insulin dependent ([place])    With neuropathy  . Diverticulitis   . Essential hypertension   . Hyperlipidemia   . Neuropathy   . Shortness of breath dyspnea   . Syncope and collapse    a. 02/2014 Carotid Dopplers: 1-39% bilateral ICA with normal Subclavian & vertebral arteries; b. 02/2014 2 D Echo: EF 50-55%, Gr 1 DD, Mild MR & LA dilation -- was bradycardic in 40s.    Tobacco Use: Social History   Tobacco Use  Smoking Status Never Smoker  Smokeless Tobacco Never Used    Labs: Recent Review Flowsheet Data    There is no flowsheet data to display.       Exercise Target Goals:    Exercise Program Goal: Individual exercise prescription set using results from initial 6 min walk test and THRR while considering  patient's activity barriers and safety.   Exercise Prescription Goal: Initial exercise prescription builds to 30-45 minutes a  day of aerobic activity, 2-3 days per week.  Home exercise guidelines will be given to patient during program as part of exercise prescription that the participant will acknowledge.  Activity Barriers & Risk Stratification: Activity Barriers & Cardiac Risk Stratification - 12/14/16 1319      Activity Barriers & Cardiac Risk Stratification   Activity Barriers  Arthritis;Chest Pain/Angina;Neck/Spine Problems    Cardiac Risk Stratification  High       6 Minute Walk: 6 Minute Walk    Row Name 12/14/16 1501         6 Minute Walk   Distance  1488 feet     Walk Time  6 minutes     # of Rest Breaks  0     MPH  2.82     METS  3.25     RPE  9     Perceived Dyspnea   3     VO2 Peak  11.36     Symptoms  No     Resting HR  63 bpm      Resting BP  132/64     Resting Oxygen Saturation   96 %     Exercise Oxygen Saturation  during 6 min walk  95 %     Max Ex. HR  129 bpm     Max Ex. BP  162/64     2 Minute Post BP  130/60        Oxygen Initial Assessment:   Oxygen Re-Evaluation:   Oxygen Discharge (Final Oxygen Re-Evaluation):   Initial Exercise Prescription: Initial Exercise Prescription - 12/14/16 1500      Date of Initial Exercise RX and Referring Provider   Date  12/14/16    Referring Provider  [name]      Treadmill   MPH  2.7    Grade  0.5    Minutes  15    METs  3.25      NuStep   Level  3    SPM  80    Minutes  15    METs  3.2      REL-XR   Level  2    Speed  50    Minutes  15    METs  3.25      Prescription Details   Frequency (times per week)  3    Duration  Progress to 45 minutes of aerobic exercise without signs/symptoms of physical distress      Intensity   THRR 40-80% of Max Heartrate  96-129    Ratings of Perceived Exertion  11-13    Perceived Dyspnea  0-4      Resistance Training   Training Prescription  Yes    Weight  3 lb    Reps  10-15       Perform Capillary Blood Glucose checks as needed.  Exercise Prescription Changes: Exercise Prescription Changes    Row Name 12/26/16 1300 01/24/17 1500 01/31/17 0900 02/05/17 1500       Response to Exercise   Blood Pressure (Admit)  134/70  132/64  -  122/60    Blood Pressure (Exercise)  134/60  144/52  -  126/74    Blood Pressure (Exit)  126/54  166/80  -  126/70    Heart Rate (Admit)  73 bpm  60 bpm  -  65 bpm    Heart Rate (Exercise)  105 bpm  110 bpm  -  107 bpm    Heart Rate (Exit)  68 bpm  70 bpm  -  62 bpm    Rating of Perceived Exertion (Exercise)  13  14  -  12    Symptoms  none  none  -  none    Comments  first full day of exercise  third full day of exercise  -  -    Duration  Progress to 45 minutes of aerobic exercise without signs/symptoms of physical distress  Progress to 45 minutes of aerobic  exercise without signs/symptoms of physical distress  -  Continue with 45 min of aerobic exercise without signs/symptoms of physical distress.    Intensity  THRR unchanged  THRR unchanged  -  THRR unchanged      Progression   Progression  Continue to progress workloads to maintain intensity without signs/symptoms of physical distress.  Continue to progress workloads to maintain intensity without signs/symptoms of physical distress.  -  Continue to progress workloads to maintain intensity without signs/symptoms of physical distress.    Average METs  3.22  2.98  -  3.42      Resistance Training   Training Prescription  Yes  Yes  -  Yes    Weight  3 lb  3 lb  -  3 lb    Reps  10-15  10-15  -  10-15      Interval Training   Interval Training  No  No  -  No      Treadmill   MPH  2.7  2.7  -  2.9    Grade  0.5  0.5  -  1    Minutes  15  15  -  15    METs  3.25  3.25  -  3.62      NuStep   Level  3  3  -  3    Minutes  15  15  -  15    METs  3  4  -  3.4      REL-XR   Level  2  2  -  2    Minutes  15  15  -  15    METs  3.4  1.7  -  3.4      Home Exercise Plan   Plans to continue exercise at  -  -  [employer] (comment) walking, treadmill, [employer] (comment) walking, treadmill, [employer]    Frequency  -  -  Add 2 additional days to program exercise sessions.  Add 2 additional days to program exercise sessions.    Initial Home Exercises Provided  -  -  01/31/17  01/31/17       Exercise Comments: Exercise Comments    Row Name 12/22/16 [phone]           Exercise Comments  First full day of exercise!  Patient was oriented to gym and equipment including functions, settings, policies, and procedures.  Patient's individual exercise prescription and treatment plan were reviewed.  All starting workloads were established based on the results of the 6 minute walk test done at initial orientation visit.  The plan for exercise progression was also introduced and progression  will be customized based on patient's performance and goals.          Exercise Goals and Review: Exercise Goals    Row Name 12/14/16 1500             Exercise Goals   Increase Physical Activity  Yes  Intervention  Provide advice, education, support and counseling about physical activity/exercise needs.;Develop an individualized exercise prescription for aerobic and resistive training based on initial evaluation findings, risk stratification, comorbidities and participant's personal goals.       Expected Outcomes  Achievement of increased cardiorespiratory fitness and enhanced flexibility, muscular endurance and strength shown through measurements of functional capacity and personal statement of participant.       Increase Strength and Stamina  Yes       Intervention  Provide advice, education, support and counseling about physical activity/exercise needs.;Develop an individualized exercise prescription for aerobic and resistive training based on initial evaluation findings, risk stratification, comorbidities and participant's personal goals.       Expected Outcomes  Achievement of increased cardiorespiratory fitness and enhanced flexibility, muscular endurance and strength shown through measurements of functional capacity and personal statement of participant.       Able to understand and use rate of perceived exertion (RPE) scale  Yes       Intervention  Provide education and explanation on how to use RPE scale       Expected Outcomes  Short Term: Able to use RPE daily in rehab to express subjective intensity level;Long Term:  Able to use RPE to guide intensity level when exercising independently       Able to understand and use Dyspnea scale  Yes       Intervention  Provide education and explanation on how to use Dyspnea scale       Expected Outcomes  Short Term: Able to use Dyspnea scale daily in rehab to express subjective sense of shortness of breath during exertion;Long Term: Able  to use Dyspnea scale to guide intensity level when exercising independently       Knowledge and understanding of Target Heart Rate Range (THRR)  Yes       Intervention  Provide education and explanation of THRR including how the numbers were predicted and where they are located for reference       Expected Outcomes  Short Term: Able to state/look up THRR;Long Term: Able to use THRR to govern intensity when exercising independently;Short Term: Able to use daily as guideline for intensity in rehab       Able to check pulse independently  Yes       Intervention  Provide education and demonstration on how to check pulse in carotid and radial arteries.;Review the importance of being able to check your own pulse for safety during independent exercise       Expected Outcomes  Short Term: Able to explain why pulse checking is important during independent exercise;Long Term: Able to check pulse independently and accurately       Understanding of Exercise Prescription  Yes       Intervention  Provide education, explanation, and written materials on patient's individual exercise prescription       Expected Outcomes  Short Term: Able to explain program exercise prescription;Long Term: Able to explain home exercise prescription to exercise independently          Exercise Goals Re-Evaluation : Exercise Goals Re-Evaluation    Row Name 12/22/16 2951 01/10/17 1511 01/24/17 1528 01/31/17 0847 02/05/17 1537     Exercise Goal Re-Evaluation   Exercise Goals Review  Understanding of Exercise Prescription;Knowledge and understanding of Target Heart Rate Range (THRR);Able to understand and use rate of perceived exertion (RPE) scale  -  Increase Physical Activity;Increase Strength and Stamina;Understanding of Exercise Prescription  Increase Physical Activity;Increase Strength  and Stamina;Understanding of Exercise Prescription  Increase Physical Activity;Increase Strength and Stamina;Understanding of Exercise Prescription     Comments  Reviewed RPE scale, THR and program prescription with pt today.  Pt voiced understanding and was given a copy of goals to take home.   After some dermatology surgery, Ronald Wallace's doctor requested that he stay out until after the new year.   Ronald Wallace is off to a good start in rehab.  He has had offically three full days of exercise due to his surgery.  He was able to return at his current workloads without a problem.  We will start to increase his workloads and continue to monitor his progress.   Ronald Wallace is doing well in rehab. He is feeling stronger and has more stamina.   Currently, he has been walking some at home.  Reviewed home exercise with pt today.  Pt plans to walking, treadmill, and returning to [facility]  for exercise.  Reviewed THR, pulse, RPE, sign and symptoms, and when to call 911 or MD.  Also discussed weather considerations and indoor options.  Pt voiced understanding.  Ronald Wallace has been doing well in rehab.  He is now up to 3.4 METs on both the XR and NuStep.  We will continue to monitor his progression.    Expected Outcomes  Short: Use RPE daily to regulate intensity.  Long: Follow program prescription in THR.  -  Short: Increase incline on treadmill and review home exercise.  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   Short: Return to [facility] for exercise.  Long: Continue to increase strength and stamina.   Short: Increase workloads on XR and NuStep.  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       Discharge Exercise Prescription (Final Exercise Prescription Changes): Exercise Prescription Changes - 02/05/17 1500      Response to Exercise   Blood Pressure (Admit)  122/60    Blood Pressure (Exercise)  126/74    Blood Pressure (Exit)  126/70    Heart Rate (Admit)  65 bpm    Heart Rate (Exercise)  107 bpm    Heart Rate (Exit)  62 bpm    Rating of Perceived Exertion (Exercise)  12    Symptoms  none    Duration  Continue with 45 min of aerobic  exercise without signs/symptoms of physical distress.    Intensity  THRR unchanged      Progression   Progression  Continue to progress workloads to maintain intensity without signs/symptoms of physical distress.    Average METs  3.42      Resistance Training   Training Prescription  Yes    Weight  3 lb    Reps  10-15      Interval Training   Interval Training  No      Treadmill   MPH  2.9    Grade  1    Minutes  15    METs  3.62      NuStep   Level  3    Minutes  15    METs  3.4      REL-XR   Level  2    Minutes  15    METs  3.4      Home Exercise Plan   Plans to continue exercise at  [employer] (comment) walking, treadmill, [employer]    Frequency  Add 2 additional days to program exercise sessions.    Initial Home Exercises Provided  01/31/17       Nutrition:  Target Goals: Understanding of nutrition guidelines, daily intake of sodium <156m, cholesterol <2052m calories 30% from fat and 7% or less from saturated fats, daily to have 5 or more servings of fruits and vegetables.  Biometrics: Pre Biometrics - 12/14/16 1459      Pre Biometrics   Height  5' 8.5" (1.74 m)    Weight  198 lb 1.6 oz (89.9 kg)    Waist Circumference  41.5 inches    Hip Circumference  40 inches    Waist to Hip Ratio  1.04 %    BMI (Calculated)  29.68    Single Leg Stand  20.27 seconds        Nutrition Therapy Plan and Nutrition Goals: Nutrition Therapy & Goals - 12/14/16 1320      Nutrition Therapy   RD appointment deferred  Yes    Drug/Food Interactions  Statins/Certain Fruits       Nutrition Assessments: Nutrition Assessments - 12/14/16 1320      MEDFICTS Scores   Pre Score  20       Nutrition Goals Re-Evaluation: Nutrition Goals Re-Evaluation    Row Name 01/31/17 0851             Goals   Nutrition Goal  Meet with nutritionist on 1/30 during class       Comment  HaJoneen Boersould like to meet with nutrtionist during class as lives is [employer] He would  like to just hear some tips for weight loss and how to cut back on his snacking.        Expected Outcome  Short: Meet with dietician  Long: Cut back on snacking.           Nutrition Goals Discharge (Final Nutrition Goals Re-Evaluation): Nutrition Goals Re-Evaluation - 01/31/17 0851      Goals   Nutrition Goal  Meet with nutritionist on 1/30 during class    Comment  HaJoneen Boersould like to meet with nutrtionist during class as lives is [employer] He would like to just hear some tips for weight loss and how to cut back on his snacking.     Expected Outcome  Short: Meet with dietician  Long: Cut back on snacking.        Psychosocial: Target Goals: Acknowledge presence or absence of significant depression and/or stress, maximize coping skills, provide positive support system. Participant is able to verbalize types and ability to use techniques and skills needed for reducing stress and depression.   Initial Review & Psychosocial Screening: Initial Psych Review & Screening - 12/14/16 1313      Initial Review   Current issues with  Current Sleep Concerns;Current Stress Concerns    Source of Stress Concerns  Chronic Illness      Family Dynamics   Good Support System?  Yes      Barriers   Psychosocial barriers to participate in program  The patient should benefit from training in stress management and relaxation.      Screening Interventions   Interventions  Encouraged to exercise;Yes;Program counselor consult    Expected Outcomes  Short Term goal: Utilizing psychosocial counselor, staff and physician to assist with identification of specific Stressors or current issues interfering with healing process. Setting desired goal for each stressor or current issue identified.;Long Term Goal: Stressors or current issues are controlled or eliminated.;Short Term goal: Identification and review with participant of any Quality of Life or Depression concerns found  by scoring the questionnaire.;Long  Term goal: The participant improves quality of Life and PHQ9 Scores as seen by post scores and/or verbalization of changes       Quality of Life Scores:  Quality of Life - 12/14/16 1319      Quality of Life Scores   GLOBAL Pre  17.18 %      Scores of 19 and below usually indicate a poorer quality of life in these areas.  A difference of  2-3 points is a clinically meaningful difference.  A difference of 2-3 points in the total score of the Quality of Life Index has been associated with significant improvement in overall quality of life, self-image, physical symptoms, and general health in studies assessing change in quality of life.  PHQ-9: Recent Review Flowsheet Data    Depression screen [facility] 2/9 01/24/2017 12/14/2016 12/14/2016   Decreased Interest 1 - 0   Down, Depressed, Hopeless 1 - 2   PHQ - 2 Score 2 - 2   Altered sleeping 0 - 3   Tired, decreased energy 1 - 2   Change in appetite 0 - 0   Feeling bad or failure about yourself  0 - 1   Trouble concentrating 0 - 3   Moving slowly or fidgety/restless 0 - 0   Suicidal thoughts 0 0  1   PHQ-9 Score 3 - 12   Difficult doing work/chores Not difficult at all - -     Interpretation of Total Score  Total Score Depression Severity:  1-4 = Minimal depression, 5-9 = Mild depression, 10-14 = Moderate depression, 15-19 = Moderately severe depression, 20-27 = Severe depression   Psychosocial Evaluation and Intervention: Psychosocial Evaluation - 01/22/17 0946      Psychosocial Evaluation & Interventions   Interventions  Encouraged to exercise with the program and follow exercise prescription;Stress management education    Comments  Counselor met with Mr. Ing Ronald Wallace) today for initial psychosocial evaluation.  He is a 77 year old who was recently diagnosed with stable angina.  He has a strong support system with a spouse of 63 years; a daughter and nephew who live locally and active involvement in his local church community.  Ronald Wallace is  also a diabetic and has had some dermatological issues preventing him from being consistent in his attendance lately.  He reports sleeping "too much" with ~10 hours at night and sometimes over 2 hours during the day.  He reports having a good appetite and denies a history of depression.  But Ronald Wallace states he struggles with anxiety at times and was tearful discussing concerns for his grandchildren and somewhat estranged from his son who lives in [place].  He reports his mood is generally positive most of the time.  Ronald Wallace has multiple stressors with his health; concerns for his grandchildren and his relationship with his son.  He has goals to increase his stamina and strength and improve his flexibility and balance while in this program.  He would like his quality of life to improve overall as well.  Counselor encouraged Ronald Wallace to find ways to stay busy during the day since he reports being bored often during the winter months.  Counselor also mentioned to [place] to consider taking a train or bus to visit his son in [place] since he refuses to drive or fly that far at this time.  He agreed to consider this option since he misses his son so much and was very tearful about this relationship.  Counselor and  staff will follow with Ronald Wallace throughout the course of this program.      Expected Outcomes  Ronald Wallace will exercise consistently to achieve his stated goals.  He will consider positive ways to improve his quality of life as suggested.  The educational and psychoeducational components of this program will be beneficial for Ronald Wallace to learn more about his condition and positive ways to cope in general.      Continue Psychosocial Services   Follow up required by staff       Psychosocial Re-Evaluation: Psychosocial Re-Evaluation    [place] Name 01/31/17 0855             Psychosocial Re-Evaluation   Current issues with  Current Stress Concerns       Comments  Ronald Wallace has been doing well in rehab.  He remains postive  overall.  He and his son are doing better and he's doing better too.  His son is out in [place] skiing, but called him on Sunday.  He continues to sleep well.  He has found that exercise helps him feel better and he is not as sore any more.        Expected Outcomes  Short: Continue to attend rehab and reduce soreness.  Long: Continue to maintain positive attitude.           Psychosocial Discharge (Final Psychosocial Re-Evaluation): Psychosocial Re-Evaluation - 01/31/17 0855      Psychosocial Re-Evaluation   Current issues with  Current Stress Concerns    Comments  Ronald Wallace has been doing well in rehab.  He remains postive overall.  He and his son are doing better and he's doing better too.  His son is out in [place] skiing, but called him on Sunday.  He continues to sleep well.  He has found that exercise helps him feel better and he is not as sore any more.     Expected Outcomes  Short: Continue to attend rehab and reduce soreness.  Long: Continue to maintain positive attitude.        Vocational Rehabilitation: Provide vocational rehab assistance to qualifying candidates.   Vocational Rehab Evaluation & Intervention:   Education: Education Goals: Education classes will be provided on a variety of topics geared toward better understanding of heart health and risk factor modification. Participant will state understanding/return demonstration of topics presented as noted by education test scores.  Learning Barriers/Preferences:   Education Topics:  AED/CPR: - Group verbal and written instruction with the use of models to demonstrate the basic use of the AED with the basic ABC's of resuscitation.   General Nutrition Guidelines/Fats and Fiber: -Group instruction provided by verbal, written material, models and posters to present the general guidelines for heart healthy nutrition. Gives an explanation and review of dietary fats and fiber.   Controlling Sodium/Reading Food  Labels: -Group verbal and written material supporting the discussion of sodium use in heart healthy nutrition. Review and explanation with models, verbal and written materials for utilization of the food label.   Exercise Physiology & General Exercise Guidelines: - Group verbal and written instruction with models to review the exercise physiology of the cardiovascular system and associated critical values. Provides general exercise guidelines with specific guidelines to those with heart or lung disease.    Aerobic Exercise & Resistance Training: - Gives group verbal and written instruction on the various components of exercise. Focuses on aerobic and resistive training programs and the benefits of this training and how to safely progress through these programs.Marland Kitchen  Flexibility, Balance, Mind/Body Relaxation: Provides group verbal/written instruction on the benefits of flexibility and balance training, including mind/body exercise modes such as yoga, pilates and tai chi.  Demonstration and skill practice provided.   Cardiac Rehab from 01/31/2017 in [facility] Cardiac and Pulmonary Rehab  Date  01/22/17  Educator  [facility]  Instruction Review Code  1- Verbalizes Understanding      Stress and Anxiety: - Provides group verbal and written instruction about the health risks of elevated stress and causes of high stress.  Discuss the correlation between heart/lung disease and anxiety and treatment options. Review healthy ways to manage with stress and anxiety.   Depression: - Provides group verbal and written instruction on the correlation between heart/lung disease and depressed mood, treatment options, and the stigmas associated with seeking treatment.   Anatomy & Physiology of the Heart: - Group verbal and written instruction and models provide basic cardiac anatomy and physiology, with the coronary electrical and arterial systems. Review of Valvular disease and Heart Failure   Cardiac Procedures: -  Group verbal and written instruction to review commonly prescribed medications for heart disease. Reviews the medication, class of the drug, and side effects. Includes the steps to properly store meds and maintain the prescription regimen. (beta blockers and nitrates)   Cardiac Medications I: - Group verbal and written instruction to review commonly prescribed medications for heart disease. Reviews the medication, class of the drug, and side effects. Includes the steps to properly store meds and maintain the prescription regimen.   Cardiac Rehab from 01/31/2017 in [facility] Cardiac and Pulmonary Rehab  Date  01/29/17  Educator  [place]  Instruction Review Code  1- Verbalizes Understanding      Cardiac Medications II: -Group verbal and written instruction to review commonly prescribed medications for heart disease. Reviews the medication, class of the drug, and side effects. (all other drug classes)    Go Sex-Intimacy & Heart Disease, Get SMART - Goal Setting: - Group verbal and written instruction through game format to discuss heart disease and the return to sexual intimacy. Provides group verbal and written material to discuss and apply goal setting through the application of the S.M.A.R.T. Method.   Other Matters of the Heart: - Provides group verbal, written materials and models to describe Stable Angina and Peripheral Artery. Includes description of the disease process and treatment options available to the cardiac patient.   Exercise & Equipment Safety: - Individual verbal instruction and demonstration of equipment use and safety with use of the equipment.   Cardiac Rehab from 01/31/2017 in [facility] Cardiac and Pulmonary Rehab  Date  01/22/17  Educator  [facility]  Instruction Review Code  1- Verbalizes Understanding      Infection Prevention: - Provides verbal and written material to individual with discussion of infection control including proper hand washing and proper equipment cleaning during  exercise session.   Cardiac Rehab from 01/31/2017 in [facility] Cardiac and Pulmonary Rehab  Date  01/22/17  Educator  [facility]  Instruction Review Code  1- Verbalizes Understanding      Falls Prevention: - Provides verbal and written material to individual with discussion of falls prevention and safety.   Diabetes: - Individual verbal and written instruction to review signs/symptoms of diabetes, desired ranges of glucose level fasting, after meals and with exercise. Acknowledge that pre and post exercise glucose checks will be done for 3 sessions at entry of program.   Know Your Numbers and Risk Factors: -Group verbal and written instruction about important numbers  in your health.  Discussion of what are risk factors and how they play a role in the disease process.  Review of Cholesterol, Blood Pressure, Diabetes, and BMI and the role they play in your overall health.   Sleep Hygiene: -Provides group verbal and written instruction about how sleep can affect your health.  Define sleep hygiene, discuss sleep cycles and impact of sleep habits. Review good sleep hygiene tips.    Other: -Provides group and verbal instruction on various topics (see comments)   Cardiac Rehab from 01/31/2017 in [facility] Cardiac and Pulmonary Rehab  Date  01/24/17  Educator  [facility]  Instruction Review Code  1- Verbalizes Understanding [Risk Factors and Know Your Numbers]      Knowledge Questionnaire Score:   Core Components/Risk Factors/Patient Goals at Admission: Personal Goals and Risk Factors at Admission - 12/14/16 1507      Core Components/Risk Factors/Patient Goals on Admission    Weight Management  Yes    Intervention  Weight Management: Develop a combined nutrition and exercise program designed to reach desired caloric intake, while maintaining appropriate intake of nutrient and fiber, sodium and fats, and appropriate energy expenditure required for the weight goal.;Weight Management/Obesity: Establish reasonable  short term and long term weight goals.;Obesity: Provide education and appropriate resources to help participant work on and attain dietary goals.    Admit Weight  198 lb 1.6 oz (89.9 kg)    Goal Weight: Short Term  190 lb (86.2 kg)       Core Components/Risk Factors/Patient Goals Review:  Goals and Risk Factor Review    Row Name 01/31/17 0847             Core Components/Risk Factors/Patient Goals Review   Personal Goals Review  Weight Management/Obesity;Hypertension;Diabetes;Lipids;Heart Failure       Review  Ronald Wallace has been doing well in rehab.  He noticed that his weight was up a little today and admitted to eating more since his  wife was out.  He weighs daily to monitor his heart failure and has not had any symptoms.  He also monitors his salt intake.  His blood sugars have been pretty good on average. He has been watching what he eats.  He has noticed that his sugars are better when he exercises.  His wife is also diabetic and they work together to monitor their sugars.  His blood pressures have been pretty good, he checks them about  every other day.  He feels that his medications are working for him.        Expected Outcomes  Short: Continue to work on weight loss.  Long: Contiue to manage diabetes.           Core Components/Risk Factors/Patient Goals at Discharge (Final Review):  Goals and Risk Factor Review - 01/31/17 0847      Core Components/Risk Factors/Patient Goals Review   Personal Goals Review  Weight Management/Obesity;Hypertension;Diabetes;Lipids;Heart Failure    Review  Ronald Wallace has been doing well in rehab.  He noticed that his weight was up a little today and admitted to eating more since his  wife was out.  He weighs daily to monitor his heart failure and has not had any symptoms.  He also monitors his salt intake.  His blood sugars have been pretty good on average. He has been watching what he eats.  He has noticed that his sugars are better when he exercises.  His wife  is also diabetic and they work together to monitor their sugars.  His blood pressures have been pretty good, he checks them about  every other day.  He feels that his medications are working for him.     Expected Outcomes  Short: Continue to work on weight loss.  Long: Contiue to manage diabetes.        ITP Comments: ITP Comments    Row Name 12/14/16 1321 12/14/16 1324 12/20/16 0545 01/10/17 1510 01/17/17 0630   ITP Comments  ITP created during Medical Review/Orientation appt after Cardiac REhab informed consent was signed.   Stable angina note documented in Dr. Donivan Scull 11/27/2016 note  30 day review. Continue with ITP unless directed changes per Medical Director review.   After some dermatology surgery, Ronald Wallace's doctor requested that he stay out until after the new year.   30 day review. Continue with ITP unless directed changes per Medical Director review.   1 visit since last review   Row Name 02/08/17 1123 02/14/17 0557         ITP Comments  Mrs. Jurewicz called to let us know that Ronald Wallace has been out with shingles in his ear.  We are going to give a couple weeks to see if he gets any better.   30 Day review. Continue with ITP unless directed changes per Medical Director review.          Comments:

## 2017-02-16 ENCOUNTER — Encounter: Payer: PPO | Attending: Cardiovascular Disease

## 2017-02-16 DIAGNOSIS — E1140 Type 2 diabetes mellitus with diabetic neuropathy, unspecified: Secondary | ICD-10-CM | POA: Insufficient documentation

## 2017-02-16 DIAGNOSIS — Z794 Long term (current) use of insulin: Secondary | ICD-10-CM | POA: Insufficient documentation

## 2017-02-16 DIAGNOSIS — I10 Essential (primary) hypertension: Secondary | ICD-10-CM | POA: Insufficient documentation

## 2017-02-16 DIAGNOSIS — E785 Hyperlipidemia, unspecified: Secondary | ICD-10-CM | POA: Insufficient documentation

## 2017-02-16 DIAGNOSIS — Z79899 Other long term (current) drug therapy: Secondary | ICD-10-CM | POA: Insufficient documentation

## 2017-02-16 DIAGNOSIS — Z7982 Long term (current) use of aspirin: Secondary | ICD-10-CM | POA: Insufficient documentation

## 2017-02-16 DIAGNOSIS — I25118 Atherosclerotic heart disease of native coronary artery with other forms of angina pectoris: Secondary | ICD-10-CM | POA: Insufficient documentation

## 2017-02-20 ENCOUNTER — Encounter: Payer: Self-pay | Admitting: *Deleted

## 2017-02-20 ENCOUNTER — Telehealth: Payer: Self-pay | Admitting: *Deleted

## 2017-02-20 DIAGNOSIS — I208 Other forms of angina pectoris: Secondary | ICD-10-CM

## 2017-02-20 NOTE — Telephone Encounter (Signed)
Called to check on Ronald Wallace with his Shingles. He is still taking his medications and has some open wounds.  Talked about making sure he finishes his medications and all wounds are sealed before he is able to return to rehab.

## 2017-03-01 DIAGNOSIS — B029 Zoster without complications: Secondary | ICD-10-CM | POA: Diagnosis not present

## 2017-03-05 ENCOUNTER — Telehealth: Payer: Self-pay | Admitting: *Deleted

## 2017-03-05 ENCOUNTER — Encounter: Payer: Self-pay | Admitting: *Deleted

## 2017-03-05 DIAGNOSIS — I208 Other forms of angina pectoris: Secondary | ICD-10-CM

## 2017-03-05 NOTE — Telephone Encounter (Signed)
Called to check on status of return.  He continues to have pain from Shingles.  The rash has improved per office notes but he says the pain is still in his back.  We will continue to touch base with him about his progress.

## 2017-03-14 ENCOUNTER — Encounter: Payer: Self-pay | Admitting: *Deleted

## 2017-03-14 DIAGNOSIS — I208 Other forms of angina pectoris: Secondary | ICD-10-CM

## 2017-03-14 NOTE — Progress Notes (Signed)
Cardiac Individual Treatment Plan  Patient Details  Name: Ronald Wallace MRN: [id number] Date of Birth: 04-Aug-1940 Referring Provider:     Cardiac Rehab from 12/14/2016 in [facility] Cardiac and Pulmonary Rehab  Referring Provider  [name]      Initial Encounter Date:    Cardiac Rehab from 12/14/2016 in [facility] Cardiac and Pulmonary Rehab  Date  12/14/16  Referring Provider  Rockey Situ      Visit Diagnosis: Stable angina ([place])  Patient's Home Medications on Admission:  Current Outpatient Medications:  .  aspirin 81 MG tablet, Take 81 mg by mouth daily., Disp: , Rfl:  .  calcium carbonate (OS-CAL) 600 MG TABS tablet, Take 600 mg by mouth daily with breakfast., Disp: , Rfl:  .  cholecalciferol (VITAMIN D) 1000 UNITS tablet, Take 1,000 Units by mouth daily., Disp: , Rfl:  .  Ferrous Sulfate (IRON) 325 (65 FE) MG TABS, Take 1 tablet by mouth daily. , Disp: , Rfl:  .  furosemide (LASIX) 20 MG tablet, Take 1 tablet (20 mg total) by mouth as needed for fluid (for shortness of breath)., Disp: 30 tablet, Rfl: 6 .  gabapentin (NEURONTIN) 300 MG capsule, Take 600 mg by mouth at bedtime., Disp: , Rfl:  .  hydrochlorothiazide (MICROZIDE) 12.5 MG capsule, Take 12.5 mg by mouth daily. , Disp: , Rfl:  .  isosorbide mononitrate (IMDUR) 30 MG 24 hr tablet, Take 1 tablet (30 mg total) by mouth daily., Disp: 90 tablet, Rfl: 1 .  LANTUS 100 UNIT/ML injection, Inject 42 Units into the skin daily. Take at bedtime, Disp: , Rfl:  .  lisinopril (PRINIVIL,ZESTRIL) 20 MG tablet, Take 20 mg by mouth daily., Disp: , Rfl:  .  metFORMIN (GLUCOPHAGE) 500 MG tablet, Take 250 mg by mouth 2 (two) times daily with a meal., Disp: , Rfl:  .  NOVOLOG 100 UNIT/ML injection, Inject into the skin 3 (three) times daily with meals. 12 units am, 12 units lunch, 14 units supper, Disp: , Rfl:  .  simvastatin (ZOCOR) 20 MG tablet, Take 20 mg by mouth daily., Disp: , Rfl:  .  tamsulosin (FLOMAX) 0.4 MG CAPS capsule, Take 0.4 mg by mouth  daily., Disp: , Rfl:   Past Medical History: Past Medical History:  Diagnosis Date  . Anemia   . CAD (coronary artery disease)    a. 02/2014 Neg Ex MV, EF 65%;  b. 06/2015 Abnl MV ([facility])-->Cath ([facility]): LM nl, LAD 30p/m, LCX 30p, OM2 50p, RCA 100p w/ L-->R collats, nl EF-->Med Rx.  . Diabetes mellitus, type II, insulin dependent ([place])    With neuropathy  . Diverticulitis   . Essential hypertension   . Hyperlipidemia   . Neuropathy   . Shortness of breath dyspnea   . Syncope and collapse    a. 02/2014 Carotid Dopplers: 1-39% bilateral ICA with normal Subclavian & vertebral arteries; b. 02/2014 2 D Echo: EF 50-55%, Gr 1 DD, Mild MR & LA dilation -- was bradycardic in 40s.    Tobacco Use: Social History   Tobacco Use  Smoking Status Never Smoker  Smokeless Tobacco Never Used    Labs: Recent Review Flowsheet Data    There is no flowsheet data to display.       Exercise Target Goals:    Exercise Program Goal: Individual exercise prescription set using results from initial 6 min walk test and THRR while considering  patient's activity barriers and safety.   Exercise Prescription Goal: Initial exercise prescription builds to 30-45 minutes a  day of aerobic activity, 2-3 days per week.  Home exercise guidelines will be given to patient during program as part of exercise prescription that the participant will acknowledge.  Activity Barriers & Risk Stratification: Activity Barriers & Cardiac Risk Stratification - 12/14/16 1319      Activity Barriers & Cardiac Risk Stratification   Activity Barriers  Arthritis;Chest Pain/Angina;Neck/Spine Problems    Cardiac Risk Stratification  High       6 Minute Walk: 6 Minute Walk    Row Name 12/14/16 1501         6 Minute Walk   Distance  1488 feet     Walk Time  6 minutes     # of Rest Breaks  0     MPH  2.82     METS  3.25     RPE  9     Perceived Dyspnea   3     VO2 Peak  11.36     Symptoms  No     Resting HR  63 bpm      Resting BP  132/64     Resting Oxygen Saturation   96 %     Exercise Oxygen Saturation  during 6 min walk  95 %     Max Ex. HR  129 bpm     Max Ex. BP  162/64     2 Minute Post BP  130/60        Oxygen Initial Assessment:   Oxygen Re-Evaluation:   Oxygen Discharge (Final Oxygen Re-Evaluation):   Initial Exercise Prescription: Initial Exercise Prescription - 12/14/16 1500      Date of Initial Exercise RX and Referring Provider   Date  12/14/16    Referring Provider  [name]      Treadmill   MPH  2.7    Grade  0.5    Minutes  15    METs  3.25      NuStep   Level  3    SPM  80    Minutes  15    METs  3.2      REL-XR   Level  2    Speed  50    Minutes  15    METs  3.25      Prescription Details   Frequency (times per week)  3    Duration  Progress to 45 minutes of aerobic exercise without signs/symptoms of physical distress      Intensity   THRR 40-80% of Max Heartrate  96-129    Ratings of Perceived Exertion  11-13    Perceived Dyspnea  0-4      Resistance Training   Training Prescription  Yes    Weight  3 lb    Reps  10-15       Perform Capillary Blood Glucose checks as needed.  Exercise Prescription Changes: Exercise Prescription Changes    Row Name 12/26/16 1300 01/24/17 1500 01/31/17 0900 02/05/17 1500       Response to Exercise   Blood Pressure (Admit)  134/70  132/64  -  122/60    Blood Pressure (Exercise)  134/60  144/52  -  126/74    Blood Pressure (Exit)  126/54  166/80  -  126/70    Heart Rate (Admit)  73 bpm  60 bpm  -  65 bpm    Heart Rate (Exercise)  105 bpm  110 bpm  -  107 bpm    Heart Rate (Exit)  68 bpm  70 bpm  -  62 bpm    Rating of Perceived Exertion (Exercise)  13  14  -  12    Symptoms  none  none  -  none    Comments  first full day of exercise  third full day of exercise  -  -    Duration  Progress to 45 minutes of aerobic exercise without signs/symptoms of physical distress  Progress to 45 minutes of aerobic  exercise without signs/symptoms of physical distress  -  Continue with 45 min of aerobic exercise without signs/symptoms of physical distress.    Intensity  THRR unchanged  THRR unchanged  -  THRR unchanged      Progression   Progression  Continue to progress workloads to maintain intensity without signs/symptoms of physical distress.  Continue to progress workloads to maintain intensity without signs/symptoms of physical distress.  -  Continue to progress workloads to maintain intensity without signs/symptoms of physical distress.    Average METs  3.22  2.98  -  3.42      Resistance Training   Training Prescription  Yes  Yes  -  Yes    Weight  3 lb  3 lb  -  3 lb    Reps  10-15  10-15  -  10-15      Interval Training   Interval Training  No  No  -  No      Treadmill   MPH  2.7  2.7  -  2.9    Grade  0.5  0.5  -  1    Minutes  15  15  -  15    METs  3.25  3.25  -  3.62      NuStep   Level  3  3  -  3    Minutes  15  15  -  15    METs  3  4  -  3.4      REL-XR   Level  2  2  -  2    Minutes  15  15  -  15    METs  3.4  1.7  -  3.4      Home Exercise Plan   Plans to continue exercise at  -  -  [employer] (comment) walking, treadmill, [employer] (comment) walking, treadmill, [employer]    Frequency  -  -  Add 2 additional days to program exercise sessions.  Add 2 additional days to program exercise sessions.    Initial Home Exercises Provided  -  -  01/31/17  01/31/17       Exercise Comments: Exercise Comments    Row Name 12/22/16 [phone]           Exercise Comments  First full day of exercise!  Patient was oriented to gym and equipment including functions, settings, policies, and procedures.  Patient's individual exercise prescription and treatment plan were reviewed.  All starting workloads were established based on the results of the 6 minute walk test done at initial orientation visit.  The plan for exercise progression was also introduced and progression  will be customized based on patient's performance and goals.          Exercise Goals and Review: Exercise Goals    Row Name 12/14/16 1500             Exercise Goals   Increase Physical Activity  Yes  Intervention  Provide advice, education, support and counseling about physical activity/exercise needs.;Develop an individualized exercise prescription for aerobic and resistive training based on initial evaluation findings, risk stratification, comorbidities and participant's personal goals.       Expected Outcomes  Achievement of increased cardiorespiratory fitness and enhanced flexibility, muscular endurance and strength shown through measurements of functional capacity and personal statement of participant.       Increase Strength and Stamina  Yes       Intervention  Provide advice, education, support and counseling about physical activity/exercise needs.;Develop an individualized exercise prescription for aerobic and resistive training based on initial evaluation findings, risk stratification, comorbidities and participant's personal goals.       Expected Outcomes  Achievement of increased cardiorespiratory fitness and enhanced flexibility, muscular endurance and strength shown through measurements of functional capacity and personal statement of participant.       Able to understand and use rate of perceived exertion (RPE) scale  Yes       Intervention  Provide education and explanation on how to use RPE scale       Expected Outcomes  Short Term: Able to use RPE daily in rehab to express subjective intensity level;Long Term:  Able to use RPE to guide intensity level when exercising independently       Able to understand and use Dyspnea scale  Yes       Intervention  Provide education and explanation on how to use Dyspnea scale       Expected Outcomes  Short Term: Able to use Dyspnea scale daily in rehab to express subjective sense of shortness of breath during exertion;Long Term: Able  to use Dyspnea scale to guide intensity level when exercising independently       Knowledge and understanding of Target Heart Rate Range (THRR)  Yes       Intervention  Provide education and explanation of THRR including how the numbers were predicted and where they are located for reference       Expected Outcomes  Short Term: Able to state/look up THRR;Long Term: Able to use THRR to govern intensity when exercising independently;Short Term: Able to use daily as guideline for intensity in rehab       Able to check pulse independently  Yes       Intervention  Provide education and demonstration on how to check pulse in carotid and radial arteries.;Review the importance of being able to check your own pulse for safety during independent exercise       Expected Outcomes  Short Term: Able to explain why pulse checking is important during independent exercise;Long Term: Able to check pulse independently and accurately       Understanding of Exercise Prescription  Yes       Intervention  Provide education, explanation, and written materials on patient's individual exercise prescription       Expected Outcomes  Short Term: Able to explain program exercise prescription;Long Term: Able to explain home exercise prescription to exercise independently          Exercise Goals Re-Evaluation : Exercise Goals Re-Evaluation    Row Name 12/22/16 1610 01/10/17 1511 01/24/17 1528 01/31/17 0847 02/05/17 1537     Exercise Goal Re-Evaluation   Exercise Goals Review  Understanding of Exercise Prescription;Knowledge and understanding of Target Heart Rate Range (THRR);Able to understand and use rate of perceived exertion (RPE) scale  -  Increase Physical Activity;Increase Strength and Stamina;Understanding of Exercise Prescription  Increase Physical Activity;Increase Strength  and Stamina;Understanding of Exercise Prescription  Increase Physical Activity;Increase Strength and Stamina;Understanding of Exercise Prescription     Comments  Reviewed RPE scale, THR and program prescription with pt today.  Pt voiced understanding and was given a copy of goals to take home.   After some dermatology surgery, Ronald Wallace's doctor requested that he stay out until after the new year.   Ronald Wallace is off to a good start in rehab.  He has had offically three full days of exercise due to his surgery.  He was able to return at his current workloads without a problem.  We will start to increase his workloads and continue to monitor his progress.   Ronald Wallace is doing well in rehab. He is feeling stronger and has more stamina.   Currently, he has been walking some at home.  Reviewed home exercise with pt today.  Pt plans to walking, treadmill, and returning to [facility]  for exercise.  Reviewed THR, pulse, RPE, sign and symptoms, and when to call 911 or MD.  Also discussed weather considerations and indoor options.  Pt voiced understanding.  Ronald Wallace has been doing well in rehab.  He is now up to 3.4 METs on both the XR and NuStep.  We will continue to monitor his progression.    Expected Outcomes  Short: Use RPE daily to regulate intensity.  Long: Follow program prescription in THR.  -  Short: Increase incline on treadmill and review home exercise.  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   Short: Return to [facility] for exercise.  Long: Continue to increase strength and stamina.   Short: Increase workloads on XR and NuStep.  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    [place] Name 02/20/17 1519             Exercise Goal Re-Evaluation   Comments  Out since last review.          Discharge Exercise Prescription (Final Exercise Prescription Changes): Exercise Prescription Changes - 02/05/17 1500      Response to Exercise   Blood Pressure (Admit)  122/60    Blood Pressure (Exercise)  126/74    Blood Pressure (Exit)  126/70    Heart Rate (Admit)  65 bpm    Heart Rate (Exercise)  107 bpm    Heart Rate (Exit)  62 bpm     Rating of Perceived Exertion (Exercise)  12    Symptoms  none    Duration  Continue with 45 min of aerobic exercise without signs/symptoms of physical distress.    Intensity  THRR unchanged      Progression   Progression  Continue to progress workloads to maintain intensity without signs/symptoms of physical distress.    Average METs  3.42      Resistance Training   Training Prescription  Yes    Weight  3 lb    Reps  10-15      Interval Training   Interval Training  No      Treadmill   MPH  2.9    Grade  1    Minutes  15    METs  3.62      NuStep   Level  3    Minutes  15    METs  3.4      REL-XR   Level  2    Minutes  15    METs  3.4      Home Exercise Plan  Plans to continue exercise at  [facility] (comment) walking, treadmill, [employer]    Frequency  Add 2 additional days to program exercise sessions.    Initial Home Exercises Provided  01/31/17       Nutrition:  Target Goals: Understanding of nutrition guidelines, daily intake of sodium <1537m, cholesterol <2033m calories 30% from fat and 7% or less from saturated fats, daily to have 5 or more servings of fruits and vegetables.  Biometrics: Pre Biometrics - 12/14/16 1459      Pre Biometrics   Height  5' 8.5" (1.74 m)    Weight  198 lb 1.6 oz (89.9 kg)    Waist Circumference  41.5 inches    Hip Circumference  40 inches    Waist to Hip Ratio  1.04 %    BMI (Calculated)  29.68    Single Leg Stand  20.27 seconds        Nutrition Therapy Plan and Nutrition Goals: Nutrition Therapy & Goals - 12/14/16 1320      Nutrition Therapy   RD appointment deferred  Yes    Drug/Food Interactions  Statins/Certain Fruits       Nutrition Assessments: Nutrition Assessments - 12/14/16 1320      MEDFICTS Scores   Pre Score  20       Nutrition Goals Re-Evaluation: Nutrition Goals Re-Evaluation    Row Name 01/31/17 0851             Goals   Nutrition Goal  Meet with nutritionist on 1/30 during  class       Comment  HaJoneen Boersould like to meet with nutrtionist during class as lives is [employer] He would like to just hear some tips for weight loss and how to cut back on his snacking.        Expected Outcome  Short: Meet with dietician  Long: Cut back on snacking.           Nutrition Goals Discharge (Final Nutrition Goals Re-Evaluation): Nutrition Goals Re-Evaluation - 01/31/17 0851      Goals   Nutrition Goal  Meet with nutritionist on 1/30 during class    Comment  HaJoneen Boersould like to meet with nutrtionist during class as lives is [employer] He would like to just hear some tips for weight loss and how to cut back on his snacking.     Expected Outcome  Short: Meet with dietician  Long: Cut back on snacking.        Psychosocial: Target Goals: Acknowledge presence or absence of significant depression and/or stress, maximize coping skills, provide positive support system. Participant is able to verbalize types and ability to use techniques and skills needed for reducing stress and depression.   Initial Review & Psychosocial Screening: Initial Psych Review & Screening - 12/14/16 1313      Initial Review   Current issues with  Current Sleep Concerns;Current Stress Concerns    Source of Stress Concerns  Chronic Illness      Family Dynamics   Good Support System?  Yes      Barriers   Psychosocial barriers to participate in program  The patient should benefit from training in stress management and relaxation.      Screening Interventions   Interventions  Encouraged to exercise;Yes;Program counselor consult    Expected Outcomes  Short Term goal: Utilizing psychosocial counselor, staff and physician to assist with identification of specific Stressors or current issues interfering with healing process. Setting desired goal  for each stressor or current issue identified.;Long Term Goal: Stressors or current issues are controlled or eliminated.;Short Term goal: Identification  and review with participant of any Quality of Life or Depression concerns found by scoring the questionnaire.;Long Term goal: The participant improves quality of Life and PHQ9 Scores as seen by post scores and/or verbalization of changes       Quality of Life Scores:  Quality of Life - 12/14/16 1319      Quality of Life Scores   GLOBAL Pre  17.18 %      Scores of 19 and below usually indicate a poorer quality of life in these areas.  A difference of  2-3 points is a clinically meaningful difference.  A difference of 2-3 points in the total score of the Quality of Life Index has been associated with significant improvement in overall quality of life, self-image, physical symptoms, and general health in studies assessing change in quality of life.  PHQ-9: Recent Review Flowsheet Data    Depression screen [facility] 2/9 01/24/2017 12/14/2016 12/14/2016   Decreased Interest 1 - 0   Down, Depressed, Hopeless 1 - 2   PHQ - 2 Score 2 - 2   Altered sleeping 0 - 3   Tired, decreased energy 1 - 2   Change in appetite 0 - 0   Feeling bad or failure about yourself  0 - 1   Trouble concentrating 0 - 3   Moving slowly or fidgety/restless 0 - 0   Suicidal thoughts 0 0  1   PHQ-9 Score 3 - 12   Difficult doing work/chores Not difficult at all - -     Interpretation of Total Score  Total Score Depression Severity:  1-4 = Minimal depression, 5-9 = Mild depression, 10-14 = Moderate depression, 15-19 = Moderately severe depression, 20-27 = Severe depression   Psychosocial Evaluation and Intervention: Psychosocial Evaluation - 01/22/17 0946      Psychosocial Evaluation & Interventions   Interventions  Encouraged to exercise with the program and follow exercise prescription;Stress management education    Comments  Counselor met with Mr. Lia Ronald Wallace) today for initial psychosocial evaluation.  He is a 77 year old who was recently diagnosed with stable angina.  He has a strong support system with a spouse  of 30 years; a daughter and nephew who live locally and active involvement in his local church community.  Ronald Wallace is also a diabetic and has had some dermatological issues preventing him from being consistent in his attendance lately.  He reports sleeping "too much" with ~10 hours at night and sometimes over 2 hours during the day.  He reports having a good appetite and denies a history of depression.  But Ronald Wallace states he struggles with anxiety at times and was tearful discussing concerns for his grandchildren and somewhat estranged from his son who lives in [place].  He reports his mood is generally positive most of the time.  Ronald Wallace has multiple stressors with his health; concerns for his grandchildren and his relationship with his son.  He has goals to increase his stamina and strength and improve his flexibility and balance while in this program.  He would like his quality of life to improve overall as well.  Counselor encouraged Ronald Wallace to find ways to stay busy during the day since he reports being bored often during the winter months.  Counselor also mentioned to [place] to consider taking a train or bus to visit his son in [place] since he refuses  to drive or fly that far at this time.  He agreed to consider this option since he misses his son so much and was very tearful about this relationship.  Counselor and staff will follow with Ronald Wallace throughout the course of this program.      Expected Outcomes  Ronald Wallace will exercise consistently to achieve his stated goals.  He will consider positive ways to improve his quality of life as suggested.  The educational and psychoeducational components of this program will be beneficial for Ronald Wallace to learn more about his condition and positive ways to cope in general.      Continue Psychosocial Services   Follow up required by staff       Psychosocial Re-Evaluation: Psychosocial Re-Evaluation    [place] Name 01/31/17 0855             Psychosocial Re-Evaluation    Current issues with  Current Stress Concerns       Comments  Ronald Wallace has been doing well in rehab.  He remains postive overall.  He and his son are doing better and he's doing better too.  His son is out in [place] skiing, but called him on Sunday.  He continues to sleep well.  He has found that exercise helps him feel better and he is not as sore any more.        Expected Outcomes  Short: Continue to attend rehab and reduce soreness.  Long: Continue to maintain positive attitude.           Psychosocial Discharge (Final Psychosocial Re-Evaluation): Psychosocial Re-Evaluation - 01/31/17 0855      Psychosocial Re-Evaluation   Current issues with  Current Stress Concerns    Comments  Ronald Wallace has been doing well in rehab.  He remains postive overall.  He and his son are doing better and he's doing better too.  His son is out in [place] skiing, but called him on Sunday.  He continues to sleep well.  He has found that exercise helps him feel better and he is not as sore any more.     Expected Outcomes  Short: Continue to attend rehab and reduce soreness.  Long: Continue to maintain positive attitude.        Vocational Rehabilitation: Provide vocational rehab assistance to qualifying candidates.   Vocational Rehab Evaluation & Intervention:   Education: Education Goals: Education classes will be provided on a variety of topics geared toward better understanding of heart health and risk factor modification. Participant will state understanding/return demonstration of topics presented as noted by education test scores.  Learning Barriers/Preferences:   Education Topics:  AED/CPR: - Group verbal and written instruction with the use of models to demonstrate the basic use of the AED with the basic ABC's of resuscitation.   General Nutrition Guidelines/Fats and Fiber: -Group instruction provided by verbal, written material, models and posters to present the general guidelines for heart healthy  nutrition. Gives an explanation and review of dietary fats and fiber.   Controlling Sodium/Reading Food Labels: -Group verbal and written material supporting the discussion of sodium use in heart healthy nutrition. Review and explanation with models, verbal and written materials for utilization of the food label.   Exercise Physiology & General Exercise Guidelines: - Group verbal and written instruction with models to review the exercise physiology of the cardiovascular system and associated critical values. Provides general exercise guidelines with specific guidelines to those with heart or lung disease.    Aerobic Exercise & Resistance Training: -  Gives group verbal and written instruction on the various components of exercise. Focuses on aerobic and resistive training programs and the benefits of this training and how to safely progress through these programs..   Flexibility, Balance, Mind/Body Relaxation: Provides group verbal/written instruction on the benefits of flexibility and balance training, including mind/body exercise modes such as yoga, pilates and tai chi.  Demonstration and skill practice provided.   Cardiac Rehab from 01/31/2017 in [facility] Cardiac and Pulmonary Rehab  Date  01/22/17  Educator  [facility]  Instruction Review Code  1- Verbalizes Understanding      Stress and Anxiety: - Provides group verbal and written instruction about the health risks of elevated stress and causes of high stress.  Discuss the correlation between heart/lung disease and anxiety and treatment options. Review healthy ways to manage with stress and anxiety.   Depression: - Provides group verbal and written instruction on the correlation between heart/lung disease and depressed mood, treatment options, and the stigmas associated with seeking treatment.   Anatomy & Physiology of the Heart: - Group verbal and written instruction and models provide basic cardiac anatomy and physiology, with the coronary  electrical and arterial systems. Review of Valvular disease and Heart Failure   Cardiac Procedures: - Group verbal and written instruction to review commonly prescribed medications for heart disease. Reviews the medication, class of the drug, and side effects. Includes the steps to properly store meds and maintain the prescription regimen. (beta blockers and nitrates)   Cardiac Medications I: - Group verbal and written instruction to review commonly prescribed medications for heart disease. Reviews the medication, class of the drug, and side effects. Includes the steps to properly store meds and maintain the prescription regimen.   Cardiac Rehab from 01/31/2017 in [facility] Cardiac and Pulmonary Rehab  Date  01/29/17  Educator  [place]  Instruction Review Code  1- Verbalizes Understanding      Cardiac Medications II: -Group verbal and written instruction to review commonly prescribed medications for heart disease. Reviews the medication, class of the drug, and side effects. (all other drug classes)    Go Sex-Intimacy & Heart Disease, Get SMART - Goal Setting: - Group verbal and written instruction through game format to discuss heart disease and the return to sexual intimacy. Provides group verbal and written material to discuss and apply goal setting through the application of the S.M.A.R.T. Method.   Other Matters of the Heart: - Provides group verbal, written materials and models to describe Stable Angina and Peripheral Artery. Includes description of the disease process and treatment options available to the cardiac patient.   Exercise & Equipment Safety: - Individual verbal instruction and demonstration of equipment use and safety with use of the equipment.   Cardiac Rehab from 01/31/2017 in [facility] Cardiac and Pulmonary Rehab  Date  01/22/17  Educator  [facility]  Instruction Review Code  1- Verbalizes Understanding      Infection Prevention: - Provides verbal and written material to individual  with discussion of infection control including proper hand washing and proper equipment cleaning during exercise session.   Cardiac Rehab from 01/31/2017 in [facility] Cardiac and Pulmonary Rehab  Date  01/22/17  Educator  [facility]  Instruction Review Code  1- Verbalizes Understanding      Falls Prevention: - Provides verbal and written material to individual with discussion of falls prevention and safety.   Diabetes: - Individual verbal and written instruction to review signs/symptoms of diabetes, desired ranges of glucose level fasting, after meals and with  exercise. Acknowledge that pre and post exercise glucose checks will be done for 3 sessions at entry of program.   Know Your Numbers and Risk Factors: -Group verbal and written instruction about important numbers in your health.  Discussion of what are risk factors and how they play a role in the disease process.  Review of Cholesterol, Blood Pressure, Diabetes, and BMI and the role they play in your overall health.   Sleep Hygiene: -Provides group verbal and written instruction about how sleep can affect your health.  Define sleep hygiene, discuss sleep cycles and impact of sleep habits. Review good sleep hygiene tips.    Other: -Provides group and verbal instruction on various topics (see comments)   Cardiac Rehab from 01/31/2017 in [facility] Cardiac and Pulmonary Rehab  Date  01/24/17  Educator  [facility]  Instruction Review Code  1- Verbalizes Understanding [Risk Factors and Know Your Numbers]      Knowledge Questionnaire Score:   Core Components/Risk Factors/Patient Goals at Admission: Personal Goals and Risk Factors at Admission - 12/14/16 1507      Core Components/Risk Factors/Patient Goals on Admission    Weight Management  Yes    Intervention  Weight Management: Develop a combined nutrition and exercise program designed to reach desired caloric intake, while maintaining appropriate intake of nutrient and fiber, sodium and fats, and  appropriate energy expenditure required for the weight goal.;Weight Management/Obesity: Establish reasonable short term and long term weight goals.;Obesity: Provide education and appropriate resources to help participant work on and attain dietary goals.    Admit Weight  198 lb 1.6 oz (89.9 kg)    Goal Weight: Short Term  190 lb (86.2 kg)       Core Components/Risk Factors/Patient Goals Review:  Goals and Risk Factor Review    Row Name 01/31/17 0847             Core Components/Risk Factors/Patient Goals Review   Personal Goals Review  Weight Management/Obesity;Hypertension;Diabetes;Lipids;Heart Failure       Review  Ronald Wallace has been doing well in rehab.  He noticed that his weight was up a little today and admitted to eating more since his  wife was out.  He weighs daily to monitor his heart failure and has not had any symptoms.  He also monitors his salt intake.  His blood sugars have been pretty good on average. He has been watching what he eats.  He has noticed that his sugars are better when he exercises.  His wife is also diabetic and they work together to monitor their sugars.  His blood pressures have been pretty good, he checks them about  every other day.  He feels that his medications are working for him.        Expected Outcomes  Short: Continue to work on weight loss.  Long: Contiue to manage diabetes.           Core Components/Risk Factors/Patient Goals at Discharge (Final Review):  Goals and Risk Factor Review - 01/31/17 0847      Core Components/Risk Factors/Patient Goals Review   Personal Goals Review  Weight Management/Obesity;Hypertension;Diabetes;Lipids;Heart Failure    Review  Ronald Wallace has been doing well in rehab.  He noticed that his weight was up a little today and admitted to eating more since his  wife was out.  He weighs daily to monitor his heart failure and has not had any symptoms.  He also monitors his salt intake.  His blood sugars have been pretty good on  average. He has been watching what he eats.  He has noticed that his sugars are better when he exercises.  His wife is also diabetic and they work together to monitor their sugars.  His blood pressures have been pretty good, he checks them about  every other day.  He feels that his medications are working for him.     Expected Outcomes  Short: Continue to work on weight loss.  Long: Contiue to manage diabetes.        ITP Comments: ITP Comments    Row Name 12/14/16 1321 12/14/16 1324 12/20/16 0545 01/10/17 1510 01/17/17 0630   ITP Comments  ITP created during Medical Review/Orientation appt after Cardiac REhab informed consent was signed.   Stable angina note documented in Dr. Donivan Scull 11/27/2016 note  30 day review. Continue with ITP unless directed changes per Medical Director review.   After some dermatology surgery, Ronald Wallace's doctor requested that he stay out until after the new year.   30 day review. Continue with ITP unless directed changes per Medical Director review.   1 visit since last review   Row Name 02/08/17 1123 02/14/17 0557 02/20/17 1519 03/05/17 1551 03/14/17 0642   ITP Comments  Mrs. Taubman called to let us know that Ronald Wallace has been out with shingles in his ear.  We are going to give a couple weeks to see if he gets any better.   30 Day review. Continue with ITP unless directed changes per Medical Director review.   Called to check on Ronald Wallace with his Shingles. He is still taking his medications and has some open wounds.  Talked about making sure he finishes his medications and all wounds are sealed before he is able to return to rehab.   Called to check on status of return.  He continues to have pain from Shingles.  The rash has improved per office notes but he says the pain is still in his back.  We will continue to touch base with him about his progress.  30 day review completed. Continue with ITP unless diercted changes per Medical Director. Remains out this month with medical reason       Comments:

## 2017-03-21 ENCOUNTER — Encounter: Payer: Self-pay | Admitting: *Deleted

## 2017-03-21 ENCOUNTER — Telehealth: Payer: Self-pay | Admitting: *Deleted

## 2017-03-21 DIAGNOSIS — I208 Other forms of angina pectoris: Secondary | ICD-10-CM

## 2017-03-21 NOTE — Telephone Encounter (Signed)
Called to check on pt status to return to rehab.  Spoke with his wife.  His wounds have all dried up and scabbed over.  He hopes to return on Monday.

## 2017-04-02 ENCOUNTER — Telehealth: Payer: Self-pay | Admitting: *Deleted

## 2017-04-02 ENCOUNTER — Encounter: Payer: Self-pay | Admitting: *Deleted

## 2017-04-02 DIAGNOSIS — I208 Other forms of angina pectoris: Secondary | ICD-10-CM

## 2017-04-02 NOTE — Telephone Encounter (Signed)
Ronald Wallace called this morning with questions about whether or not he should return.  He has finally recovered from his shingles, but now is experiencing a lot of pain.  We are going to discharge him at this time as he has been out for almost two months and have him return under a new order and new walk test to finish the program.

## 2017-04-02 NOTE — Progress Notes (Signed)
Cardiac Individual Treatment Plan  Patient Details  Name: Ronald Wallace MRN: [id number] Date of Birth: 1940-08-09 Referring Provider:     Cardiac Rehab from 12/14/2016 in [facility] Cardiac and Pulmonary Rehab  Referring Provider  [name]      Initial Encounter Date:    Cardiac Rehab from 12/14/2016 in [facility] Cardiac and Pulmonary Rehab  Date  12/14/16  Referring Provider  Rockey Situ      Visit Diagnosis: Stable angina ([place])  Patient's Home Medications on Admission:  Current Outpatient Medications:  .  aspirin 81 MG tablet, Take 81 mg by mouth daily., Disp: , Rfl:  .  calcium carbonate (OS-CAL) 600 MG TABS tablet, Take 600 mg by mouth daily with breakfast., Disp: , Rfl:  .  cholecalciferol (VITAMIN D) 1000 UNITS tablet, Take 1,000 Units by mouth daily., Disp: , Rfl:  .  Ferrous Sulfate (IRON) 325 (65 FE) MG TABS, Take 1 tablet by mouth daily. , Disp: , Rfl:  .  furosemide (LASIX) 20 MG tablet, Take 1 tablet (20 mg total) by mouth as needed for fluid (for shortness of breath)., Disp: 30 tablet, Rfl: 6 .  gabapentin (NEURONTIN) 300 MG capsule, Take 600 mg by mouth at bedtime., Disp: , Rfl:  .  hydrochlorothiazide (MICROZIDE) 12.5 MG capsule, Take 12.5 mg by mouth daily. , Disp: , Rfl:  .  isosorbide mononitrate (IMDUR) 30 MG 24 hr tablet, Take 1 tablet (30 mg total) by mouth daily., Disp: 90 tablet, Rfl: 1 .  LANTUS 100 UNIT/ML injection, Inject 42 Units into the skin daily. Take at bedtime, Disp: , Rfl:  .  lisinopril (PRINIVIL,ZESTRIL) 20 MG tablet, Take 20 mg by mouth daily., Disp: , Rfl:  .  metFORMIN (GLUCOPHAGE) 500 MG tablet, Take 250 mg by mouth 2 (two) times daily with a meal., Disp: , Rfl:  .  NOVOLOG 100 UNIT/ML injection, Inject into the skin 3 (three) times daily with meals. 12 units am, 12 units lunch, 14 units supper, Disp: , Rfl:  .  simvastatin (ZOCOR) 20 MG tablet, Take 20 mg by mouth daily., Disp: , Rfl:  .  tamsulosin (FLOMAX) 0.4 MG CAPS capsule, Take 0.4 mg by mouth  daily., Disp: , Rfl:   Past Medical History: Past Medical History:  Diagnosis Date  . Anemia   . CAD (coronary artery disease)    a. 02/2014 Neg Ex MV, EF 65%;  b. 06/2015 Abnl MV ([facility])-->Cath ([facility]): LM nl, LAD 30p/m, LCX 30p, OM2 50p, RCA 100p w/ L-->R collats, nl EF-->Med Rx.  . Diabetes mellitus, type II, insulin dependent ([facility])    With neuropathy  . Diverticulitis   . Essential hypertension   . Hyperlipidemia   . Neuropathy   . Shortness of breath dyspnea   . Syncope and collapse    a. 02/2014 Carotid Dopplers: 1-39% bilateral ICA with normal Subclavian & vertebral arteries; b. 02/2014 2 D Echo: EF 50-55%, Gr 1 DD, Mild MR & LA dilation -- was bradycardic in 40s.    Tobacco Use: Social History   Tobacco Use  Smoking Status Never Smoker  Smokeless Tobacco Never Used    Labs: Recent Review Flowsheet Data    There is no flowsheet data to display.       Exercise Target Goals:    Exercise Program Goal: Individual exercise prescription set using results from initial 6 min walk test and THRR while considering  patient's activity barriers and safety.   Exercise Prescription Goal: Initial exercise prescription builds to 30-45 minutes a  day of aerobic activity, 2-3 days per week.  Home exercise guidelines will be given to patient during program as part of exercise prescription that the participant will acknowledge.  Activity Barriers & Risk Stratification: Activity Barriers & Cardiac Risk Stratification - 12/14/16 1319      Activity Barriers & Cardiac Risk Stratification   Activity Barriers  Arthritis;Chest Pain/Angina;Neck/Spine Problems    Cardiac Risk Stratification  High       6 Minute Walk: 6 Minute Walk    Row Name 12/14/16 1501         6 Minute Walk   Distance  1488 feet     Walk Time  6 minutes     # of Rest Breaks  0     MPH  2.82     METS  3.25     RPE  9     Perceived Dyspnea   3     VO2 Peak  11.36     Symptoms  No     Resting HR  63 bpm      Resting BP  132/64     Resting Oxygen Saturation   96 %     Exercise Oxygen Saturation  during 6 min walk  95 %     Max Ex. HR  129 bpm     Max Ex. BP  162/64     2 Minute Post BP  130/60        Oxygen Initial Assessment:   Oxygen Re-Evaluation:   Oxygen Discharge (Final Oxygen Re-Evaluation):   Initial Exercise Prescription: Initial Exercise Prescription - 12/14/16 1500      Date of Initial Exercise RX and Referring Provider   Date  12/14/16    Referring Provider  [name]      Treadmill   MPH  2.7    Grade  0.5    Minutes  15    METs  3.25      NuStep   Level  3    SPM  80    Minutes  15    METs  3.2      REL-XR   Level  2    Speed  50    Minutes  15    METs  3.25      Prescription Details   Frequency (times per week)  3    Duration  Progress to 45 minutes of aerobic exercise without signs/symptoms of physical distress      Intensity   THRR 40-80% of Max Heartrate  96-129    Ratings of Perceived Exertion  11-13    Perceived Dyspnea  0-4      Resistance Training   Training Prescription  Yes    Weight  3 lb    Reps  10-15       Perform Capillary Blood Glucose checks as needed.  Exercise Prescription Changes: Exercise Prescription Changes    Row Name 12/26/16 1300 01/24/17 1500 01/31/17 0900 02/05/17 1500       Response to Exercise   Blood Pressure (Admit)  134/70  132/64  -  122/60    Blood Pressure (Exercise)  134/60  144/52  -  126/74    Blood Pressure (Exit)  126/54  166/80  -  126/70    Heart Rate (Admit)  73 bpm  60 bpm  -  65 bpm    Heart Rate (Exercise)  105 bpm  110 bpm  -  107 bpm    Heart Rate (Exit)  68 bpm  70 bpm  -  62 bpm    Rating of Perceived Exertion (Exercise)  13  14  -  12    Symptoms  none  none  -  none    Comments  first full day of exercise  third full day of exercise  -  -    Duration  Progress to 45 minutes of aerobic exercise without signs/symptoms of physical distress  Progress to 45 minutes of aerobic  exercise without signs/symptoms of physical distress  -  Continue with 45 min of aerobic exercise without signs/symptoms of physical distress.    Intensity  THRR unchanged  THRR unchanged  -  THRR unchanged      Progression   Progression  Continue to progress workloads to maintain intensity without signs/symptoms of physical distress.  Continue to progress workloads to maintain intensity without signs/symptoms of physical distress.  -  Continue to progress workloads to maintain intensity without signs/symptoms of physical distress.    Average METs  3.22  2.98  -  3.42      Resistance Training   Training Prescription  Yes  Yes  -  Yes    Weight  3 lb  3 lb  -  3 lb    Reps  10-15  10-15  -  10-15      Interval Training   Interval Training  No  No  -  No      Treadmill   MPH  2.7  2.7  -  2.9    Grade  0.5  0.5  -  1    Minutes  15  15  -  15    METs  3.25  3.25  -  3.62      NuStep   Level  3  3  -  3    Minutes  15  15  -  15    METs  3  4  -  3.4      REL-XR   Level  2  2  -  2    Minutes  15  15  -  15    METs  3.4  1.7  -  3.4      Home Exercise Plan   Plans to continue exercise at  -  -  [employer] (comment) walking, treadmill, [employer] (comment) walking, treadmill, [employer]    Frequency  -  -  Add 2 additional days to program exercise sessions.  Add 2 additional days to program exercise sessions.    Initial Home Exercises Provided  -  -  01/31/17  01/31/17       Exercise Comments: Exercise Comments    Row Name 12/22/16 [phone]           Exercise Comments  First full day of exercise!  Patient was oriented to gym and equipment including functions, settings, policies, and procedures.  Patient's individual exercise prescription and treatment plan were reviewed.  All starting workloads were established based on the results of the 6 minute walk test done at initial orientation visit.  The plan for exercise progression was also introduced and progression  will be customized based on patient's performance and goals.          Exercise Goals and Review: Exercise Goals    Row Name 12/14/16 1500             Exercise Goals   Increase Physical Activity  Yes  Intervention  Provide advice, education, support and counseling about physical activity/exercise needs.;Develop an individualized exercise prescription for aerobic and resistive training based on initial evaluation findings, risk stratification, comorbidities and participant's personal goals.       Expected Outcomes  Achievement of increased cardiorespiratory fitness and enhanced flexibility, muscular endurance and strength shown through measurements of functional capacity and personal statement of participant.       Increase Strength and Stamina  Yes       Intervention  Provide advice, education, support and counseling about physical activity/exercise needs.;Develop an individualized exercise prescription for aerobic and resistive training based on initial evaluation findings, risk stratification, comorbidities and participant's personal goals.       Expected Outcomes  Achievement of increased cardiorespiratory fitness and enhanced flexibility, muscular endurance and strength shown through measurements of functional capacity and personal statement of participant.       Able to understand and use rate of perceived exertion (RPE) scale  Yes       Intervention  Provide education and explanation on how to use RPE scale       Expected Outcomes  Short Term: Able to use RPE daily in rehab to express subjective intensity level;Long Term:  Able to use RPE to guide intensity level when exercising independently       Able to understand and use Dyspnea scale  Yes       Intervention  Provide education and explanation on how to use Dyspnea scale       Expected Outcomes  Short Term: Able to use Dyspnea scale daily in rehab to express subjective sense of shortness of breath during exertion;Long Term: Able  to use Dyspnea scale to guide intensity level when exercising independently       Knowledge and understanding of Target Heart Rate Range (THRR)  Yes       Intervention  Provide education and explanation of THRR including how the numbers were predicted and where they are located for reference       Expected Outcomes  Short Term: Able to state/look up THRR;Long Term: Able to use THRR to govern intensity when exercising independently;Short Term: Able to use daily as guideline for intensity in rehab       Able to check pulse independently  Yes       Intervention  Provide education and demonstration on how to check pulse in carotid and radial arteries.;Review the importance of being able to check your own pulse for safety during independent exercise       Expected Outcomes  Short Term: Able to explain why pulse checking is important during independent exercise;Long Term: Able to check pulse independently and accurately       Understanding of Exercise Prescription  Yes       Intervention  Provide education, explanation, and written materials on patient's individual exercise prescription       Expected Outcomes  Short Term: Able to explain program exercise prescription;Long Term: Able to explain home exercise prescription to exercise independently          Exercise Goals Re-Evaluation : Exercise Goals Re-Evaluation    Row Name 12/22/16 5462 01/10/17 1511 01/24/17 1528 01/31/17 0847 02/05/17 1537     Exercise Goal Re-Evaluation   Exercise Goals Review  Understanding of Exercise Prescription;Knowledge and understanding of Target Heart Rate Range (THRR);Able to understand and use rate of perceived exertion (RPE) scale  -  Increase Physical Activity;Increase Strength and Stamina;Understanding of Exercise Prescription  Increase Physical Activity;Increase Strength  and Stamina;Understanding of Exercise Prescription  Increase Physical Activity;Increase Strength and Stamina;Understanding of Exercise Prescription     Comments  Reviewed RPE scale, THR and program prescription with pt today.  Pt voiced understanding and was given a copy of goals to take home.   After some dermatology surgery, [name]'s doctor requested that he stay out until after the new year.   Ronald Wallace is off to a good start in rehab.  He has had offically three full days of exercise due to his surgery.  He was able to return at his current workloads without a problem.  We will start to increase his workloads and continue to monitor his progress.   Ronald Wallace is doing well in rehab. He is feeling stronger and has more stamina.   Currently, he has been walking some at home.  Reviewed home exercise with pt today.  Pt plans to walking, treadmill, and returning to [facility]  for exercise.  Reviewed THR, pulse, RPE, sign and symptoms, and when to call 911 or MD.  Also discussed weather considerations and indoor options.  Pt voiced understanding.  Ronald Wallace has been doing well in rehab.  He is now up to 3.4 METs on both the XR and NuStep.  We will continue to monitor his progression.    Expected Outcomes  Short: Use RPE daily to regulate intensity.  Long: Follow program prescription in THR.  -  Short: Increase incline on treadmill and review home exercise.  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   Short: Return to [facility] for exercise.  Long: Continue to increase strength and stamina.   Short: Increase workloads on XR and NuStep.  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    [place] Name 02/20/17 1519 03/21/17 1439           Exercise Goal Re-Evaluation   Comments  Out since last review.  Out since last review.         Discharge Exercise Prescription (Final Exercise Prescription Changes): Exercise Prescription Changes - 02/05/17 1500      Response to Exercise   Blood Pressure (Admit)  122/60    Blood Pressure (Exercise)  126/74    Blood Pressure (Exit)  126/70    Heart Rate (Admit)  65 bpm    Heart Rate (Exercise)  107  bpm    Heart Rate (Exit)  62 bpm    Rating of Perceived Exertion (Exercise)  12    Symptoms  none    Duration  Continue with 45 min of aerobic exercise without signs/symptoms of physical distress.    Intensity  THRR unchanged      Progression   Progression  Continue to progress workloads to maintain intensity without signs/symptoms of physical distress.    Average METs  3.42      Resistance Training   Training Prescription  Yes    Weight  3 lb    Reps  10-15      Interval Training   Interval Training  No      Treadmill   MPH  2.9    Grade  1    Minutes  15    METs  3.62      NuStep   Level  3    Minutes  15    METs  3.4      REL-XR   Level  2    Minutes  15    METs  3.4  Home Exercise Plan   Plans to continue exercise at  [facility] (comment) walking, treadmill, [employer]    Frequency  Add 2 additional days to program exercise sessions.    Initial Home Exercises Provided  01/31/17       Nutrition:  Target Goals: Understanding of nutrition guidelines, daily intake of sodium <1576m, cholesterol <2054m calories 30% from fat and 7% or less from saturated fats, daily to have 5 or more servings of fruits and vegetables.  Biometrics: Pre Biometrics - 12/14/16 1459      Pre Biometrics   Height  5' 8.5" (1.74 m)    Weight  198 lb 1.6 oz (89.9 kg)    Waist Circumference  41.5 inches    Hip Circumference  40 inches    Waist to Hip Ratio  1.04 %    BMI (Calculated)  29.68    Single Leg Stand  20.27 seconds        Nutrition Therapy Plan and Nutrition Goals: Nutrition Therapy & Goals - 12/14/16 1320      Nutrition Therapy   RD appointment deferred  Yes    Drug/Food Interactions  Statins/Certain Fruits       Nutrition Assessments: Nutrition Assessments - 12/14/16 1320      MEDFICTS Scores   Pre Score  20       Nutrition Goals Re-Evaluation: Nutrition Goals Re-Evaluation    Row Name 01/31/17 0851             Goals   Nutrition Goal  Meet  with nutritionist on 1/30 during class       Comment  HaJoneen Boersould like to meet with nutrtionist during class as lives is [employer] He would like to just hear some tips for weight loss and how to cut back on his snacking.        Expected Outcome  Short: Meet with dietician  Long: Cut back on snacking.           Nutrition Goals Discharge (Final Nutrition Goals Re-Evaluation): Nutrition Goals Re-Evaluation - 01/31/17 0851      Goals   Nutrition Goal  Meet with nutritionist on 1/30 during class    Comment  HaJoneen Boersould like to meet with nutrtionist during class as lives is [employer] He would like to just hear some tips for weight loss and how to cut back on his snacking.     Expected Outcome  Short: Meet with dietician  Long: Cut back on snacking.        Psychosocial: Target Goals: Acknowledge presence or absence of significant depression and/or stress, maximize coping skills, provide positive support system. Participant is able to verbalize types and ability to use techniques and skills needed for reducing stress and depression.   Initial Review & Psychosocial Screening: Initial Psych Review & Screening - 12/14/16 1313      Initial Review   Current issues with  Current Sleep Concerns;Current Stress Concerns    Source of Stress Concerns  Chronic Illness      Family Dynamics   Good Support System?  Yes      Barriers   Psychosocial barriers to participate in program  The patient should benefit from training in stress management and relaxation.      Screening Interventions   Interventions  Encouraged to exercise;Yes;Program counselor consult    Expected Outcomes  Short Term goal: Utilizing psychosocial counselor, staff and physician to assist with identification of specific Stressors or current issues interfering with  healing process. Setting desired goal for each stressor or current issue identified.;Long Term Goal: Stressors or current issues are controlled or  eliminated.;Short Term goal: Identification and review with participant of any Quality of Life or Depression concerns found by scoring the questionnaire.;Long Term goal: The participant improves quality of Life and PHQ9 Scores as seen by post scores and/or verbalization of changes       Quality of Life Scores:  Quality of Life - 12/14/16 1319      Quality of Life Scores   GLOBAL Pre  17.18 %      Scores of 19 and below usually indicate a poorer quality of life in these areas.  A difference of  2-3 points is a clinically meaningful difference.  A difference of 2-3 points in the total score of the Quality of Life Index has been associated with significant improvement in overall quality of life, self-image, physical symptoms, and general health in studies assessing change in quality of life.  PHQ-9: Recent Review Flowsheet Data    Depression screen [facility] 2/9 01/24/2017 12/14/2016 12/14/2016   Decreased Interest 1 - 0   Down, Depressed, Hopeless 1 - 2   PHQ - 2 Score 2 - 2   Altered sleeping 0 - 3   Tired, decreased energy 1 - 2   Change in appetite 0 - 0   Feeling bad or failure about yourself  0 - 1   Trouble concentrating 0 - 3   Moving slowly or fidgety/restless 0 - 0   Suicidal thoughts 0 0  1   PHQ-9 Score 3 - 12   Difficult doing work/chores Not difficult at all - -     Interpretation of Total Score  Total Score Depression Severity:  1-4 = Minimal depression, 5-9 = Mild depression, 10-14 = Moderate depression, 15-19 = Moderately severe depression, 20-27 = Severe depression   Psychosocial Evaluation and Intervention: Psychosocial Evaluation - 01/22/17 0946      Psychosocial Evaluation & Interventions   Interventions  Encouraged to exercise with the program and follow exercise prescription;Stress management education    Comments  Counselor met with Mr. Hernan Ronald Wallace) today for initial psychosocial evaluation.  He is a 78 year old who was recently diagnosed with stable angina.  He  has a strong support system with a spouse of 60 years; a daughter and nephew who live locally and active involvement in his local church community.  Ronald Wallace is also a diabetic and has had some dermatological issues preventing him from being consistent in his attendance lately.  He reports sleeping "too much" with ~10 hours at night and sometimes over 2 hours during the day.  He reports having a good appetite and denies a history of depression.  But Ronald Wallace states he struggles with anxiety at times and was tearful discussing concerns for his grandchildren and somewhat estranged from his son who lives in [place].  He reports his mood is generally positive most of the time.  Ronald Wallace has multiple stressors with his health; concerns for his grandchildren and his relationship with his son.  He has goals to increase his stamina and strength and improve his flexibility and balance while in this program.  He would like his quality of life to improve overall as well.  Counselor encouraged Ronald Wallace to find ways to stay busy during the day since he reports being bored often during the winter months.  Counselor also mentioned to [place] to consider taking a train or bus to visit his son  in [place] since he refuses to drive or fly that far at this time.  He agreed to consider this option since he misses his son so much and was very tearful about this relationship.  Counselor and staff will follow with Ronald Wallace throughout the course of this program.      Expected Outcomes  Ronald Wallace will exercise consistently to achieve his stated goals.  He will consider positive ways to improve his quality of life as suggested.  The educational and psychoeducational components of this program will be beneficial for Ronald Wallace to learn more about his condition and positive ways to cope in general.      Continue Psychosocial Services   Follow up required by staff       Psychosocial Re-Evaluation: Psychosocial Re-Evaluation    [place] Name 01/31/17 0855              Psychosocial Re-Evaluation   Current issues with  Current Stress Concerns       Comments  Ronald Wallace has been doing well in rehab.  He remains postive overall.  He and his son are doing better and he's doing better too.  His son is out in [place] skiing, but called him on Sunday.  He continues to sleep well.  He has found that exercise helps him feel better and he is not as sore any more.        Expected Outcomes  Short: Continue to attend rehab and reduce soreness.  Long: Continue to maintain positive attitude.           Psychosocial Discharge (Final Psychosocial Re-Evaluation): Psychosocial Re-Evaluation - 01/31/17 0855      Psychosocial Re-Evaluation   Current issues with  Current Stress Concerns    Comments  Ronald Wallace has been doing well in rehab.  He remains postive overall.  He and his son are doing better and he's doing better too.  His son is out in [place] skiing, but called him on Sunday.  He continues to sleep well.  He has found that exercise helps him feel better and he is not as sore any more.     Expected Outcomes  Short: Continue to attend rehab and reduce soreness.  Long: Continue to maintain positive attitude.        Vocational Rehabilitation: Provide vocational rehab assistance to qualifying candidates.   Vocational Rehab Evaluation & Intervention:   Education: Education Goals: Education classes will be provided on a variety of topics geared toward better understanding of heart health and risk factor modification. Participant will state understanding/return demonstration of topics presented as noted by education test scores.  Learning Barriers/Preferences:   Education Topics:  AED/CPR: - Group verbal and written instruction with the use of models to demonstrate the basic use of the AED with the basic ABC's of resuscitation.   General Nutrition Guidelines/Fats and Fiber: -Group instruction provided by verbal, written material, models and posters to present  the general guidelines for heart healthy nutrition. Gives an explanation and review of dietary fats and fiber.   Controlling Sodium/Reading Food Labels: -Group verbal and written material supporting the discussion of sodium use in heart healthy nutrition. Review and explanation with models, verbal and written materials for utilization of the food label.   Exercise Physiology & General Exercise Guidelines: - Group verbal and written instruction with models to review the exercise physiology of the cardiovascular system and associated critical values. Provides general exercise guidelines with specific guidelines to those with heart or lung disease.    Aerobic  Exercise & Resistance Training: - Gives group verbal and written instruction on the various components of exercise. Focuses on aerobic and resistive training programs and the benefits of this training and how to safely progress through these programs..   Flexibility, Balance, Mind/Body Relaxation: Provides group verbal/written instruction on the benefits of flexibility and balance training, including mind/body exercise modes such as yoga, pilates and tai chi.  Demonstration and skill practice provided.   Cardiac Rehab from 01/31/2017 in [facility] Cardiac and Pulmonary Rehab  Date  01/22/17  Educator  [facility]  Instruction Review Code  1- Verbalizes Understanding      Stress and Anxiety: - Provides group verbal and written instruction about the health risks of elevated stress and causes of high stress.  Discuss the correlation between heart/lung disease and anxiety and treatment options. Review healthy ways to manage with stress and anxiety.   Depression: - Provides group verbal and written instruction on the correlation between heart/lung disease and depressed mood, treatment options, and the stigmas associated with seeking treatment.   Anatomy & Physiology of the Heart: - Group verbal and written instruction and models provide basic cardiac  anatomy and physiology, with the coronary electrical and arterial systems. Review of Valvular disease and Heart Failure   Cardiac Procedures: - Group verbal and written instruction to review commonly prescribed medications for heart disease. Reviews the medication, class of the drug, and side effects. Includes the steps to properly store meds and maintain the prescription regimen. (beta blockers and nitrates)   Cardiac Medications I: - Group verbal and written instruction to review commonly prescribed medications for heart disease. Reviews the medication, class of the drug, and side effects. Includes the steps to properly store meds and maintain the prescription regimen.   Cardiac Rehab from 01/31/2017 in [facility] Cardiac and Pulmonary Rehab  Date  01/29/17  Educator  [place]  Instruction Review Code  1- Verbalizes Understanding      Cardiac Medications II: -Group verbal and written instruction to review commonly prescribed medications for heart disease. Reviews the medication, class of the drug, and side effects. (all other drug classes)    Go Sex-Intimacy & Heart Disease, Get SMART - Goal Setting: - Group verbal and written instruction through game format to discuss heart disease and the return to sexual intimacy. Provides group verbal and written material to discuss and apply goal setting through the application of the S.M.A.R.T. Method.   Other Matters of the Heart: - Provides group verbal, written materials and models to describe Stable Angina and Peripheral Artery. Includes description of the disease process and treatment options available to the cardiac patient.   Exercise & Equipment Safety: - Individual verbal instruction and demonstration of equipment use and safety with use of the equipment.   Cardiac Rehab from 01/31/2017 in [facility] Cardiac and Pulmonary Rehab  Date  01/22/17  Educator  [facility]  Instruction Review Code  1- Verbalizes Understanding      Infection Prevention: - Provides  verbal and written material to individual with discussion of infection control including proper hand washing and proper equipment cleaning during exercise session.   Cardiac Rehab from 01/31/2017 in [facility] Cardiac and Pulmonary Rehab  Date  01/22/17  Educator  [facility]  Instruction Review Code  1- Verbalizes Understanding      Falls Prevention: - Provides verbal and written material to individual with discussion of falls prevention and safety.   Diabetes: - Individual verbal and written instruction to review signs/symptoms of diabetes, desired ranges of glucose level  fasting, after meals and with exercise. Acknowledge that pre and post exercise glucose checks will be done for 3 sessions at entry of program.   Know Your Numbers and Risk Factors: -Group verbal and written instruction about important numbers in your health.  Discussion of what are risk factors and how they play a role in the disease process.  Review of Cholesterol, Blood Pressure, Diabetes, and BMI and the role they play in your overall health.   Sleep Hygiene: -Provides group verbal and written instruction about how sleep can affect your health.  Define sleep hygiene, discuss sleep cycles and impact of sleep habits. Review good sleep hygiene tips.    Other: -Provides group and verbal instruction on various topics (see comments)   Cardiac Rehab from 01/31/2017 in [facility] Cardiac and Pulmonary Rehab  Date  01/24/17  Educator  [facility]  Instruction Review Code  1- Verbalizes Understanding [Risk Factors and Know Your Numbers]      Knowledge Questionnaire Score:   Core Components/Risk Factors/Patient Goals at Admission: Personal Goals and Risk Factors at Admission - 12/14/16 1507      Core Components/Risk Factors/Patient Goals on Admission    Weight Management  Yes    Intervention  Weight Management: Develop a combined nutrition and exercise program designed to reach desired caloric intake, while maintaining appropriate intake of  nutrient and fiber, sodium and fats, and appropriate energy expenditure required for the weight goal.;Weight Management/Obesity: Establish reasonable short term and long term weight goals.;Obesity: Provide education and appropriate resources to help participant work on and attain dietary goals.    Admit Weight  198 lb 1.6 oz (89.9 kg)    Goal Weight: Short Term  190 lb (86.2 kg)       Core Components/Risk Factors/Patient Goals Review:  Goals and Risk Factor Review    Row Name 01/31/17 0847             Core Components/Risk Factors/Patient Goals Review   Personal Goals Review  Weight Management/Obesity;Hypertension;Diabetes;Lipids;Heart Failure       Review  Ronald Wallace has been doing well in rehab.  He noticed that his weight was up a little today and admitted to eating more since his  wife was out.  He weighs daily to monitor his heart failure and has not had any symptoms.  He also monitors his salt intake.  His blood sugars have been pretty good on average. He has been watching what he eats.  He has noticed that his sugars are better when he exercises.  His wife is also diabetic and they work together to monitor their sugars.  His blood pressures have been pretty good, he checks them about  every other day.  He feels that his medications are working for him.        Expected Outcomes  Short: Continue to work on weight loss.  Long: Contiue to manage diabetes.           Core Components/Risk Factors/Patient Goals at Discharge (Final Review):  Goals and Risk Factor Review - 01/31/17 0847      Core Components/Risk Factors/Patient Goals Review   Personal Goals Review  Weight Management/Obesity;Hypertension;Diabetes;Lipids;Heart Failure    Review  Ronald Wallace has been doing well in rehab.  He noticed that his weight was up a little today and admitted to eating more since his  wife was out.  He weighs daily to monitor his heart failure and has not had any symptoms.  He also monitors his salt intake.  His  blood  sugars have been pretty good on average. He has been watching what he eats.  He has noticed that his sugars are better when he exercises.  His wife is also diabetic and they work together to monitor their sugars.  His blood pressures have been pretty good, he checks them about  every other day.  He feels that his medications are working for him.     Expected Outcomes  Short: Continue to work on weight loss.  Long: Contiue to manage diabetes.        ITP Comments: ITP Comments    Row Name 12/14/16 1321 12/14/16 1324 12/20/16 0545 01/10/17 1510 01/17/17 0630   ITP Comments  ITP created during Medical Review/Orientation appt after Cardiac REhab informed consent was signed.   Stable angina note documented in Dr. Donivan Scull 11/27/2016 note  30 day review. Continue with ITP unless directed changes per Medical Director review.   After some dermatology surgery, [name]'s doctor requested that he stay out until after the new year.   30 day review. Continue with ITP unless directed changes per Medical Director review.   1 visit since last review   Row Name 02/08/17 1123 02/14/17 0557 02/20/17 1519 03/05/17 1551 03/14/17 0642   ITP Comments  Mrs. Lembcke called to let us know that Ronald Wallace has been out with shingles in his ear.  We are going to give a couple weeks to see if he gets any better.   30 Day review. Continue with ITP unless directed changes per Medical Director review.   Called to check on Ronald Wallace with his Shingles. He is still taking his medications and has some open wounds.  Talked about making sure he finishes his medications and all wounds are sealed before he is able to return to rehab.   Called to check on status of return.  He continues to have pain from Shingles.  The rash has improved per office notes but he says the pain is still in his back.  We will continue to touch base with him about his progress.  30 day review completed. Continue with ITP unless diercted changes per Medical Director. Remains  out this month with medical reason   Row Name 03/21/17 1439 04/02/17 1500         ITP Comments  Called to check on pt status to return to rehab.  Spoke with his wife.  His wounds have all dried up and scabbed over.  He hopes to return on Monday.   Ronald Wallace called this morning with questions about whether or not he should return.  He has finally recovered from his shingles, but now is experiencing a lot of pain.  We are going to discharge him at this time as he has been out for almost two months and have him return under a new order and new walk test to finish the program.          Comments: Discharge ITP

## 2017-04-02 NOTE — Progress Notes (Signed)
Discharge Progress Report  Patient Details  Name: Ronald Wallace MRN: [id number] Date of Birth: [birth year] Referring Provider:     Cardiac Rehab from 12/14/2016 in [facility] Cardiac and Pulmonary Rehab  Referring Provider  [name]       Number of Visits: 12/36  Reason for Discharge:  Early Exit:  Personal, Lack of attendance and Shingles  Smoking History:  Social History   Tobacco Use  Smoking Status Never Smoker  Smokeless Tobacco Never Used    Diagnosis:  Stable angina ([place])  ADL [facility]:   Initial Exercise Prescription: Initial Exercise Prescription - 12/14/16 1500      Date of Initial Exercise RX and Referring Provider   Date  12/14/16    Referring Provider  [name]      Treadmill   MPH  2.7    Grade  0.5    Minutes  15    METs  3.25      NuStep   Level  3    SPM  80    Minutes  15    METs  3.2      REL-XR   Level  2    Speed  50    Minutes  15    METs  3.25      Prescription Details   Frequency (times per week)  3    Duration  Progress to 45 minutes of aerobic exercise without signs/symptoms of physical distress      Intensity   THRR 40-80% of Max Heartrate  96-129    Ratings of Perceived Exertion  11-13    Perceived Dyspnea  0-4      Resistance Training   Training Prescription  Yes    Weight  3 lb    Reps  10-15       Discharge Exercise Prescription (Final Exercise Prescription Changes): Exercise Prescription Changes - 02/05/17 1500      Response to Exercise   Blood Pressure (Admit)  122/60    Blood Pressure (Exercise)  126/74    Blood Pressure (Exit)  126/70    Heart Rate (Admit)  65 bpm    Heart Rate (Exercise)  107 bpm    Heart Rate (Exit)  62 bpm    Rating of Perceived Exertion (Exercise)  12    Symptoms  none    Duration  Continue with 45 min of aerobic exercise without signs/symptoms of physical distress.    Intensity  THRR unchanged      Progression   Progression  Continue to progress workloads to maintain intensity without  signs/symptoms of physical distress.    Average METs  3.42      Resistance Training   Training Prescription  Yes    Weight  3 lb    Reps  10-15      Interval Training   Interval Training  No      Treadmill   MPH  2.9    Grade  1    Minutes  15    METs  3.62      NuStep   Level  3    Minutes  15    METs  3.4      REL-XR   Level  2    Minutes  15    METs  3.4      Home Exercise Plan   Plans to continue exercise at  [employer] (comment) walking, treadmill, [employer]    Frequency  Add 2 additional days to program exercise  sessions.    Initial Home Exercises Provided  01/31/17       Functional Capacity: 6 Minute Walk    Row Name 12/14/16 1501         6 Minute Walk   Distance  1488 feet     Walk Time  6 minutes     # of Rest Breaks  0     MPH  2.82     METS  3.25     RPE  9     Perceived Dyspnea   3     VO2 Peak  11.36     Symptoms  No     Resting HR  63 bpm     Resting BP  132/64     Resting Oxygen Saturation   96 %     Exercise Oxygen Saturation  during 6 min walk  95 %     Max Ex. HR  129 bpm     Max Ex. BP  162/64     2 Minute Post BP  130/60        Psychological, QOL, Others - Outcomes: PHQ 2/9: Depression screen [facility] 2/9 01/24/2017 12/14/2016 12/14/2016  Decreased Interest 1 - 0  Down, Depressed, Hopeless 1 - 2  PHQ - 2 Score 2 - 2  Altered sleeping 0 - 3  Tired, decreased energy 1 - 2  Change in appetite 0 - 0  Feeling bad or failure about yourself  0 - 1  Trouble concentrating 0 - 3  Moving slowly or fidgety/restless 0 - 0  Suicidal thoughts 0 0 1  PHQ-9 Score 3 - 12  Difficult doing work/chores Not difficult at all - -    Quality of Life: Quality of Life - 12/14/16 1319      Quality of Life Scores   GLOBAL Pre  17.18 %       Personal Goals: Goals established at orientation with interventions provided to work toward goal. Personal Goals and Risk Factors at Admission - 12/14/16 1507      Core Components/Risk Factors/Patient  Goals on Admission    Weight Management  Yes    Intervention  Weight Management: Develop a combined nutrition and exercise program designed to reach desired caloric intake, while maintaining appropriate intake of nutrient and fiber, sodium and fats, and appropriate energy expenditure required for the weight goal.;Weight Management/Obesity: Establish reasonable short term and long term weight goals.;Obesity: Provide education and appropriate resources to help participant work on and attain dietary goals.    Admit Weight  198 lb 1.6 oz (89.9 kg)    Goal Weight: Short Term  190 lb (86.2 kg)        Personal Goals Discharge: Goals and Risk Factor Review    Row Name 01/31/17 0847             Core Components/Risk Factors/Patient Goals Review   Personal Goals Review  Weight Management/Obesity;Hypertension;Diabetes;Lipids;Heart Failure       Review  Ronald Wallace has been doing well in rehab.  He noticed that his weight was up a little today and admitted to eating more since his  wife was out.  He weighs daily to monitor his heart failure and has not had any symptoms.  He also monitors his salt intake.  His blood sugars have been pretty good on average. He has been watching what he eats.  He has noticed that his sugars are better when he exercises.  His wife is also diabetic and they work together  to monitor their sugars.  His blood pressures have been pretty good, he checks them about  every other day.  He feels that his medications are working for him.        Expected Outcomes  Short: Continue to work on weight loss.  Long: Contiue to manage diabetes.           Exercise Goals and Review: Exercise Goals    Row Name 12/14/16 1500             Exercise Goals   Increase Physical Activity  Yes       Intervention  Provide advice, education, support and counseling about physical activity/exercise needs.;Develop an individualized exercise prescription for aerobic and resistive training based on initial  evaluation findings, risk stratification, comorbidities and participant's personal goals.       Expected Outcomes  Achievement of increased cardiorespiratory fitness and enhanced flexibility, muscular endurance and strength shown through measurements of functional capacity and personal statement of participant.       Increase Strength and Stamina  Yes       Intervention  Provide advice, education, support and counseling about physical activity/exercise needs.;Develop an individualized exercise prescription for aerobic and resistive training based on initial evaluation findings, risk stratification, comorbidities and participant's personal goals.       Expected Outcomes  Achievement of increased cardiorespiratory fitness and enhanced flexibility, muscular endurance and strength shown through measurements of functional capacity and personal statement of participant.       Able to understand and use rate of perceived exertion (RPE) scale  Yes       Intervention  Provide education and explanation on how to use RPE scale       Expected Outcomes  Short Term: Able to use RPE daily in rehab to express subjective intensity level;Long Term:  Able to use RPE to guide intensity level when exercising independently       Able to understand and use Dyspnea scale  Yes       Intervention  Provide education and explanation on how to use Dyspnea scale       Expected Outcomes  Short Term: Able to use Dyspnea scale daily in rehab to express subjective sense of shortness of breath during exertion;Long Term: Able to use Dyspnea scale to guide intensity level when exercising independently       Knowledge and understanding of Target Heart Rate Range (THRR)  Yes       Intervention  Provide education and explanation of THRR including how the numbers were predicted and where they are located for reference       Expected Outcomes  Short Term: Able to state/look up THRR;Long Term: Able to use THRR to govern intensity when exercising  independently;Short Term: Able to use daily as guideline for intensity in rehab       Able to check pulse independently  Yes       Intervention  Provide education and demonstration on how to check pulse in carotid and radial arteries.;Review the importance of being able to check your own pulse for safety during independent exercise       Expected Outcomes  Short Term: Able to explain why pulse checking is important during independent exercise;Long Term: Able to check pulse independently and accurately       Understanding of Exercise Prescription  Yes       Intervention  Provide education, explanation, and written materials on patient's individual exercise prescription  Expected Outcomes  Short Term: Able to explain program exercise prescription;Long Term: Able to explain home exercise prescription to exercise independently          Nutrition & Weight - Outcomes: Pre Biometrics - 12/14/16 1459      Pre Biometrics   Height  5' 8.5" (1.74 m)    Weight  198 lb 1.6 oz (89.9 kg)    Waist Circumference  41.5 inches    Hip Circumference  40 inches    Waist to Hip Ratio  1.04 %    BMI (Calculated)  29.68    Single Leg Stand  20.27 seconds        Nutrition: Nutrition Therapy & Goals - 12/14/16 1320      Nutrition Therapy   RD appointment deferred  Yes    Drug/Food Interactions  Statins/Certain Fruits       Nutrition Discharge: Nutrition Assessments - 12/14/16 1320      MEDFICTS Scores   Pre Score  20       Education Questionnaire Score:   Goals reviewed with patient; copy given to patient.

## 2017-04-09 NOTE — Addendum Note (Signed)
Addended by: Valora Corporal on: 04/09/2017 08:51 AM   Modules accepted: Orders

## 2017-04-16 DIAGNOSIS — D044 Carcinoma in situ of skin of scalp and neck: Secondary | ICD-10-CM | POA: Diagnosis not present

## 2017-04-16 DIAGNOSIS — X32XXXA Exposure to sunlight, initial encounter: Secondary | ICD-10-CM | POA: Diagnosis not present

## 2017-04-16 DIAGNOSIS — D485 Neoplasm of uncertain behavior of skin: Secondary | ICD-10-CM | POA: Diagnosis not present

## 2017-04-16 DIAGNOSIS — Z85828 Personal history of other malignant neoplasm of skin: Secondary | ICD-10-CM | POA: Diagnosis not present

## 2017-04-16 DIAGNOSIS — L570 Actinic keratosis: Secondary | ICD-10-CM | POA: Diagnosis not present

## 2017-04-16 DIAGNOSIS — Z08 Encounter for follow-up examination after completed treatment for malignant neoplasm: Secondary | ICD-10-CM | POA: Diagnosis not present

## 2017-04-23 DIAGNOSIS — E1159 Type 2 diabetes mellitus with other circulatory complications: Secondary | ICD-10-CM | POA: Diagnosis not present

## 2017-04-23 DIAGNOSIS — I10 Essential (primary) hypertension: Secondary | ICD-10-CM | POA: Diagnosis not present

## 2017-05-01 DIAGNOSIS — E1159 Type 2 diabetes mellitus with other circulatory complications: Secondary | ICD-10-CM | POA: Diagnosis not present

## 2017-05-01 DIAGNOSIS — Z0000 Encounter for general adult medical examination without abnormal findings: Secondary | ICD-10-CM | POA: Diagnosis not present

## 2017-05-01 DIAGNOSIS — D638 Anemia in other chronic diseases classified elsewhere: Secondary | ICD-10-CM | POA: Diagnosis not present

## 2017-05-01 DIAGNOSIS — E669 Obesity, unspecified: Secondary | ICD-10-CM | POA: Diagnosis not present

## 2017-05-01 DIAGNOSIS — I10 Essential (primary) hypertension: Secondary | ICD-10-CM | POA: Diagnosis not present

## 2017-05-03 ENCOUNTER — Encounter: Payer: PPO | Attending: Cardiovascular Disease | Admitting: *Deleted

## 2017-05-03 VITALS — Ht 67.75 in | Wt 197.3 lb

## 2017-05-03 DIAGNOSIS — I208 Other forms of angina pectoris: Secondary | ICD-10-CM

## 2017-05-03 DIAGNOSIS — I209 Angina pectoris, unspecified: Secondary | ICD-10-CM | POA: Insufficient documentation

## 2017-05-03 NOTE — Progress Notes (Signed)
Cardiac Individual Treatment Plan  Patient Details  Name: Ronald Wallace MRN: [id number] Date of Birth: 11/22/1940 Referring Provider:     Cardiac Rehab from 05/03/2017 in [facility] Cardiac and Pulmonary Rehab  Referring Provider  [name]      Initial Encounter Date:    Cardiac Rehab from 05/03/2017 in [facility] Cardiac and Pulmonary Rehab  Date  05/03/17  Referring Provider  Rockey Situ      Visit Diagnosis: Stable angina ([place])  Patient's Home Medications on Admission:  Current Outpatient Medications:  .  aspirin 81 MG tablet, Take 81 mg by mouth daily., Disp: , Rfl:  .  calcium carbonate (OS-CAL) 600 MG TABS tablet, Take 600 mg by mouth daily with breakfast., Disp: , Rfl:  .  cholecalciferol (VITAMIN D) 1000 UNITS tablet, Take 1,000 Units by mouth daily., Disp: , Rfl:  .  Ferrous Sulfate (IRON) 325 (65 FE) MG TABS, Take 1 tablet by mouth daily. , Disp: , Rfl:  .  gabapentin (NEURONTIN) 300 MG capsule, Take 600 mg by mouth at bedtime., Disp: , Rfl:  .  hydrochlorothiazide (MICROZIDE) 12.5 MG capsule, Take 12.5 mg by mouth daily. , Disp: , Rfl:  .  isosorbide mononitrate (IMDUR) 30 MG 24 hr tablet, Take 1 tablet (30 mg total) by mouth daily., Disp: 90 tablet, Rfl: 1 .  LANTUS 100 UNIT/ML injection, Inject 42 Units into the skin daily. Take at bedtime, Disp: , Rfl:  .  lisinopril (PRINIVIL,ZESTRIL) 20 MG tablet, Take 20 mg by mouth daily., Disp: , Rfl:  .  metFORMIN (GLUCOPHAGE) 500 MG tablet, Take 250 mg by mouth 2 (two) times daily with a meal., Disp: , Rfl:  .  NOVOLOG 100 UNIT/ML injection, Inject into the skin 3 (three) times daily with meals. 12 units am, 12 units lunch, 14 units supper, Disp: , Rfl:  .  simvastatin (ZOCOR) 20 MG tablet, Take 20 mg by mouth daily., Disp: , Rfl:  .  tamsulosin (FLOMAX) 0.4 MG CAPS capsule, Take 0.4 mg by mouth daily., Disp: , Rfl:  .  furosemide (LASIX) 20 MG tablet, Take 1 tablet (20 mg total) by mouth as needed for fluid (for shortness of breath)., Disp: 30  tablet, Rfl: 6  Past Medical History: Past Medical History:  Diagnosis Date  . Anemia   . CAD (coronary artery disease)    a. 02/2014 Neg Ex MV, EF 65%;  b. 06/2015 Abnl MV ([facility])-->Cath ([facility]): LM nl, LAD 30p/m, LCX 30p, OM2 50p, RCA 100p w/ L-->R collats, nl EF-->Med Rx.  . Diabetes mellitus, type II, insulin dependent ([place])    With neuropathy  . Diverticulitis   . Essential hypertension   . Hyperlipidemia   . Neuropathy   . Shortness of breath dyspnea   . Syncope and collapse    a. 02/2014 Carotid Dopplers: 1-39% bilateral ICA with normal Subclavian & vertebral arteries; b. 02/2014 2 D Echo: EF 50-55%, Gr 1 DD, Mild MR & LA dilation -- was bradycardic in 40s.    Tobacco Use: Social History   Tobacco Use  Smoking Status Never Smoker  Smokeless Tobacco Never Used    Labs: Recent Review Flowsheet Data    There is no flowsheet data to display.       Exercise Target Goals: Date: 05/03/17  Exercise Program Goal: Individual exercise prescription set using results from initial 6 min walk test and THRR while considering  patient's activity barriers and safety.   Exercise Prescription Goal: Initial exercise prescription builds to 30-45 minutes a  day of aerobic activity, 2-3 days per week.  Home exercise guidelines will be given to patient during program as part of exercise prescription that the participant will acknowledge.  Activity Barriers & Risk Stratification: Activity Barriers & Cardiac Risk Stratification - 05/03/17 1439      Activity Barriers & Cardiac Risk Stratification   Activity Barriers  Back Problems;Shortness of Breath    Cardiac Risk Stratification  High       6 Minute Walk: 6 Minute Walk    Row Name 12/14/16 1501 05/03/17 1421       6 Minute Walk   Distance  1488 feet  1355 feet    Walk Time  6 minutes  6 minutes    # of Rest Breaks  0  0    MPH  2.82  2.56    METS  3.25  2.77    RPE  9  13    Perceived Dyspnea   3  1    VO2 Peak  11.36   9.72    Symptoms  No  Yes (comment)    Comments  -  dizziness    Resting HR  63 bpm  75 bpm    Resting BP  132/64  108/60    Resting Oxygen Saturation   96 %  -    Exercise Oxygen Saturation  during 6 min walk  95 %  97 %    Max Ex. HR  129 bpm  120 bpm    Max Ex. BP  162/64  132/52    2 Minute Post BP  130/60  122/64       Oxygen Initial Assessment:   Oxygen Re-Evaluation:   Oxygen Discharge (Final Oxygen Re-Evaluation):   Initial Exercise Prescription: Initial Exercise Prescription - 05/03/17 1400      Date of Initial Exercise RX and Referring Provider   Date  05/03/17    Referring Provider  [name]      Treadmill   MPH  2.5    Grade  0    Minutes  15    METs  2.77      NuStep   Level  2    SPM  80    Minutes  15    METs  2.7      REL-XR   Level  2    Speed  50    Minutes  15    METs  2.7      Prescription Details   Frequency (times per week)  3    Duration  Progress to 45 minutes of aerobic exercise without signs/symptoms of physical distress      Intensity   THRR 40-80% of Max Heartrate  103-131    Ratings of Perceived Exertion  11-13    Perceived Dyspnea  0-4      Resistance Training   Training Prescription  Yes    Weight  3 lb    Reps  10-15       Perform Capillary Blood Glucose checks as needed.  Exercise Prescription Changes: Exercise Prescription Changes    Row Name 12/26/16 1300 01/24/17 1500 01/31/17 0900 02/05/17 1500 05/03/17 1400     Response to Exercise   Blood Pressure (Admit)  134/70  132/64  -  122/60  108/60   Blood Pressure (Exercise)  134/60  144/52  -  126/74  132/52   Blood Pressure (Exit)  126/54  166/80  -  126/70  122/64   Heart Rate (Admit)  73 bpm  60 bpm  -  65 bpm  75 bpm   Heart Rate (Exercise)  105 bpm  110 bpm  -  107 bpm  120 bpm   Heart Rate (Exit)  68 bpm  70 bpm  -  62 bpm  73 bpm   Rating of Perceived Exertion (Exercise)  13  14  -  12  13   Symptoms  none  none  -  none  -   Comments  first full day of  exercise  third full day of exercise  -  -  -   Duration  Progress to 45 minutes of aerobic exercise without signs/symptoms of physical distress  Progress to 45 minutes of aerobic exercise without signs/symptoms of physical distress  -  Continue with 45 min of aerobic exercise without signs/symptoms of physical distress.  -   Intensity  THRR unchanged  THRR unchanged  -  THRR unchanged  -     Progression   Progression  Continue to progress workloads to maintain intensity without signs/symptoms of physical distress.  Continue to progress workloads to maintain intensity without signs/symptoms of physical distress.  -  Continue to progress workloads to maintain intensity without signs/symptoms of physical distress.  -   Average METs  3.22  2.98  -  3.42  -     Resistance Training   Training Prescription  Yes  Yes  -  Yes  -   Weight  3 lb  3 lb  -  3 lb  -   Reps  10-15  10-15  -  10-15  -     Interval Training   Interval Training  No  No  -  No  -     Treadmill   MPH  2.7  2.7  -  2.9  -   Grade  0.5  0.5  -  1  -   Minutes  15  15  -  15  -   METs  3.25  3.25  -  3.62  -     NuStep   Level  3  3  -  3  -   Minutes  15  15  -  15  -   METs  3  4  -  3.4  -     REL-XR   Level  2  2  -  2  -   Minutes  15  15  -  15  -   METs  3.4  1.7  -  3.4  -     Home Exercise Plan   Plans to continue exercise at  -  -  [employer] (comment) walking, treadmill, [employer] (comment) walking, treadmill, [employer]  -   Frequency  -  -  Add 2 additional days to program exercise sessions.  Add 2 additional days to program exercise sessions.  -   Initial Home Exercises Provided  -  -  01/31/17  01/31/17  -      Exercise Comments: Exercise Comments    Row Name 12/22/16 1937           Exercise Comments  First full day of exercise!  Patient was oriented to gym and equipment including functions, settings, policies, and procedures.  Patient's individual exercise prescription and  treatment plan were reviewed.  All starting workloads were established based on the results of the 6 minute walk test done at initial orientation  visit.  The plan for exercise progression was also introduced and progression will be customized based on patient's performance and goals.          Exercise Goals and Review: Exercise Goals    Row Name 12/14/16 1500             Exercise Goals   Increase Physical Activity  Yes       Intervention  Provide advice, education, support and counseling about physical activity/exercise needs.;Develop an individualized exercise prescription for aerobic and resistive training based on initial evaluation findings, risk stratification, comorbidities and participant's personal goals.       Expected Outcomes  Achievement of increased cardiorespiratory fitness and enhanced flexibility, muscular endurance and strength shown through measurements of functional capacity and personal statement of participant.       Increase Strength and Stamina  Yes       Intervention  Provide advice, education, support and counseling about physical activity/exercise needs.;Develop an individualized exercise prescription for aerobic and resistive training based on initial evaluation findings, risk stratification, comorbidities and participant's personal goals.       Expected Outcomes  Achievement of increased cardiorespiratory fitness and enhanced flexibility, muscular endurance and strength shown through measurements of functional capacity and personal statement of participant.       Able to understand and use rate of perceived exertion (RPE) scale  Yes       Intervention  Provide education and explanation on how to use RPE scale       Expected Outcomes  Short Term: Able to use RPE daily in rehab to express subjective intensity level;Long Term:  Able to use RPE to guide intensity level when exercising independently       Able to understand and use Dyspnea scale  Yes       Intervention   Provide education and explanation on how to use Dyspnea scale       Expected Outcomes  Short Term: Able to use Dyspnea scale daily in rehab to express subjective sense of shortness of breath during exertion;Long Term: Able to use Dyspnea scale to guide intensity level when exercising independently       Knowledge and understanding of Target Heart Rate Range (THRR)  Yes       Intervention  Provide education and explanation of THRR including how the numbers were predicted and where they are located for reference       Expected Outcomes  Short Term: Able to state/look up THRR;Long Term: Able to use THRR to govern intensity when exercising independently;Short Term: Able to use daily as guideline for intensity in rehab       Able to check pulse independently  Yes       Intervention  Provide education and demonstration on how to check pulse in carotid and radial arteries.;Review the importance of being able to check your own pulse for safety during independent exercise       Expected Outcomes  Short Term: Able to explain why pulse checking is important during independent exercise;Long Term: Able to check pulse independently and accurately       Understanding of Exercise Prescription  Yes       Intervention  Provide education, explanation, and written materials on patient's individual exercise prescription       Expected Outcomes  Short Term: Able to explain program exercise prescription;Long Term: Able to explain home exercise prescription to exercise independently          Exercise Goals Re-Evaluation :  Exercise Goals Re-Evaluation    Row Name 12/22/16 6803 01/10/17 1511 01/24/17 1528 01/31/17 0847 02/05/17 1537     Exercise Goal Re-Evaluation   Exercise Goals Review  Understanding of Exercise Prescription;Knowledge and understanding of Target Heart Rate Range (THRR);Able to understand and use rate of perceived exertion (RPE) scale  -  Increase Physical Activity;Increase Strength and  Stamina;Understanding of Exercise Prescription  Increase Physical Activity;Increase Strength and Stamina;Understanding of Exercise Prescription  Increase Physical Activity;Increase Strength and Stamina;Understanding of Exercise Prescription   Comments  Reviewed RPE scale, THR and program prescription with pt today.  Pt voiced understanding and was given a copy of goals to take home.   After some dermatology surgery, Ronald Wallace's doctor requested that he stay out until after the new year.   Ronald Wallace is off to a good start in rehab.  He has had offically three full days of exercise due to his surgery.  He was able to return at his current workloads without a problem.  We will start to increase his workloads and continue to monitor his progress.   Ronald Wallace is doing well in rehab. He is feeling stronger and has more stamina.   Currently, he has been walking some at home.  Reviewed home exercise with pt today.  Pt plans to walking, treadmill, and returning to [facility]  for exercise.  Reviewed THR, pulse, RPE, sign and symptoms, and when to call 911 or MD.  Also discussed weather considerations and indoor options.  Pt voiced understanding.  Ronald Wallace has been doing well in rehab.  He is now up to 3.4 METs on both the XR and NuStep.  We will continue to monitor his progression.    Expected Outcomes  Short: Use RPE daily to regulate intensity.  Long: Follow program prescription in THR.  -  Short: Increase incline on treadmill and review home exercise.  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   Short: Return to [facility] for exercise.  Long: Continue to increase strength and stamina.   Short: Increase workloads on XR and NuStep.  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    [place] Name 02/20/17 1519 03/21/17 1439           Exercise Goal Re-Evaluation   Comments  Out since last review.  Out since last review.         Discharge Exercise Prescription (Final Exercise Prescription  Changes): Exercise Prescription Changes - 05/03/17 1400      Response to Exercise   Blood Pressure (Admit)  108/60    Blood Pressure (Exercise)  132/52    Blood Pressure (Exit)  122/64    Heart Rate (Admit)  75 bpm    Heart Rate (Exercise)  120 bpm    Heart Rate (Exit)  73 bpm    Rating of Perceived Exertion (Exercise)  13       Nutrition:  Target Goals: Understanding of nutrition guidelines, daily intake of sodium <1541m, cholesterol <2041m calories 30% from fat and 7% or less from saturated fats, daily to have 5 or more servings of fruits and vegetables.  Biometrics: Pre Biometrics - 05/03/17 1420      Pre Biometrics   Height  5' 7.75" (1.721 m)    Weight  197 lb 4.8 oz (89.5 kg)    Waist Circumference  44 inches    Hip Circumference  39 inches    Waist to Hip Ratio  1.13 %    BMI (Calculated)  30.22[place]  Single Leg Stand  1.27 seconds        Nutrition Therapy Plan and Nutrition Goals: Nutrition Therapy & Goals - 05/03/17 1441      Intervention Plan   Intervention  Prescribe, educate and counsel regarding individualized specific dietary modifications aiming towards targeted core components such as weight, hypertension, lipid management, diabetes, heart failure and other comorbidities.    Expected Outcomes  Short Term Goal: Understand basic principles of dietary content, such as calories, fat, sodium, cholesterol and nutrients.;Short Term Goal: A plan has been developed with personal nutrition goals set during dietitian appointment.;Long Term Goal: Adherence to prescribed nutrition plan.       Nutrition Assessments: Nutrition Assessments - 05/03/17 1441      MEDFICTS Scores   Pre Score  52       Nutrition Goals Re-Evaluation: Nutrition Goals Re-Evaluation    Row Name 01/31/17 0851             Goals   Nutrition Goal  Meet with nutritionist on 1/30 during class       Comment  Ronald Wallace would like to meet with nutrtionist during class as lives is [employer].   He would like to just hear some tips for weight loss and how to cut back on his snacking.        Expected Outcome  Short: Meet with dietician  Long: Cut back on snacking.           Nutrition Goals Discharge (Final Nutrition Goals Re-Evaluation): Nutrition Goals Re-Evaluation - 01/31/17 0851      Goals   Nutrition Goal  Meet with nutritionist on 1/30 during class    Comment  Ronald Wallace would like to meet with nutrtionist during class as lives is [employer].  He would like to just hear some tips for weight loss and how to cut back on his snacking.     Expected Outcome  Short: Meet with dietician  Long: Cut back on snacking.        Psychosocial: Target Goals: Acknowledge presence or absence of significant depression and/or stress, maximize coping skills, provide positive support system. Participant is able to verbalize types and ability to use techniques and skills needed for reducing stress and depression.   Initial Review & Psychosocial Screening: Initial Psych Review & Screening - 05/03/17 1444      Initial Review   Current issues with  Current Stress Concerns    Source of Stress Concerns  Unable to perform yard/household activities;Family    Comments  Ronald Wallace has a married child and has stress about this family unit      [place]?  Yes      Barriers   Psychosocial barriers to participate in program  There are no identifiable barriers or psychosocial needs.;The patient should benefit from training in stress management and relaxation.      Screening Interventions   Interventions  Encouraged to exercise;Provide feedback about the scores to participant    Expected Outcomes  Short Term goal: Utilizing psychosocial counselor, staff and physician to assist with identification of specific Stressors or current issues interfering with healing process. Setting desired goal for each stressor or current issue identified.;Long Term Goal: Stressors or current issues  are controlled or eliminated.;Short Term goal: Identification and review with participant of any Quality of Life or Depression concerns found by scoring the questionnaire.;Long Term goal: The participant improves quality of Life and PHQ9 Scores as seen by post scores and/or verbalization of  changes       Quality of Life Scores:  Quality of Life - 05/03/17 1445      Quality of Life Scores   Health/Function Pre  18.37 %    Socioeconomic Pre  23.71 %    Psych/Spiritual Pre  24.43 %    Family Pre  22.8 %    GLOBAL Pre  18.37 %      Scores of 19 and below usually indicate a poorer quality of life in these areas.  A difference of  2-3 points is a clinically meaningful difference.  A difference of 2-3 points in the total score of the Quality of Life Index has been associated with significant improvement in overall quality of life, self-image, physical symptoms, and general health in studies assessing change in quality of life.  PHQ-9: Recent Review Flowsheet Data    Depression screen [facility] 2/9 05/03/2017 01/24/2017 12/14/2016 12/14/2016   Decreased Interest 2 1 - 0   Down, Depressed, Hopeless 0 1 - 2   PHQ - 2 Score 2 2 - 2   Altered sleeping 0 0 - 3   Tired, decreased energy 2 1 - 2   Change in appetite 0 0 - 0   Feeling bad or failure about yourself  2 0 - 1   Trouble concentrating 0 0 - 3   Moving slowly or fidgety/restless 0 0 - 0   Suicidal thoughts 0 0 0  1   PHQ-9 Score 6 3 - 12   Difficult doing work/chores Somewhat difficult Not difficult at all - -     Interpretation of Total Score  Total Score Depression Severity:  1-4 = Minimal depression, 5-9 = Mild depression, 10-14 = Moderate depression, 15-19 = Moderately severe depression, 20-27 = Severe depression   Psychosocial Evaluation and Intervention: Psychosocial Evaluation - 01/22/17 0946      Psychosocial Evaluation & Interventions   Interventions  Encouraged to exercise with the program and follow exercise prescription;Stress  management education    Comments  Counselor met with Ronald Wallace) today for initial psychosocial evaluation.  He is a 77 year old who was recently diagnosed with stable angina.  He has a strong support system with a spouse of 69 years; a daughter and nephew who live locally and active involvement in his local church community.  Ronald Wallace is also a diabetic and has had some dermatological issues preventing him from being consistent in his attendance lately.  He reports sleeping "too much" with ~10 hours at night and sometimes over 2 hours during the day.  He reports having a good appetite and denies a history of depression.  But Ronald Wallace states he struggles with anxiety at times and was tearful discussing concerns for his grandchildren and somewhat estranged from his son who lives in [place].  He reports his mood is generally positive most of the time.  Ronald Wallace has multiple stressors with his health; concerns for his grandchildren and his relationship with his son.  He has goals to increase his stamina and strength and improve his flexibility and balance while in this program.  He would like his quality of life to improve overall as well.  Counselor encouraged Ronald Wallace to find ways to stay busy during the day since he reports being bored often during the winter months.  Counselor also mentioned to Ronald Wallace to consider taking a train or bus to visit his son in [place] since he refuses to drive or fly that far at this time.  He  agreed to consider this option since he misses his son so much and was very tearful about this relationship.  Counselor and staff will follow with Ronald Wallace throughout the course of this program.      Expected Outcomes  Ronald Wallace will exercise consistently to achieve his stated goals.  He will consider positive ways to improve his quality of life as suggested.  The educational and psychoeducational components of this program will be beneficial for Ronald Wallace to learn more about his condition and positive ways  to cope in general.      Continue Psychosocial Services   Follow up required by staff       Psychosocial Re-Evaluation: Psychosocial Re-Evaluation    [place] Name 01/31/17 0855             Psychosocial Re-Evaluation   Current issues with  Current Stress Concerns       Comments  Ronald Wallace has been doing well in rehab.  He remains postive overall.  He and his son are doing better and he's doing better too.  His son is out in [place] skiing, but called him on Sunday.  He continues to sleep well.  He has found that exercise helps him feel better and he is not as sore any more.        Expected Outcomes  Short: Continue to attend rehab and reduce soreness.  Long: Continue to maintain positive attitude.           Psychosocial Discharge (Final Psychosocial Re-Evaluation): Psychosocial Re-Evaluation - 01/31/17 0855      Psychosocial Re-Evaluation   Current issues with  Current Stress Concerns    Comments  Ronald Wallace has been doing well in rehab.  He remains postive overall.  He and his son are doing better and he's doing better too.  His son is out in [place] skiing, but called him on Sunday.  He continues to sleep well.  He has found that exercise helps him feel better and he is not as sore any more.     Expected Outcomes  Short: Continue to attend rehab and reduce soreness.  Long: Continue to maintain positive attitude.        Vocational Rehabilitation: Provide vocational rehab assistance to qualifying candidates.   Vocational Rehab Evaluation & Intervention: Vocational Rehab - 05/03/17 1452      Initial Vocational Rehab Evaluation & Intervention   Assessment shows need for Vocational Rehabilitation  No       Education: Education Goals: Education classes will be provided on a variety of topics geared toward better understanding of heart health and risk factor modification. Participant will state understanding/return demonstration of topics presented as noted by education test  scores.  Learning Barriers/Preferences: Learning Barriers/Preferences - 05/03/17 1451      Learning Barriers/Preferences   Learning Barriers  None    Learning Preferences  None       Education Topics:  AED/CPR: - Group verbal and written instruction with the use of models to demonstrate the basic use of the AED with the basic ABC's of resuscitation.   General Nutrition Guidelines/Fats and Fiber: -Group instruction provided by verbal, written material, models and posters to present the general guidelines for heart healthy nutrition. Gives an explanation and review of dietary fats and fiber.   Controlling Sodium/Reading Food Labels: -Group verbal and written material supporting the discussion of sodium use in heart healthy nutrition. Review and explanation with models, verbal and written materials for utilization of the food label.   Exercise Physiology &  General Exercise Guidelines: - Group verbal and written instruction with models to review the exercise physiology of the cardiovascular system and associated critical values. Provides general exercise guidelines with specific guidelines to those with heart or lung disease.    Aerobic Exercise & Resistance Training: - Gives group verbal and written instruction on the various components of exercise. Focuses on aerobic and resistive training programs and the benefits of this training and how to safely progress through these programs..   Flexibility, Balance, Mind/Body Relaxation: Provides group verbal/written instruction on the benefits of flexibility and balance training, including mind/body exercise modes such as yoga, pilates and tai chi.  Demonstration and skill practice provided.   Cardiac Rehab from 01/31/2017 in [facility] Cardiac and Pulmonary Rehab  Date  01/22/17  Educator  [facility]  Instruction Review Code  1- Verbalizes Understanding      Stress and Anxiety: - Provides group verbal and written instruction about the health risks  of elevated stress and causes of high stress.  Discuss the correlation between heart/lung disease and anxiety and treatment options. Review healthy ways to manage with stress and anxiety.   Depression: - Provides group verbal and written instruction on the correlation between heart/lung disease and depressed mood, treatment options, and the stigmas associated with seeking treatment.   Anatomy & Physiology of the Heart: - Group verbal and written instruction and models provide basic cardiac anatomy and physiology, with the coronary electrical and arterial systems. Review of Valvular disease and Heart Failure   Cardiac Procedures: - Group verbal and written instruction to review commonly prescribed medications for heart disease. Reviews the medication, class of the drug, and side effects. Includes the steps to properly store meds and maintain the prescription regimen. (beta blockers and nitrates)   Cardiac Medications I: - Group verbal and written instruction to review commonly prescribed medications for heart disease. Reviews the medication, class of the drug, and side effects. Includes the steps to properly store meds and maintain the prescription regimen.   Cardiac Rehab from 01/31/2017 in [facility] Cardiac and Pulmonary Rehab  Date  01/29/17  Educator  [place]  Instruction Review Code  1- Verbalizes Understanding      Cardiac Medications II: -Group verbal and written instruction to review commonly prescribed medications for heart disease. Reviews the medication, class of the drug, and side effects. (all other drug classes)    Go Sex-Intimacy & Heart Disease, Get SMART - Goal Setting: - Group verbal and written instruction through game format to discuss heart disease and the return to sexual intimacy. Provides group verbal and written material to discuss and apply goal setting through the application of the S.M.A.R.T. Method.   Other Matters of the Heart: - Provides group verbal, written  materials and models to describe Stable Angina and Peripheral Artery. Includes description of the disease process and treatment options available to the cardiac patient.   Exercise & Equipment Safety: - Individual verbal instruction and demonstration of equipment use and safety with use of the equipment.   Cardiac Rehab from 01/31/2017 in [facility] Cardiac and Pulmonary Rehab  Date  01/22/17  Educator  [facility]  Instruction Review Code  1- Verbalizes Understanding      Infection Prevention: - Provides verbal and written material to individual with discussion of infection control including proper hand washing and proper equipment cleaning during exercise session.   Cardiac Rehab from 01/31/2017 in [facility] Cardiac and Pulmonary Rehab  Date  01/22/17  Educator  [facility]  Instruction Review Code  1- [employer]  Understanding      Falls Prevention: - Provides verbal and written material to individual with discussion of falls prevention and safety.   Diabetes: - Individual verbal and written instruction to review signs/symptoms of diabetes, desired ranges of glucose level fasting, after meals and with exercise. Acknowledge that pre and post exercise glucose checks will be done for 3 sessions at entry of program.   Know Your Numbers and Risk Factors: -Group verbal and written instruction about important numbers in your health.  Discussion of what are risk factors and how they play a role in the disease process.  Review of Cholesterol, Blood Pressure, Diabetes, and BMI and the role they play in your overall health.   Sleep Hygiene: -Provides group verbal and written instruction about how sleep can affect your health.  Define sleep hygiene, discuss sleep cycles and impact of sleep habits. Review good sleep hygiene tips.    Other: -Provides group and verbal instruction on various topics (see comments)   Cardiac Rehab from 01/31/2017 in [facility] Cardiac and Pulmonary Rehab  Date  01/24/17  Educator  [facility]    Instruction Review Code  1- Verbalizes Understanding [Risk Factors and Know Your Numbers]      Knowledge Questionnaire Score: Knowledge Questionnaire Score - 05/03/17 1452      Knowledge Questionnaire Score   Pre Score  21/28 REviewed correct responses with Ronald Wallace today. He verbalized understanding and had no further questions       Core Components/Risk Factors/Patient Goals at Admission: Personal Goals and Risk Factors at Admission - 05/03/17 1441      Core Components/Risk Factors/Patient Goals on Admission   Intervention  Weight Management: Develop a combined nutrition and exercise program designed to reach desired caloric intake, while maintaining appropriate intake of nutrient and fiber, sodium and fats, and appropriate energy expenditure required for the weight goal.;Weight Management/Obesity: Establish reasonable short term and long term weight goals.;Obesity: Provide education and appropriate resources to help participant work on and attain dietary goals.    Admit Weight  197 lb 4.8 oz (89.5 kg)    Goal Weight: Short Term  195 lb (88.5 kg)    Goal Weight: Long Term  175 lb (79.4 kg)    Expected Outcomes  Short Term: Continue to assess and modify interventions until short term weight is achieved;Weight Loss: Understanding of general recommendations for a balanced deficit meal plan, which promotes 1-2 lb weight loss per week and includes a negative energy balance of [phone] kcal/d;Understanding of distribution of calorie intake throughout the day with the consumption of 4-5 meals/snacks;Understanding recommendations for meals to include 15-35% energy as protein, 25-35% energy from fat, 35-60% energy from carbohydrates, less than 227m of dietary cholesterol, 20-35 gm of total fiber daily    Improve shortness of breath with ADL's  Yes    Intervention  Provide education, individualized exercise plan and daily activity instruction to help decrease symptoms of SOB with activities of daily  living.    Expected Outcomes  Short Term: Improve cardiorespiratory fitness to achieve a reduction of symptoms when performing ADLs;Long Term: Be able to perform more ADLs without symptoms or delay the onset of symptoms    Diabetes  Yes    Intervention  Provide education about signs/symptoms and action to take for hypo/hyperglycemia.;Provide education about proper nutrition, including hydration, and aerobic/resistive exercise prescription along with prescribed medications to achieve blood glucose in normal ranges: Fasting glucose 65-99 mg/dL    Expected Outcomes  Short Term: Participant verbalizes understanding of the  signs/symptoms and immediate care of hyper/hypoglycemia, proper foot care and importance of medication, aerobic/resistive exercise and nutrition plan for blood glucose control.;Long Term: Attainment of HbA1C < 7%.    Hypertension  Yes    Intervention  Provide education on lifestyle modifcations including regular physical activity/exercise, weight management, moderate sodium restriction and increased consumption of fresh fruit, vegetables, and low fat dairy, alcohol moderation, and smoking cessation.;Monitor prescription use compliance.    Expected Outcomes  Short Term: Continued assessment and intervention until BP is < 140/23m HG in hypertensive participants. < 130/839mHG in hypertensive participants with diabetes, heart failure or chronic kidney disease.;Long Term: Maintenance of blood pressure at goal levels.    Lipids  Yes    Intervention  Provide education and support for participant on nutrition & aerobic/resistive exercise along with prescribed medications to achieve LDL <701mHDL >88m44m  Expected Outcomes  Short Term: Participant states understanding of desired cholesterol values and is compliant with medications prescribed. Participant is following exercise prescription and nutrition guidelines.;Long Term: Cholesterol controlled with medications as prescribed, with  individualized exercise RX and with personalized nutrition plan. Value goals: LDL < 70mg39mL > 40 mg.    Stress  Yes Family stress is a concern for [employer] Intervention  Offer individual and/or small group education and counseling on adjustment to heart disease, stress management and health-related lifestyle change. Teach and support self-help strategies.;Refer participants experiencing significant psychosocial distress to appropriate mental health specialists for further evaluation and treatment. When possible, include family members and significant others in education/counseling sessions.    Expected Outcomes  Short Term: Participant demonstrates changes in health-related behavior, relaxation and other stress management skills, ability to obtain effective social support, and compliance with psychotropic medications if prescribed.;Long Term: Emotional wellbeing is indicated by absence of clinically significant psychosocial distress or social isolation.       Core Components/Risk Factors/Patient Goals Review:  Goals and Risk Factor Review    Row Name 01/31/17 0847             Core Components/Risk Factors/Patient Goals Review   Personal Goals Review  Weight Management/Obesity;Hypertension;Diabetes;Lipids;Heart Failure       Review  Ronald Boersbeen doing well in rehab.  He noticed that his weight was up a little today and admitted to eating more since his  wife was out.  He weighs daily to monitor his heart failure and has not had any symptoms.  He also monitors his salt intake.  His blood sugars have been pretty good on average. He has been watching what he eats.  He has noticed that his sugars are better when he exercises.  His wife is also diabetic and they work together to monitor their sugars.  His blood pressures have been pretty good, he checks them about  every other day.  He feels that his medications are working for him.        Expected Outcomes  Short: Continue to work on weight loss.   Long: Contiue to manage diabetes.           Core Components/Risk Factors/Patient Goals at Discharge (Final Review):  Goals and Risk Factor Review - 01/31/17 0847      Core Components/Risk Factors/Patient Goals Review   Personal Goals Review  Weight Management/Obesity;Hypertension;Diabetes;Lipids;Heart Failure    Review  Ronald Boersbeen doing well in rehab.  He noticed that his weight was up a little today and admitted to eating more since his  wife was  out.  He weighs daily to monitor his heart failure and has not had any symptoms.  He also monitors his salt intake.  His blood sugars have been pretty good on average. He has been watching what he eats.  He has noticed that his sugars are better when he exercises.  His wife is also diabetic and they work together to monitor their sugars.  His blood pressures have been pretty good, he checks them about  every other day.  He feels that his medications are working for him.     Expected Outcomes  Short: Continue to work on weight loss.  Long: Contiue to manage diabetes.        ITP Comments: ITP Comments    Row Name 12/14/16 1321 12/14/16 1324 12/20/16 0545 01/10/17 1510 01/17/17 0630   ITP Comments  ITP created during Medical Review/Orientation appt after Cardiac REhab informed consent was signed.   Stable angina note documented in Dr. Donivan Scull 11/27/2016 note  30 day review. Continue with ITP unless directed changes per Medical Director review.   After some dermatology surgery, Ronald Wallace's doctor requested that he stay out until after the new year.   30 day review. Continue with ITP unless directed changes per Medical Director review.   1 visit since last review   Row Name 02/08/17 1123 02/14/17 0557 02/20/17 1519 03/05/17 1551 03/14/17 0642   ITP Comments  Mrs. Piercefield called to let us know that Ronald Wallace has been out with shingles in his ear.  We are going to give a couple weeks to see if he gets any better.   30 Day review. Continue with ITP unless  directed changes per Medical Director review.   Called to check on Ronald Wallace with his Shingles. He is still taking his medications and has some open wounds.  Talked about making sure he finishes his medications and all wounds are sealed before he is able to return to rehab.   Called to check on status of return.  He continues to have pain from Shingles.  The rash has improved per office notes but he says the pain is still in his back.  We will continue to touch base with him about his progress.  30 day review completed. Continue with ITP unless diercted changes per Medical Director. Remains out this month with medical reason   Row Name 03/21/17 1439 04/02/17 1500 05/03/17 1432       ITP Comments  Called to check on pt status to return to rehab.  Spoke with his wife.  His wounds have all dried up and scabbed over.  He hopes to return on Monday.   Ronald Wallace called this morning with questions about whether or not he should return.  He has finally recovered from his shingles, but now is experiencing a lot of pain.  We are going to discharge him at this time as he has been out for almost two months and have him return under a new order and new walk test to finish the program.   Ronald Wallace returned to day to resume his cardiac rehab after discharging for lengthy time out because of medicl reason. ITP created and will be sent to Dr Loleta Chance for review, changes as needed and signature. Documentation of diagnosis can be found in [facility] encounter11/02/2016        Comments:

## 2017-05-03 NOTE — Progress Notes (Signed)
Daily Session Note  Patient Details  Name: Ronald Wallace MRN: [id number] Date of Birth: May 13, 1940 Referring Provider:     Cardiac Rehab from 05/03/2017 in [facility] Cardiac and Pulmonary Rehab  Referring Provider  [name]      Encounter Date: 05/03/2017  Check In: Session Check In - 05/03/17 1431      Check-In   Staff Present  Renita Papa, RN BSN;Weaver Tweed, RN, BSN, Lance Sell, BA, ACSM CEP, Exercise Physiologist    Supervising physician immediately available to respond to emergencies  See telemetry face sheet for immediately available ER MD    Warm-up and Cool-down  Performed as group-led instruction    Resistance Training Performed  Yes      Pain Assessment   Currently in Pain?  No/denies        Exercise Prescription Changes - 05/03/17 1400      Response to Exercise   Blood Pressure (Admit)  108/60    Blood Pressure (Exercise)  132/52    Blood Pressure (Exit)  122/64    Heart Rate (Admit)  75 bpm    Heart Rate (Exercise)  120 bpm    Heart Rate (Exit)  73 bpm    Rating of Perceived Exertion (Exercise)  13       Social History   Tobacco Use  Smoking Status Never Smoker  Smokeless Tobacco Never Used    Goals Met:  Exercise tolerated well Personal goals reviewed  Goals Unmet:  Not Applicable  Comments: return to complete the program after several month out with medical concern   Dr. Emily Filbert is Medical Director for [facility].

## 2017-05-03 NOTE — Patient Instructions (Signed)
Patient Instructions  Patient Details  Name: Ronald Wallace MRN: [id number] Date of Birth: [birth year] Referring Provider:  Minna Merritts, MD  Below are your personal goals for exercise, nutrition, and risk factors. Our goal is to help you stay on track towards obtaining and maintaining these goals. We will be discussing your progress on these goals with you throughout the program.  Initial Exercise Prescription: Initial Exercise Prescription - 05/03/17 1400      Date of Initial Exercise RX and Referring Provider   Date  05/03/17    Referring Provider  [name]      Treadmill   MPH  2.5    Grade  0    Minutes  15    METs  2.77      NuStep   Level  2    SPM  80    Minutes  15    METs  2.7      REL-XR   Level  2    Speed  50    Minutes  15    METs  2.7      Prescription Details   Frequency (times per week)  3    Duration  Progress to 45 minutes of aerobic exercise without signs/symptoms of physical distress      Intensity   THRR 40-80% of Max Heartrate  103-131    Ratings of Perceived Exertion  11-13    Perceived Dyspnea  0-4      Resistance Training   Training Prescription  Yes    Weight  3 lb    Reps  10-15       Exercise Goals: Frequency: Be able to perform aerobic exercise two to three times per week in program working toward 2-5 days per week of home exercise.  Intensity: Work with a perceived exertion of 11 (fairly light) - 15 (hard) while following your exercise prescription.  We will make changes to your prescription with you as you progress through the program.   Duration: Be able to do 30 to 45 minutes of continuous aerobic exercise in addition to a 5 minute warm-up and a 5 minute cool-down routine.   Nutrition Goals: Your personal nutrition goals will be established when you do your nutrition analysis with the dietician.  The following are general nutrition guidelines to follow: Cholesterol < 200mg /day Sodium < 1500mg /day Fiber: Men over 50  yrs - 30 grams per day  Personal Goals: Personal Goals and Risk Factors at Admission - 05/03/17 1441      Core Components/Risk Factors/Patient Goals on Admission   Intervention  Weight Management: Develop a combined nutrition and exercise program designed to reach desired caloric intake, while maintaining appropriate intake of nutrient and fiber, sodium and fats, and appropriate energy expenditure required for the weight goal.;Weight Management/Obesity: Establish reasonable short term and long term weight goals.;Obesity: Provide education and appropriate resources to help participant work on and attain dietary goals.    Admit Weight  197 lb 4.8 oz (89.5 kg)    Goal Weight: Short Term  195 lb (88.5 kg)    Goal Weight: Long Term  175 lb (79.4 kg)    Expected Outcomes  Short Term: Continue to assess and modify interventions until short term weight is achieved;Weight Loss: Understanding of general recommendations for a balanced deficit meal plan, which promotes 1-2 lb weight loss per week and includes a negative energy balance of [phone] kcal/d;Understanding of distribution of calorie intake throughout the day with the consumption of 4-5  meals/snacks;Understanding recommendations for meals to include 15-35% energy as protein, 25-35% energy from fat, 35-60% energy from carbohydrates, less than 200mg  of dietary cholesterol, 20-35 gm of total fiber daily    Improve shortness of breath with ADL's  Yes    Intervention  Provide education, individualized exercise plan and daily activity instruction to help decrease symptoms of SOB with activities of daily living.    Expected Outcomes  Short Term: Improve cardiorespiratory fitness to achieve a reduction of symptoms when performing ADLs;Long Term: Be able to perform more ADLs without symptoms or delay the onset of symptoms    Diabetes  Yes    Intervention  Provide education about signs/symptoms and action to take for hypo/hyperglycemia.;Provide education about  proper nutrition, including hydration, and aerobic/resistive exercise prescription along with prescribed medications to achieve blood glucose in normal ranges: Fasting glucose 65-99 mg/dL    Expected Outcomes  Short Term: Participant verbalizes understanding of the signs/symptoms and immediate care of hyper/hypoglycemia, proper foot care and importance of medication, aerobic/resistive exercise and nutrition plan for blood glucose control.;Long Term: Attainment of HbA1C < 7%.    Hypertension  Yes    Intervention  Provide education on lifestyle modifcations including regular physical activity/exercise, weight management, moderate sodium restriction and increased consumption of fresh fruit, vegetables, and low fat dairy, alcohol moderation, and smoking cessation.;Monitor prescription use compliance.    Expected Outcomes  Short Term: Continued assessment and intervention until BP is < 140/19mm HG in hypertensive participants. < 130/58mm HG in hypertensive participants with diabetes, heart failure or chronic kidney disease.;Long Term: Maintenance of blood pressure at goal levels.    Lipids  Yes    Intervention  Provide education and support for participant on nutrition & aerobic/resistive exercise along with prescribed medications to achieve LDL 70mg , HDL >40mg .    Expected Outcomes  Short Term: Participant states understanding of desired cholesterol values and is compliant with medications prescribed. Participant is following exercise prescription and nutrition guidelines.;Long Term: Cholesterol controlled with medications as prescribed, with individualized exercise RX and with personalized nutrition plan. Value goals: LDL < 70mg , HDL > 40 mg.    Stress  Yes Family stress is a concern for [employer].     Intervention  Offer individual and/or small group education and counseling on adjustment to heart disease, stress management and health-related lifestyle change. Teach and support self-help strategies.;Refer  participants experiencing significant psychosocial distress to appropriate mental health specialists for further evaluation and treatment. When possible, include family members and significant others in education/counseling sessions.    Expected Outcomes  Short Term: Participant demonstrates changes in health-related behavior, relaxation and other stress management skills, ability to obtain effective social support, and compliance with psychotropic medications if prescribed.;Long Term: Emotional wellbeing is indicated by absence of clinically significant psychosocial distress or social isolation.       Tobacco Use Initial Evaluation: Social History   Tobacco Use  Smoking Status Never Smoker  Smokeless Tobacco Never Used    Exercise Goals and Review: Exercise Goals    Row Name 12/14/16 1500             Exercise Goals   Increase Physical Activity  Yes       Intervention  Provide advice, education, support and counseling about physical activity/exercise needs.;Develop an individualized exercise prescription for aerobic and resistive training based on initial evaluation findings, risk stratification, comorbidities and participant's personal goals.       Expected Outcomes  Achievement of increased cardiorespiratory fitness and enhanced flexibility, muscular  endurance and strength shown through measurements of functional capacity and personal statement of participant.       Increase Strength and Stamina  Yes       Intervention  Provide advice, education, support and counseling about physical activity/exercise needs.;Develop an individualized exercise prescription for aerobic and resistive training based on initial evaluation findings, risk stratification, comorbidities and participant's personal goals.       Expected Outcomes  Achievement of increased cardiorespiratory fitness and enhanced flexibility, muscular endurance and strength shown through measurements of functional capacity and personal  statement of participant.       Able to understand and use rate of perceived exertion (RPE) scale  Yes       Intervention  Provide education and explanation on how to use RPE scale       Expected Outcomes  Short Term: Able to use RPE daily in rehab to express subjective intensity level;Long Term:  Able to use RPE to guide intensity level when exercising independently       Able to understand and use Dyspnea scale  Yes       Intervention  Provide education and explanation on how to use Dyspnea scale       Expected Outcomes  Short Term: Able to use Dyspnea scale daily in rehab to express subjective sense of shortness of breath during exertion;Long Term: Able to use Dyspnea scale to guide intensity level when exercising independently       Knowledge and understanding of Target Heart Rate Range (THRR)  Yes       Intervention  Provide education and explanation of THRR including how the numbers were predicted and where they are located for reference       Expected Outcomes  Short Term: Able to state/look up THRR;Long Term: Able to use THRR to govern intensity when exercising independently;Short Term: Able to use daily as guideline for intensity in rehab       Able to check pulse independently  Yes       Intervention  Provide education and demonstration on how to check pulse in carotid and radial arteries.;Review the importance of being able to check your own pulse for safety during independent exercise       Expected Outcomes  Short Term: Able to explain why pulse checking is important during independent exercise;Long Term: Able to check pulse independently and accurately       Understanding of Exercise Prescription  Yes       Intervention  Provide education, explanation, and written materials on patient's individual exercise prescription       Expected Outcomes  Short Term: Able to explain program exercise prescription;Long Term: Able to explain home exercise prescription to exercise independently           Copy of goals given to participant.

## 2017-05-07 ENCOUNTER — Encounter: Payer: PPO | Admitting: *Deleted

## 2017-05-07 DIAGNOSIS — I208 Other forms of angina pectoris: Secondary | ICD-10-CM

## 2017-05-07 DIAGNOSIS — I209 Angina pectoris, unspecified: Secondary | ICD-10-CM | POA: Diagnosis not present

## 2017-05-07 LAB — GLUCOSE, CAPILLARY
Glucose-Capillary: 185 mg/dL — ABNORMAL HIGH (ref 65–99)
Glucose-Capillary: 156 mg/dL — ABNORMAL HIGH (ref 65–99)

## 2017-05-07 NOTE — Progress Notes (Signed)
Daily Session Note  Patient Details  Name: Ronald Wallace MRN: [id number] Date of Birth: [birth year] Referring Provider:     Cardiac Rehab from 05/03/2017 in [facility]  Referring Provider  [name]      Encounter Date: 05/07/2017  Check In: Session Check In - 05/07/17 0803      Check-In   Location  [facility]    Staff Present  Heath Lark, RN, BSN, CCRP;Zalma Channing Luan Pulling, MA, RCEP, CCRP, Exercise Physiologist;Kelly Amedeo Plenty, BS, ACSM CEP, Exercise Physiologist    Supervising physician immediately available to respond to emergencies  See telemetry face sheet for immediately available ER MD    Medication changes reported      No    Fall or balance concerns reported     No    Warm-up and Cool-down  Performed on first and last piece of equipment    Resistance Training Performed  Yes    VAD Patient?  No      Pain Assessment   Currently in Pain?  No/denies          Social History   Tobacco Use  Smoking Status Never Smoker  Smokeless Tobacco Never Used    Goals Met:  Exercise tolerated well Personal goals reviewed No report of cardiac concerns or symptoms Strength training completed today  Goals Unmet:  Not Applicable  Comments: First full day of returning to exercise!  Patient was oriented to gym and equipment including functions, settings, policies, and procedures.  Patient's individual exercise prescription and treatment plan were reviewed.  All starting workloads were established based on the results of the 6 minute walk test done at initial orientation visit.  The plan for exercise progression was also introduced and progression will be customized based on patient's performance and goals.    Dr. Emily Filbert is Medical Director for [facility].

## 2017-05-09 ENCOUNTER — Encounter: Payer: Self-pay | Admitting: *Deleted

## 2017-05-09 DIAGNOSIS — I208 Other forms of angina pectoris: Secondary | ICD-10-CM

## 2017-05-09 DIAGNOSIS — I209 Angina pectoris, unspecified: Secondary | ICD-10-CM | POA: Diagnosis not present

## 2017-05-09 NOTE — Progress Notes (Signed)
Cardiac Individual Treatment Plan  Patient Details  Name: Ronald Wallace MRN: [id number] Date of Birth: 17-Dec-1940 Referring Provider:     Cardiac Rehab from 05/03/2017 in [facility] Cardiac and Pulmonary Rehab  Referring Provider  [name]      Initial Encounter Date:    Cardiac Rehab from 05/03/2017 in [facility] Cardiac and Pulmonary Rehab  Date  05/03/17  Referring Provider  Rockey Situ      Visit Diagnosis: Stable angina ([place])  Patient's Home Medications on Admission:  Current Outpatient Medications:  .  aspirin 81 MG tablet, Take 81 mg by mouth daily., Disp: , Rfl:  .  calcium carbonate (OS-CAL) 600 MG TABS tablet, Take 600 mg by mouth daily with breakfast., Disp: , Rfl:  .  cholecalciferol (VITAMIN D) 1000 UNITS tablet, Take 1,000 Units by mouth daily., Disp: , Rfl:  .  Ferrous Sulfate (IRON) 325 (65 FE) MG TABS, Take 1 tablet by mouth daily. , Disp: , Rfl:  .  furosemide (LASIX) 20 MG tablet, Take 1 tablet (20 mg total) by mouth as needed for fluid (for shortness of breath)., Disp: 30 tablet, Rfl: 6 .  gabapentin (NEURONTIN) 300 MG capsule, Take 600 mg by mouth at bedtime., Disp: , Rfl:  .  hydrochlorothiazide (MICROZIDE) 12.5 MG capsule, Take 12.5 mg by mouth daily. , Disp: , Rfl:  .  isosorbide mononitrate (IMDUR) 30 MG 24 hr tablet, Take 1 tablet (30 mg total) by mouth daily., Disp: 90 tablet, Rfl: 1 .  LANTUS 100 UNIT/ML injection, Inject 42 Units into the skin daily. Take at bedtime, Disp: , Rfl:  .  lisinopril (PRINIVIL,ZESTRIL) 20 MG tablet, Take 20 mg by mouth daily., Disp: , Rfl:  .  metFORMIN (GLUCOPHAGE) 500 MG tablet, Take 250 mg by mouth 2 (two) times daily with a meal., Disp: , Rfl:  .  NOVOLOG 100 UNIT/ML injection, Inject into the skin 3 (three) times daily with meals. 12 units am, 12 units lunch, 14 units supper, Disp: , Rfl:  .  simvastatin (ZOCOR) 20 MG tablet, Take 20 mg by mouth daily., Disp: , Rfl:  .  tamsulosin (FLOMAX) 0.4 MG CAPS capsule, Take 0.4 mg by mouth daily.,  Disp: , Rfl:   Past Medical History: Past Medical History:  Diagnosis Date  . Anemia   . CAD (coronary artery disease)    a. 02/2014 Neg Ex MV, EF 65%;  b. 06/2015 Abnl MV ([facility])-->Cath ([facility]): LM nl, LAD 30p/m, LCX 30p, OM2 50p, RCA 100p w/ L-->R collats, nl EF-->Med Rx.  . Diabetes mellitus, type II, insulin dependent ([place])    With neuropathy  . Diverticulitis   . Essential hypertension   . Hyperlipidemia   . Neuropathy   . Shortness of breath dyspnea   . Syncope and collapse    a. 02/2014 Carotid Dopplers: 1-39% bilateral ICA with normal Subclavian & vertebral arteries; b. 02/2014 2 D Echo: EF 50-55%, Gr 1 DD, Mild MR & LA dilation -- was bradycardic in 40s.    Tobacco Use: Social History   Tobacco Use  Smoking Status Never Smoker  Smokeless Tobacco Never Used    Labs: Recent Review Flowsheet Data    There is no flowsheet data to display.       Exercise Target Goals:    Exercise Program Goal: Individual exercise prescription set using results from initial 6 min walk test and THRR while considering  patient's activity barriers and safety.   Exercise Prescription Goal: Initial exercise prescription builds to 30-45 minutes a  day of aerobic activity, 2-3 days per week.  Home exercise guidelines will be given to patient during program as part of exercise prescription that the participant will acknowledge.  Activity Barriers & Risk Stratification: Activity Barriers & Cardiac Risk Stratification - 05/03/17 1439      Activity Barriers & Cardiac Risk Stratification   Activity Barriers  Back Problems;Shortness of Breath    Cardiac Risk Stratification  High       6 Minute Walk: 6 Minute Walk    Row Name 12/14/16 1501 05/03/17 1421       6 Minute Walk   Distance  1488 feet  1355 feet    Walk Time  6 minutes  6 minutes    # of Rest Breaks  0  0    MPH  2.82  2.56    METS  3.25  2.77    RPE  9  13    Perceived Dyspnea   3  1    VO2 Peak  11.36  9.72     Symptoms  No  Yes (comment)    Comments  -  dizziness    Resting HR  63 bpm  75 bpm    Resting BP  132/64  108/60    Resting Oxygen Saturation   96 %  -    Exercise Oxygen Saturation  during 6 min walk  95 %  97 %    Max Ex. HR  129 bpm  120 bpm    Max Ex. BP  162/64  132/52    2 Minute Post BP  130/60  122/64       Oxygen Initial Assessment:   Oxygen Re-Evaluation:   Oxygen Discharge (Final Oxygen Re-Evaluation):   Initial Exercise Prescription: Initial Exercise Prescription - 05/03/17 1400      Date of Initial Exercise RX and Referring Provider   Date  05/03/17    Referring Provider  [name]      Treadmill   MPH  2.5    Grade  0    Minutes  15    METs  2.77      NuStep   Level  2    SPM  80    Minutes  15    METs  2.7      REL-XR   Level  2    Speed  50    Minutes  15    METs  2.7      Prescription Details   Frequency (times per week)  3    Duration  Progress to 45 minutes of aerobic exercise without signs/symptoms of physical distress      Intensity   THRR 40-80% of Max Heartrate  103-131    Ratings of Perceived Exertion  11-13    Perceived Dyspnea  0-4      Resistance Training   Training Prescription  Yes    Weight  3 lb    Reps  10-15       Perform Capillary Blood Glucose checks as needed.  Exercise Prescription Changes: Exercise Prescription Changes    Row Name 12/26/16 1300 01/24/17 1500 01/31/17 0900 02/05/17 1500 05/03/17 1400     Response to Exercise   Blood Pressure (Admit)  134/70  132/64  -  122/60  108/60   Blood Pressure (Exercise)  134/60  144/52  -  126/74  132/52   Blood Pressure (Exit)  126/54  166/80  -  126/70  122/64   Heart Rate (Admit)  73 bpm  60 bpm  -  65 bpm  75 bpm   Heart Rate (Exercise)  105 bpm  110 bpm  -  107 bpm  120 bpm   Heart Rate (Exit)  68 bpm  70 bpm  -  62 bpm  73 bpm   Rating of Perceived Exertion (Exercise)  13  14  -  12  13   Symptoms  none  none  -  none  -   Comments  first full day of exercise   third full day of exercise  -  -  -   Duration  Progress to 45 minutes of aerobic exercise without signs/symptoms of physical distress  Progress to 45 minutes of aerobic exercise without signs/symptoms of physical distress  -  Continue with 45 min of aerobic exercise without signs/symptoms of physical distress.  -   Intensity  THRR unchanged  THRR unchanged  -  THRR unchanged  -     Progression   Progression  Continue to progress workloads to maintain intensity without signs/symptoms of physical distress.  Continue to progress workloads to maintain intensity without signs/symptoms of physical distress.  -  Continue to progress workloads to maintain intensity without signs/symptoms of physical distress.  -   Average METs  3.22  2.98  -  3.42  -     Resistance Training   Training Prescription  Yes  Yes  -  Yes  -   Weight  3 lb  3 lb  -  3 lb  -   Reps  10-15  10-15  -  10-15  -     Interval Training   Interval Training  No  No  -  No  -     Treadmill   MPH  2.7  2.7  -  2.9  -   Grade  0.5  0.5  -  1  -   Minutes  15  15  -  15  -   METs  3.25  3.25  -  3.62  -     NuStep   Level  3  3  -  3  -   Minutes  15  15  -  15  -   METs  3  4  -  3.4  -     REL-XR   Level  2  2  -  2  -   Minutes  15  15  -  15  -   METs  3.4  1.7  -  3.4  -     Home Exercise Plan   Plans to continue exercise at  -  -  [employer] (comment) walking, treadmill, [employer] (comment) walking, treadmill, [employer]  -   Frequency  -  -  Add 2 additional days to program exercise sessions.  Add 2 additional days to program exercise sessions.  -   Initial Home Exercises Provided  -  -  01/31/17  01/31/17  -      Exercise Comments: Exercise Comments    Row Name 12/22/16 0932 05/07/17 0804         Exercise Comments  First full day of exercise!  Patient was oriented to gym and equipment including functions, settings, policies, and procedures.  Patient's individual exercise prescription and  treatment plan were reviewed.  All starting workloads were established based on the results of the 6 minute walk test done at initial orientation  visit.  The plan for exercise progression was also introduced and progression will be customized based on patient's performance and goals.  First full day of returning to exercise!  Patient was oriented to gym and equipment including functions, settings, policies, and procedures.  Patient's individual exercise prescription and treatment plan were reviewed.  All starting workloads were established based on the results of the 6 minute walk test done at initial orientation visit.  The plan for exercise progression was also introduced and progression will be customized based on patient's performance and goals.         Exercise Goals and Review: Exercise Goals    Row Name 12/14/16 1500             Exercise Goals   Increase Physical Activity  Yes       Intervention  Provide advice, education, support and counseling about physical activity/exercise needs.;Develop an individualized exercise prescription for aerobic and resistive training based on initial evaluation findings, risk stratification, comorbidities and participant's personal goals.       Expected Outcomes  Achievement of increased cardiorespiratory fitness and enhanced flexibility, muscular endurance and strength shown through measurements of functional capacity and personal statement of participant.       Increase Strength and Stamina  Yes       Intervention  Provide advice, education, support and counseling about physical activity/exercise needs.;Develop an individualized exercise prescription for aerobic and resistive training based on initial evaluation findings, risk stratification, comorbidities and participant's personal goals.       Expected Outcomes  Achievement of increased cardiorespiratory fitness and enhanced flexibility, muscular endurance and strength shown through measurements of  functional capacity and personal statement of participant.       Able to understand and use rate of perceived exertion (RPE) scale  Yes       Intervention  Provide education and explanation on how to use RPE scale       Expected Outcomes  Short Term: Able to use RPE daily in rehab to express subjective intensity level;Long Term:  Able to use RPE to guide intensity level when exercising independently       Able to understand and use Dyspnea scale  Yes       Intervention  Provide education and explanation on how to use Dyspnea scale       Expected Outcomes  Short Term: Able to use Dyspnea scale daily in rehab to express subjective sense of shortness of breath during exertion;Long Term: Able to use Dyspnea scale to guide intensity level when exercising independently       Knowledge and understanding of Target Heart Rate Range (THRR)  Yes       Intervention  Provide education and explanation of THRR including how the numbers were predicted and where they are located for reference       Expected Outcomes  Short Term: Able to state/look up THRR;Long Term: Able to use THRR to govern intensity when exercising independently;Short Term: Able to use daily as guideline for intensity in rehab       Able to check pulse independently  Yes       Intervention  Provide education and demonstration on how to check pulse in carotid and radial arteries.;Review the importance of being able to check your own pulse for safety during independent exercise       Expected Outcomes  Short Term: Able to explain why pulse checking is important during independent exercise;Long Term: Able to check pulse independently and  accurately       Understanding of Exercise Prescription  Yes       Intervention  Provide education, explanation, and written materials on patient's individual exercise prescription       Expected Outcomes  Short Term: Able to explain program exercise prescription;Long Term: Able to explain home exercise prescription  to exercise independently          Exercise Goals Re-Evaluation : Exercise Goals Re-Evaluation    Row Name 12/22/16 6294 01/10/17 1511 01/24/17 1528 01/31/17 0847 02/05/17 1537     Exercise Goal Re-Evaluation   Exercise Goals Review  Understanding of Exercise Prescription;Knowledge and understanding of Target Heart Rate Range (THRR);Able to understand and use rate of perceived exertion (RPE) scale  -  Increase Physical Activity;Increase Strength and Stamina;Understanding of Exercise Prescription  Increase Physical Activity;Increase Strength and Stamina;Understanding of Exercise Prescription  Increase Physical Activity;Increase Strength and Stamina;Understanding of Exercise Prescription   Comments  Reviewed RPE scale, THR and program prescription with pt today.  Pt voiced understanding and was given a copy of goals to take home.   After some dermatology surgery, Ronald Wallace's doctor requested that he stay out until after the new year.   Ronald Wallace is off to a good start in rehab.  He has had offically three full days of exercise due to his surgery.  He was able to return at his current workloads without a problem.  We will start to increase his workloads and continue to monitor his progress.   Ronald Wallace is doing well in rehab. He is feeling stronger and has more stamina.   Currently, he has been walking some at home.  Reviewed home exercise with pt today.  Pt plans to walking, treadmill, and returning to [facility]  for exercise.  Reviewed THR, pulse, RPE, sign and symptoms, and when to call 911 or MD.  Also discussed weather considerations and indoor options.  Pt voiced understanding.  Ronald Wallace has been doing well in rehab.  He is now up to 3.4 METs on both the XR and NuStep.  We will continue to monitor his progression.    Expected Outcomes  Short: Use RPE daily to regulate intensity.  Long: Follow program prescription in THR.  -  Short: Increase incline on treadmill and review home exercise.  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   Short: Return to [facility] for exercise.  Long: Continue to increase strength and stamina.   Short: Increase workloads on XR and NuStep.  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    [place] Name 02/20/17 1519 03/21/17 1439 05/07/17 0804         Exercise Goal Re-Evaluation   Exercise Goals Review  -  -  Increase Physical Activity;Increase Strength and Stamina;Able to understand and use rate of perceived exertion (RPE) scale;Understanding of Exercise Prescription     Comments  Out since last review.  Out since last review.  Reviewed RPE scale, THR and program prescription with pt today.  Pt voiced understanding and was given a copy of goals to take home.      Expected Outcomes  -  -  Short: Use RPE daily to regulate intensity.  Long: Follow program prescription in THR.        Discharge Exercise Prescription (Final Exercise Prescription Changes): Exercise Prescription Changes - 05/03/17 1400      Response to Exercise   Blood Pressure (Admit)  108/60    Blood Pressure (Exercise)  132/52  Blood Pressure (Exit)  122/64    Heart Rate (Admit)  75 bpm    Heart Rate (Exercise)  120 bpm    Heart Rate (Exit)  73 bpm    Rating of Perceived Exertion (Exercise)  13       Nutrition:  Target Goals: Understanding of nutrition guidelines, daily intake of sodium <1534m, cholesterol <2080m calories 30% from fat and 7% or less from saturated fats, daily to have 5 or more servings of fruits and vegetables.  Biometrics: Pre Biometrics - 05/03/17 1420      Pre Biometrics   Height  5' 7.75" (1.721 m)    Weight  197 lb 4.8 oz (89.5 kg)    Waist Circumference  44 inches    Hip Circumference  39 inches    Waist to Hip Ratio  1.13 %    BMI (Calculated)  30.22    Single Leg Stand  1.27 seconds        Nutrition Therapy Plan and Nutrition Goals: Nutrition Therapy & Goals - 05/03/17 1441      Intervention Plan   Intervention  Prescribe, educate  and counsel regarding individualized specific dietary modifications aiming towards targeted core components such as weight, hypertension, lipid management, diabetes, heart failure and other comorbidities.    Expected Outcomes  Short Term Goal: Understand basic principles of dietary content, such as calories, fat, sodium, cholesterol and nutrients.;Short Term Goal: A plan has been developed with personal nutrition goals set during dietitian appointment.;Long Term Goal: Adherence to prescribed nutrition plan.       Nutrition Assessments: Nutrition Assessments - 05/03/17 1441      MEDFICTS Scores   Pre Score  52       Nutrition Goals Re-Evaluation: Nutrition Goals Re-Evaluation    Row Name 01/31/17 0851             Goals   Nutrition Goal  Meet with nutritionist on 1/30 during class       Comment  HaJoneen Boersould like to meet with nutrtionist during class as lives is [employer] He would like to just hear some tips for weight loss and how to cut back on his snacking.        Expected Outcome  Short: Meet with dietician  Long: Cut back on snacking.           Nutrition Goals Discharge (Final Nutrition Goals Re-Evaluation): Nutrition Goals Re-Evaluation - 01/31/17 0851      Goals   Nutrition Goal  Meet with nutritionist on 1/30 during class    Comment  HaJoneen Boersould like to meet with nutrtionist during class as lives is [employer] He would like to just hear some tips for weight loss and how to cut back on his snacking.     Expected Outcome  Short: Meet with dietician  Long: Cut back on snacking.        Psychosocial: Target Goals: Acknowledge presence or absence of significant depression and/or stress, maximize coping skills, provide positive support system. Participant is able to verbalize types and ability to use techniques and skills needed for reducing stress and depression.   Initial Review & Psychosocial Screening: Initial Psych Review & Screening - 05/03/17 1444       Initial Review   Current issues with  Current Stress Concerns    Source of Stress Concerns  Unable to perform yard/household activities;Family    Comments  HAJoneen Boersas a married child and has stress about this family unit  Family Dynamics   Good Support System?  Yes      Barriers   Psychosocial barriers to participate in program  There are no identifiable barriers or psychosocial needs.;The patient should benefit from training in stress management and relaxation.      Screening Interventions   Interventions  Encouraged to exercise;Provide feedback about the scores to participant    Expected Outcomes  Short Term goal: Utilizing psychosocial counselor, staff and physician to assist with identification of specific Stressors or current issues interfering with healing process. Setting desired goal for each stressor or current issue identified.;Long Term Goal: Stressors or current issues are controlled or eliminated.;Short Term goal: Identification and review with participant of any Quality of Life or Depression concerns found by scoring the questionnaire.;Long Term goal: The participant improves quality of Life and PHQ9 Scores as seen by post scores and/or verbalization of changes       Quality of Life Scores:  Quality of Life - 05/03/17 1445      Quality of Life Scores   Health/Function Pre  18.37 %    Socioeconomic Pre  23.71 %    Psych/Spiritual Pre  24.43 %    Family Pre  22.8 %    GLOBAL Pre  18.37 %      Scores of 19 and below usually indicate a poorer quality of life in these areas.  A difference of  2-3 points is a clinically meaningful difference.  A difference of 2-3 points in the total score of the Quality of Life Index has been associated with significant improvement in overall quality of life, self-image, physical symptoms, and general health in studies assessing change in quality of life.  PHQ-9: Recent Review Flowsheet Data    Depression screen [facility] 2/9 05/03/2017 01/24/2017  12/14/2016 12/14/2016   Decreased Interest 2 1 - 0   Down, Depressed, Hopeless 0 1 - 2   PHQ - 2 Score 2 2 - 2   Altered sleeping 0 0 - 3   Tired, decreased energy 2 1 - 2   Change in appetite 0 0 - 0   Feeling bad or failure about yourself  2 0 - 1   Trouble concentrating 0 0 - 3   Moving slowly or fidgety/restless 0 0 - 0   Suicidal thoughts 0 0 0  1   PHQ-9 Score 6 3 - 12   Difficult doing work/chores Somewhat difficult Not difficult at all - -     Interpretation of Total Score  Total Score Depression Severity:  1-4 = Minimal depression, 5-9 = Mild depression, 10-14 = Moderate depression, 15-19 = Moderately severe depression, 20-27 = Severe depression   Psychosocial Evaluation and Intervention: Psychosocial Evaluation - 05/07/17 0950      Psychosocial Evaluation & Interventions   Interventions  Stress management education;Encouraged to exercise with the program and follow exercise prescription    Comments  Ronald Wallace has returned to this program.  His Dr. encouraged he discontinue earlier due to shingles outbreak.  Ronald Wallace is a 77 year old who has a strong support system.  He continues to sleep well and has a good appetite.  Ronald Wallace admits to some depression or anxiety at times and his mood is "okay" most of the time.  He admits to stress and concern with his daughter not spending any time with her twins - leaving that responsibility primarily to [place] and his wife.  counselor encouraged him to set healthy limits and shared strategies on this.  Ronald Wallace  has goals to lose some weight and decrease some soreness in his body due to a fall he experienced 20+ years ago.  Staff will follow with him.     Expected Outcomes  Short - Ronald Wallace will meet with the dietician to address his weight loss goals.  He will also begin to set healthy limits with his daughter to alleviate his concerns.  Long - Ronald Wallace will exercise more consistenly for his heart health and to help manage his stress more positively.       Continue Psychosocial Services   Follow up required by staff       Psychosocial Re-Evaluation: Psychosocial Re-Evaluation    [place] Name 01/31/17 [phone]             Psychosocial Re-Evaluation   Current issues with  Current Stress Concerns       Comments  Ronald Wallace has been doing well in rehab.  He remains postive overall.  He and his son are doing better and he's doing better too.  His son is out in [place] skiing, but called him on Sunday.  He continues to sleep well.  He has found that exercise helps him feel better and he is not as sore any more.        Expected Outcomes  Short: Continue to attend rehab and reduce soreness.  Long: Continue to maintain positive attitude.           Psychosocial Discharge (Final Psychosocial Re-Evaluation): Psychosocial Re-Evaluation - 01/31/17 0855      Psychosocial Re-Evaluation   Current issues with  Current Stress Concerns    Comments  Ronald Wallace has been doing well in rehab.  He remains postive overall.  He and his son are doing better and he's doing better too.  His son is out in [place] skiing, but called him on Sunday.  He continues to sleep well.  He has found that exercise helps him feel better and he is not as sore any more.     Expected Outcomes  Short: Continue to attend rehab and reduce soreness.  Long: Continue to maintain positive attitude.        Vocational Rehabilitation: Provide vocational rehab assistance to qualifying candidates.   Vocational Rehab Evaluation & Intervention: Vocational Rehab - 05/03/17 1452      Initial Vocational Rehab Evaluation & Intervention   Assessment shows need for Vocational Rehabilitation  No       Education: Education Goals: Education classes will be provided on a variety of topics geared toward better understanding of heart health and risk factor modification. Participant will state understanding/return demonstration of topics presented as noted by education test scores.  Learning  Barriers/Preferences: Learning Barriers/Preferences - 05/03/17 1451      Learning Barriers/Preferences   Learning Barriers  None    Learning Preferences  None       Education Topics:  AED/CPR: - Group verbal and written instruction with the use of models to demonstrate the basic use of the AED with the basic ABC's of resuscitation.   General Nutrition Guidelines/Fats and Fiber: -Group instruction provided by verbal, written material, models and posters to present the general guidelines for heart healthy nutrition. Gives an explanation and review of dietary fats and fiber.   Controlling Sodium/Reading Food Labels: -Group verbal and written material supporting the discussion of sodium use in heart healthy nutrition. Review and explanation with models, verbal and written materials for utilization of the food label.   Exercise Physiology & General Exercise Guidelines: - Group  verbal and written instruction with models to review the exercise physiology of the cardiovascular system and associated critical values. Provides general exercise guidelines with specific guidelines to those with heart or lung disease.    Aerobic Exercise & Resistance Training: - Gives group verbal and written instruction on the various components of exercise. Focuses on aerobic and resistive training programs and the benefits of this training and how to safely progress through these programs..   Flexibility, Balance, Mind/Body Relaxation: Provides group verbal/written instruction on the benefits of flexibility and balance training, including mind/body exercise modes such as yoga, pilates and tai chi.  Demonstration and skill practice provided.   Cardiac Rehab from 05/07/2017 in [facility] Cardiac and Pulmonary Rehab  Date  05/07/17  Educator  [facility]  Instruction Review Code  1- Verbalizes Understanding      Stress and Anxiety: - Provides group verbal and written instruction about the health risks of elevated stress  and causes of high stress.  Discuss the correlation between heart/lung disease and anxiety and treatment options. Review healthy ways to manage with stress and anxiety.   Depression: - Provides group verbal and written instruction on the correlation between heart/lung disease and depressed mood, treatment options, and the stigmas associated with seeking treatment.   Anatomy & Physiology of the Heart: - Group verbal and written instruction and models provide basic cardiac anatomy and physiology, with the coronary electrical and arterial systems. Review of Valvular disease and Heart Failure   Cardiac Procedures: - Group verbal and written instruction to review commonly prescribed medications for heart disease. Reviews the medication, class of the drug, and side effects. Includes the steps to properly store meds and maintain the prescription regimen. (beta blockers and nitrates)   Cardiac Medications I: - Group verbal and written instruction to review commonly prescribed medications for heart disease. Reviews the medication, class of the drug, and side effects. Includes the steps to properly store meds and maintain the prescription regimen.   Cardiac Rehab from 05/07/2017 in [facility] Cardiac and Pulmonary Rehab  Date  01/29/17  Educator  [place]  Instruction Review Code  1- Verbalizes Understanding      Cardiac Medications II: -Group verbal and written instruction to review commonly prescribed medications for heart disease. Reviews the medication, class of the drug, and side effects. (all other drug classes)    Go Sex-Intimacy & Heart Disease, Get SMART - Goal Setting: - Group verbal and written instruction through game format to discuss heart disease and the return to sexual intimacy. Provides group verbal and written material to discuss and apply goal setting through the application of the S.M.A.R.T. Method.   Other Matters of the Heart: - Provides group verbal, written materials and models to  describe Stable Angina and Peripheral Artery. Includes description of the disease process and treatment options available to the cardiac patient.   Exercise & Equipment Safety: - Individual verbal instruction and demonstration of equipment use and safety with use of the equipment.   Cardiac Rehab from 05/07/2017 in [facility] Cardiac and Pulmonary Rehab  Date  01/22/17  Educator  [facility]  Instruction Review Code  1- Verbalizes Understanding      Infection Prevention: - Provides verbal and written material to individual with discussion of infection control including proper hand washing and proper equipment cleaning during exercise session.   Cardiac Rehab from 05/07/2017 in [facility] Cardiac and Pulmonary Rehab  Date  01/22/17  Educator  [facility]  Instruction Review Code  1- Verbalizes Understanding  Falls Prevention: - Provides verbal and written material to individual with discussion of falls prevention and safety.   Diabetes: - Individual verbal and written instruction to review signs/symptoms of diabetes, desired ranges of glucose level fasting, after meals and with exercise. Acknowledge that pre and post exercise glucose checks will be done for 3 sessions at entry of program.   Know Your Numbers and Risk Factors: -Group verbal and written instruction about important numbers in your health.  Discussion of what are risk factors and how they play a role in the disease process.  Review of Cholesterol, Blood Pressure, Diabetes, and BMI and the role they play in your overall health.   Sleep Hygiene: -Provides group verbal and written instruction about how sleep can affect your health.  Define sleep hygiene, discuss sleep cycles and impact of sleep habits. Review good sleep hygiene tips.    Other: -Provides group and verbal instruction on various topics (see comments)   Cardiac Rehab from 05/07/2017 in [facility] Cardiac and Pulmonary Rehab  Date  01/24/17  Educator  [facility]  Instruction Review Code  1-  Verbalizes Understanding [Risk Factors and Know Your Numbers]      Knowledge Questionnaire Score: Knowledge Questionnaire Score - 05/03/17 1452      Knowledge Questionnaire Score   Pre Score  21/28 REviewed correct responses with Ronald Wallace today. He verbalized understanding and had no further questions       Core Components/Risk Factors/Patient Goals at Admission: Personal Goals and Risk Factors at Admission - 05/03/17 1441      Core Components/Risk Factors/Patient Goals on Admission   Intervention  Weight Management: Develop a combined nutrition and exercise program designed to reach desired caloric intake, while maintaining appropriate intake of nutrient and fiber, sodium and fats, and appropriate energy expenditure required for the weight goal.;Weight Management/Obesity: Establish reasonable short term and long term weight goals.;Obesity: Provide education and appropriate resources to help participant work on and attain dietary goals.    Admit Weight  197 lb 4.8 oz (89.5 kg)    Goal Weight: Short Term  195 lb (88.5 kg)    Goal Weight: Long Term  175 lb (79.4 kg)    Expected Outcomes  Short Term: Continue to assess and modify interventions until short term weight is achieved;Weight Loss: Understanding of general recommendations for a balanced deficit meal plan, which promotes 1-2 lb weight loss per week and includes a negative energy balance of [phone] kcal/d;Understanding of distribution of calorie intake throughout the day with the consumption of 4-5 meals/snacks;Understanding recommendations for meals to include 15-35% energy as protein, 25-35% energy from fat, 35-60% energy from carbohydrates, less than 271m of dietary cholesterol, 20-35 gm of total fiber daily    Improve shortness of breath with ADL's  Yes    Intervention  Provide education, individualized exercise plan and daily activity instruction to help decrease symptoms of SOB with activities of daily living.    Expected Outcomes   Short Term: Improve cardiorespiratory fitness to achieve a reduction of symptoms when performing ADLs;Long Term: Be able to perform more ADLs without symptoms or delay the onset of symptoms    Diabetes  Yes    Intervention  Provide education about signs/symptoms and action to take for hypo/hyperglycemia.;Provide education about proper nutrition, including hydration, and aerobic/resistive exercise prescription along with prescribed medications to achieve blood glucose in normal ranges: Fasting glucose 65-99 mg/dL    Expected Outcomes  Short Term: Participant verbalizes understanding of the signs/symptoms and immediate care of hyper/hypoglycemia, proper  foot care and importance of medication, aerobic/resistive exercise and nutrition plan for blood glucose control.;Long Term: Attainment of HbA1C < 7%.    Hypertension  Yes    Intervention  Provide education on lifestyle modifcations including regular physical activity/exercise, weight management, moderate sodium restriction and increased consumption of fresh fruit, vegetables, and low fat dairy, alcohol moderation, and smoking cessation.;Monitor prescription use compliance.    Expected Outcomes  Short Term: Continued assessment and intervention until BP is < 140/106m HG in hypertensive participants. < 130/826mHG in hypertensive participants with diabetes, heart failure or chronic kidney disease.;Long Term: Maintenance of blood pressure at goal levels.    Lipids  Yes    Intervention  Provide education and support for participant on nutrition & aerobic/resistive exercise along with prescribed medications to achieve LDL <7081mHDL >67m10m  Expected Outcomes  Short Term: Participant states understanding of desired cholesterol values and is compliant with medications prescribed. Participant is following exercise prescription and nutrition guidelines.;Long Term: Cholesterol controlled with medications as prescribed, with individualized exercise RX and with  personalized nutrition plan. Value goals: LDL < 70mg36mL > 40 mg.    Stress  Yes Family stress is a concern for [employer] Intervention  Offer individual and/or small group education and counseling on adjustment to heart disease, stress management and health-related lifestyle change. Teach and support self-help strategies.;Refer participants experiencing significant psychosocial distress to appropriate mental health specialists for further evaluation and treatment. When possible, include family members and significant others in education/counseling sessions.    Expected Outcomes  Short Term: Participant demonstrates changes in health-related behavior, relaxation and other stress management skills, ability to obtain effective social support, and compliance with psychotropic medications if prescribed.;Long Term: Emotional wellbeing is indicated by absence of clinically significant psychosocial distress or social isolation.       Core Components/Risk Factors/Patient Goals Review:  Goals and Risk Factor Review    Row Name 01/31/17 0847             Core Components/Risk Factors/Patient Goals Review   Personal Goals Review  Weight Management/Obesity;Hypertension;Diabetes;Lipids;Heart Failure       Review  HarolJoneen Boersbeen doing well in rehab.  He noticed that his weight was up a little today and admitted to eating more since his  wife was out.  He weighs daily to monitor his heart failure and has not had any symptoms.  He also monitors his salt intake.  His blood sugars have been pretty good on average. He has been watching what he eats.  He has noticed that his sugars are better when he exercises.  His wife is also diabetic and they work together to monitor their sugars.  His blood pressures have been pretty good, he checks them about  every other day.  He feels that his medications are working for him.        Expected Outcomes  Short: Continue to work on weight loss.  Long: Contiue to manage diabetes.            Core Components/Risk Factors/Patient Goals at Discharge (Final Review):  Goals and Risk Factor Review - 01/31/17 0847      Core Components/Risk Factors/Patient Goals Review   Personal Goals Review  Weight Management/Obesity;Hypertension;Diabetes;Lipids;Heart Failure    Review  HarolJoneen Boersbeen doing well in rehab.  He noticed that his weight was up a little today and admitted to eating more since his  wife was out.  He weighs daily to monitor  his heart failure and has not had any symptoms.  He also monitors his salt intake.  His blood sugars have been pretty good on average. He has been watching what he eats.  He has noticed that his sugars are better when he exercises.  His wife is also diabetic and they work together to monitor their sugars.  His blood pressures have been pretty good, he checks them about  every other day.  He feels that his medications are working for him.     Expected Outcomes  Short: Continue to work on weight loss.  Long: Contiue to manage diabetes.        ITP Comments: ITP Comments    Row Name 12/14/16 1321 12/14/16 1324 12/20/16 0545 01/10/17 1510 01/17/17 0630   ITP Comments  ITP created during Medical Review/Orientation appt after Cardiac REhab informed consent was signed.   Stable angina note documented in Dr. Donivan Scull 11/27/2016 note  30 day review. Continue with ITP unless directed changes per Medical Director review.   After some dermatology surgery, Ronald Wallace's doctor requested that he stay out until after the new year.   30 day review. Continue with ITP unless directed changes per Medical Director review.   1 visit since last review   Row Name 02/08/17 1123 02/14/17 0557 02/20/17 1519 03/05/17 1551 03/14/17 0642   ITP Comments  Ronald Wallace called to let us know that Ronald Wallace has been out with shingles in his ear.  We are going to give a couple weeks to see if he gets any better.   30 Day review. Continue with ITP unless directed changes per Medical Director  review.   Called to check on Ronald Wallace with his Shingles. He is still taking his medications and has some open wounds.  Talked about making sure he finishes his medications and all wounds are sealed before he is able to return to rehab.   Called to check on status of return.  He continues to have pain from Shingles.  The rash has improved per office notes but he says the pain is still in his back.  We will continue to touch base with him about his progress.  30 day review completed. Continue with ITP unless diercted changes per Medical Director. Remains out this month with medical reason   Row Name 03/21/17 1439 04/02/17 1500 05/03/17 1432 05/09/17 0607     ITP Comments  Called to check on pt status to return to rehab.  Spoke with his wife.  His wounds have all dried up and scabbed over.  He hopes to return on Monday.   Ronald Wallace called this morning with questions about whether or not he should return.  He has finally recovered from his shingles, but now is experiencing a lot of pain.  We are going to discharge him at this time as he has been out for almost two months and have him return under a new order and new walk test to finish the program.   Ronald Wallace returned to day to resume his cardiac rehab after discharging for lengthy time out because of medicl reason. ITP created and will be sent to Dr Loleta Chance for review, changes as needed and signature. Documentation of diagnosis can be found in [facility] encounter11/02/2016  30 day review. Continue with ITP unless directed changes per Medical Director       Comments:

## 2017-05-09 NOTE — Progress Notes (Signed)
Daily Session Note  Patient Details  Name: Ronald Wallace MRN: [id number] Date of Birth: 04-26-1940 Referring Provider:     Cardiac Rehab from 05/03/2017 in [facility] Cardiac and Pulmonary Rehab  Referring Provider  [name]      Encounter Date: 05/09/2017  Check In: Session Check In - 05/09/17 0757      Check-In   Location  [facility]-Cardiac & Pulmonary Rehab    Staff Present  Justin Mend RCP,RRT,BSRT;Heath Lark, RN, BSN, CCRP;Jessica Luan Pulling, MA, RCEP, CCRP, Exercise Physiologist    Supervising physician immediately available to respond to emergencies  See telemetry face sheet for immediately available ER MD    Medication changes reported      No    Fall or balance concerns reported     No    Tobacco Cessation  No Change    Warm-up and Cool-down  Performed on first and last piece of equipment    Resistance Training Performed  Yes    VAD Patient?  No      Pain Assessment   Currently in Pain?  No/denies          Social History   Tobacco Use  Smoking Status Never Smoker  Smokeless Tobacco Never Used    Goals Met:  Independence with exercise equipment Exercise tolerated well No report of cardiac concerns or symptoms Strength training completed today  Goals Unmet:  Not Applicable  Comments: Pt able to follow exercise prescription today without complaint.  Will continue to monitor for progression.   Dr. Emily Filbert is Medical Director for [place] and [facility].

## 2017-05-11 ENCOUNTER — Encounter: Payer: PPO | Admitting: *Deleted

## 2017-05-11 DIAGNOSIS — I208 Other forms of angina pectoris: Secondary | ICD-10-CM

## 2017-05-11 DIAGNOSIS — I209 Angina pectoris, unspecified: Secondary | ICD-10-CM | POA: Diagnosis not present

## 2017-05-11 NOTE — Progress Notes (Signed)
**Note Ronald-Identified via Obfuscation** Daily Session Note  Patient Details  Name: Ronald Wallace MRN: [id number] Date of Birth: Sep 26, 1940 Referring Provider:     Cardiac Rehab from 05/03/2017 in [facility] Cardiac and Pulmonary Rehab  Referring Provider  [name]      Encounter Date: 05/11/2017  Check In: Session Check In - 05/11/17 0824      Check-In   Location  [facility]-Cardiac & Pulmonary Rehab    Staff Present  Alberteen Sam, MA, RCEP, CCRP, Exercise Physiologist;Meredith Sherryll Burger, RN Vickki Hearing, BA, ACSM CEP, Exercise Physiologist    Supervising physician immediately available to respond to emergencies  See telemetry face sheet for immediately available ER MD    Medication changes reported      No    Fall or balance concerns reported     No    Warm-up and Cool-down  Performed on first and last piece of equipment    Resistance Training Performed  Yes    VAD Patient?  No      Pain Assessment   Currently in Pain?  No/denies          Social History   Tobacco Use  Smoking Status Never Smoker  Smokeless Tobacco Never Used    Goals Met:  Independence with exercise equipment Exercise tolerated well No report of cardiac concerns or symptoms Strength training completed today  Goals Unmet:  Not Applicable  Comments: Pt able to follow exercise prescription today without complaint.  Will continue to monitor for progression.    Dr. Emily Filbert is Medical Director for [place] and [facility].

## 2017-05-14 ENCOUNTER — Encounter: Payer: PPO | Admitting: *Deleted

## 2017-05-14 DIAGNOSIS — I208 Other forms of angina pectoris: Secondary | ICD-10-CM

## 2017-05-14 DIAGNOSIS — I209 Angina pectoris, unspecified: Secondary | ICD-10-CM | POA: Diagnosis not present

## 2017-05-14 NOTE — Progress Notes (Signed)
Daily Session Note  Patient Details  Name: Ronald Wallace MRN: [id number] Date of Birth: February 08, 1940 Referring Provider:     Cardiac Rehab from 05/03/2017 in [facility] Cardiac and Pulmonary Rehab  Referring Provider  [name]      Encounter Date: 05/14/2017  Check In: Session Check In - 05/14/17 0746      Check-In   Location  [facility]-Cardiac & Pulmonary Rehab    Staff Present  Alberteen Sam, MA, RCEP, CCRP, Exercise Physiologist;Susanne Bice, RN, BSN, Laveda Norman, BS, ACSM CEP, Exercise Physiologist    Supervising physician immediately available to respond to emergencies  See telemetry face sheet for immediately available ER MD    Medication changes reported      No    Fall or balance concerns reported     No    Warm-up and Cool-down  Performed on first and last piece of equipment    Resistance Training Performed  Yes    VAD Patient?  No      Pain Assessment   Currently in Pain?  No/denies          Social History   Tobacco Use  Smoking Status Never Smoker  Smokeless Tobacco Never Used    Goals Met:  Independence with exercise equipment Exercise tolerated well No report of cardiac concerns or symptoms Strength training completed today  Goals Unmet:  Not Applicable  Comments: Pt able to follow exercise prescription today without complaint.  Will continue to monitor for progression.    Dr. Emily Filbert is Medical Director for [place] and [facility].

## 2017-05-16 ENCOUNTER — Encounter: Payer: PPO | Attending: Cardiovascular Disease

## 2017-05-16 DIAGNOSIS — I208 Other forms of angina pectoris: Secondary | ICD-10-CM

## 2017-05-16 DIAGNOSIS — I209 Angina pectoris, unspecified: Secondary | ICD-10-CM | POA: Diagnosis not present

## 2017-05-16 LAB — GLUCOSE, CAPILLARY
Glucose-Capillary: 208 mg/dL — ABNORMAL HIGH (ref 65–99)
Glucose-Capillary: 146 mg/dL — ABNORMAL HIGH (ref 65–99)

## 2017-05-16 NOTE — Progress Notes (Signed)
Daily Session Note  Patient Details  Name: Ronald Wallace MRN: [id number] Date of Birth: Apr 04, 1940 Referring Provider:     Cardiac Rehab from 05/03/2017 in [facility] Cardiac and Pulmonary Rehab  Referring Provider  [name]      Encounter Date: 05/16/2017  Check In: Session Check In - 05/16/17 0728      Check-In   Location  [facility]-Cardiac & Pulmonary Rehab    Staff Present  Justin Mend RCP,RRT,BSRT;Heath Lark, RN, BSN, CCRP;Jessica Luan Pulling, MA, RCEP, CCRP, Exercise Physiologist    Supervising physician immediately available to respond to emergencies  See telemetry face sheet for immediately available ER MD    Medication changes reported      No    Fall or balance concerns reported     No    Tobacco Cessation  No Change    Warm-up and Cool-down  Performed on first and last piece of equipment    Resistance Training Performed  Yes    VAD Patient?  No      Pain Assessment   Currently in Pain?  No/denies        Exercise Prescription Changes - 05/15/17 1100      Response to Exercise   Blood Pressure (Admit)  140/80    Blood Pressure (Exercise)  138/74    Blood Pressure (Exit)  126/70    Heart Rate (Admit)  70 bpm    Heart Rate (Exercise)  94 bpm    Heart Rate (Exit)  68 bpm    Rating of Perceived Exertion (Exercise)  13    Symptoms  none    Duration  Continue with 45 min of aerobic exercise without signs/symptoms of physical distress.    Intensity  THRR unchanged      Progression   Progression  Continue to progress workloads to maintain intensity without signs/symptoms of physical distress.    Average METs  3.17      Resistance Training   Training Prescription  Yes    Weight  3 lb    Reps  10-15      Interval Training   Interval Training  No      Treadmill   MPH  2.5    Grade  0    Minutes  15    METs  2.77      NuStep   Level  2    Minutes  15    METs  3      REL-XR   Level  2    Minutes  15    METs  4.3      Home Exercise Plan   Plans to continue  exercise at  [employer] (comment) walking, treadmill, [employer]    Frequency  Add 2 additional days to program exercise sessions.    Initial Home Exercises Provided  01/31/17       Social History   Tobacco Use  Smoking Status Never Smoker  Smokeless Tobacco Never Used    Goals Met:  Independence with exercise equipment Exercise tolerated well No report of cardiac concerns or symptoms Strength training completed today  Goals Unmet:  Not Applicable  Comments: Pt able to follow exercise prescription today without complaint.  Will continue to monitor for progression.   Dr. Emily Filbert is Medical Director for [place] and [facility].

## 2017-05-17 DIAGNOSIS — L905 Scar conditions and fibrosis of skin: Secondary | ICD-10-CM | POA: Diagnosis not present

## 2017-05-17 DIAGNOSIS — D049 Carcinoma in situ of skin, unspecified: Secondary | ICD-10-CM | POA: Diagnosis not present

## 2017-05-17 DIAGNOSIS — D485 Neoplasm of uncertain behavior of skin: Secondary | ICD-10-CM | POA: Diagnosis not present

## 2017-05-17 DIAGNOSIS — D044 Carcinoma in situ of skin of scalp and neck: Secondary | ICD-10-CM | POA: Diagnosis not present

## 2017-05-18 ENCOUNTER — Encounter: Payer: PPO | Admitting: *Deleted

## 2017-05-18 DIAGNOSIS — I208 Other forms of angina pectoris: Secondary | ICD-10-CM

## 2017-05-18 DIAGNOSIS — I209 Angina pectoris, unspecified: Secondary | ICD-10-CM | POA: Diagnosis not present

## 2017-05-18 NOTE — Progress Notes (Signed)
Daily Session Note  Patient Details  Name: Ronald Wallace MRN: [id number] Date of Birth: 07-11-1940 Referring Provider:     Cardiac Rehab from 05/03/2017 in [facility] Cardiac and Pulmonary Rehab  Referring Provider  [name]      Encounter Date: 05/18/2017  Check In: Session Check In - 05/18/17 0953      Check-In   Location  [facility]-Cardiac & Pulmonary Rehab    Staff Present  Renita Papa, RN BSN;Aleiyah Halpin Luan Pulling, MA, RCEP, CCRP, Exercise Physiologist;Amanda Oletta Darter, [place], ACSM CEP, Exercise Physiologist    Supervising physician immediately available to respond to emergencies  See telemetry face sheet for immediately available ER MD    Medication changes reported      No    Fall or balance concerns reported     No    Warm-up and Cool-down  Performed on first and last piece of equipment    Resistance Training Performed  Yes    VAD Patient?  No      Pain Assessment   Currently in Pain?  No/denies          Social History   Tobacco Use  Smoking Status Never Smoker  Smokeless Tobacco Never Used    Goals Met:  Independence with exercise equipment Exercise tolerated well No report of cardiac concerns or symptoms Strength training completed today  Goals Unmet:  Not Applicable  Comments: Pt able to follow exercise prescription today without complaint.  Will continue to monitor for progression.    Dr. Emily Filbert is Medical Director for [place] and [facility].

## 2017-05-21 ENCOUNTER — Encounter: Payer: PPO | Admitting: *Deleted

## 2017-05-21 DIAGNOSIS — I209 Angina pectoris, unspecified: Secondary | ICD-10-CM | POA: Diagnosis not present

## 2017-05-21 DIAGNOSIS — I208 Other forms of angina pectoris: Secondary | ICD-10-CM

## 2017-05-21 NOTE — Progress Notes (Signed)
Daily Session Note  Patient Details  Name: Ronald Wallace MRN: [id number] Date of Birth: [birth year] Referring Provider:     Cardiac Rehab from 05/03/2017 in [facility] Cardiac and Pulmonary Rehab  Referring Provider  [name]      Encounter Date: 05/21/2017  Check In: Session Check In - 05/21/17 0758      Check-In   Location  [facility]-Cardiac & Pulmonary Rehab    Staff Present  Heath Lark, RN, BSN, CCRP;Keiron Iodice Luan Pulling, MA, RCEP, CCRP, Exercise Physiologist;Kelly Amedeo Plenty, BS, ACSM CEP, Exercise Physiologist    Supervising physician immediately available to respond to emergencies  See telemetry face sheet for immediately available ER MD    Medication changes reported      No    Fall or balance concerns reported     No    Warm-up and Cool-down  Performed on first and last piece of equipment    Resistance Training Performed  Yes    VAD Patient?  No      Pain Assessment   Currently in Pain?  No/denies          Social History   Tobacco Use  Smoking Status Never Smoker  Smokeless Tobacco Never Used    Goals Met:  Independence with exercise equipment Exercise tolerated well No report of cardiac concerns or symptoms Strength training completed today  Goals Unmet:  Not Applicable  Comments: Pt able to follow exercise prescription today without complaint.  Will continue to monitor for progression.    Dr. Emily Filbert is Medical Director for [place] and [facility].

## 2017-05-22 DIAGNOSIS — E785 Hyperlipidemia, unspecified: Secondary | ICD-10-CM | POA: Diagnosis not present

## 2017-05-22 DIAGNOSIS — E1159 Type 2 diabetes mellitus with other circulatory complications: Secondary | ICD-10-CM | POA: Diagnosis not present

## 2017-05-22 DIAGNOSIS — E1169 Type 2 diabetes mellitus with other specified complication: Secondary | ICD-10-CM | POA: Diagnosis not present

## 2017-05-22 DIAGNOSIS — I10 Essential (primary) hypertension: Secondary | ICD-10-CM | POA: Diagnosis not present

## 2017-05-22 DIAGNOSIS — E1142 Type 2 diabetes mellitus with diabetic polyneuropathy: Secondary | ICD-10-CM | POA: Diagnosis not present

## 2017-05-22 DIAGNOSIS — Z794 Long term (current) use of insulin: Secondary | ICD-10-CM | POA: Diagnosis not present

## 2017-05-23 DIAGNOSIS — I208 Other forms of angina pectoris: Secondary | ICD-10-CM

## 2017-05-23 DIAGNOSIS — I209 Angina pectoris, unspecified: Secondary | ICD-10-CM | POA: Diagnosis not present

## 2017-05-23 NOTE — Progress Notes (Signed)
Daily Session Note  Patient Details  Name: Ronald Wallace MRN: [id number] Date of Birth: May 03, 1940 Referring Provider:     Cardiac Rehab from 05/03/2017 in [facility] Cardiac and Pulmonary Rehab  Referring Provider  [name]      Encounter Date: 05/23/2017  Check In: Session Check In - 05/23/17 0757      Check-In   Location  [facility]-Cardiac & Pulmonary Rehab    Staff Present  Alberteen Sam, MA, RCEP, CCRP, Exercise Physiologist;Dondra Rhett [place];Heath Lark, RN, BSN, CCRP    Supervising physician immediately available to respond to emergencies  See telemetry face sheet for immediately available ER MD    Medication changes reported      No    Fall or balance concerns reported     No    Tobacco Cessation  No Change    Warm-up and Cool-down  Performed on first and last piece of equipment    Resistance Training Performed  Yes    VAD Patient?  No      Pain Assessment   Currently in Pain?  No/denies          Social History   Tobacco Use  Smoking Status Never Smoker  Smokeless Tobacco Never Used    Goals Met:  Independence with exercise equipment Exercise tolerated well No report of cardiac concerns or symptoms Strength training completed today  Goals Unmet:  Not Applicable  Comments: Pt able to follow exercise prescription today without complaint.  Will continue to monitor for progression.   Dr. Emily Filbert is Medical Director for [place] and [facility].

## 2017-05-25 ENCOUNTER — Encounter: Payer: PPO | Admitting: *Deleted

## 2017-05-25 DIAGNOSIS — I209 Angina pectoris, unspecified: Secondary | ICD-10-CM | POA: Diagnosis not present

## 2017-05-25 DIAGNOSIS — I208 Other forms of angina pectoris: Secondary | ICD-10-CM

## 2017-05-25 NOTE — Progress Notes (Signed)
Daily Session Note  Patient Details  Name: Ronald Wallace MRN: [id number] Date of Birth: 08/07/1940 Referring Provider:     Cardiac Rehab from 05/03/2017 in [facility] Cardiac and Pulmonary Rehab  Referring Provider  [name]      Encounter Date: 05/25/2017  Check In: Session Check In - 05/25/17 1414      Check-In   Location  [facility]-Cardiac & Pulmonary Rehab    Staff Present  Renita Papa, RN BSN;Lainee Lehrman Luan Pulling, MA, RCEP, CCRP, Exercise Physiologist;Amanda Oletta Darter, [place], ACSM CEP, Exercise Physiologist    Supervising physician immediately available to respond to emergencies  See telemetry face sheet for immediately available ER MD    Medication changes reported      No    Fall or balance concerns reported     No    Warm-up and Cool-down  Performed on first and last piece of equipment    Resistance Training Performed  Yes    VAD Patient?  No      Pain Assessment   Currently in Pain?  No/denies    Multiple Pain Sites  No          Social History   Tobacco Use  Smoking Status Never Smoker  Smokeless Tobacco Never Used    Goals Met:  Independence with exercise equipment Exercise tolerated well Personal goals reviewed No report of cardiac concerns or symptoms Strength training completed today  Goals Unmet:  Not Applicable  Comments: Pt able to follow exercise prescription today without complaint.  Will continue to monitor for progression. See ITP for goal review   Dr. Emily Filbert is Medical Director for [place] and [facility].

## 2017-05-28 ENCOUNTER — Encounter: Payer: PPO | Admitting: *Deleted

## 2017-05-28 DIAGNOSIS — I208 Other forms of angina pectoris: Secondary | ICD-10-CM

## 2017-05-28 DIAGNOSIS — I209 Angina pectoris, unspecified: Secondary | ICD-10-CM | POA: Diagnosis not present

## 2017-05-28 NOTE — Progress Notes (Signed)
Daily Session Note  Patient Details  Name: Ronald Wallace MRN: [id number] Date of Birth: Sep 21, 1940 Referring Provider:     Cardiac Rehab from 05/03/2017 in [facility] Cardiac and Pulmonary Rehab  Referring Provider  [name]      Encounter Date: 05/28/2017  Check In: Session Check In - 05/28/17 0801      Check-In   Location  [facility]-Cardiac & Pulmonary Rehab    Staff Present  Earlean Shawl, BS, ACSM CEP, Exercise Physiologist;Susanne Bice, RN, BSN, CCRP;Abrar Bilton Luan Pulling, MA, RCEP, CCRP, Exercise Physiologist    Supervising physician immediately available to respond to emergencies  See telemetry face sheet for immediately available ER MD    Medication changes reported      No    Fall or balance concerns reported     No    Warm-up and Cool-down  Performed on first and last piece of equipment    Resistance Training Performed  Yes    VAD Patient?  No      Pain Assessment   Currently in Pain?  No/denies          Social History   Tobacco Use  Smoking Status Never Smoker  Smokeless Tobacco Never Used    Goals Met:  Independence with exercise equipment Exercise tolerated well No report of cardiac concerns or symptoms Strength training completed today  Goals Unmet:  Not Applicable  Comments: Pt able to follow exercise prescription today without complaint.  Will continue to monitor for progression.    Dr. Emily Filbert is Medical Director for [place] and [facility].

## 2017-05-30 ENCOUNTER — Encounter: Payer: PPO | Admitting: *Deleted

## 2017-05-30 VITALS — Ht 67.75 in | Wt 197.2 lb

## 2017-05-30 DIAGNOSIS — I208 Other forms of angina pectoris: Secondary | ICD-10-CM

## 2017-05-30 DIAGNOSIS — I209 Angina pectoris, unspecified: Secondary | ICD-10-CM | POA: Diagnosis not present

## 2017-05-30 NOTE — Progress Notes (Signed)
Daily Session Note  Patient Details  Name: Ronald Wallace MRN: [id number] Date of Birth: [birth year] Referring Provider:     Cardiac Rehab from 05/03/2017 in [facility] Cardiac and Pulmonary Rehab  Referring Provider  [name]      Encounter Date: 05/30/2017  Check In: Session Check In - 05/30/17 0902      Check-In   Location  [facility]Cardiac & Pulmonary Rehab    Staff Present  Heath Lark, RN, BSN, CCRP;Mary Kellie Shropshire, RN, BSN, [place], MA, RCEP, CCRP, Exercise Physiologist    Supervising physician immediately available to respond to emergencies  See telemetry face sheet for immediately available ER MD    Medication changes reported      No    Fall or balance concerns reported     No    Warm-up and Cool-down  Performed on first and last piece of equipment    Resistance Training Performed  Yes    VAD Patient?  No      Pain Assessment   Currently in Pain?  No/denies          Social History   Tobacco Use  Smoking Status Never Smoker  Smokeless Tobacco Never Used    Goals Met:  Independence with exercise equipment Exercise tolerated well No report of cardiac concerns or symptoms Strength training completed today  Goals Unmet:  Not Applicable  Comments: Pt able to follow exercise prescription today without complaint.  Will continue to monitor for progression. Stanley Name 12/14/16 1501 05/03/17 1421 05/30/17 0903     6 Minute Walk   Phase  -  -  Discharge   Distance  1488 feet  1355 feet  1695 feet   Distance % Change  -  -  25.1 %   Distance Feet Change  -  -  340 ft   Walk Time  6 minutes  6 minutes  6 minutes   # of Rest Breaks  0  0  0   MPH  2.82  2.56  3.21   METS  3.25  2.77  3.3   RPE  9  13  15    Perceived Dyspnea   3  1  -   VO2 Peak  11.36  9.72  11.56   Symptoms  No  Yes (comment)  No   Comments  -  dizziness  -   Resting HR  63 bpm  75 bpm  68 bpm   Resting BP  132/64  108/60  126/60   Resting Oxygen Saturation   96 %  -  -    Exercise Oxygen Saturation  during 6 min walk  95 %  97 %  -   Max Ex. HR  129 bpm  120 bpm  86 bpm   Max Ex. BP  162/64  132/52  174/82   2 Minute Post BP  130/60  122/64  -        Dr. Emily Filbert is Medical Director for [place] and [facility].

## 2017-06-01 ENCOUNTER — Encounter: Payer: PPO | Admitting: *Deleted

## 2017-06-01 DIAGNOSIS — I208 Other forms of angina pectoris: Secondary | ICD-10-CM

## 2017-06-01 DIAGNOSIS — I209 Angina pectoris, unspecified: Secondary | ICD-10-CM | POA: Diagnosis not present

## 2017-06-01 NOTE — Progress Notes (Signed)
Daily Session Note  Patient Details  Name: Ronald Wallace MRN: [id number] Date of Birth: 09-19-1940 Referring Provider:     Cardiac Rehab from 05/03/2017 in [facility] Cardiac and Pulmonary Rehab  Referring Provider  [name]      Encounter Date: 06/01/2017  Check In: Session Check In - 06/01/17 0806      Check-In   Location  [facility]-Cardiac & Pulmonary Rehab    Staff Present  Renita Papa, RN BSN;Marchelle Rinella Luan Pulling, MA, RCEP, CCRP, Exercise Physiologist;Amanda Oletta Darter, [place], ACSM CEP, Exercise Physiologist    Supervising physician immediately available to respond to emergencies  See telemetry face sheet for immediately available ER MD    Medication changes reported      No    Fall or balance concerns reported     No    Warm-up and Cool-down  Performed on first and last piece of equipment    Resistance Training Performed  Yes    VAD Patient?  No      Pain Assessment   Currently in Pain?  No/denies          Social History   Tobacco Use  Smoking Status Never Smoker  Smokeless Tobacco Never Used    Goals Met:  Independence with exercise equipment Exercise tolerated well No report of cardiac concerns or symptoms Strength training completed today  Goals Unmet:  Not Applicable  Comments: Pt able to follow exercise prescription today without complaint.  Will continue to monitor for progression.    Dr. Emily Filbert is Medical Director for [place] and [facility].

## 2017-06-04 ENCOUNTER — Encounter: Payer: PPO | Admitting: *Deleted

## 2017-06-04 DIAGNOSIS — I208 Other forms of angina pectoris: Secondary | ICD-10-CM

## 2017-06-04 DIAGNOSIS — I209 Angina pectoris, unspecified: Secondary | ICD-10-CM | POA: Diagnosis not present

## 2017-06-04 NOTE — Progress Notes (Signed)
Daily Session Note  Patient Details  Name: Ronald Wallace MRN: [id number] Date of Birth: [birth year] Referring Provider:     Cardiac Rehab from 05/03/2017 in [facility] Cardiac and Pulmonary Rehab  Referring Provider  [name]      Encounter Date: 06/04/2017  Check In: Session Check In - 06/04/17 0842      Check-In   Location  [facility]Cardiac & Pulmonary Rehab    Staff Present  Heath Lark, RN, BSN, CCRP;Jessica Luan Pulling, MA, RCEP, CCRP, Exercise Physiologist;Marysa Wessner Amedeo Plenty, BS, ACSM CEP, Exercise Physiologist    Supervising physician immediately available to respond to emergencies  See telemetry face sheet for immediately available ER MD    Medication changes reported      No    Fall or balance concerns reported     No    Tobacco Cessation  No Change    Warm-up and Cool-down  Performed on first and last piece of equipment    Resistance Training Performed  Yes    VAD Patient?  No      Pain Assessment   Currently in Pain?  No/denies    Multiple Pain Sites  No          Social History   Tobacco Use  Smoking Status Never Smoker  Smokeless Tobacco Never Used    Goals Met:  Independence with exercise equipment Exercise tolerated well No report of cardiac concerns or symptoms Strength training completed today  Goals Unmet:  Not Applicable  Comments: Pt able to follow exercise prescription today without complaint.  Will continue to monitor for progression.    Dr. Emily Filbert is Medical Director for [place] and [facility].

## 2017-06-05 ENCOUNTER — Other Ambulatory Visit: Payer: Self-pay | Admitting: Cardiovascular Disease

## 2017-06-06 ENCOUNTER — Encounter: Payer: PPO | Admitting: *Deleted

## 2017-06-06 ENCOUNTER — Encounter: Payer: Self-pay | Admitting: *Deleted

## 2017-06-06 DIAGNOSIS — I209 Angina pectoris, unspecified: Secondary | ICD-10-CM | POA: Diagnosis not present

## 2017-06-06 DIAGNOSIS — I208 Other forms of angina pectoris: Secondary | ICD-10-CM

## 2017-06-06 NOTE — Progress Notes (Signed)
Cardiac Individual Treatment Plan  Patient Details  Name: Ronald Wallace MRN: [id number] Date of Birth: [birth year] Referring Provider:     Cardiac Rehab from 05/03/2017 in [facility] Cardiac [facility] Pulmonary Re[facility]  Referring Provider  [name]      Initial Encounter Date:    Cardiac Rehab from 05/03/2017 in [facility] Cardiac and Pulmonary Rehab  Date  05/03/17  Referring Provider  Rockey Situ      Visit Diagnosis: Stable angina ([place])  Patient's Home Medications on Admission:  Current Outpatient Medications:  .  aspirin 81 MG tablet, Take 81 mg by mouth daily., Disp: , Rfl:  .  calcium carbonate (OS-CAL) 600 MG TABS tablet, Take 600 mg by mouth daily with breakfast., Disp: , Rfl:  .  cholecalciferol (VITAMIN D) 1000 UNITS tablet, Take 1,000 Units by mouth daily., Disp: , Rfl:  .  Ferrous Sulfate (IRON) 325 (65 FE) MG TABS, Take 1 tablet by mouth daily. , Disp: , Rfl:  .  furosemide (LASIX) 20 MG tablet, Take 1 tablet (20 mg total) by mouth as needed for fluid (for shortness of breath)., Disp: 30 tablet, Rfl: 6 .  gabapentin (NEURONTIN) 300 MG capsule, Take 600 mg by mouth at bedtime., Disp: , Rfl:  .  hydrochlorothiazide (MICROZIDE) 12.5 MG capsule, Take 12.5 mg by mouth daily. , Disp: , Rfl:  .  isosorbide mononitrate (IMDUR) 30 MG 24 hr tablet, Take 1 tablet by mouth once daily, Disp: 90 tablet, Rfl: 0 .  LANTUS 100 UNIT/ML injection, Inject 42 Units into the skin daily. Take at bedtime, Disp: , Rfl:  .  lisinopril (PRINIVIL,ZESTRIL) 20 MG tablet, Take 20 mg by mouth daily., Disp: , Rfl:  .  metFORMIN (GLUCOPHAGE) 500 MG tablet, Take 250 mg by mouth 2 (two) times daily with a meal., Disp: , Rfl:  .  NOVOLOG 100 UNIT/ML injection, Inject into the skin 3 (three) times daily with meals. 12 units am, 12 units lunch, 14 units supper, Disp: , Rfl:  .  simvastatin (ZOCOR) 20 MG tablet, Take 20 mg by mouth daily., Disp: , Rfl:  .  tamsulosin (FLOMAX) 0.4 MG CAPS capsule, Take 0.4 mg by mouth daily., Disp: ,  Rfl:   Past Medical History: Past Medical History:  Diagnosis Date  . Anemia   . CAD (coronary artery disease)    a. 02/2014 Neg Ex MV, EF 65%;  b. 06/2015 Abnl MV ([facility])-->Cath ([facility]): LM nl, LAD 30p/m, LCX 30p, OM2 50p, RCA 100p w/ L-->R collats, nl EF-->Med Rx.  . Diabetes mellitus, type II, insulin dependent ([place])    With neuropathy  . Diverticulitis   . Essential hypertension   . Hyperlipidemia   . Neuropathy   . Shortness of breath dyspnea   . Syncope and collapse    a. 02/2014 Carotid Dopplers: 1-39% bilateral ICA with normal Subclavian & vertebral arteries; b. 02/2014 2 D Echo: EF 50-55%, Gr 1 DD, Mild MR & LA dilation -- was bradycardic in 40s.    Tobacco Use: Social History   Tobacco Use  Smoking Status Never Smoker  Smokeless Tobacco Never Used    Labs: Recent Review Flowsheet Data    There is no flowsheet data to display.       Exercise Target Goals:    Exercise Program Goal: Individual exercise prescription set using results from initial 6 min walk test and THRR while considering  patient's activity barriers and safety.   Exercise Prescription Goal: Initial exercise prescription builds to 30-45 minutes a day of  aerobic activity, 2-3 days per week.  Home exercise guidelines will be given to patient during program as part of exercise prescription that the participant will acknowledge.  Activity Barriers & Risk Stratification: Activity Barriers & Cardiac Risk Stratification - 05/03/17 1439      Activity Barriers & Cardiac Risk Stratification   Activity Barriers  Back Problems;Shortness of Breath    Cardiac Risk Stratification  High       6 Minute Walk: 6 Minute Walk    Row Name 12/14/16 1501 05/03/17 1421 05/30/17 0903     6 Minute Walk   Phase  -  -  Discharge   Distance  1488 feet  1355 feet  1695 feet   Distance % Change  -  -  25.1 %   Distance Feet Change  -  -  340 ft   Walk Time  6 minutes  6 minutes  6 minutes   # of Rest Breaks  0   0  0   MPH  2.82  2.56  3.21   METS  3.25  2.77  3.3   RPE  _0 Perceived Dyspnea   3  1  -   VO2 Peak  11.36  9.72  11.56   Symptoms  No  Yes (comment)  No   Comments  -  dizziness  -   Resting HR  63 bpm  75 bpm  68 bpm   Resting BP  132/64  108/60  126/60   Resting Oxygen Saturation   96 %  -  -   Exercise Oxygen Saturation  during 6 min walk  95 %  97 %  -   Max Ex. HR  129 bpm  120 bpm  86 bpm   Max Ex. BP  162/64  132/52  174/82   2 Minute Post BP  130/60  122/64  -      Oxygen Initial Assessment:   Oxygen Re-Evaluation:   Oxygen Discharge (Final Oxygen Re-Evaluation):   Initial Exercise Prescription: Initial Exercise Prescription - 05/03/17 1400      Date of Initial Exercise RX and Referring Provider   Date  05/03/17    Referring Provider  [name]      Treadmill   MPH  2.5    Grade  0    Minutes  15    METs  2.77      NuStep   Level  2    SPM  80    Minutes  15    METs  2.7      REL-XR   Level  2    Speed  50    Minutes  15    METs  2.7      Prescription Details   Frequency (times per week)  3    Duration  Progress to 45 minutes of aerobic exercise without signs/symptoms of physical distress      Intensity   THRR 40-80% of Max Heartrate  103-131    Ratings of Perceived Exertion  11-13    Perceived Dyspnea  0-4      Resistance Training   Training Prescription  Yes    Weight  3 lb    Reps  10-15       Perform Capillary Blood Glucose checks as needed.  Exercise Prescription Changes: Exercise Prescription Changes    Row Name 12/26/16 1300 01/24/17 1500 01/31/17 0900 02/05/17 1500 05/03/17 1400     Response  to Exercise   Blood Pressure (Admit)  134/70  132/64  -  122/60  108/60   Blood Pressure (Exercise)  134/60  144/52  -  126/74  132/52   Blood Pressure (Exit)  126/54  166/80  -  126/70  122/64   Heart Rate (Admit)  73 bpm  60 bpm  -  65 bpm  75 bpm   Heart Rate (Exercise)  105 bpm  110 bpm  -  107 bpm  120 bpm   Heart Rate  (Exit)  68 bpm  70 bpm  -  62 bpm  73 bpm   Rating of Perceived Exertion (Exercise)  13  14  -  12  13   Symptoms  none  none  -  none  -   Comments  first full day of exercise  third full day of exercise  -  -  -   Duration  Progress to 45 minutes of aerobic exercise without signs/symptoms of physical distress  Progress to 45 minutes of aerobic exercise without signs/symptoms of physical distress  -  Continue with 45 min of aerobic exercise without signs/symptoms of physical distress.  -   Intensity  THRR unchanged  THRR unchanged  -  THRR unchanged  -     Progression   Progression  Continue to progress workloads to maintain intensity without signs/symptoms of physical distress.  Continue to progress workloads to maintain intensity without signs/symptoms of physical distress.  -  Continue to progress workloads to maintain intensity without signs/symptoms of physical distress.  -   Average METs  3.22  2.98  -  3.42  -     Resistance Training   Training Prescription  Yes  Yes  -  Yes  -   Weight  3 lb  3 lb  -  3 lb  -   Reps  10-15  10-15  -  10-15  -     Interval Training   Interval Training  No  No  -  No  -     Treadmill   MPH  2.7  2.7  -  2.9  -   Grade  0.5  0.5  -  1  -   Minutes  15  15  -  15  -   METs  3.25  3.25  -  3.62  -     NuStep   Level  3  3  -  3  -   Minutes  15  15  -  15  -   METs  3  4  -  3.4  -     REL-XR   Level  2  2  -  2  -   Minutes  15  15  -  15  -   METs  3.4  1.7  -  3.4  -     Home Exercise Plan   Plans to continue exercise at  -  -  [employer] (comment) walking, treadmill, [employer] (comment) walking, treadmill, [employer]  -   Frequency  -  -  Add 2 additional days to program exercise sessions.  Add 2 additional days to program exercise sessions.  -   Initial Home Exercises Provided  -  -  01/31/17  01/31/17  -   [place] Name 05/15/17 1100 05/28/17 1400           Response to Exercise   Blood Pressure (Admit)  140/80   130/80      Blood Pressure (Exercise)  138/74  140/80      Blood Pressure (Exit)  126/70  124/64      Heart Rate (Admit)  70 bpm  67 bpm      Heart Rate (Exercise)  94 bpm  116 bpm      Heart Rate (Exit)  68 bpm  70 bpm      Rating of Perceived Exertion (Exercise)  13  12      Symptoms  none  none      Duration  Continue with 45 min of aerobic exercise without signs/symptoms of physical distress.  Continue with 45 min of aerobic exercise without signs/symptoms of physical distress.      Intensity  THRR unchanged  THRR unchanged        Progression   Progression  Continue to progress workloads to maintain intensity without signs/symptoms of physical distress.  Continue to progress workloads to maintain intensity without signs/symptoms of physical distress.      Average METs  3.17  3.54        Resistance Training   Training Prescription  Yes  Yes      Weight  3 lb  3 lb      Reps  10-15  10-15        Interval Training   Interval Training  No  No        Treadmill   MPH  2.5  2.5      Grade  0  0      Minutes  15  15      METs  2.77  2.91        NuStep   Level  2  4      Minutes  15  15      METs  3  2.7        REL-XR   Level  2  2      Minutes  15  15      METs  4.3  5        Home Exercise Plan   Plans to continue exercise at  [employer] (comment) walking, treadmill, [employer] (comment) walking, treadmill, [employer]      Frequency  Add 2 additional days to program exercise sessions.  Add 2 additional days to program exercise sessions.      Initial Home Exercises Provided  01/31/17  01/31/17         Exercise Comments: Exercise Comments    Row Name 12/22/16 8127 05/07/17 0804 06/06/17 0917       Exercise Comments  First full day of exercise!  Patient was oriented to gym and equipment including functions, settings, policies, and procedures.  Patient's individual exercise prescription and treatment plan were reviewed.  All starting workloads were  established based on the results of the 6 minute walk test done at initial orientation visit.  The plan for exercise progression was also introduced and progression will be customized based on patient's performance and goals.  First full day of returning to exercise!  Patient was oriented to gym and equipment including functions, settings, policies, and procedures.  Patient's individual exercise prescription and treatment plan were reviewed.  All starting workloads were established based on the results of the 6 minute walk test done at initial orientation visit.  The plan for exercise progression was also introduced and progression will be customized based on patient's performance and  goals.  Ronald Wallace graduated today from  rehab with 35 sessions completed.  Details of the patient's exercise prescription and what He needs to do in order to continue the prescription and progress were discussed with patient.  Patient was given a copy of prescription and goals.  Patient verbalized understanding.  [name] plans to continue to exercise by walking at home.        Exercise Goals and Review: Exercise Goals    Row Name 12/14/16 1500             Exercise Goals   Increase Physical Activity  Yes       Intervention  Provide advice, education, support and counseling about physical activity/exercise needs.;Develop an individualized exercise prescription for aerobic and resistive training based on initial evaluation findings, risk stratification, comorbidities and participant's personal goals.       Expected Outcomes  Achievement of increased cardiorespiratory fitness and enhanced flexibility, muscular endurance and strength shown through measurements of functional capacity and personal statement of participant.       Increase Strength and Stamina  Yes       Intervention  Provide advice, education, support and counseling about physical activity/exercise needs.;Develop an individualized exercise prescription for aerobic  and resistive training based on initial evaluation findings, risk stratification, comorbidities and participant's personal goals.       Expected Outcomes  Achievement of increased cardiorespiratory fitness and enhanced flexibility, muscular endurance and strength shown through measurements of functional capacity and personal statement of participant.       Able to understand and use rate of perceived exertion (RPE) scale  Yes       Intervention  Provide education and explanation on how to use RPE scale       Expected Outcomes  Short Term: Able to use RPE daily in rehab to express subjective intensity level;Long Term:  Able to use RPE to guide intensity level when exercising independently       Able to understand and use Dyspnea scale  Yes       Intervention  Provide education and explanation on how to use Dyspnea scale       Expected Outcomes  Short Term: Able to use Dyspnea scale daily in rehab to express subjective sense of shortness of breath during exertion;Long Term: Able to use Dyspnea scale to guide intensity level when exercising independently       Knowledge and understanding of Target Heart Rate Range (THRR)  Yes       Intervention  Provide education and explanation of THRR including how the numbers were predicted and where they are located for reference       Expected Outcomes  Short Term: Able to state/look up THRR;Long Term: Able to use THRR to govern intensity when exercising independently;Short Term: Able to use daily as guideline for intensity in rehab       Able to check pulse independently  Yes       Intervention  Provide education and demonstration on how to check pulse in carotid and radial arteries.;Review the importance of being able to check your own pulse for safety during independent exercise       Expected Outcomes  Short Term: Able to explain why pulse checking is important during independent exercise;Long Term: Able to check pulse independently and accurately        Understanding of Exercise Prescription  Yes       Intervention  Provide education, explanation, and written materials on patient's individual exercise prescription  Expected Outcomes  Short Term: Able to explain program exercise prescription;Long Term: Able to explain home exercise prescription to exercise independently          Exercise Goals Re-Evaluation : Exercise Goals Re-Evaluation    Row Name 12/22/16 6962 01/10/17 1511 01/24/17 1528 01/31/17 0847 02/05/17 1537     Exercise Goal Re-Evaluation   Exercise Goals Review  Understanding of Exercise Prescription;Knowledge and understanding of Target Heart Rate Range (THRR);Able to understand and use rate of perceived exertion (RPE) scale  -  Increase Physical Activity;Increase Strength and Stamina;Understanding of Exercise Prescription  Increase Physical Activity;Increase Strength and Stamina;Understanding of Exercise Prescription  Increase Physical Activity;Increase Strength and Stamina;Understanding of Exercise Prescription   Comments  Reviewed RPE scale, THR and program prescription with pt today.  Pt voiced understanding and was given a copy of goals to take home.   After some dermatology surgery, [name]'s doctor requested that he stay out until after the new year.   Ronald Wallace is off to a good start in rehab.  He has had offically three full days of exercise due to his surgery.  He was able to return at his current workloads without a problem.  We will start to increase his workloads and continue to monitor his progress.   Ronald Wallace is doing well in rehab. He is feeling stronger and has more stamina.   Currently, he has been walking some at home.  Reviewed home exercise with pt today.  Pt plans to walking, treadmill, and returning to [facility]  for exercise.  Reviewed THR, pulse, RPE, sign and symptoms, and when to call 911 or MD.  Also discussed weather considerations and indoor options.  Pt voiced understanding.  Ronald Wallace has been doing well in rehab.  He  is now up to 3.4 METs on both the XR and NuStep.  We will continue to monitor his progression.    Expected Outcomes  Short: Use RPE daily to regulate intensity.  Long: Follow program prescription in THR.  -  Short: Increase incline on treadmill and review home exercise.  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   Short: Return to [facility] for exercise.  Long: Continue to increase strength and stamina.   Short: Increase workloads on XR and NuStep.  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    [place] Name 02/20/17 1519 03/21/17 1439 05/07/17 0804 05/15/17 1119 05/25/17 1426     Exercise Goal Re-Evaluation   Exercise Goals Review  -  -  Increase Physical Activity;Increase Strength and Stamina;Able to understand and use rate of perceived exertion (RPE) scale;Understanding of Exercise Prescription  Increase Physical Activity;Understanding of Exercise Prescription;Increase Strength and Stamina  Increase Physical Activity;Understanding of Exercise Prescription;Increase Strength and Stamina   Comments  Out since last review.  Out since last review.  Reviewed RPE scale, THR and program prescription with pt today.  Pt voiced understanding and was given a copy of goals to take home.   Ronald Wallace is off to a good restart in rehab.  He has been able to return to his workloads again.  We will continue to monitor his progress.   Ronald Wallace has been doing well in rehab.  He was able to mow 6 acres yesterday on his riding mower.  He has been walking for 38mn and stretching daily!!!     Expected Outcomes  -  -  Short: Use RPE daily to regulate intensity.  Long: Follow program prescription in THR.  Short: Increase workloads  on Nustep and XR.  Long: Continue to exercise more independently.   Short: Continue to exercise daily.  Long: Continue to exercise independently.    [place] Name 05/28/17 1458             Exercise Goal Re-Evaluation   Exercise Goals Review  Increase Physical  Activity;Understanding of Exercise Prescription;Increase Strength and Stamina       Comments  Ronald Wallace has been doing in rehab.  He is 5 METs on the XR!  We will continue to monitor his progress.        Expected Outcomes  Short: Increase workload on XR.  Long: Continue to walk daily.           Discharge Exercise Prescription (Final Exercise Prescription Changes): Exercise Prescription Changes - 05/28/17 1400      Response to Exercise   Blood Pressure (Admit)  130/80    Blood Pressure (Exercise)  140/80    Blood Pressure (Exit)  124/64    Heart Rate (Admit)  67 bpm    Heart Rate (Exercise)  116 bpm    Heart Rate (Exit)  70 bpm    Rating of Perceived Exertion (Exercise)  12    Symptoms  none    Duration  Continue with 45 min of aerobic exercise without signs/symptoms of physical distress.    Intensity  THRR unchanged      Progression   Progression  Continue to progress workloads to maintain intensity without signs/symptoms of physical distress.    Average METs  3.54      Resistance Training   Training Prescription  Yes    Weight  3 lb    Reps  10-15      Interval Training   Interval Training  No      Treadmill   MPH  2.5    Grade  0    Minutes  15    METs  2.91      NuStep   Level  4    Minutes  15    METs  2.7      REL-XR   Level  2    Minutes  15    METs  5      Home Exercise Plan   Plans to continue exercise at  [employer] (comment) walking, treadmill, [employer]    Frequency  Add 2 additional days to program exercise sessions.    Initial Home Exercises Provided  01/31/17       Nutrition:  Target Goals: Understanding of nutrition guidelines, daily intake of sodium <154m, cholesterol <2071m calories 30% from fat and 7% or less from saturated fats, daily to have 5 or more servings of fruits and vegetables.  Biometrics: Pre Biometrics - 05/03/17 1420      Pre Biometrics   Height  5' 7.75" (1.721 m)    Weight  197 lb 4.8 oz (89.5 kg)    Waist  Circumference  44 inches    Hip Circumference  39 inches    Waist to Hip Ratio  1.13 %    BMI (Calculated)  30.22    Single Leg Stand  1.27 seconds      Post Biometrics - 05/30/17 0905       Post  Biometrics   Height  5' 7.75" (1.721 m)    Weight  197 lb 3.2 oz (89.4 kg)    Waist Circumference  39.5 inches    Hip Circumference  38 inches    Waist to Hip  Ratio  1.04 %    BMI (Calculated)  30.2    Single Leg Stand  4.45 seconds       Nutrition Therapy Plan and Nutrition Goals: Nutrition Therapy & Goals - 06/06/17 1003      Nutrition Therapy   Diet  Diabetic    Drug/Food Interactions  Statins/Certain Fruits    Protein (specify units)  12oz    Fiber  35 grams    Whole Grain Foods  3 servings    Saturated Fats  15 max. grams    Fruits and Vegetables  6 servings/day 8 ideal    Sodium  1500 grams      Personal Nutrition Goals   Nutrition Goal  For snacks, either choose sugar free varieties of items like candy, or choose a more nutritious snack that is a combination of carbohydrate + protein (nabs for example). If you are truely hungry, the carbohydrate + protein option will be more satisfying and filling.    Personal Goal #2  Consume a protein source at each meal, and ideally as part of your snacks as well. This can include non-animal based sources of protein like beans and dairy products    Personal Goal #3  Eat at regular time intervals throughout the day    Comments  His wife is also a diabetic and she cooks for both of them. They have cut out salt, eat a variety of fruits & vegetables, and do not go out to eat often.      Intervention Plan   Intervention  Prescribe, educate and counsel regarding individualized specific dietary modifications aiming towards targeted core components such as weight, hypertension, lipid management, diabetes, heart failure and other comorbidities.    Expected Outcomes  Short Term Goal: Understand basic principles of dietary content, such as calories,  fat, sodium, cholesterol and nutrients.;Short Term Goal: A plan has been developed with personal nutrition goals set during dietitian appointment.;Long Term Goal: Adherence to prescribed nutrition plan.       Nutrition Assessments: Nutrition Assessments - 06/06/17 1456      MEDFICTS Scores   Pre Score  52    Post Score  -- did not complete       Nutrition Goals Re-Evaluation: Nutrition Goals Re-Evaluation    Row Name 01/31/17 0851 05/25/17 1421 06/06/17 1008         Goals   Nutrition Goal  Meet with nutritionist on 1/30 during class  Meet with nutritionist during class   For snacks, either choose sugar free varieties of items like candy, or choose a more nutritious snack that is a combination of carbohydrate + protein (nabs for example). If you are truely hungry, the carbohydrate + protein option will be more satisfying and filling.     Comment  Ronald Wallace would like to meet with nutrtionist during class as lives is [employer].  He would like to just hear some tips for weight loss and how to cut back on his snacking.   Ronald Wallace went out before prior to meeting wiht nutritionist.  Appointment rescheduled for 5/22 during class.  He would like tips for weight loss.   He admits to "sneaking" candy that his wife hides for the grandchildren often. Other snacks include sugar free popsicles, fruit and nabs.     Expected Outcome  Short: Meet with dietician  Long: Cut back on snacking.   Short: Meet with dietician  Long: Cut back on snacking.   He will choose sugar free candy or ideally  a more nutritious snack option that is a combination of carbohydrate and protein       Personal Goal #2 Re-Evaluation   Personal Goal #2  -  -  Consume a protein source at each meal and ideally as part of snacks as well. This can include non- animal based protein sources like beans and dairy products       Personal Goal #3 Re-Evaluation   Personal Goal #3  -  -  Eat at regular time intervals throughout the day         Nutrition Goals Discharge (Final Nutrition Goals Re-Evaluation): Nutrition Goals Re-Evaluation - 06/06/17 1008      Goals   Nutrition Goal  For snacks, either choose sugar free varieties of items like candy, or choose a more nutritious snack that is a combination of carbohydrate + protein (nabs for example). If you are truely hungry, the carbohydrate + protein option will be more satisfying and filling.    Comment  He admits to "sneaking" candy that his wife hides for the grandchildren often. Other snacks include sugar free popsicles, fruit and nabs.    Expected Outcome  He will choose sugar free candy or ideally a more nutritious snack option that is a combination of carbohydrate and protein      Personal Goal #2 Re-Evaluation   Personal Goal #2  Consume a protein source at each meal and ideally as part of snacks as well. This can include non- animal based protein sources like beans and dairy products      Personal Goal #3 Re-Evaluation   Personal Goal #3  Eat at regular time intervals throughout the day       Psychosocial: Target Goals: Acknowledge presence or absence of significant depression and/or stress, maximize coping skills, provide positive support system. Participant is able to verbalize types and ability to use techniques and skills needed for reducing stress and depression.   Initial Review & Psychosocial Screening: Initial Psych Review & Screening - 05/03/17 1444      Initial Review   Current issues with  Current Stress Concerns    Source of Stress Concerns  Unable to perform yard/household activities;Family    Comments  Ronald Wallace has a married child and has stress about this family unit      Effort?  Yes      Barriers   Psychosocial barriers to participate in program  There are no identifiable barriers or psychosocial needs.;The patient should benefit from training in stress management and relaxation.      Screening Interventions    Interventions  Encouraged to exercise;Provide feedback about the scores to participant    Expected Outcomes  Short Term goal: Utilizing psychosocial counselor, staff and physician to assist with identification of specific Stressors or current issues interfering with healing process. Setting desired goal for each stressor or current issue identified.;Long Term Goal: Stressors or current issues are controlled or eliminated.;Short Term goal: Identification and review with participant of any Quality of Life or Depression concerns found by scoring the questionnaire.;Long Term goal: The participant improves quality of Life and PHQ9 Scores as seen by post scores and/or verbalization of changes       Quality of Life Scores:  Quality of Life - 06/06/17 1456      Quality of Life Scores   Health/Function Pre  18.37 %    Health/Function Post  20 %    Health/Function % Change  8.87 %  Socioeconomic Pre  23.71 %    Socioeconomic Post  25 %    Socioeconomic % Change   5.44 %    Psych/Spiritual Pre  24.43 %    Psych/Spiritual Post  24 %    Psych/Spiritual % Change  -1.76 %    Family Pre  22.8 %    Family Post  18 %    Family % Change  -21.05 %    GLOBAL Pre  18.37 %    GLOBAL Post  21.5 %    GLOBAL % Change  17.04 %      Scores of 19 and below usually indicate a poorer quality of life in these areas.  A difference of  2-3 points is a clinically meaningful difference.  A difference of 2-3 points in the total score of the Quality of Life Index has been associated with significant improvement in overall quality of life, self-image, physical symptoms, and general health in studies assessing change in quality of life.  PHQ-9: Recent Review Flowsheet Data    Depression screen [facility] 2/9 06/06/2017 05/03/2017 01/24/2017 12/14/2016 12/14/2016   Decreased Interest 0 2 1 - 0   Down, Depressed, Hopeless 1 0 1 - 2   PHQ - 2 Score _0 - 2   Altered sleeping 0 0 0 - 3   Tired, decreased energy _1 - 2   Change  in appetite 1 0 0 - 0   Feeling bad or failure about yourself  1 2 0 - 1   Trouble concentrating 1 0 0 - 3   Moving slowly or fidgety/restless 0 0 0 - 0   Suicidal thoughts 0 0 0 0  1   PHQ-9 Score _2 - 12   Difficult doing work/chores Somewhat difficult Somewhat difficult Not difficult at all - -     Interpretation of Total Score  Total Score Depression Severity:  1-4 = Minimal depression, 5-9 = Mild depression, 10-14 = Moderate depression, 15-19 = Moderately severe depression, 20-27 = Severe depression   Psychosocial Evaluation and Intervention: Psychosocial Evaluation - 05/07/17 0950      Psychosocial Evaluation & Interventions   Interventions  Stress management education;Encouraged to exercise with the program and follow exercise prescription    Comments  Mr. [place] has returned to this program.  His Dr. encouraged he discontinue earlier due to shingles outbreak.  Ronald Wallace is a 77 year old who has a strong support system.  He continues to sleep well and has a good appetite.  Ronald Wallace admits to some depression or anxiety at times and his mood is "okay" most of the time.  He admits to stress and concern with his daughter not spending any time with her twins - leaving that responsibility primarily to [place] and his wife.  counselor encouraged him to set healthy limits and shared strategies on this.  Ronald Wallace has goals to lose some weight and decrease some soreness in his body due to a fall he experienced 20+ years ago.  Staff will follow with him.     Expected Outcomes  Short - Ronald Wallace will meet with the dietician to address his weight loss goals.  He will also begin to set healthy limits with his daughter to alleviate his concerns.  Long - Ronald Wallace will exercise more consistenly for his heart health and to help manage his stress more positively.      Continue Psychosocial Services   Follow up required by staff  Psychosocial Re-Evaluation: Psychosocial Re-Evaluation    Row Name 01/31/17  0855 05/25/17 1415           Psychosocial Re-Evaluation   Current issues with  Current Stress Concerns  Current Stress Concerns      Comments  Ronald Wallace has been doing well in rehab.  He remains postive overall.  He and his son are doing better and he's doing better too.  His son is out in [place] skiing, but called him on Sunday.  He continues to sleep well.  He has found that exercise helps him feel better and he is not as sore any more.   Ronald Wallace continues to do well in rehab since returning.  He is staying postive and doing his best to set up boundaries with his daughter. His strained relationship continues to bring him grief and he got teary eyed just talking about it briefly today.  He has found that class has been very helpful at learning what to do correctly to keep him moving forward.  He also enjoyed talking with Juliann Pulse.  He continues to sleep well and stay mostly postive.       Expected Outcomes  Short: Continue to attend rehab and reduce soreness.  Long: Continue to maintain positive attitude.   Short: Continue to attend rehab.  Long: Continue to cope with daughter.          Psychosocial Discharge (Final Psychosocial Re-Evaluation): Psychosocial Re-Evaluation - 05/25/17 1415      Psychosocial Re-Evaluation   Current issues with  Current Stress Concerns    Comments  Ronald Wallace continues to do well in rehab since returning.  He is staying postive and doing his best to set up boundaries with his daughter. His strained relationship continues to bring him grief and he got teary eyed just talking about it briefly today.  He has found that class has been very helpful at learning what to do correctly to keep him moving forward.  He also enjoyed talking with Juliann Pulse.  He continues to sleep well and stay mostly postive.     Expected Outcomes  Short: Continue to attend rehab.  Long: Continue to cope with daughter.        Vocational Rehabilitation: Provide vocational rehab assistance to qualifying  candidates.   Vocational Rehab Evaluation & Intervention: Vocational Rehab - 05/03/17 1452      Initial Vocational Rehab Evaluation & Intervention   Assessment shows need for Vocational Rehabilitation  No       Education: Education Goals: Education classes will be provided on a variety of topics geared toward better understanding of heart health and risk factor modification. Participant will state understanding/return demonstration of topics presented as noted by education test scores.  Learning Barriers/Preferences: Learning Barriers/Preferences - 05/03/17 1451      Learning Barriers/Preferences   Learning Barriers  None    Learning Preferences  None       Education Topics:  AED/CPR: - Group verbal and written instruction with the use of models to demonstrate the basic use of the AED with the basic ABC's of resuscitation.   Cardiac Rehab from 06/06/2017 in [facility] Cardiac and Pulmonary Rehab  Date  06/06/17  Educator  [facility]  Instruction Review Code  1- Verbalizes Understanding      General Nutrition Guidelines/Fats and Fiber: -Group instruction provided by verbal, written material, models and posters to present the general guidelines for heart healthy nutrition. Gives an explanation and review of dietary fats and fiber.   Cardiac Rehab from  06/06/2017 in [facility] Cardiac and Pulmonary Rehab  Date  06/04/17  Educator  [facility]  Instruction Review Code  1- Verbalizes Understanding      Controlling Sodium/Reading Food Labels: -Group verbal and written material supporting the discussion of sodium use in heart healthy nutrition. Review and explanation with models, verbal and written materials for utilization of the food label.   Exercise Physiology & General Exercise Guidelines: - Group verbal and written instruction with models to review the exercise physiology of the cardiovascular system and associated critical values. Provides general exercise guidelines with specific guidelines to those  with heart or lung disease.    Aerobic Exercise & Resistance Training: - Gives group verbal and written instruction on the various components of exercise. Focuses on aerobic and resistive training programs and the benefits of this training and how to safely progress through these programs..   Flexibility, Balance, Mind/Body Relaxation: Provides group verbal/written instruction on the benefits of flexibility and balance training, including mind/body exercise modes such as yoga, pilates and tai chi.  Demonstration and skill practice provided.   Cardiac Rehab from 06/06/2017 in [facility] Cardiac and Pulmonary Rehab  Date  05/07/17  Educator  [facility]  Instruction Review Code  1- Verbalizes Understanding      Stress and Anxiety: - Provides group verbal and written instruction about the health risks of elevated stress and causes of high stress.  Discuss the correlation between heart/lung disease and anxiety and treatment options. Review healthy ways to manage with stress and anxiety.   Cardiac Rehab from 06/06/2017 in [facility] Cardiac and Pulmonary Rehab  Date  05/16/17  Educator  [facility]  Instruction Review Code  1- Verbalizes Understanding      Depression: - Provides group verbal and written instruction on the correlation between heart/lung disease and depressed mood, treatment options, and the stigmas associated with seeking treatment.   Anatomy & Physiology of the Heart: - Group verbal and written instruction and models provide basic cardiac anatomy and physiology, with the coronary electrical and arterial systems. Review of Valvular disease and Heart Failure   Cardiac Rehab from 06/06/2017 in [facility] Cardiac and Pulmonary Rehab  Date  05/14/17  Educator  [facility]  Instruction Review Code  1- Verbalizes Understanding      Cardiac Procedures: - Group verbal and written instruction to review commonly prescribed medications for heart disease. Reviews the medication, class of the drug, and side effects. Includes  the steps to properly store meds and maintain the prescription regimen. (beta blockers and nitrates)   Cardiac Rehab from 06/06/2017 in [facility] Cardiac and Pulmonary Rehab  Date  05/28/17  Educator  [facility]  Instruction Review Code  1- Verbalizes Understanding      Cardiac Medications I: - Group verbal and written instruction to review commonly prescribed medications for heart disease. Reviews the medication, class of the drug, and side effects. Includes the steps to properly store meds and maintain the prescription regimen.   Cardiac Rehab from 06/06/2017 in [facility] Cardiac and Pulmonary Rehab  Date  05/21/17  Educator  SB  Instruction Review Code  1- Verbalizes Understanding      Cardiac Medications II: -Group verbal and written instruction to review commonly prescribed medications for heart disease. Reviews the medication, class of the drug, and side effects. (all other drug classes)   Cardiac Rehab from 06/06/2017 in [facility] Cardiac and Pulmonary Rehab  Date  05/09/17  Educator  [facility]  Instruction Review Code  1- Verbalizes Understanding       Go  Sex-Intimacy & Heart Disease, Get SMART - Goal Setting: - Group verbal and written instruction through game format to discuss heart disease and the return to sexual intimacy. Provides group verbal and written material to discuss and apply goal setting through the application of the S.M.A.R.T. Method.   Cardiac Rehab from 06/06/2017 in [facility] Cardiac and Pulmonary Rehab  Date  05/28/17  Educator  SB  Instruction Review Code  1- Verbalizes Understanding      Other Matters of the Heart: - Provides group verbal, written materials and models to describe Stable Angina and Peripheral Artery. Includes description of the disease process and treatment options available to the cardiac patient.   Cardiac Rehab from 06/06/2017 in [facility] Cardiac and Pulmonary Rehab  Date  05/14/17  Educator  SB  Instruction Review Code  1- Verbalizes Understanding      Exercise &  Equipment Safety: - Individual verbal instruction and demonstration of equipment use and safety with use of the equipment.   Cardiac Rehab from 06/06/2017 in [facility] Cardiac and Pulmonary Rehab  Date  01/22/17  Educator  [facility]  Instruction Review Code  1- Verbalizes Understanding      Infection Prevention: - Provides verbal and written material to individual with discussion of infection control including proper hand washing and proper equipment cleaning during exercise session.   Cardiac Rehab from 06/06/2017 in [facility] Cardiac and Pulmonary Rehab  Date  01/22/17  Educator  [facility]  Instruction Review Code  1- Verbalizes Understanding      Falls Prevention: - Provides verbal and written material to individual with discussion of falls prevention and safety.   Diabetes: - Individual verbal and written instruction to review signs/symptoms of diabetes, desired ranges of glucose level fasting, after meals and with exercise. Acknowledge that pre and post exercise glucose checks will be done for 3 sessions at entry of program.   Know Your Numbers and Risk Factors: -Group verbal and written instruction about important numbers in your health.  Discussion of what are risk factors and how they play a role in the disease process.  Review of Cholesterol, Blood Pressure, Diabetes, and BMI and the role they play in your overall health.   Cardiac Rehab from 06/06/2017 in [facility] Cardiac and Pulmonary Rehab  Date  05/09/17  Educator  [facility]  Instruction Review Code  1- Verbalizes Understanding      Sleep Hygiene: -Provides group verbal and written instruction about how sleep can affect your health.  Define sleep hygiene, discuss sleep cycles and impact of sleep habits. Review good sleep hygiene tips.    Cardiac Rehab from 06/06/2017 in [facility] Cardiac and Pulmonary Rehab  Date  05/30/17  Educator  [facility]  Instruction Review Code  1- Verbalizes Understanding      Other: -Provides group and verbal instruction on various  topics (see comments)   Cardiac Rehab from 06/06/2017 in [facility] Cardiac and Pulmonary Rehab  Date  01/24/17  Educator  [facility]  Instruction Review Code  1- Verbalizes Understanding [Risk Factors and Know Your Numbers]      Knowledge Questionnaire Score: Knowledge Questionnaire Score - 06/06/17 1456      Knowledge Questionnaire Score   Pre Score  21/28    Post Score  27/28 reviewed results with pt today       Core Components/Risk Factors/Patient Goals at Admission: Personal Goals and Risk Factors at Admission - 05/03/17 1441      Core Components/Risk Factors/Patient Goals on Admission   Intervention  Weight Management: Develop a  combined nutrition and exercise program designed to reach desired caloric intake, while maintaining appropriate intake of nutrient and fiber, sodium and fats, and appropriate energy expenditure required for the weight goal.;Weight Management/Obesity: Establish reasonable short term and long term weight goals.;Obesity: Provide education and appropriate resources to help participant work on and attain dietary goals.    Admit Weight  197 lb 4.8 oz (89.5 kg)    Goal Weight: Short Term  195 lb (88.5 kg)    Goal Weight: Long Term  175 lb (79.4 kg)    Expected Outcomes  Short Term: Continue to assess and modify interventions until short term weight is achieved;Weight Loss: Understanding of general recommendations for a balanced deficit meal plan, which promotes 1-2 lb weight loss per week and includes a negative energy balance of [phone] kcal/d;Understanding of distribution of calorie intake throughout the day with the consumption of 4-5 meals/snacks;Understanding recommendations for meals to include 15-35% energy as protein, 25-35% energy from fat, 35-60% energy from carbohydrates, less than 25m of dietary cholesterol, 20-35 gm of total fiber daily    Improve shortness of breath with ADL's  Yes    Intervention  Provide education, individualized exercise plan and daily activity  instruction to help decrease symptoms of SOB with activities of daily living.    Expected Outcomes  Short Term: Improve cardiorespiratory fitness to achieve a reduction of symptoms when performing ADLs;Long Term: Be able to perform more ADLs without symptoms or delay the onset of symptoms    Diabetes  Yes    Intervention  Provide education about signs/symptoms and action to take for hypo/hyperglycemia.;Provide education about proper nutrition, including hydration, and aerobic/resistive exercise prescription along with prescribed medications to achieve blood glucose in normal ranges: Fasting glucose 65-99 mg/dL    Expected Outcomes  Short Term: Participant verbalizes understanding of the signs/symptoms and immediate care of hyper/hypoglycemia, proper foot care and importance of medication, aerobic/resistive exercise and nutrition plan for blood glucose control.;Long Term: Attainment of HbA1C < 7%.    Hypertension  Yes    Intervention  Provide education on lifestyle modifcations including regular physical activity/exercise, weight management, moderate sodium restriction and increased consumption of fresh fruit, vegetables, and low fat dairy, alcohol moderation, and smoking cessation.;Monitor prescription use compliance.    Expected Outcomes  Short Term: Continued assessment and intervention until BP is < 140/965mHG in hypertensive participants. < 130/8043mG in hypertensive participants with diabetes, heart failure or chronic kidney disease.;Long Term: Maintenance of blood pressure at goal levels.    Lipids  Yes    Intervention  Provide education and support for participant on nutrition & aerobic/resistive exercise along with prescribed medications to achieve LDL <70m64mDL >40mg12m Expected Outcomes  Short Term: Participant states understanding of desired cholesterol values and is compliant with medications prescribed. Participant is following exercise prescription and nutrition guidelines.;Long Term:  Cholesterol controlled with medications as prescribed, with individualized exercise RX and with personalized nutrition plan. Value goals: LDL < 70mg,31m > 40 mg.    Stress  Yes Family stress is a concern for HaroldCentex CorporationIntervention  Offer individual and/or small group education and counseling on adjustment to heart disease, stress management and health-related lifestyle change. Teach and support self-help strategies.;Refer participants experiencing significant psychosocial distress to appropriate mental health specialists for further evaluation and treatment. When possible, include family members and significant others in education/counseling sessions.    Expected Outcomes  Short Term: Participant demonstrates changes in health-related behavior, relaxation and  other stress management skills, ability to obtain effective social support, and compliance with psychotropic medications if prescribed.;Long Term: Emotional wellbeing is indicated by absence of clinically significant psychosocial distress or social isolation.       Core Components/Risk Factors/Patient Goals Review:  Goals and Risk Factor Review    Row Name 01/31/17 0847 05/25/17 1422           Core Components/Risk Factors/Patient Goals Review   Personal Goals Review  Weight Management/Obesity;Hypertension;Diabetes;Lipids;Heart Failure  Weight Management/Obesity;Hypertension;Diabetes;Lipids;Heart Failure      Review  Ronald Wallace has been doing well in rehab.  He noticed that his weight was up a little today and admitted to eating more since his  wife was out.  He weighs daily to monitor his heart failure and has not had any symptoms.  He also monitors his salt intake.  His blood sugars have been pretty good on average. He has been watching what he eats.  He has noticed that his sugars are better when he exercises.  His wife is also diabetic and they work together to monitor their sugars.  His blood pressures have been pretty good, he checks them  about  every other day.  He feels that his medications are working for him.   Ronald Wallace continues to do well in rehab.  His weight is starting to come back down again.  He was down to 196 lbs today.  His blood pressures have continued to be good and his wife checks and records them daily.  They also monitor his blood sugars regularly.  His sugars have been running high 220-300 at home.  They did change his insulin on Tuesday for better control.       Expected Outcomes  Short: Continue to work on weight loss.  Long: Contiue to manage diabetes.   Short: Continue to work on better blood sugar control.   Long: Continue to work on risk factor modifications.          Core Components/Risk Factors/Patient Goals at Discharge (Final Review):  Goals and Risk Factor Review - 05/25/17 1422      Core Components/Risk Factors/Patient Goals Review   Personal Goals Review  Weight Management/Obesity;Hypertension;Diabetes;Lipids;Heart Failure    Review  Ronald Wallace continues to do well in rehab.  His weight is starting to come back down again.  He was down to 196 lbs today.  His blood pressures have continued to be good and his wife checks and records them daily.  They also monitor his blood sugars regularly.  His sugars have been running high 220-300 at home.  They did change his insulin on Tuesday for better control.     Expected Outcomes  Short: Continue to work on better blood sugar control.   Long: Continue to work on risk factor modifications.        ITP Comments: ITP Comments    Row Name 12/14/16 1321 12/14/16 1324 12/20/16 0545 01/10/17 1510 01/17/17 0630   ITP Comments  ITP created during Medical Review/Orientation appt after Cardiac REhab informed consent was signed.   Stable angina note documented in Dr. Donivan Scull 11/27/2016 note  30 day review. Continue with ITP unless directed changes per Medical Director review.   After some dermatology surgery, [name]'s doctor requested that he stay out until after the new year.    30 day review. Continue with ITP unless directed changes per Medical Director review.   1 visit since last review   Row Name 02/08/17 1123 02/14/17 0557 02/20/17 1519 03/05/17 1551 03/14/17  0642   ITP Comments  Mrs. Stender called to let us know that Ronald Wallace has been out with shingles in his ear.  We are going to give a couple weeks to see if he gets any better.   30 Day review. Continue with ITP unless directed changes per Medical Director review.   Called to check on Ronald Wallace with his Shingles. He is still taking his medications and has some open wounds.  Talked about making sure he finishes his medications and all wounds are sealed before he is able to return to rehab.   Called to check on status of return.  He continues to have pain from Shingles.  The rash has improved per office notes but he says the pain is still in his back.  We will continue to touch base with him about his progress.  30 day review completed. Continue with ITP unless diercted changes per Medical Director. Remains out this month with medical reason   Row Name 03/21/17 1439 04/02/17 1500 05/03/17 1432 05/09/17 0607 06/06/17 0541   ITP Comments  Called to check on pt status to return to rehab.  Spoke with his wife.  His wounds have all dried up and scabbed over.  He hopes to return on Monday.   Ronald Wallace called this morning with questions about whether or not he should return.  He has finally recovered from his shingles, but now is experiencing a lot of pain.  We are going to discharge him at this time as he has been out for almost two months and have him return under a new order and new walk test to finish the program.   Ronald Wallace returned to day to resume his cardiac rehab after discharging for lengthy time out because of medicl reason. ITP created and will be sent to Dr Loleta Chance for review, changes as needed and signature. Documentation of diagnosis can be found in [facility] encounter11/02/2016  30 day review. Continue with ITP unless directed changes  per Medical Director  30 day review. Continue with ITP unless directed changes per Medical Director   [place] Name 06/06/17 [phone]           ITP Comments  Discharge ITP sent and signed by Dr. Sabra Heck.  Discharge Summary routed to PCP and cardiologist.          Comments: Discharge ITP

## 2017-06-06 NOTE — Progress Notes (Signed)
Discharge Progress Report  Patient Details  Name: Ronald Wallace MRN: [id number] Date of Birth: [birth year] Referring Provider:     Cardiac Rehab from 05/03/2017 in [facility]  Referring Provider  [name]       Number of Visits: 52  Reason for Discharge:  Patient reached a stable level of exercise. Patient independent in their exercise. Patient has met program and personal goals.  Smoking History:  Social History   Tobacco Use  Smoking Status Never Smoker  Smokeless Tobacco Never Used    Diagnosis:  Stable angina ([place])  ADL [facility]:   Initial Exercise Prescription: Initial Exercise Prescription - 05/03/17 1400      Date of Initial Exercise RX and Referring Provider   Date  05/03/17    Referring Provider  [name]      Treadmill   MPH  2.5    Grade  0    Minutes  15    METs  2.77      NuStep   Level  2    SPM  80    Minutes  15    METs  2.7      REL-XR   Level  2    Speed  50    Minutes  15    METs  2.7      Prescription Details   Frequency (times per week)  3    Duration  Progress to 45 minutes of aerobic exercise without signs/symptoms of physical distress      Intensity   THRR 40-80% of Max Heartrate  103-131    Ratings of Perceived Exertion  11-13    Perceived Dyspnea  0-4      Resistance Training   Training Prescription  Yes    Weight  3 lb    Reps  10-15       Discharge Exercise Prescription (Final Exercise Prescription Changes): Exercise Prescription Changes - 05/28/17 1400      Response to Exercise   Blood Pressure (Admit)  130/80    Blood Pressure (Exercise)  140/80    Blood Pressure (Exit)  124/64    Heart Rate (Admit)  67 bpm    Heart Rate (Exercise)  116 bpm    Heart Rate (Exit)  70 bpm    Rating of Perceived Exertion (Exercise)  12    Symptoms  none    Duration  Continue with 45 min of aerobic exercise without signs/symptoms of physical distress.    Intensity  THRR unchanged      Progression    Progression  Continue to progress workloads to maintain intensity without signs/symptoms of physical distress.    Average METs  3.54      Resistance Training   Training Prescription  Yes    Weight  3 lb    Reps  10-15      Interval Training   Interval Training  No      Treadmill   MPH  2.5    Grade  0    Minutes  15    METs  2.91      NuStep   Level  4    Minutes  15    METs  2.7      REL-XR   Level  2    Minutes  15    METs  5      Home Exercise Plan   Plans to continue exercise at  [employer] (comment) walking, treadmill, [employer]  Frequency  Add 2 additional days to program exercise sessions.    Initial Home Exercises Provided  01/31/17       Functional Capacity: 6 Minute Walk    Row Name 12/14/16 1501 05/03/17 1421 05/30/17 0903     6 Minute Walk   Phase  -  -  Discharge   Distance  1488 feet  1355 feet  1695 feet   Distance % Change  -  -  25.1 %   Distance Feet Change  -  -  340 ft   Walk Time  6 minutes  6 minutes  6 minutes   # of Rest Breaks  0  0  0   MPH  2.82  2.56  3.21   METS  3.25  2.77  3.3   RPE  9  13  15    Perceived Dyspnea   3  1  -   VO2 Peak  11.36  9.72  11.56   Symptoms  No  Yes (comment)  No   Comments  -  dizziness  -   Resting HR  63 bpm  75 bpm  68 bpm   Resting BP  132/64  108/60  126/60   Resting Oxygen Saturation   96 %  -  -   Exercise Oxygen Saturation  during 6 min walk  95 %  97 %  -   Max Ex. HR  129 bpm  120 bpm  86 bpm   Max Ex. BP  162/64  132/52  174/82   2 Minute Post BP  130/60  122/64  -      Psychological, QOL, Others - Outcomes: PHQ 2/9: Depression screen [facility] 2/9 06/06/2017 05/03/2017 01/24/2017 12/14/2016 12/14/2016  Decreased Interest 0 2 1 - 0  Down, Depressed, Hopeless 1 0 1 - 2  PHQ - 2 Score 1 2 2  - 2  Altered sleeping 0 0 0 - 3  Tired, decreased energy 1 2 1  - 2  Change in appetite 1 0 0 - 0  Feeling bad or failure about yourself  1 2 0 - 1  Trouble concentrating 1 0 0 - 3  Moving slowly or  fidgety/restless 0 0 0 - 0  Suicidal thoughts 0 0 0 0 1  PHQ-9 Score 5 6 3  - 12  Difficult doing work/chores Somewhat difficult Somewhat difficult Not difficult at all - -    Quality of Life: Quality of Life - 06/06/17 1456      Quality of Life Scores   Health/Function Pre  18.37 %    Health/Function Post  20 %    Health/Function % Change  8.87 %    Socioeconomic Pre  23.71 %    Socioeconomic Post  25 %    Socioeconomic % Change   5.44 %    Psych/Spiritual Pre  24.43 %    Psych/Spiritual Post  24 %    Psych/Spiritual % Change  -1.76 %    Family Pre  22.8 %    Family Post  18 %    Family % Change  -21.05 %    GLOBAL Pre  18.37 %    GLOBAL Post  21.5 %    GLOBAL % Change  17.04 %       Personal Goals: Goals established at orientation with interventions provided to work toward goal. Personal Goals and Risk Factors at Admission - 05/03/17 1441      Core Components/Risk Factors/Patient Goals on Admission  Intervention  Weight Management: Develop a combined nutrition and exercise program designed to reach desired caloric intake, while maintaining appropriate intake of nutrient and fiber, sodium and fats, and appropriate energy expenditure required for the weight goal.;Weight Management/Obesity: Establish reasonable short term and long term weight goals.;Obesity: Provide education and appropriate resources to help participant work on and attain dietary goals.    Admit Weight  197 lb 4.8 oz (89.5 kg)    Goal Weight: Short Term  195 lb (88.5 kg)    Goal Weight: Long Term  175 lb (79.4 kg)    Expected Outcomes  Short Term: Continue to assess and modify interventions until short term weight is achieved;Weight Loss: Understanding of general recommendations for a balanced deficit meal plan, which promotes 1-2 lb weight loss per week and includes a negative energy balance of [phone] kcal/d;Understanding of distribution of calorie intake throughout the day with the consumption of 4-5  meals/snacks;Understanding recommendations for meals to include 15-35% energy as protein, 25-35% energy from fat, 35-60% energy from carbohydrates, less than 2109m of dietary cholesterol, 20-35 gm of total fiber daily    Improve shortness of breath with ADL's  Yes    Intervention  Provide education, individualized exercise plan and daily activity instruction to help decrease symptoms of SOB with activities of daily living.    Expected Outcomes  Short Term: Improve cardiorespiratory fitness to achieve a reduction of symptoms when performing ADLs;Long Term: Be able to perform more ADLs without symptoms or delay the onset of symptoms    Diabetes  Yes    Intervention  Provide education about signs/symptoms and action to take for hypo/hyperglycemia.;Provide education about proper nutrition, including hydration, and aerobic/resistive exercise prescription along with prescribed medications to achieve blood glucose in normal ranges: Fasting glucose 65-99 mg/dL    Expected Outcomes  Short Term: Participant verbalizes understanding of the signs/symptoms and immediate care of hyper/hypoglycemia, proper foot care and importance of medication, aerobic/resistive exercise and nutrition plan for blood glucose control.;Long Term: Attainment of HbA1C < 7%.    Hypertension  Yes    Intervention  Provide education on lifestyle modifcations including regular physical activity/exercise, weight management, moderate sodium restriction and increased consumption of fresh fruit, vegetables, and low fat dairy, alcohol moderation, and smoking cessation.;Monitor prescription use compliance.    Expected Outcomes  Short Term: Continued assessment and intervention until BP is < 140/927mHG in hypertensive participants. < 130/8068mG in hypertensive participants with diabetes, heart failure or chronic kidney disease.;Long Term: Maintenance of blood pressure at goal levels.    Lipids  Yes    Intervention  Provide education and support for  participant on nutrition & aerobic/resistive exercise along with prescribed medications to achieve LDL <67m77mDL >40mg60m Expected Outcomes  Short Term: Participant states understanding of desired cholesterol values and is compliant with medications prescribed. Participant is following exercise prescription and nutrition guidelines.;Long Term: Cholesterol controlled with medications as prescribed, with individualized exercise RX and with personalized nutrition plan. Value goals: LDL < 67mg,44m > 40 mg.    Stress  Yes Family stress is a concern for Ronald CorporationIntervention  Offer individual and/or small group education and counseling on adjustment to heart disease, stress management and health-related lifestyle change. Teach and support self-help strategies.;Refer participants experiencing significant psychosocial distress to appropriate mental health specialists for further evaluation and treatment. When possible, include family members and significant others in education/counseling sessions.    Expected Outcomes  Short Term: Participant demonstrates  changes in health-related behavior, relaxation and other stress management skills, ability to obtain effective social support, and compliance with psychotropic medications if prescribed.;Long Term: Emotional wellbeing is indicated by absence of clinically significant psychosocial distress or social isolation.        Personal Goals Discharge: Goals and Risk Factor Review    Row Name 01/31/17 0847 05/25/17 1422           Core Components/Risk Factors/Patient Goals Review   Personal Goals Review  Weight Management/Obesity;Hypertension;Diabetes;Lipids;Heart Failure  Weight Management/Obesity;Hypertension;Diabetes;Lipids;Heart Failure      Review  Ronald Wallace has been doing well in rehab.  He noticed that his weight was up a little today and admitted to eating more since his  wife was out.  He weighs daily to monitor his heart failure and has not had any  symptoms.  He also monitors his salt intake.  His blood sugars have been pretty good on average. He has been watching what he eats.  He has noticed that his sugars are better when he exercises.  His wife is also diabetic and they work together to monitor their sugars.  His blood pressures have been pretty good, he checks them about  every other day.  He feels that his medications are working for him.   Ronald Wallace continues to do well in rehab.  His weight is starting to come back down again.  He was down to 196 lbs today.  His blood pressures have continued to be good and his wife checks and records them daily.  They also monitor his blood sugars regularly.  His sugars have been running high 220-300 at home.  They did change his insulin on Tuesday for better control.       Expected Outcomes  Short: Continue to work on weight loss.  Long: Contiue to manage diabetes.   Short: Continue to work on better blood sugar control.   Long: Continue to work on risk factor modifications.          Exercise Goals and Review: Exercise Goals    Row Name 12/14/16 1500             Exercise Goals   Increase Physical Activity  Yes       Intervention  Provide advice, education, support and counseling about physical activity/exercise needs.;Develop an individualized exercise prescription for aerobic and resistive training based on initial evaluation findings, risk stratification, comorbidities and participant's personal goals.       Expected Outcomes  Achievement of increased cardiorespiratory fitness and enhanced flexibility, muscular endurance and strength shown through measurements of functional capacity and personal statement of participant.       Increase Strength and Stamina  Yes       Intervention  Provide advice, education, support and counseling about physical activity/exercise needs.;Develop an individualized exercise prescription for aerobic and resistive training based on initial evaluation findings, risk  stratification, comorbidities and participant's personal goals.       Expected Outcomes  Achievement of increased cardiorespiratory fitness and enhanced flexibility, muscular endurance and strength shown through measurements of functional capacity and personal statement of participant.       Able to understand and use rate of perceived exertion (RPE) scale  Yes       Intervention  Provide education and explanation on how to use RPE scale       Expected Outcomes  Short Term: Able to use RPE daily in rehab to express subjective intensity level;Long Term:  Able to use RPE to guide  intensity level when exercising independently       Able to understand and use Dyspnea scale  Yes       Intervention  Provide education and explanation on how to use Dyspnea scale       Expected Outcomes  Short Term: Able to use Dyspnea scale daily in rehab to express subjective sense of shortness of breath during exertion;Long Term: Able to use Dyspnea scale to guide intensity level when exercising independently       Knowledge and understanding of Target Heart Rate Range (THRR)  Yes       Intervention  Provide education and explanation of THRR including how the numbers were predicted and where they are located for reference       Expected Outcomes  Short Term: Able to state/look up THRR;Long Term: Able to use THRR to govern intensity when exercising independently;Short Term: Able to use daily as guideline for intensity in rehab       Able to check pulse independently  Yes       Intervention  Provide education and demonstration on how to check pulse in carotid and radial arteries.;Review the importance of being able to check your own pulse for safety during independent exercise       Expected Outcomes  Short Term: Able to explain why pulse checking is important during independent exercise;Long Term: Able to check pulse independently and accurately       Understanding of Exercise Prescription  Yes       Intervention  Provide  education, explanation, and written materials on patient's individual exercise prescription       Expected Outcomes  Short Term: Able to explain program exercise prescription;Long Term: Able to explain home exercise prescription to exercise independently          Nutrition & Weight - Outcomes: Pre Biometrics - 05/03/17 1420      Pre Biometrics   Height  5' 7.75" (1.721 m)    Weight  197 lb 4.8 oz (89.5 kg)    Waist Circumference  44 inches    Hip Circumference  39 inches    Waist to Hip Ratio  1.13 %    BMI (Calculated)  30.22    Single Leg Stand  1.27 seconds      Post Biometrics - 05/30/17 0905       Post  Biometrics   Height  5' 7.75" (1.721 m)    Weight  197 lb 3.2 oz (89.4 kg)    Waist Circumference  39.5 inches    Hip Circumference  38 inches    Waist to Hip Ratio  1.04 %    BMI (Calculated)  30.2    Single Leg Stand  4.45 seconds       Nutrition: Nutrition Therapy & Goals - 06/06/17 1003      Nutrition Therapy   Diet  Diabetic    Drug/Food Interactions  Statins/Certain Fruits    Protein (specify units)  12oz    Fiber  35 grams    Whole Grain Foods  3 servings    Saturated Fats  15 max. grams    Fruits and Vegetables  6 servings/day 8 ideal    Sodium  1500 grams      Personal Nutrition Goals   Nutrition Goal  For snacks, either choose sugar free varieties of items like candy, or choose a more nutritious snack that is a combination of carbohydrate + protein (nabs for example). If you are truely hungry, the  carbohydrate + protein option will be more satisfying and filling.    Personal Goal #2  Consume a protein source at each meal, and ideally as part of your snacks as well. This can include non-animal based sources of protein like beans and dairy products    Personal Goal #3  Eat at regular time intervals throughout the day    Comments  His wife is also a diabetic and she cooks for both of them. They have cut out salt, eat a variety of fruits & vegetables, and do  not go out to eat often.      Intervention Plan   Intervention  Prescribe, educate and counsel regarding individualized specific dietary modifications aiming towards targeted core components such as weight, hypertension, lipid management, diabetes, heart failure and other comorbidities.    Expected Outcomes  Short Term Goal: Understand basic principles of dietary content, such as calories, fat, sodium, cholesterol and nutrients.;Short Term Goal: A plan has been developed with personal nutrition goals set during dietitian appointment.;Long Term Goal: Adherence to prescribed nutrition plan.       Nutrition Discharge: Nutrition Assessments - 06/06/17 1456      MEDFICTS Scores   Pre Score  52    Post Score  -- did not complete       Education Questionnaire Score: Knowledge Questionnaire Score - 06/06/17 1456      Knowledge Questionnaire Score   Pre Score  21/28    Post Score  27/28 reviewed results with pt today       Goals reviewed with patient; copy given to patient.

## 2017-06-06 NOTE — Patient Instructions (Signed)
Discharge Patient Instructions  Patient Details  Name: Ronald Wallace MRN: [id number] Date of Birth: 11-Nov-1940 Referring Provider:  [name], MD   Number of Visits: 29  Reason for Discharge:  Patient reached a stable level of exercise. Patient independent in their exercise. Patient has met program and personal goals.  Smoking History:  Social History   Tobacco Use  Smoking Status Never Smoker  Smokeless Tobacco Never Used    Diagnosis:  No diagnosis found.  Initial Exercise Prescription: Initial Exercise Prescription - 05/03/17 1400      Date of Initial Exercise RX and Referring Provider   Date  05/03/17    Referring Provider  [name]      Treadmill   MPH  2.5    Grade  0    Minutes  15    METs  2.77      NuStep   Level  2    SPM  80    Minutes  15    METs  2.7      REL-XR   Level  2    Speed  50    Minutes  15    METs  2.7      Prescription Details   Frequency (times per week)  3    Duration  Progress to 45 minutes of aerobic exercise without signs/symptoms of physical distress      Intensity   THRR 40-80% of Max Heartrate  103-131    Ratings of Perceived Exertion  11-13    Perceived Dyspnea  0-4      Resistance Training   Training Prescription  Yes    Weight  3 lb    Reps  10-15       Discharge Exercise Prescription (Final Exercise Prescription Changes): Exercise Prescription Changes - 05/28/17 1400      Response to Exercise   Blood Pressure (Admit)  130/80    Blood Pressure (Exercise)  140/80    Blood Pressure (Exit)  124/64    Heart Rate (Admit)  67 bpm    Heart Rate (Exercise)  116 bpm    Heart Rate (Exit)  70 bpm    Rating of Perceived Exertion (Exercise)  12    Symptoms  none    Duration  Continue with 45 min of aerobic exercise without signs/symptoms of physical distress.    Intensity  THRR unchanged      Progression   Progression  Continue to progress workloads to maintain intensity without signs/symptoms of  physical distress.    Average METs  3.54      Resistance Training   Training Prescription  Yes    Weight  3 lb    Reps  10-15      Interval Training   Interval Training  No      Treadmill   MPH  2.5    Grade  0    Minutes  15    METs  2.91      NuStep   Level  4    Minutes  15    METs  2.7      REL-XR   Level  2    Minutes  15    METs  5      Home Exercise Plan   Plans to continue exercise at  [employer] (comment) walking, treadmill, [employer]    Frequency  Add 2 additional days to program exercise sessions.    Initial Home Exercises Provided  01/31/17  Functional Capacity: 6 Minute Walk    Row Name 12/14/16 1501 05/03/17 1421 05/30/17 0903     6 Minute Walk   Phase  -  -  Discharge   Distance  1488 feet  1355 feet  1695 feet   Distance % Change  -  -  25.1 %   Distance Feet Change  -  -  340 ft   Walk Time  6 minutes  6 minutes  6 minutes   # of Rest Breaks  0  0  0   MPH  2.82  2.56  3.21   METS  3.25  2.77  3.3   RPE  _0 Perceived Dyspnea   3  1  -   VO2 Peak  11.36  9.72  11.56   Symptoms  No  Yes (comment)  No   Comments  -  dizziness  -   Resting HR  63 bpm  75 bpm  68 bpm   Resting BP  132/64  108/60  126/60   Resting Oxygen Saturation   96 %  -  -   Exercise Oxygen Saturation  during 6 min walk  95 %  97 %  -   Max Ex. HR  129 bpm  120 bpm  86 bpm   Max Ex. BP  162/64  132/52  174/82   2 Minute Post BP  130/60  122/64  -      Quality of Life: Quality of Life - 05/03/17 1445      Quality of Life Scores   Health/Function Pre  18.37 %    Socioeconomic Pre  23.71 %    Psych/Spiritual Pre  24.43 %    Family Pre  22.8 %    GLOBAL Pre  18.37 %       Personal Goals: Goals established at orientation with interventions provided to work toward goal. Personal Goals and Risk Factors at Admission - 05/03/17 1441      Core Components/Risk Factors/Patient Goals on Admission   Intervention  Weight Management: Develop a combined  nutrition and exercise program designed to reach desired caloric intake, while maintaining appropriate intake of nutrient and fiber, sodium and fats, and appropriate energy expenditure required for the weight goal.;Weight Management/Obesity: Establish reasonable short term and long term weight goals.;Obesity: Provide education and appropriate resources to help participant work on and attain dietary goals.    Admit Weight  197 lb 4.8 oz (89.5 kg)    Goal Weight: Short Term  195 lb (88.5 kg)    Goal Weight: Long Term  175 lb (79.4 kg)    Expected Outcomes  Short Term: Continue to assess and modify interventions until short term weight is achieved;Weight Loss: Understanding of general recommendations for a balanced deficit meal plan, which promotes 1-2 lb weight loss per week and includes a negative energy balance of [phone] kcal/d;Understanding of distribution of calorie intake throughout the day with the consumption of 4-5 meals/snacks;Understanding recommendations for meals to include 15-35% energy as protein, 25-35% energy from fat, 35-60% energy from carbohydrates, less than 275m of dietary cholesterol, 20-35 gm of total fiber daily    Improve shortness of breath with ADL's  Yes    Intervention  Provide education, individualized exercise plan and daily activity instruction to help decrease symptoms of SOB with activities of daily living.    Expected Outcomes  Short Term: Improve cardiorespiratory fitness to achieve a reduction of symptoms when performing ADLs;Long  Term: Be able to perform more ADLs without symptoms or delay the onset of symptoms    Diabetes  Yes    Intervention  Provide education about signs/symptoms and action to take for hypo/hyperglycemia.;Provide education about proper nutrition, including hydration, and aerobic/resistive exercise prescription along with prescribed medications to achieve blood glucose in normal ranges: Fasting glucose 65-99 mg/dL    Expected Outcomes  Short Term:  Participant verbalizes understanding of the signs/symptoms and immediate care of hyper/hypoglycemia, proper foot care and importance of medication, aerobic/resistive exercise and nutrition plan for blood glucose control.;Long Term: Attainment of HbA1C < 7%.    Hypertension  Yes    Intervention  Provide education on lifestyle modifcations including regular physical activity/exercise, weight management, moderate sodium restriction and increased consumption of fresh fruit, vegetables, and low fat dairy, alcohol moderation, and smoking cessation.;Monitor prescription use compliance.    Expected Outcomes  Short Term: Continued assessment and intervention until BP is < 140/85m HG in hypertensive participants. < 130/882mHG in hypertensive participants with diabetes, heart failure or chronic kidney disease.;Long Term: Maintenance of blood pressure at goal levels.    Lipids  Yes    Intervention  Provide education and support for participant on nutrition & aerobic/resistive exercise along with prescribed medications to achieve LDL '70mg'$ , HDL >'40mg'$ .    Expected Outcomes  Short Term: Participant states understanding of desired cholesterol values and is compliant with medications prescribed. Participant is following exercise prescription and nutrition guidelines.;Long Term: Cholesterol controlled with medications as prescribed, with individualized exercise RX and with personalized nutrition plan. Value goals: LDL < '70mg'$ , HDL > 40 mg.    Stress  Yes Family stress is a concern for [employer]    Intervention  Offer individual and/or small group education and counseling on adjustment to heart disease, stress management and health-related lifestyle change. Teach and support self-help strategies.;Refer participants experiencing significant psychosocial distress to appropriate mental health specialists for further evaluation and treatment. When possible, include family members and significant others in education/counseling  sessions.    Expected Outcomes  Short Term: Participant demonstrates changes in health-related behavior, relaxation and other stress management skills, ability to obtain effective social support, and compliance with psychotropic medications if prescribed.;Long Term: Emotional wellbeing is indicated by absence of clinically significant psychosocial distress or social isolation.        Personal Goals Discharge: Goals and Risk Factor Review - 05/25/17 1422      Core Components/Risk Factors/Patient Goals Review   Personal Goals Review  Weight Management/Obesity;Hypertension;Diabetes;Lipids;Heart Failure    Review  HaJoneen Boersontinues to do well in rehab.  His weight is starting to come back down again.  He was down to 196 lbs today.  His blood pressures have continued to be good and his wife checks and records them daily.  They also monitor his blood sugars regularly.  His sugars have been running high 220-300 at home.  They did change his insulin on Tuesday for better control.     Expected Outcomes  Short: Continue to work on better blood sugar control.   Long: Continue to work on risk factor modifications.        Exercise Goals and Review: Exercise Goals    Row Name 12/14/16 1500             Exercise Goals   Increase Physical Activity  Yes       Intervention  Provide advice, education, support and counseling about physical activity/exercise needs.;Develop an individualized exercise prescription for aerobic and resistive  training based on initial evaluation findings, risk stratification, comorbidities and participant's personal goals.       Expected Outcomes  Achievement of increased cardiorespiratory fitness and enhanced flexibility, muscular endurance and strength shown through measurements of functional capacity and personal statement of participant.       Increase Strength and Stamina  Yes       Intervention  Provide advice, education, support and counseling about physical activity/exercise  needs.;Develop an individualized exercise prescription for aerobic and resistive training based on initial evaluation findings, risk stratification, comorbidities and participant's personal goals.       Expected Outcomes  Achievement of increased cardiorespiratory fitness and enhanced flexibility, muscular endurance and strength shown through measurements of functional capacity and personal statement of participant.       Able to understand and use rate of perceived exertion (RPE) scale  Yes       Intervention  Provide education and explanation on how to use RPE scale       Expected Outcomes  Short Term: Able to use RPE daily in rehab to express subjective intensity level;Long Term:  Able to use RPE to guide intensity level when exercising independently       Able to understand and use Dyspnea scale  Yes       Intervention  Provide education and explanation on how to use Dyspnea scale       Expected Outcomes  Short Term: Able to use Dyspnea scale daily in rehab to express subjective sense of shortness of breath during exertion;Long Term: Able to use Dyspnea scale to guide intensity level when exercising independently       Knowledge and understanding of Target Heart Rate Range (THRR)  Yes       Intervention  Provide education and explanation of THRR including how the numbers were predicted and where they are located for reference       Expected Outcomes  Short Term: Able to state/look up THRR;Long Term: Able to use THRR to govern intensity when exercising independently;Short Term: Able to use daily as guideline for intensity in rehab       Able to check pulse independently  Yes       Intervention  Provide education and demonstration on how to check pulse in carotid and radial arteries.;Review the importance of being able to check your own pulse for safety during independent exercise       Expected Outcomes  Short Term: Able to explain why pulse checking is important during independent exercise;Long  Term: Able to check pulse independently and accurately       Understanding of Exercise Prescription  Yes       Intervention  Provide education, explanation, and written materials on patient's individual exercise prescription       Expected Outcomes  Short Term: Able to explain program exercise prescription;Long Term: Able to explain home exercise prescription to exercise independently          Nutrition & Weight - Outcomes: Pre Biometrics - 05/03/17 1420      Pre Biometrics   Height  5' 7.75" (1.721 m)    Weight  197 lb 4.8 oz (89.5 kg)    Waist Circumference  44 inches    Hip Circumference  39 inches    Waist to Hip Ratio  1.13 %    BMI (Calculated)  30.22    Single Leg Stand  1.27 seconds      Post Biometrics - 05/30/17 7943  Post  Biometrics   Height  5' 7.75" (1.721 m)    Weight  197 lb 3.2 oz (89.4 kg)    Waist Circumference  39.5 inches    Hip Circumference  38 inches    Waist to Hip Ratio  1.04 %    BMI (Calculated)  30.2    Single Leg Stand  4.45 seconds       Nutrition: Nutrition Therapy & Goals - 05/03/17 1441      Intervention Plan   Intervention  Prescribe, educate and counsel regarding individualized specific dietary modifications aiming towards targeted core components such as weight, hypertension, lipid management, diabetes, heart failure and other comorbidities.    Expected Outcomes  Short Term Goal: Understand basic principles of dietary content, such as calories, fat, sodium, cholesterol and nutrients.;Short Term Goal: A plan has been developed with personal nutrition goals set during dietitian appointment.;Long Term Goal: Adherence to prescribed nutrition plan.       Nutrition Discharge: Nutrition Assessments - 05/03/17 1441      MEDFICTS Scores   Pre Score  52       Education Questionnaire Score: Knowledge Questionnaire Score - 05/03/17 1452      Knowledge Questionnaire Score   Pre Score  21/28 REviewed correct responses with [name]  today. He verbalized understanding and had no further questions       Goals reviewed with patient; copy given to patient.

## 2017-06-06 NOTE — Progress Notes (Signed)
Daily Session Note  Patient Details  Name: Ronald Wallace MRN: [id number] Date of Birth: 1940-08-07 Referring Provider:     Cardiac Rehab from 05/03/2017 in [facility]  Referring Provider  [name]      Encounter Date: 06/06/2017  Check In: Session Check In - 06/06/17 0915      Check-In   Location  [facility]    Staff Present  Heath Lark, RN, BSN, CCRP;Meredith Sherryll Burger, RN BSN;Madalaine Portier Luan Pulling, MA, RCEP, CCRP, Exercise Physiologist    Supervising physician immediately available to respond to emergencies  See telemetry face sheet for immediately available ER MD    Medication changes reported      No    Fall or balance concerns reported     No    Warm-up and Cool-down  Performed on first and last piece of equipment    Resistance Training Performed  Yes    VAD Patient?  No      Pain Assessment   Currently in Pain?  No/denies          Social History   Tobacco Use  Smoking Status Never Smoker  Smokeless Tobacco Never Used    Goals Met:  Independence with exercise equipment Exercise tolerated well Personal goals reviewed No report of cardiac concerns or symptoms Strength training completed today  Goals Unmet:  Not Applicable  Comments: Pt able to follow exercise prescription today without complaint.  Will continue to monitor for progression.  Ronald Wallace graduated today from  rehab with 35 sessions completed.  Details of the patient's exercise prescription and what He needs to do in order to continue the prescription and progress were discussed with patient.  Patient was given a copy of prescription and goals.  Patient verbalized understanding.  Ronald Wallace plans to continue to exercise by walking at home.    Dr. Emily Filbert is Medical Director for [facility].

## 2017-06-06 NOTE — Progress Notes (Signed)
Cardiac Individual Treatment Plan  Patient Details  Name: Ronald Wallace MRN: [id number] Date of Birth: [birth year] Referring Provider:     Cardiac Rehab from 05/03/2017 in [facility] Cardiac and Pulmonary Rehab  Referring Provider  [name]      Initial Encounter Date:    Cardiac Rehab from 05/03/2017 in [facility] Cardiac and Pulmonary Rehab  Date  05/03/17  Referring Provider  Rockey Situ      Visit Diagnosis: Stable angina ([place])  Patient's Home Medications on Admission:  Current Outpatient Medications:  .  aspirin 81 MG tablet, Take 81 mg by mouth daily., Disp: , Rfl:  .  calcium carbonate (OS-CAL) 600 MG TABS tablet, Take 600 mg by mouth daily with breakfast., Disp: , Rfl:  .  cholecalciferol (VITAMIN D) 1000 UNITS tablet, Take 1,000 Units by mouth daily., Disp: , Rfl:  .  Ferrous Sulfate (IRON) 325 (65 FE) MG TABS, Take 1 tablet by mouth daily. , Disp: , Rfl:  .  furosemide (LASIX) 20 MG tablet, Take 1 tablet (20 mg total) by mouth as needed for fluid (for shortness of breath)., Disp: 30 tablet, Rfl: 6 .  gabapentin (NEURONTIN) 300 MG capsule, Take 600 mg by mouth at bedtime., Disp: , Rfl:  .  hydrochlorothiazide (MICROZIDE) 12.5 MG capsule, Take 12.5 mg by mouth daily. , Disp: , Rfl:  .  isosorbide mononitrate (IMDUR) 30 MG 24 hr tablet, Take 1 tablet by mouth once daily, Disp: 90 tablet, Rfl: 0 .  LANTUS 100 UNIT/ML injection, Inject 42 Units into the skin daily. Take at bedtime, Disp: , Rfl:  .  lisinopril (PRINIVIL,ZESTRIL) 20 MG tablet, Take 20 mg by mouth daily., Disp: , Rfl:  .  metFORMIN (GLUCOPHAGE) 500 MG tablet, Take 250 mg by mouth 2 (two) times daily with a meal., Disp: , Rfl:  .  NOVOLOG 100 UNIT/ML injection, Inject into the skin 3 (three) times daily with meals. 12 units am, 12 units lunch, 14 units supper, Disp: , Rfl:  .  simvastatin (ZOCOR) 20 MG tablet, Take 20 mg by mouth daily., Disp: , Rfl:  .  tamsulosin (FLOMAX) 0.4 MG CAPS capsule, Take 0.4 mg by mouth daily., Disp: ,  Rfl:   Past Medical History: Past Medical History:  Diagnosis Date  . Anemia   . CAD (coronary artery disease)    a. 02/2014 Neg Ex MV, EF 65%;  b. 06/2015 Abnl MV ([facility])-->Cath ([facility]): LM nl, LAD 30p/m, LCX 30p, OM2 50p, RCA 100p w/ L-->R collats, nl EF-->Med Rx.  . Diabetes mellitus, type II, insulin dependent ([place])    With neuropathy  . Diverticulitis   . Essential hypertension   . Hyperlipidemia   . Neuropathy   . Shortness of breath dyspnea   . Syncope and collapse    a. 02/2014 Carotid Dopplers: 1-39% bilateral ICA with normal Subclavian & vertebral arteries; b. 02/2014 2 D Echo: EF 50-55%, Gr 1 DD, Mild MR & LA dilation -- was bradycardic in 40s.    Tobacco Use: Social History   Tobacco Use  Smoking Status Never Smoker  Smokeless Tobacco Never Used    Labs: Recent Review Flowsheet Data    There is no flowsheet data to display.       Exercise Target Goals:    Exercise Program Goal: Individual exercise prescription set using results from initial 6 min walk test and THRR while considering  patient's activity barriers and safety.   Exercise Prescription Goal: Initial exercise prescription builds to 30-45 minutes a day of  aerobic activity, 2-3 days per week.  Home exercise guidelines will be given to patient during program as part of exercise prescription that the participant will acknowledge.  Activity Barriers & Risk Stratification: Activity Barriers & Cardiac Risk Stratification - 05/03/17 1439      Activity Barriers & Cardiac Risk Stratification   Activity Barriers  Back Problems;Shortness of Breath    Cardiac Risk Stratification  High       6 Minute Walk: 6 Minute Walk    Row Name 12/14/16 1501 05/03/17 1421 05/30/17 0903     6 Minute Walk   Phase  -  -  Discharge   Distance  1488 feet  1355 feet  1695 feet   Distance % Change  -  -  25.1 %   Distance Feet Change  -  -  340 ft   Walk Time  6 minutes  6 minutes  6 minutes   # of Rest Breaks  0   0  0   MPH  2.82  2.56  3.21   METS  3.25  2.77  3.3   RPE  _0 Perceived Dyspnea   3  1  -   VO2 Peak  11.36  9.72  11.56   Symptoms  No  Yes (comment)  No   Comments  -  dizziness  -   Resting HR  63 bpm  75 bpm  68 bpm   Resting BP  132/64  108/60  126/60   Resting Oxygen Saturation   96 %  -  -   Exercise Oxygen Saturation  during 6 min walk  95 %  97 %  -   Max Ex. HR  129 bpm  120 bpm  86 bpm   Max Ex. BP  162/64  132/52  174/82   2 Minute Post BP  130/60  122/64  -      Oxygen Initial Assessment:   Oxygen Re-Evaluation:   Oxygen Discharge (Final Oxygen Re-Evaluation):   Initial Exercise Prescription: Initial Exercise Prescription - 05/03/17 1400      Date of Initial Exercise RX and Referring Provider   Date  05/03/17    Referring Provider  [name]      Treadmill   MPH  2.5    Grade  0    Minutes  15    METs  2.77      NuStep   Level  2    SPM  80    Minutes  15    METs  2.7      REL-XR   Level  2    Speed  50    Minutes  15    METs  2.7      Prescription Details   Frequency (times per week)  3    Duration  Progress to 45 minutes of aerobic exercise without signs/symptoms of physical distress      Intensity   THRR 40-80% of Max Heartrate  103-131    Ratings of Perceived Exertion  11-13    Perceived Dyspnea  0-4      Resistance Training   Training Prescription  Yes    Weight  3 lb    Reps  10-15       Perform Capillary Blood Glucose checks as needed.  Exercise Prescription Changes: Exercise Prescription Changes    Row Name 12/26/16 1300 01/24/17 1500 01/31/17 0900 02/05/17 1500 05/03/17 1400     Response  to Exercise   Blood Pressure (Admit)  134/70  132/64  -  122/60  108/60   Blood Pressure (Exercise)  134/60  144/52  -  126/74  132/52   Blood Pressure (Exit)  126/54  166/80  -  126/70  122/64   Heart Rate (Admit)  73 bpm  60 bpm  -  65 bpm  75 bpm   Heart Rate (Exercise)  105 bpm  110 bpm  -  107 bpm  120 bpm   Heart Rate  (Exit)  68 bpm  70 bpm  -  62 bpm  73 bpm   Rating of Perceived Exertion (Exercise)  13  14  -  12  13   Symptoms  none  none  -  none  -   Comments  first full day of exercise  third full day of exercise  -  -  -   Duration  Progress to 45 minutes of aerobic exercise without signs/symptoms of physical distress  Progress to 45 minutes of aerobic exercise without signs/symptoms of physical distress  -  Continue with 45 min of aerobic exercise without signs/symptoms of physical distress.  -   Intensity  THRR unchanged  THRR unchanged  -  THRR unchanged  -     Progression   Progression  Continue to progress workloads to maintain intensity without signs/symptoms of physical distress.  Continue to progress workloads to maintain intensity without signs/symptoms of physical distress.  -  Continue to progress workloads to maintain intensity without signs/symptoms of physical distress.  -   Average METs  3.22  2.98  -  3.42  -     Resistance Training   Training Prescription  Yes  Yes  -  Yes  -   Weight  3 lb  3 lb  -  3 lb  -   Reps  10-15  10-15  -  10-15  -     Interval Training   Interval Training  No  No  -  No  -     Treadmill   MPH  2.7  2.7  -  2.9  -   Grade  0.5  0.5  -  1  -   Minutes  15  15  -  15  -   METs  3.25  3.25  -  3.62  -     NuStep   Level  3  3  -  3  -   Minutes  15  15  -  15  -   METs  3  4  -  3.4  -     REL-XR   Level  2  2  -  2  -   Minutes  15  15  -  15  -   METs  3.4  1.7  -  3.4  -     Home Exercise Plan   Plans to continue exercise at  -  -  [employer] (comment) walking, treadmill, [employer] (comment) walking, treadmill, [employer]  -   Frequency  -  -  Add 2 additional days to program exercise sessions.  Add 2 additional days to program exercise sessions.  -   Initial Home Exercises Provided  -  -  01/31/17  01/31/17  -   [place] Name 05/15/17 1100 05/28/17 1400           Response to Exercise   Blood Pressure (Admit)  140/80   130/80      Blood Pressure (Exercise)  138/74  140/80      Blood Pressure (Exit)  126/70  124/64      Heart Rate (Admit)  70 bpm  67 bpm      Heart Rate (Exercise)  94 bpm  116 bpm      Heart Rate (Exit)  68 bpm  70 bpm      Rating of Perceived Exertion (Exercise)  13  12      Symptoms  none  none      Duration  Continue with 45 min of aerobic exercise without signs/symptoms of physical distress.  Continue with 45 min of aerobic exercise without signs/symptoms of physical distress.      Intensity  THRR unchanged  THRR unchanged        Progression   Progression  Continue to progress workloads to maintain intensity without signs/symptoms of physical distress.  Continue to progress workloads to maintain intensity without signs/symptoms of physical distress.      Average METs  3.17  3.54        Resistance Training   Training Prescription  Yes  Yes      Weight  3 lb  3 lb      Reps  10-15  10-15        Interval Training   Interval Training  No  No        Treadmill   MPH  2.5  2.5      Grade  0  0      Minutes  15  15      METs  2.77  2.91        NuStep   Level  2  4      Minutes  15  15      METs  3  2.7        REL-XR   Level  2  2      Minutes  15  15      METs  4.3  5        Home Exercise Plan   Plans to continue exercise at  [employer] (comment) walking, treadmill, [employer] (comment) walking, treadmill, [employer]      Frequency  Add 2 additional days to program exercise sessions.  Add 2 additional days to program exercise sessions.      Initial Home Exercises Provided  01/31/17  01/31/17         Exercise Comments: Exercise Comments    Row Name 12/22/16 5397 05/07/17 0804         Exercise Comments  First full day of exercise!  Patient was oriented to gym and equipment including functions, settings, policies, and procedures.  Patient's individual exercise prescription and treatment plan were reviewed.  All starting workloads were established based on  the results of the 6 minute walk test done at initial orientation visit.  The plan for exercise progression was also introduced and progression will be customized based on patient's performance and goals.  First full day of returning to exercise!  Patient was oriented to gym and equipment including functions, settings, policies, and procedures.  Patient's individual exercise prescription and treatment plan were reviewed.  All starting workloads were established based on the results of the 6 minute walk test done at initial orientation visit.  The plan for exercise progression was also introduced and progression will be customized based on patient's performance and  goals.         Exercise Goals and Review: Exercise Goals    Row Name 12/14/16 1500             Exercise Goals   Increase Physical Activity  Yes       Intervention  Provide advice, education, support and counseling about physical activity/exercise needs.;Develop an individualized exercise prescription for aerobic and resistive training based on initial evaluation findings, risk stratification, comorbidities and participant's personal goals.       Expected Outcomes  Achievement of increased cardiorespiratory fitness and enhanced flexibility, muscular endurance and strength shown through measurements of functional capacity and personal statement of participant.       Increase Strength and Stamina  Yes       Intervention  Provide advice, education, support and counseling about physical activity/exercise needs.;Develop an individualized exercise prescription for aerobic and resistive training based on initial evaluation findings, risk stratification, comorbidities and participant's personal goals.       Expected Outcomes  Achievement of increased cardiorespiratory fitness and enhanced flexibility, muscular endurance and strength shown through measurements of functional capacity and personal statement of participant.       Able to understand  and use rate of perceived exertion (RPE) scale  Yes       Intervention  Provide education and explanation on how to use RPE scale       Expected Outcomes  Short Term: Able to use RPE daily in rehab to express subjective intensity level;Long Term:  Able to use RPE to guide intensity level when exercising independently       Able to understand and use Dyspnea scale  Yes       Intervention  Provide education and explanation on how to use Dyspnea scale       Expected Outcomes  Short Term: Able to use Dyspnea scale daily in rehab to express subjective sense of shortness of breath during exertion;Long Term: Able to use Dyspnea scale to guide intensity level when exercising independently       Knowledge and understanding of Target Heart Rate Range (THRR)  Yes       Intervention  Provide education and explanation of THRR including how the numbers were predicted and where they are located for reference       Expected Outcomes  Short Term: Able to state/look up THRR;Long Term: Able to use THRR to govern intensity when exercising independently;Short Term: Able to use daily as guideline for intensity in rehab       Able to check pulse independently  Yes       Intervention  Provide education and demonstration on how to check pulse in carotid and radial arteries.;Review the importance of being able to check your own pulse for safety during independent exercise       Expected Outcomes  Short Term: Able to explain why pulse checking is important during independent exercise;Long Term: Able to check pulse independently and accurately       Understanding of Exercise Prescription  Yes       Intervention  Provide education, explanation, and written materials on patient's individual exercise prescription       Expected Outcomes  Short Term: Able to explain program exercise prescription;Long Term: Able to explain home exercise prescription to exercise independently          Exercise Goals Re-Evaluation : Exercise Goals  Re-Evaluation    Row Name 12/22/16 [phone] 01/10/17 1511 01/24/17 1528 01/31/17 0847 02/05/17 1537  Exercise Goal Re-Evaluation   Exercise Goals Review  Understanding of Exercise Prescription;Knowledge and understanding of Target Heart Rate Range (THRR);Able to understand and use rate of perceived exertion (RPE) scale  -  Increase Physical Activity;Increase Strength and Stamina;Understanding of Exercise Prescription  Increase Physical Activity;Increase Strength and Stamina;Understanding of Exercise Prescription  Increase Physical Activity;Increase Strength and Stamina;Understanding of Exercise Prescription   Comments  Reviewed RPE scale, THR and program prescription with pt today.  Pt voiced understanding and was given a copy of goals to take home.   After some dermatology surgery, Ronald Wallace's doctor requested that he stay out until after the new year.   Ronald Wallace is off to a good start in rehab.  He has had offically three full days of exercise due to his surgery.  He was able to return at his current workloads without a problem.  We will start to increase his workloads and continue to monitor his progress.   Ronald Wallace is doing well in rehab. He is feeling stronger and has more stamina.   Currently, he has been walking some at home.  Reviewed home exercise with pt today.  Pt plans to walking, treadmill, and returning to [facility]  for exercise.  Reviewed THR, pulse, RPE, sign and symptoms, and when to call 911 or MD.  Also discussed weather considerations and indoor options.  Pt voiced understanding.  Ronald Wallace has been doing well in rehab.  He is now up to 3.4 METs on both the XR and NuStep.  We will continue to monitor his progression.    Expected Outcomes  Short: Use RPE daily to regulate intensity.  Long: Follow program prescription in THR.  -  Short: Increase incline on treadmill and review home exercise.  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   Short: Return to [facility] for exercise.  Long: Continue to  increase strength and stamina.   Short: Increase workloads on XR and NuStep.  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    [place] Name 02/20/17 1519 03/21/17 1439 05/07/17 0804 05/15/17 1119 05/25/17 1426     Exercise Goal Re-Evaluation   Exercise Goals Review  -  -  Increase Physical Activity;Increase Strength and Stamina;Able to understand and use rate of perceived exertion (RPE) scale;Understanding of Exercise Prescription  Increase Physical Activity;Understanding of Exercise Prescription;Increase Strength and Stamina  Increase Physical Activity;Understanding of Exercise Prescription;Increase Strength and Stamina   Comments  Out since last review.  Out since last review.  Reviewed RPE scale, THR and program prescription with pt today.  Pt voiced understanding and was given a copy of goals to take home.   Ronald Wallace is off to a good restart in rehab.  He has been able to return to his workloads again.  We will continue to monitor his progress.   Ronald Wallace has been doing well in rehab.  He was able to mow 6 acres yesterday on his riding mower.  He has been walking for 88mn and stretching daily!!!     Expected Outcomes  -  -  Short: Use RPE daily to regulate intensity.  Long: Follow program prescription in THR.  Short: Increase workloads on Nustep and XR.  Long: Continue to exercise more independently.   Short: Continue to exercise daily.  Long: Continue to exercise independently.    [place] 05/28/17 1458             Exercise Goal Re-Evaluation   Exercise Goals Review  Increase Physical Activity;Understanding of  Exercise Prescription;Increase Strength and Stamina       Comments  Ronald Wallace has been doing in rehab.  He is 5 METs on the XR!  We will continue to monitor his progress.        Expected Outcomes  Short: Increase workload on XR.  Long: Continue to walk daily.           Discharge Exercise Prescription (Final Exercise Prescription Changes): Exercise Prescription Changes -  05/28/17 1400      Response to Exercise   Blood Pressure (Admit)  130/80    Blood Pressure (Exercise)  140/80    Blood Pressure (Exit)  124/64    Heart Rate (Admit)  67 bpm    Heart Rate (Exercise)  116 bpm    Heart Rate (Exit)  70 bpm    Rating of Perceived Exertion (Exercise)  12    Symptoms  none    Duration  Continue with 45 min of aerobic exercise without signs/symptoms of physical distress.    Intensity  THRR unchanged      Progression   Progression  Continue to progress workloads to maintain intensity without signs/symptoms of physical distress.    Average METs  3.54      Resistance Training   Training Prescription  Yes    Weight  3 lb    Reps  10-15      Interval Training   Interval Training  No      Treadmill   MPH  2.5    Grade  0    Minutes  15    METs  2.91      NuStep   Level  4    Minutes  15    METs  2.7      REL-XR   Level  2    Minutes  15    METs  5      Home Exercise Plan   Plans to continue exercise at  [employer] (comment) walking, treadmill, [employer]    Frequency  Add 2 additional days to program exercise sessions.    Initial Home Exercises Provided  01/31/17       Nutrition:  Target Goals: Understanding of nutrition guidelines, daily intake of sodium '1500mg'$ , cholesterol '200mg'$ , calories 30% from fat and 7% or less from saturated fats, daily to have 5 or more servings of fruits and vegetables.  Biometrics: Pre Biometrics - 05/03/17 1420      Pre Biometrics   Height  5' 7.75" (1.721 m)    Weight  197 lb 4.8 oz (89.5 kg)    Waist Circumference  44 inches    Hip Circumference  39 inches    Waist to Hip Ratio  1.13 %    BMI (Calculated)  30.22    Single Leg Stand  1.27 seconds      Post Biometrics - 05/30/17 0905       Post  Biometrics   Height  5' 7.75" (1.721 m)    Weight  197 lb 3.2 oz (89.4 kg)    Waist Circumference  39.5 inches    Hip Circumference  38 inches    Waist to Hip Ratio  1.04 %    BMI (Calculated)  30.2      Single Leg Stand  4.45 seconds       Nutrition Therapy Plan and Nutrition Goals: Nutrition Therapy & Goals - 05/03/17 1441      Intervention Plan   Intervention  Prescribe, educate and counsel regarding  individualized specific dietary modifications aiming towards targeted core components such as weight, hypertension, lipid management, diabetes, heart failure and other comorbidities.    Expected Outcomes  Short Term Goal: Understand basic principles of dietary content, such as calories, fat, sodium, cholesterol and nutrients.;Short Term Goal: A plan has been developed with personal nutrition goals set during dietitian appointment.;Long Term Goal: Adherence to prescribed nutrition plan.       Nutrition Assessments: Nutrition Assessments - 05/03/17 1441      MEDFICTS Scores   Pre Score  52       Nutrition Goals Re-Evaluation: Nutrition Goals Re-Evaluation    Row Name 01/31/17 0851 05/25/17 1421           Goals   Nutrition Goal  Meet with nutritionist on 1/30 during class  Meet with nutritionist during class       Comment  Ronald Wallace would like to meet with nutrtionist during class as lives is [employer].  He would like to just hear some tips for weight loss and how to cut back on his snacking.   Ronald Wallace went out before prior to meeting wiht nutritionist.  Appointment rescheduled for 5/22 during class.  He would like tips for weight loss.       Expected Outcome  Short: Meet with dietician  Long: Cut back on snacking.   Short: Meet with dietician  Long: Cut back on snacking.          Nutrition Goals Discharge (Final Nutrition Goals Re-Evaluation): Nutrition Goals Re-Evaluation - 05/25/17 1421      Goals   Nutrition Goal  Meet with nutritionist during class     Comment  Ronald Wallace went out before prior to meeting wiht nutritionist.  Appointment rescheduled for 5/22 during class.  He would like tips for weight loss.     Expected Outcome  Short: Meet with dietician  Long: Cut back on  snacking.        Psychosocial: Target Goals: Acknowledge presence or absence of significant depression and/or stress, maximize coping skills, provide positive support system. Participant is able to verbalize types and ability to use techniques and skills needed for reducing stress and depression.   Initial Review & Psychosocial Screening: Initial Psych Review & Screening - 05/03/17 1444      Initial Review   Current issues with  Current Stress Concerns    Source of Stress Concerns  Unable to perform yard/household activities;Family    Comments  Ronald Wallace has a married child and has stress about this family unit      [place]?  Yes      Barriers   Psychosocial barriers to participate in program  There are no identifiable barriers or psychosocial needs.;The patient should benefit from training in stress management and relaxation.      Screening Interventions   Interventions  Encouraged to exercise;Provide feedback about the scores to participant    Expected Outcomes  Short Term goal: Utilizing psychosocial counselor, staff and physician to assist with identification of specific Stressors or current issues interfering with healing process. Setting desired goal for each stressor or current issue identified.;Long Term Goal: Stressors or current issues are controlled or eliminated.;Short Term goal: Identification and review with participant of any Quality of Life or Depression concerns found by scoring the questionnaire.;Long Term goal: The participant improves quality of Life and PHQ9 Scores as seen by post scores and/or verbalization of changes       Quality of Life Scores:  Quality of Life - 05/03/17 1445      Quality of Life Scores   Health/Function Pre  18.37 %    Socioeconomic Pre  23.71 %    Psych/Spiritual Pre  24.43 %    Family Pre  22.8 %    GLOBAL Pre  18.37 %      Scores of 19 and below usually indicate a poorer quality of life in these areas.  A  difference of  2-3 points is a clinically meaningful difference.  A difference of 2-3 points in the total score of the Quality of Life Index has been associated with significant improvement in overall quality of life, self-image, physical symptoms, and general health in studies assessing change in quality of life.  PHQ-9: Recent Review Flowsheet Data    Depression screen [facility] 2/9 05/03/2017 01/24/2017 12/14/2016 12/14/2016   Decreased Interest 2 1 - 0   Down, Depressed, Hopeless 0 1 - 2   PHQ - 2 Score 2 2 - 2   Altered sleeping 0 0 - 3   Tired, decreased energy 2 1 - 2   Change in appetite 0 0 - 0   Feeling bad or failure about yourself  2 0 - 1   Trouble concentrating 0 0 - 3   Moving slowly or fidgety/restless 0 0 - 0   Suicidal thoughts 0 0 0  1   PHQ-9 Score 6 3 - 12   Difficult doing work/chores Somewhat difficult Not difficult at all - -     Interpretation of Total Score  Total Score Depression Severity:  1-4 = Minimal depression, 5-9 = Mild depression, 10-14 = Moderate depression, 15-19 = Moderately severe depression, 20-27 = Severe depression   Psychosocial Evaluation and Intervention: Psychosocial Evaluation - 05/07/17 0950      Psychosocial Evaluation & Interventions   Interventions  Stress management education;Encouraged to exercise with the program and follow exercise prescription    Comments  Ronald Wallace has returned to this program.  His Dr. encouraged he discontinue earlier due to shingles outbreak.  Ronald Wallace is a 76 year old who has a strong support system.  He continues to sleep well and has a good appetite.  Ronald Wallace admits to some depression or anxiety at times and his mood is "okay" most of the time.  He admits to stress and concern with his daughter not spending any time with her twins - leaving that responsibility primarily to [place] and his wife.  counselor encouraged him to set healthy limits and shared strategies on this.  Ronald Wallace has goals to lose some weight and decrease  some soreness in his body due to a fall he experienced 20+ years ago.  Staff will follow with him.     Expected Outcomes  Short - Ronald Wallace will meet with the dietician to address his weight loss goals.  He will also begin to set healthy limits with his daughter to alleviate his concerns.  Long - Ronald Wallace will exercise more consistenly for his heart health and to help manage his stress more positively.      Continue Psychosocial Services   Follow up required by staff       Psychosocial Re-Evaluation: Psychosocial Re-Evaluation    Row Name 01/31/17 0855 05/25/17 1415           Psychosocial Re-Evaluation   Current issues with  Current Stress Concerns  Current Stress Concerns      Comments  Ronald Wallace has been doing well in rehab.  He remains  postive overall.  He and his son are doing better and he's doing better too.  His son is out in [place] skiing, but called him on Sunday.  He continues to sleep well.  He has found that exercise helps him feel better and he is not as sore any more.   Ronald Wallace continues to do well in rehab since returning.  He is staying postive and doing his best to set up boundaries with his daughter. His strained relationship continues to bring him grief and he got teary eyed just talking about it briefly today.  He has found that class has been very helpful at learning what to do correctly to keep him moving forward.  He also enjoyed talking with Juliann Pulse.  He continues to sleep well and stay mostly postive.       Expected Outcomes  Short: Continue to attend rehab and reduce soreness.  Long: Continue to maintain positive attitude.   Short: Continue to attend rehab.  Long: Continue to cope with daughter.          Psychosocial Discharge (Final Psychosocial Re-Evaluation): Psychosocial Re-Evaluation - 05/25/17 1415      Psychosocial Re-Evaluation   Current issues with  Current Stress Concerns    Comments  Ronald Wallace continues to do well in rehab since returning.  He is staying postive and  doing his best to set up boundaries with his daughter. His strained relationship continues to bring him grief and he got teary eyed just talking about it briefly today.  He has found that class has been very helpful at learning what to do correctly to keep him moving forward.  He also enjoyed talking with Juliann Pulse.  He continues to sleep well and stay mostly postive.     Expected Outcomes  Short: Continue to attend rehab.  Long: Continue to cope with daughter.        Vocational Rehabilitation: Provide vocational rehab assistance to qualifying candidates.   Vocational Rehab Evaluation & Intervention: Vocational Rehab - 05/03/17 1452      Initial Vocational Rehab Evaluation & Intervention   Assessment shows need for Vocational Rehabilitation  No       Education: Education Goals: Education classes will be provided on a variety of topics geared toward better understanding of heart health and risk factor modification. Participant will state understanding/return demonstration of topics presented as noted by education test scores.  Learning Barriers/Preferences: Learning Barriers/Preferences - 05/03/17 1451      Learning Barriers/Preferences   Learning Barriers  None    Learning Preferences  None       Education Topics:  AED/CPR: - Group verbal and written instruction with the use of models to demonstrate the basic use of the AED with the basic ABC's of resuscitation.   General Nutrition Guidelines/Fats and Fiber: -Group instruction provided by verbal, written material, models and posters to present the general guidelines for heart healthy nutrition. Gives an explanation and review of dietary fats and fiber.   Cardiac Rehab from 06/04/2017 in [facility] Cardiac and Pulmonary Rehab  Date  06/04/17  Educator  CR  Instruction Review Code  1- Verbalizes Understanding      Controlling Sodium/Reading Food Labels: -Group verbal and written material supporting the discussion of sodium use in heart  healthy nutrition. Review and explanation with models, verbal and written materials for utilization of the food label.   Exercise Physiology & General Exercise Guidelines: - Group verbal and written instruction with models to review the exercise physiology of the cardiovascular  system and associated critical values. Provides general exercise guidelines with specific guidelines to those with heart or lung disease.    Aerobic Exercise & Resistance Training: - Gives group verbal and written instruction on the various components of exercise. Focuses on aerobic and resistive training programs and the benefits of this training and how to safely progress through these programs..   Flexibility, Balance, Mind/Body Relaxation: Provides group verbal/written instruction on the benefits of flexibility and balance training, including mind/body exercise modes such as yoga, pilates and tai chi.  Demonstration and skill practice provided.   Cardiac Rehab from 06/04/2017 in [facility] Cardiac and Pulmonary Rehab  Date  05/07/17  Educator  [facility]  Instruction Review Code  1- Verbalizes Understanding      Stress and Anxiety: - Provides group verbal and written instruction about the health risks of elevated stress and causes of high stress.  Discuss the correlation between heart/lung disease and anxiety and treatment options. Review healthy ways to manage with stress and anxiety.   Cardiac Rehab from 06/04/2017 in [facility] Cardiac and Pulmonary Rehab  Date  05/16/17  Educator  [facility]  Instruction Review Code  1- Verbalizes Understanding      Depression: - Provides group verbal and written instruction on the correlation between heart/lung disease and depressed mood, treatment options, and the stigmas associated with seeking treatment.   Anatomy & Physiology of the Heart: - Group verbal and written instruction and models provide basic cardiac anatomy and physiology, with the coronary electrical and arterial systems. Review  of Valvular disease and Heart Failure   Cardiac Rehab from 06/04/2017 in [facility] Cardiac and Pulmonary Rehab  Date  05/14/17  Educator  SB  Instruction Review Code  1- Verbalizes Understanding      Cardiac Procedures: - Group verbal and written instruction to review commonly prescribed medications for heart disease. Reviews the medication, class of the drug, and side effects. Includes the steps to properly store meds and maintain the prescription regimen. (beta blockers and nitrates)   Cardiac Rehab from 06/04/2017 in [facility] Cardiac and Pulmonary Rehab  Date  05/28/17  Educator  SB  Instruction Review Code  1- Verbalizes Understanding      Cardiac Medications I: - Group verbal and written instruction to review commonly prescribed medications for heart disease. Reviews the medication, class of the drug, and side effects. Includes the steps to properly store meds and maintain the prescription regimen.   Cardiac Rehab from 06/04/2017 in [facility] Cardiac and Pulmonary Rehab  Date  05/21/17  Educator  SB  Instruction Review Code  1- Verbalizes Understanding      Cardiac Medications II: -Group verbal and written instruction to review commonly prescribed medications for heart disease. Reviews the medication, class of the drug, and side effects. (all other drug classes)   Cardiac Rehab from 06/04/2017 in [facility] Cardiac and Pulmonary Rehab  Date  05/09/17  Educator  [facility]  Instruction Review Code  1- Verbalizes Understanding       Go Sex-Intimacy & Heart Disease, Get SMART - Goal Setting: - Group verbal and written instruction through game format to discuss heart disease and the return to sexual intimacy. Provides group verbal and written material to discuss and apply goal setting through the application of the S.M.A.R.T. Method.   Cardiac Rehab from 06/04/2017 in [facility] Cardiac and Pulmonary Rehab  Date  05/28/17  Educator  SB  Instruction Review Code  1- Verbalizes Understanding      Other Matters  of the Heart: -  Provides group verbal, written materials and models to describe Stable Angina and Peripheral Artery. Includes description of the disease process and treatment options available to the cardiac patient.   Cardiac Rehab from 06/04/2017 in [facility] Cardiac and Pulmonary Rehab  Date  05/14/17  Educator  SB  Instruction Review Code  1- Verbalizes Understanding      Exercise & Equipment Safety: - Individual verbal instruction and demonstration of equipment use and safety with use of the equipment.   Cardiac Rehab from 06/04/2017 in [facility] Cardiac and Pulmonary Rehab  Date  01/22/17  Educator  [facility]  Instruction Review Code  1- Verbalizes Understanding      Infection Prevention: - Provides verbal and written material to individual with discussion of infection control including proper hand washing and proper equipment cleaning during exercise session.   Cardiac Rehab from 06/04/2017 in [facility] Cardiac and Pulmonary Rehab  Date  01/22/17  Educator  [facility]  Instruction Review Code  1- Verbalizes Understanding      Falls Prevention: - Provides verbal and written material to individual with discussion of falls prevention and safety.   Diabetes: - Individual verbal and written instruction to review signs/symptoms of diabetes, desired ranges of glucose level fasting, after meals and with exercise. Acknowledge that pre and post exercise glucose checks will be done for 3 sessions at entry of program.   Know Your Numbers and Risk Factors: -Group verbal and written instruction about important numbers in your health.  Discussion of what are risk factors and how they play a role in the disease process.  Review of Cholesterol, Blood Pressure, Diabetes, and BMI and the role they play in your overall health.   Cardiac Rehab from 06/04/2017 in [facility] Cardiac and Pulmonary Rehab  Date  05/09/17  Educator  [facility]  Instruction Review Code  1- Verbalizes Understanding      Sleep Hygiene: -Provides group verbal  and written instruction about how sleep can affect your health.  Define sleep hygiene, discuss sleep cycles and impact of sleep habits. Review good sleep hygiene tips.    Cardiac Rehab from 06/04/2017 in [facility] Cardiac and Pulmonary Rehab  Date  05/30/17  Educator  [facility]  Instruction Review Code  1- Verbalizes Understanding      Other: -Provides group and verbal instruction on various topics (see comments)   Cardiac Rehab from 06/04/2017 in [facility] Cardiac and Pulmonary Rehab  Date  01/24/17  Educator  [facility]  Instruction Review Code  1- Verbalizes Understanding [Risk Factors and Know Your Numbers]      Knowledge Questionnaire Score: Knowledge Questionnaire Score - 05/03/17 1452      Knowledge Questionnaire Score   Pre Score  21/28 REviewed correct responses with Ronald Wallace today. He verbalized understanding and had no further questions       Core Components/Risk Factors/Patient Goals at Admission: Personal Goals and Risk Factors at Admission - 05/03/17 1441      Core Components/Risk Factors/Patient Goals on Admission   Intervention  Weight Management: Develop a combined nutrition and exercise program designed to reach desired caloric intake, while maintaining appropriate intake of nutrient and fiber, sodium and fats, and appropriate energy expenditure required for the weight goal.;Weight Management/Obesity: Establish reasonable short term and long term weight goals.;Obesity: Provide education and appropriate resources to help participant work on and attain dietary goals.    Admit Weight  197 lb 4.8 oz (89.5 kg)    Goal Weight: Short Term  195 lb (88.5 kg)    Goal Weight: Long  Term  175 lb (79.4 kg)    Expected Outcomes  Short Term: Continue to assess and modify interventions until short term weight is achieved;Weight Loss: Understanding of general recommendations for a balanced deficit meal plan, which promotes 1-2 lb weight loss per week and includes a negative energy balance of [phone]  kcal/d;Understanding of distribution of calorie intake throughout the day with the consumption of 4-5 meals/snacks;Understanding recommendations for meals to include 15-35% energy as protein, 25-35% energy from fat, 35-60% energy from carbohydrates, less than '200mg'$  of dietary cholesterol, 20-35 gm of total fiber daily    Improve shortness of breath with ADL's  Yes    Intervention  Provide education, individualized exercise plan and daily activity instruction to help decrease symptoms of SOB with activities of daily living.    Expected Outcomes  Short Term: Improve cardiorespiratory fitness to achieve a reduction of symptoms when performing ADLs;Long Term: Be able to perform more ADLs without symptoms or delay the onset of symptoms    Diabetes  Yes    Intervention  Provide education about signs/symptoms and action to take for hypo/hyperglycemia.;Provide education about proper nutrition, including hydration, and aerobic/resistive exercise prescription along with prescribed medications to achieve blood glucose in normal ranges: Fasting glucose 65-99 mg/dL    Expected Outcomes  Short Term: Participant verbalizes understanding of the signs/symptoms and immediate care of hyper/hypoglycemia, proper foot care and importance of medication, aerobic/resistive exercise and nutrition plan for blood glucose control.;Long Term: Attainment of HbA1C < 7%.    Hypertension  Yes    Intervention  Provide education on lifestyle modifcations including regular physical activity/exercise, weight management, moderate sodium restriction and increased consumption of fresh fruit, vegetables, and low fat dairy, alcohol moderation, and smoking cessation.;Monitor prescription use compliance.    Expected Outcomes  Short Term: Continued assessment and intervention until BP is < 140/17m HG in hypertensive participants. < 130/857mHG in hypertensive participants with diabetes, heart failure or chronic kidney disease.;Long Term: Maintenance  of blood pressure at goal levels.    Lipids  Yes    Intervention  Provide education and support for participant on nutrition & aerobic/resistive exercise along with prescribed medications to achieve LDL '70mg'$ , HDL >'40mg'$ .    Expected Outcomes  Short Term: Participant states understanding of desired cholesterol values and is compliant with medications prescribed. Participant is following exercise prescription and nutrition guidelines.;Long Term: Cholesterol controlled with medications as prescribed, with individualized exercise RX and with personalized nutrition plan. Value goals: LDL < '70mg'$ , HDL > 40 mg.    Stress  Yes Family stress is a concern for [employer]    Intervention  Offer individual and/or small group education and counseling on adjustment to heart disease, stress management and health-related lifestyle change. Teach and support self-help strategies.;Refer participants experiencing significant psychosocial distress to appropriate mental health specialists for further evaluation and treatment. When possible, include family members and significant others in education/counseling sessions.    Expected Outcomes  Short Term: Participant demonstrates changes in health-related behavior, relaxation and other stress management skills, ability to obtain effective social support, and compliance with psychotropic medications if prescribed.;Long Term: Emotional wellbeing is indicated by absence of clinically significant psychosocial distress or social isolation.       Core Components/Risk Factors/Patient Goals Review:  Goals and Risk Factor Review    Row Name 01/31/17 0847 05/25/17 1422           Core Components/Risk Factors/Patient Goals Review   Personal Goals Review  Weight Management/Obesity;Hypertension;Diabetes;Lipids;Heart Failure  Weight Management/Obesity;Hypertension;Diabetes;Lipids;Heart  Failure      Review  Ronald Wallace has been doing well in rehab.  He noticed that his weight was up a little  today and admitted to eating more since his  wife was out.  He weighs daily to monitor his heart failure and has not had any symptoms.  He also monitors his salt intake.  His blood sugars have been pretty good on average. He has been watching what he eats.  He has noticed that his sugars are better when he exercises.  His wife is also diabetic and they work together to monitor their sugars.  His blood pressures have been pretty good, he checks them about  every other day.  He feels that his medications are working for him.   Ronald Wallace continues to do well in rehab.  His weight is starting to come back down again.  He was down to 196 lbs today.  His blood pressures have continued to be good and his wife checks and records them daily.  They also monitor his blood sugars regularly.  His sugars have been running high 220-300 at home.  They did change his insulin on Tuesday for better control.       Expected Outcomes  Short: Continue to work on weight loss.  Long: Contiue to manage diabetes.   Short: Continue to work on better blood sugar control.   Long: Continue to work on risk factor modifications.          Core Components/Risk Factors/Patient Goals at Discharge (Final Review):  Goals and Risk Factor Review - 05/25/17 1422      Core Components/Risk Factors/Patient Goals Review   Personal Goals Review  Weight Management/Obesity;Hypertension;Diabetes;Lipids;Heart Failure    Review  Ronald Wallace continues to do well in rehab.  His weight is starting to come back down again.  He was down to 196 lbs today.  His blood pressures have continued to be good and his wife checks and records them daily.  They also monitor his blood sugars regularly.  His sugars have been running high 220-300 at home.  They did change his insulin on Tuesday for better control.     Expected Outcomes  Short: Continue to work on better blood sugar control.   Long: Continue to work on risk factor modifications.        ITP Comments: ITP Comments     Row Name 12/14/16 1321 12/14/16 1324 12/20/16 0545 01/10/17 1510 01/17/17 0630   ITP Comments  ITP created during Medical Review/Orientation appt after Cardiac REhab informed consent was signed.   Stable angina note documented in Dr. Donivan Scull 11/27/2016 note  30 day review. Continue with ITP unless directed changes per Medical Director review.   After some dermatology surgery, Ronald Wallace's doctor requested that he stay out until after the new year.   30 day review. Continue with ITP unless directed changes per Medical Director review.   1 visit since last review   Row Name 02/08/17 1123 02/14/17 0557 02/20/17 1519 03/05/17 1551 03/14/17 0642   ITP Comments  Mrs. Zurn called to let us know that Ronald Wallace has been out with shingles in his ear.  We are going to give a couple weeks to see if he gets any better.   30 Day review. Continue with ITP unless directed changes per Medical Director review.   Called to check on Ronald Wallace with his Shingles. He is still taking his medications and has some open wounds.  Talked about making sure he finishes his medications and  all wounds are sealed before he is able to return to rehab.   Called to check on status of return.  He continues to have pain from Shingles.  The rash has improved per office notes but he says the pain is still in his back.  We will continue to touch base with him about his progress.  30 day review completed. Continue with ITP unless diercted changes per Medical Director. Remains out this month with medical reason   Row Name 03/21/17 1439 04/02/17 1500 05/03/17 1432 05/09/17 0607 06/06/17 0541   ITP Comments  Called to check on pt status to return to rehab.  Spoke with his wife.  His wounds have all dried up and scabbed over.  He hopes to return on Monday.   Ronald Wallace called this morning with questions about whether or not he should return.  He has finally recovered from his shingles, but now is experiencing a lot of pain.  We are going to discharge him at this  time as he has been out for almost two months and have him return under a new order and new walk test to finish the program.   Ronald Wallace returned to day to resume his cardiac rehab after discharging for lengthy time out because of medicl reason. ITP created and will be sent to Dr Loleta Chance for review, changes as needed and signature. Documentation of diagnosis can be found in [facility] encounter11/02/2016  30 day review. Continue with ITP unless directed changes per Medical Director  30 day review. Continue with ITP unless directed changes per Medical Director      Comments:

## 2017-07-13 ENCOUNTER — Other Ambulatory Visit: Payer: Self-pay

## 2017-07-13 ENCOUNTER — Telehealth: Payer: Self-pay | Admitting: Cardiovascular Disease

## 2017-07-13 MED ORDER — ISOSORBIDE MONONITRATE ER 30 MG PO TB24: 30.0000 mg | ORAL_TABLET | Freq: Every day | ORAL | 0 refills | Status: DC

## 2017-07-13 NOTE — Telephone Encounter (Signed)
Refill sent.  isosorbide mononitrate (IMDUR) 30 MG 24 hr tablet 90 tablet 0 07/13/2017    Sig - Route: Take 1 tablet (30 mg total) by mouth daily. - Oral   Sent to pharmacy as: isosorbide mononitrate (IMDUR) 30 MG 24 hr tablet   E-Prescribing Status: Sent to pharmacy (07/13/2017 11:23 AM EDT)   Pharmacy   [facility] [place] - [place]

## 2017-07-13 NOTE — Telephone Encounter (Signed)
°*  STAT* If patient is at the pharmacy, call can be transferred to refill team.   1. Which medications need to be refilled? (please list name of each medication and dose if known) Isosorbide   2. Which pharmacy/location (including street and city if local pharmacy) is medication to be sent to? [facility] on [address]  3. Do they need a 30 day or 90 day supply? 90 day

## 2017-08-05 NOTE — Progress Notes (Signed)
Cardiology Office Note  Date:  08/08/2017   ID:  Ronald [place], DOB 02-04-1940, MRN [id number]  PCP:  [name], MD   Chief Complaint  Patient presents with  . other    6 month follow up. Meds reviewed by the pt.'s med list. Pt. c/o shortness of breath, gives out, left leg numbness, cold feet and chest pain.     HPI:  77 y/o Male with a prior h/o  syncope,  HTN,  HL,   DM, remote smoking, pipe, , no cigarettes  increasing dyspnea and underwent stress testing @ [facility], which was abnl,   cath in late June 2017 revealing moderate LAD and LCX/OM dzs with a total occlusion of the prox RCA,  L R collaterals.  LV fxn was nl,  who presents for routine follow-up of his coronary artery disease,  chronic shortness of breath  Main complaint today is he is very short of breath with minimal exertion Sedentary lifestyle Felt better after he completed cardiac rehabilitation, has not done anything since then Denies any significant lower extremity edema or abdominal bloating Wife reports that he sits around all day Weight is up 6-8 pounds over the past year  Denies any significant chest discomfort No episodes of confusion. Previously reported episode of confusion while driving Vision has stayed stable   Lab work reviewed with him toal chol 106, LDL 54 HBA1C 8.1 Tolerating statin  EKG personally reviewed by myself on todays visit showsNormal sinus rhythm with rate 60 bpm no significant ST or T-wave changes  other past medical history reviewed  catheterization  June 2017 discussed with him, 30% LAD disease, occluded RCA with collaterals  02/2014 Carotid Dopplers: 1-39% bilateral ICA with normal Subclavian & vertebral arteries; b. 02/2014 2 D Echo: EF 50-55%, Gr 1 DD, Mild MR & LA dilation -- was bradycardic in 40s 02/2014 Neg Ex MV, EF 65%; b. 06/2015 Abnl MV ([facility])-->Cath ([facility]): LM nl, LAD 30p/m, LCX 30p, OM2 50p, RCA 100p w/ L-->R collats, nl EF-->Med  Rx.   PMH:   has a past medical history of Anemia, CAD (coronary artery disease), Diabetes mellitus, type II, insulin dependent ([place]), Diverticulitis, Essential hypertension, Hyperlipidemia, Neuropathy, Shortness of breath dyspnea, and Syncope and collapse.  PSH:    Past Surgical History:  Procedure Laterality Date  . CARDIAC CATHETERIZATION Left 07/07/2015   Procedure: Left Heart Cath and Coronary Angiography;  Surgeon: Isaias Cowman, MD;  Location: [facility];  Service: Cardiovascular;  Laterality: Left;  . CARPAL TUNNEL RELEASE    . CATARACT EXTRACTION Right   . CATARACT EXTRACTION W/PHACO Left 10/06/2015   Procedure: CATARACT EXTRACTION PHACO AND INTRAOCULAR LENS PLACEMENT (IOC);  Surgeon: Estill Cotta, MD;  Location: [facility] ORS;  Service: Ophthalmology;  Laterality: Left;  [place] 01:42 AP% 25.1 CDE 45.92 Fluid pack lot # [id number]  . NM MYOVIEW LTD  03/13/2014   LOW RISK - positive EKG but negative imaging: 2 mm ST Depression in V4 & V5 but No scintigraphic evidence of Ischemia or Infarct o  . TRANSTHORACIC ECHOCARDIOGRAM  03/12/2014   EF 50-55%, Gr 1 DD, no RWMA, Mlid MR & LA dil, Normla RV and bradycardic 40s -    Current Outpatient Medications  Medication Sig Dispense Refill  . aspirin 81 MG tablet Take 81 mg by mouth daily.    . calcium carbonate (OS-CAL) 600 MG TABS tablet Take 600 mg by mouth daily with breakfast.    . cholecalciferol (VITAMIN D) 1000 UNITS tablet Take 1,000  Units by mouth daily.    . Ferrous Sulfate (IRON) 325 (65 FE) MG TABS Take 1 tablet by mouth daily.     Marland Kitchen gabapentin (NEURONTIN) 300 MG capsule Take 600 mg by mouth at bedtime.    . isosorbide mononitrate (IMDUR) 30 MG 24 hr tablet Take 1 tablet (30 mg total) by mouth daily. 90 tablet 0  . LANTUS 100 UNIT/ML injection Inject 45 Units into the skin daily. Take at bedtime    . losartan (COZAAR) 50 MG tablet Take 50 mg by mouth daily.     . metFORMIN (GLUCOPHAGE) 500 MG tablet Take 250 mg by  mouth 2 (two) times daily with a meal.    . NOVOLOG 100 UNIT/ML injection Inject into the skin 3 (three) times daily with meals. 20 units am, 26 units lunch, 26 units supper    . PARoxetine (PAXIL) 30 MG tablet Take 1 tablet by mouth nightly    . simvastatin (ZOCOR) 20 MG tablet Take 20 mg by mouth daily.    . tamsulosin (FLOMAX) 0.4 MG CAPS capsule Take 0.4 mg by mouth daily.     No current facility-administered medications for this visit.      Allergies:   Amlodipine   Social History:  The patient  reports that he has never smoked. He has never used smokeless tobacco. He reports that he does not drink alcohol or use drugs.   Family History:   family history includes Heart attack in his mother.    Review of Systems: Review of Systems  Constitutional: Negative.   Respiratory: Positive for shortness of breath.   Cardiovascular: Negative.   Gastrointestinal: Negative.   Musculoskeletal: Negative.   Neurological: Negative.   Psychiatric/Behavioral: Negative.   All other systems reviewed and are negative.   PHYSICAL EXAM: VS:  BP 110/60 (BP Location: Left Arm, Patient Position: Sitting, Cuff Size: Normal)   Pulse 60   Ht 5\' 8"  (1.727 m)   Wt 203 lb (92.1 kg)   BMI 30.87 kg/m  , BMI Body mass index is 30.87 kg/m.  Constitutional:  oriented to person, place, and time. No distress.  HENT:  Head: Normocephalic and atraumatic.  Eyes:  no discharge. No scleral icterus.  Neck: Normal range of motion. Neck supple. No JVD present.  Cardiovascular: Normal rate, regular rhythm, normal heart sounds and intact distal pulses. Exam reveals no gallop and no friction rub. No edema 2/6 systolic ejection murmur left sternal border Pulmonary/Chest: Effort normal and breath sounds normal. No stridor. No respiratory distress.  no wheezes.  no rales.  no tenderness.  Abdominal: Soft.  no distension.  no tenderness.  Musculoskeletal: Normal range of motion.  no  tenderness or deformity.   Neurological:  normal muscle tone. Coordination normal. No atrophy Skin: Skin is warm and dry. No rash noted. not diaphoretic.  Psychiatric:  normal mood and affect. behavior is normal. Thought content normal.    Recent Labs: No results found for requested labs within last 8760 hours.    Lipid Panel No results found for: CHOL, HDL, LDLCALC, TRIG    Wt Readings from Last 3 Encounters:  08/08/17 203 lb (92.1 kg)  05/30/17 197 lb 3.2 oz (89.4 kg)  05/03/17 197 lb 4.8 oz (89.5 kg)      ASSESSMENT AND PLAN:  Essential hypertension - Plan: EKG 12-Lead Blood pressure is well controlled on today's visit. No changes made to the medications. Stable  Mixed hyperlipidemia - Plan: EKG 12-Lead Cholesterol is at goal on the  current lipid regimen. No changes to the medications were made. Discussed new guidelines pushing for more aggressive LDL.  Will continue simvastatin at current dose for now   Coronary artery disease of native artery of native heart with stable angina pectoris ([place]) - Plan: EKG 12-Lead Occluded RCA with collaterals from left to right by catheterization June 2017 Persistent shortness of breath, stable anginal symptoms, Very deconditioned If symptoms get worse we will order stress testing Recommended weight loss  Diabetes mellitus, type II, insulin dependent ([place]) - Plan: EKG 12-Lead We have encouraged continued exercise, careful diet management in an effort to lose weight. Very sedentary at baseline  DOE (dyspnea on exertion) -  Long discussion with him concerning his shortness of breath symptoms Although unable to exclude ischemia,  High suspicion for debilitated state given his sedentary lifestyle and weight gain Was previously feeling well after cardiac rehabilitation and since then is not done anything, sits around all day Recommended a regular walking program, joining the [facility] If symptoms get worse we would order stress testing to rule out ischemia  Chronic  renal impairment, stage 3 (moderate) - Plan: EKG 12-Lead Lasix as needed for leg swelling   Total encounter time more than 45 minutes  Greater than 50% was spent in counseling and coordination of care with the patient  Disposition:   F/U  12 months   Orders Placed This Encounter  Procedures  . EKG 12-Lead     Signed, Esmond Plants, M.D., Ph.D. 08/08/2017  [place], [place]

## 2017-08-08 ENCOUNTER — Encounter: Payer: Self-pay | Admitting: Cardiovascular Disease

## 2017-08-08 ENCOUNTER — Ambulatory Visit: Payer: PPO | Admitting: Cardiovascular Disease

## 2017-08-08 VITALS — BP 110/60 | HR 60 | Ht 68.0 in | Wt 203.0 lb

## 2017-08-08 DIAGNOSIS — E782 Mixed hyperlipidemia: Secondary | ICD-10-CM | POA: Diagnosis not present

## 2017-08-08 DIAGNOSIS — Z794 Long term (current) use of insulin: Secondary | ICD-10-CM

## 2017-08-08 DIAGNOSIS — N183 Chronic kidney disease, stage 3 unspecified: Secondary | ICD-10-CM

## 2017-08-08 DIAGNOSIS — I10 Essential (primary) hypertension: Secondary | ICD-10-CM | POA: Diagnosis not present

## 2017-08-08 DIAGNOSIS — I25118 Atherosclerotic heart disease of native coronary artery with other forms of angina pectoris: Secondary | ICD-10-CM | POA: Diagnosis not present

## 2017-08-08 DIAGNOSIS — R55 Syncope and collapse: Secondary | ICD-10-CM

## 2017-08-08 DIAGNOSIS — R0600 Dyspnea, unspecified: Secondary | ICD-10-CM

## 2017-08-08 DIAGNOSIS — E119 Type 2 diabetes mellitus without complications: Secondary | ICD-10-CM

## 2017-08-08 DIAGNOSIS — R0609 Other forms of dyspnea: Secondary | ICD-10-CM | POA: Diagnosis not present

## 2017-08-08 NOTE — Patient Instructions (Addendum)
Medication Instructions:   No medication changes made  Labwork:  No new labs needed  Testing/Procedures:  Call if you would like : ABI for leg pain or cold feet  Call if you would like a stress test   Follow-Up: It was a pleasure seeing you in the office today. Please call us if you have new issues that need to be addressed before your next appt.  [phone]  Your physician wants you to follow-up in: 12 months.  You will receive a reminder letter in the mail two months in advance. If you don't receive a letter, please call our office to schedule the follow-up appointment.  If you need a refill on your cardiac medications before your next appointment, please call your pharmacy.  For educational health videos Log in to : [email or web address] Or : [email or web address], password : [email or web address]

## 2017-08-30 DIAGNOSIS — E1159 Type 2 diabetes mellitus with other circulatory complications: Secondary | ICD-10-CM | POA: Diagnosis not present

## 2017-08-30 DIAGNOSIS — E785 Hyperlipidemia, unspecified: Secondary | ICD-10-CM | POA: Diagnosis not present

## 2017-08-30 DIAGNOSIS — E1142 Type 2 diabetes mellitus with diabetic polyneuropathy: Secondary | ICD-10-CM | POA: Diagnosis not present

## 2017-08-30 DIAGNOSIS — E1169 Type 2 diabetes mellitus with other specified complication: Secondary | ICD-10-CM | POA: Diagnosis not present

## 2017-08-30 DIAGNOSIS — I10 Essential (primary) hypertension: Secondary | ICD-10-CM | POA: Diagnosis not present

## 2017-08-30 DIAGNOSIS — Z794 Long term (current) use of insulin: Secondary | ICD-10-CM | POA: Diagnosis not present

## 2017-09-13 ENCOUNTER — Other Ambulatory Visit: Payer: Self-pay

## 2017-09-13 NOTE — Patient Outreach (Signed)
[place] [facility]) Care Management  09/13/2017  Ronald Wallace [birth year] [id number]   Telephone Screen  Referral Date: 09/13/17 Referral Source: HTA Concierge Referral Reason: "requesting assistance for insulin Rx costs while in coverage gap" Insurance: HTA   Outreach attempt # 1 to patient. No answer at present. RN CM left HIPAA compliant voicemail message along with contact info.       Plan: RN CM will make outreach attempt to patient within 3-4 business days. RN CM will send unsuccessful outreach letter to patient.    Enzo Montgomery, RN,BSN,CCM [place] Management Telephonic Care Management Coordinator Direct Phone: [phone] Toll Free: [phone] Fax: [phone]

## 2017-09-14 ENCOUNTER — Other Ambulatory Visit: Payer: Self-pay

## 2017-09-14 NOTE — Patient Outreach (Signed)
[facility]) Care Management  09/14/2017  Ronald Wallace 1940-03-03 [id number]   Telephone Screen  Referral Date: 09/13/17 Referral Source: HTA Concierge Referral Reason: "requesting assistance for insulin Rx costs while in coverage gap" Insurance: HTA   Outreach attempt #2 to patient. Spoke with spouse. Screening completed.  Social: Patient resides in her home along with her spouse. She voices that she is independent with ADLS/IADLs. Patient denies any recent falls. She states she does not use any assistive devices. She drives herself to appts.  Conditions: Per chart review, patient has PMH stable angina, HTN, HLD and DM. Spouse voices that patient is managing his blood sugars. She report that cbgs are normal. Spouse denies needing any further education or support in managing chronic conditions. Spouse voices that patient's A1C has come don to 7.3 from previously been in the 8's.  Medications: Per spouse patient taking about 15 meds. She states she assists patient with managing meds. Spouse voices that patient hit the doughnut hole in June. Since then he has been having trouble paying for his insulin-Novolog. She voices that Novolog is costing him about $250.00.  Appointments: Patient followed by and sees PCP regularly.  Advance Directives: She voices that they both are in the process of completing paperwork at attorney office.   Consent: [facility] services reviewed and discussed with spouse. She gave verbal consent for services.      Plan: RN CM will send [place] referral for possible med assistance and polypharmacy med review.    Enzo Montgomery, RN,BSN,CCM [place] Management Telephonic Care Management Coordinator Direct Phone: [phone] Toll Free: [phone] Fax: [phone]

## 2017-09-18 DIAGNOSIS — Z08 Encounter for follow-up examination after completed treatment for malignant neoplasm: Secondary | ICD-10-CM | POA: Diagnosis not present

## 2017-09-18 DIAGNOSIS — L570 Actinic keratosis: Secondary | ICD-10-CM | POA: Diagnosis not present

## 2017-09-18 DIAGNOSIS — X32XXXA Exposure to sunlight, initial encounter: Secondary | ICD-10-CM | POA: Diagnosis not present

## 2017-09-18 DIAGNOSIS — E11319 Type 2 diabetes mellitus with unspecified diabetic retinopathy without macular edema: Secondary | ICD-10-CM | POA: Diagnosis not present

## 2017-09-18 DIAGNOSIS — D485 Neoplasm of uncertain behavior of skin: Secondary | ICD-10-CM | POA: Diagnosis not present

## 2017-09-18 DIAGNOSIS — C44619 Basal cell carcinoma of skin of left upper limb, including shoulder: Secondary | ICD-10-CM | POA: Diagnosis not present

## 2017-09-18 DIAGNOSIS — Z85828 Personal history of other malignant neoplasm of skin: Secondary | ICD-10-CM | POA: Diagnosis not present

## 2017-09-19 ENCOUNTER — Ambulatory Visit: Payer: Self-pay

## 2017-09-21 ENCOUNTER — Other Ambulatory Visit: Payer: Self-pay | Admitting: Pharmacist

## 2017-09-21 NOTE — Patient Outreach (Signed)
[place] [facility]) Care Management  09/21/2017  Ronald Wallace 09-03-1940 [id number]   77 year old male referred to [place] Management by HTA Concierge for medication assistance.   PMHx includes, but not limited to, diabetes mellitus type 2, HTN, HLD, CAD, chronic renal insufficiency, neuropathy, and anemia.   Unsuccessful call today to [name].  Home phone rang and was busy.  I left a HIPAA compliant voicemail on the mobile phone.   Plan: I will send patient an outreach letter describing [facility] services and follow-up with him again next week.   Ralene Bathe, PharmD, [place] [phone]

## 2017-09-26 ENCOUNTER — Other Ambulatory Visit: Payer: Self-pay | Admitting: Pharmacist

## 2017-09-26 NOTE — Patient Outreach (Addendum)
[facility]) Care Management  09/26/2017  Ronald Wallace 1940-11-15 [id number]   77 year old male referred to [place] Management by HTA Concierge for medication assistance.   PMHx includes, but not limited to, diabetes mellitus type 2, HTN, HLD, CAD, chronic renal insufficiency, neuropathy, and anemia.   Unsuccessful call attempt #2 today to [name].  I left a HIPAA compliant voicemail on the home phone.  Mobile phone did not have a voicemail box set up.     Plan: I will make 3rd call attempt to patient within 3-4 business days unless I hear back beforehand.   Ralene Bathe, PharmD, [place] [phone]   ADDENDUM: Incoming call received from patient's spouse, Ronald Wallace, regarding medication assistance.  HIPAA identifiers verified.  Spouse reports patient is in the coverage gap and having difficulty paying for insulin, including both Lantus and Novolog.  She is not able to verify patient's medication during our telephone conversation as she is not at home.    Medication Assistance: Per HTA records, patient has spent $1,686.02 since 09/21/2017.  Extra Help: Not eligible based on reported income.    PAPs: 1) Novolog- eligible based on income and TROOP.  Will proceed with application.  Reviewed application requirements with spouse.   2) Lantus - patient meets income requirements but spouse unclear exact annual gross income to determine 5% TROOP.  Spouse requests that application be sent as well and she will look for financial documents to confirm income.  She is aware to include a copy with applications.   Plan: I will route patient assistance letter to pharmacy technician, Etter Sjogren, who will coordinate application process for Lantus and Novolog.  She will assist with obtaining all pertinent documents from both patient and Dr. Mee Hives and submit application once completed.    Ralene Bathe,  PharmD, [place] [phone]

## 2017-09-27 ENCOUNTER — Other Ambulatory Visit: Payer: Self-pay | Admitting: Pharmacy Technician

## 2017-09-27 NOTE — Patient Outreach (Signed)
[place] [facility]) Care Management  09/27/2017  CALIBER LANDESS Apr 12, 1940 [id number]   Received Novo Nordisk (Novolog) and Sanofi (Lantus) patient assistance referral from [employer]. Prepared patient portion of applications to be mailed and faxed provider portions to Dr. Honor Junes.  Will follow up with patient in 7-10 business days to confirm application have been received.  [name] [place], [place] Management [phone]

## 2017-10-02 ENCOUNTER — Ambulatory Visit: Payer: Self-pay | Admitting: Pharmacist

## 2017-10-07 ENCOUNTER — Other Ambulatory Visit: Payer: Self-pay | Admitting: Cardiovascular Disease

## 2017-10-10 ENCOUNTER — Other Ambulatory Visit: Payer: Self-pay | Admitting: Pharmacy Technician

## 2017-10-10 ENCOUNTER — Other Ambulatory Visit: Payer: Self-pay | Admitting: Pharmacist

## 2017-10-10 NOTE — Patient Outreach (Signed)
[place] [facility]) Care Management  10/10/2017  MATTIA LIFORD 08/03/1940 [id number]  77 y.o. year old male referred to [place] for medication assistance.   Successful call to patient's spouse today to follow-up on applications.  Per spouse, applications received, completed, and returned to [facility] last week.  She would like a return call once applications received from [facility].    Plan: I will update [facility] pharmacy technician who will contact patient regarding updates on application.   Ralene Bathe, PharmD, [place] [phone]

## 2017-10-10 NOTE — Patient Outreach (Addendum)
[place] [facility]) Care Management  10/10/2017  Ronald Wallace February 05, 1940 [id number]   Successful call attempt to [name], HIPAA identifiers verified. I asked [name] if the application for [name] had been received and she wasn't "quite sure". I also informed her that I received her patient assistance application for Novolog however, the last page of application was missing that has a place she needs to sign. I also informed her that I would still need proof of [name]' income as well. [name] stated that she was going to go through paper work and mail that she has received this week and will call me back to let me know if they had received Mr. Layton' applications and if she come across the last page of hers.  Will call patient back in 2-3 days if she hasn't called me back.  [name] [place], [place] Management [phone]    ADDENDUM 12:15pm  Incoming call from [name]. [name] thinks that her husbands applications have been misplaced. Prepared new signature portions of [name] and [employer] as well as included 3rd page of [name] application that [name] needs to sign to be mailed.  Will follow up with patient in 5-7 business days to confirm applications have been received.  [name] [place], [place] Management [phone]

## 2017-10-22 ENCOUNTER — Other Ambulatory Visit: Payer: Self-pay | Admitting: Pharmacist

## 2017-10-22 NOTE — Patient Outreach (Signed)
[place] [facility]) Care Management  10/22/2017  MARQUI FORMBY 10-12-1940 [id number]  Reason for consult:  Medication assistance  Successful telephone call to Mr. Bommarito spouse.  She reports application and financial documents received from [facility] and returned to post office on 10/19/2017.    Plan: Follow-up that applications received by [facility] and submitted to patient assistance companies.    Ralene Bathe, PharmD, [place] [phone]

## 2017-10-23 ENCOUNTER — Other Ambulatory Visit: Payer: Self-pay | Admitting: Pharmacy Technician

## 2017-10-23 NOTE — Patient Outreach (Signed)
[place] [facility]) Care Management  10/23/2017  Ronald Wallace 02-Apr-1940 [id number]   Received patient portion of applications for Novolog and Lantus. Faxed completed applications and required documents into [employer] (Lantus) and [name]).  Will follow up with companies in 2-3 business days to check status of applications.  [name] [place], [place] Management [phone]

## 2017-10-25 DIAGNOSIS — I10 Essential (primary) hypertension: Secondary | ICD-10-CM | POA: Diagnosis not present

## 2017-10-25 DIAGNOSIS — D638 Anemia in other chronic diseases classified elsewhere: Secondary | ICD-10-CM | POA: Diagnosis not present

## 2017-10-25 DIAGNOSIS — E1159 Type 2 diabetes mellitus with other circulatory complications: Secondary | ICD-10-CM | POA: Diagnosis not present

## 2017-10-26 ENCOUNTER — Other Ambulatory Visit: Payer: Self-pay | Admitting: Pharmacy Technician

## 2017-10-26 NOTE — Patient Outreach (Signed)
[place] [facility]) Care Management  10/26/2017  KIEFFER BLATZ November 01, 1940 [id number]   Successful call to patients wife, [name], [place] identifiers verified. Informed her that [place] require a different form of Income verification. She states that she will look for 1040 and fax them to me. Provided her with [facility] fax number.  Will follow up with patients wife in 3-5 business days.  [name] [place], [place] Management [phone]

## 2017-11-01 ENCOUNTER — Encounter: Payer: Self-pay | Admitting: Cardiovascular Disease

## 2017-11-01 ENCOUNTER — Telehealth: Payer: Self-pay | Admitting: Cardiovascular Disease

## 2017-11-01 DIAGNOSIS — N400 Enlarged prostate without lower urinary tract symptoms: Secondary | ICD-10-CM | POA: Diagnosis not present

## 2017-11-01 DIAGNOSIS — E1159 Type 2 diabetes mellitus with other circulatory complications: Secondary | ICD-10-CM | POA: Diagnosis not present

## 2017-11-01 DIAGNOSIS — E1169 Type 2 diabetes mellitus with other specified complication: Secondary | ICD-10-CM | POA: Diagnosis not present

## 2017-11-01 DIAGNOSIS — I10 Essential (primary) hypertension: Secondary | ICD-10-CM | POA: Diagnosis not present

## 2017-11-01 DIAGNOSIS — M25532 Pain in left wrist: Secondary | ICD-10-CM | POA: Diagnosis not present

## 2017-11-01 DIAGNOSIS — I2510 Atherosclerotic heart disease of native coronary artery without angina pectoris: Secondary | ICD-10-CM | POA: Diagnosis not present

## 2017-11-01 DIAGNOSIS — D638 Anemia in other chronic diseases classified elsewhere: Secondary | ICD-10-CM | POA: Diagnosis not present

## 2017-11-01 DIAGNOSIS — E669 Obesity, unspecified: Secondary | ICD-10-CM | POA: Diagnosis not present

## 2017-11-01 DIAGNOSIS — N183 Chronic kidney disease, stage 3 (moderate): Secondary | ICD-10-CM | POA: Diagnosis not present

## 2017-11-01 DIAGNOSIS — R079 Chest pain, unspecified: Secondary | ICD-10-CM

## 2017-11-01 DIAGNOSIS — E785 Hyperlipidemia, unspecified: Secondary | ICD-10-CM | POA: Diagnosis not present

## 2017-11-01 DIAGNOSIS — M182 Bilateral post-traumatic osteoarthritis of first carpometacarpal joints: Secondary | ICD-10-CM | POA: Diagnosis not present

## 2017-11-01 NOTE — Telephone Encounter (Signed)
Pt c/o Shortness Of Breath: STAT if SOB developed within the last 24 hours or pt is noticeably SOB on the phone  1. Are you currently SOB (can you hear that pt is SOB on the phone)? no  2. How long have you been experiencing SOB? Intermittent x months  3. Are you SOB when sitting or when up moving around? Exertion mostly sometimes at rest   4. Are you currently experiencing any other symptoms? Hurting middle if chest

## 2017-11-01 NOTE — Telephone Encounter (Signed)
Spoke with patient and he said that right now he is doing fine. He states that he went in for check up today and reports that he was told to call us due to reports of chest pain and shortness of breath with exertion. Reviewed previous visit notes and Dr. Rockey Situ suggested patient to call for stress test if his symptoms persisted. Scheduled him for lexiscan 11/06/17 at 08:00. Reviewed all instructions with him in detail and instructed him to hold metformin, Novolog, and decrease lantus the night before to 22 units. He verbalized understanding of all instructions with no further questions at this time. Reviewed all of this with patient and his wife and they both verbalized understanding with no further questions at this time.   [place]  Your caregiver has ordered a Stress Test with nuclear imaging. The purpose of this test is to evaluate the blood supply to your heart muscle. This procedure is referred to as a "Non-Invasive Stress Test." This is because other than having an IV started in your vein, nothing is inserted or "invades" your body. Cardiac stress tests are done to find areas of poor blood flow to the heart by determining the extent of coronary artery disease (CAD). Some patients exercise on a treadmill, which naturally increases the blood flow to your heart, while others who are  unable to walk on a treadmill due to physical limitations have a pharmacologic/chemical stress agent called Lexiscan . This medicine will mimic walking on a treadmill by temporarily increasing your coronary blood flow.   Please note: these test may take anywhere between 2-4 hours to complete  PLEASE REPORT TO [facility] WILL DIRECT YOU WHERE TO GO  Date of Procedure:___Tuesday 10/22___  Arrival Time for Procedure:___07:45AM_______  Instructions regarding medication:   _XX___ : Hold diabetes medication and insulin the morning of procedure  _XX___:  Decrease  Lantus to 22 units the night before.   PLEASE NOTIFY THE OFFICE AT LEAST 85 HOURS IN ADVANCE IF YOU ARE UNABLE TO KEEP YOUR APPOINTMENT.  [phone] AND  PLEASE NOTIFY NUCLEAR MEDICINE AT [facility] AT LEAST 24 HOURS IN ADVANCE IF YOU ARE UNABLE TO KEEP YOUR APPOINTMENT. [phone]  How to prepare for your Myoview test:  1. Do not eat or drink after midnight 2. No caffeine for 24 hours prior to test 3. No smoking 24 hours prior to test. 4. Your medication may be taken with water.  If your doctor stopped a medication because of this test, do not take that medication. 5. Ladies, please do not wear dresses.  Skirts or pants are appropriate. Please wear a short sleeve shirt. 6. No perfume, cologne or lotion. 7. Wear comfortable walking shoes. No heels!

## 2017-11-02 ENCOUNTER — Other Ambulatory Visit: Payer: Self-pay | Admitting: Pharmacy Technician

## 2017-11-02 NOTE — Patient Outreach (Signed)
[place] [facility]) Care Management  11/02/2017  ANTERRIO MCCLEERY 06-Nov-1940 [id number]   Unsuccessful call to patients wife, [name], HIPAA complaint voicemail left.  Will make 2nd call attempt in 2-3 business days.  [name] [place], [place] Management [phone]

## 2017-11-02 NOTE — Telephone Encounter (Signed)
Entered in error please ignore

## 2017-11-05 NOTE — Telephone Encounter (Signed)
Spoke with patients wife per release form and reviewed all instructions for tomorrows test. She verbalized understanding with no further questions at this time.

## 2017-11-05 NOTE — Telephone Encounter (Signed)
Patient wife calling  Would like to reconfirm medication information for tomorrow's lexiscan Please call cell to discuss

## 2017-11-06 ENCOUNTER — Encounter
Admission: RE | Admit: 2017-11-06 | Discharge: 2017-11-06 | Disposition: A | Discharge status: Home / Self Care | Payer: PPO | Source: Ambulatory Visit | Attending: Cardiovascular Disease | Admitting: Cardiovascular Disease

## 2017-11-06 DIAGNOSIS — R079 Chest pain, unspecified: Secondary | ICD-10-CM

## 2017-11-06 LAB — NM MYOCAR MULTI W/SPECT W/WALL MOTION / EF
LV dias vol: 101 mL (ref 62–150)
LV sys vol: 27 mL
Peak HR: 83 {beats}/min
Percent HR: 57 %
Rest HR: 47 {beats}/min
TID: 1.22

## 2017-11-06 MED ORDER — REGADENOSON 0.4 MG/5ML IV SOLN
0.4000 mg | Freq: Once | INTRAVENOUS | Status: AC
  Administered 2017-11-06: 0.4 mg via INTRAVENOUS

## 2017-11-06 MED ORDER — TECHNETIUM TC 99M TETROFOSMIN IV KIT
10.5200 | PACK | Freq: Once | INTRAVENOUS | Status: AC | PRN
  Administered 2017-11-06: 10.52 via INTRAVENOUS

## 2017-11-06 MED ORDER — TECHNETIUM TC 99M TETROFOSMIN IV KIT
32.9900 | PACK | Freq: Once | INTRAVENOUS | Status: AC | PRN
Start: 2017-11-06 — End: 2017-11-06
  Administered 2017-11-06: 32.99 via INTRAVENOUS

## 2017-11-07 ENCOUNTER — Other Ambulatory Visit: Payer: Self-pay | Admitting: Pharmacy Technician

## 2017-11-07 NOTE — Patient Outreach (Signed)
[place] [facility]) Care Management  11/07/2017  Ronald Wallace 1940-12-13 [id number]   Received patient [id number] document.  Faxed documents into [employer], will follow up with companies in 2-3 business days to check status of applications.  [name] [place], [place] Management [phone]

## 2017-11-08 NOTE — Telephone Encounter (Signed)
Patient wife calling to check on status of results Please call when available, try home phone first then Rebecca's cell

## 2017-11-08 NOTE — Telephone Encounter (Signed)
Dr Rockey Situ has not completed the final result of the myoview. Advised patient's wife that we will let them know the results as soon as we get them from Dr Rockey Situ. She verbalized understanding.

## 2017-11-13 ENCOUNTER — Other Ambulatory Visit: Payer: Self-pay | Admitting: Pharmacy Technician

## 2017-11-13 NOTE — Patient Outreach (Signed)
[facility]) Care Management  11/13/2017  DANIL WEDGE 05-13-1940 [id number]  Care coordination calls placed to both [name] and [employer] to inquire about the status of the patient's medication assistance applications for Novolog and Lantus respectively.  Spoke to [employer] at [name] and patient is approved from 11/13/17-12/15/17. Patient will receive a 90 days supply and it should be delivered within 7-10 business days of today.   Spoke to [place] at [employer] and patient is approved from 11/09/17-01/15/18. Patient will receive a 90 days supply (4 vials) and it should be delivered either late this week or early next week (it takes 5-7 business days to ship but last order is shipped on Wednesdays).  Will route note to [place] for followup.  Shirleymae Hauth P. Marlissa Emerick, [facility] [phone]

## 2017-11-14 ENCOUNTER — Telehealth: Payer: Self-pay | Admitting: Cardiovascular Disease

## 2017-11-14 NOTE — Telephone Encounter (Signed)
Patient wife calling asking about myoview patient did about two weeks ago she states  Please call back

## 2017-11-14 NOTE — Telephone Encounter (Signed)
Patient's wife would like to have the patient to come in sooner due to his SOB. Can you please schedule the patient for sooner???

## 2017-11-15 NOTE — Telephone Encounter (Signed)
Patient scheduled for 11/19/17 with Ronald Wallace

## 2017-11-17 NOTE — Progress Notes (Signed)
Cardiology Office Note Date:  11/19/2017  Patient ID:  Adelfo, Diebel 1940/03/20, MRN [id number] PCP:  [name], MD  Cardiologist:  Dr. Rockey Situ, MD    Chief Complaint: Follow up  History of Present Illness: DHRUVAN GULLION is a 77 y.o. male with history of CAD as detailed below, syncope, IDDM, CKD stage II, HTN, HLD, anemia, remote pipe smoking, and chronic SOB who presents for follow up of dyspnea .   Patient previously underwent LHC in 06/2015 by outside group that showed proximal to mid LAD 20% stenosis, proximal LCx 30% stenosis, OM2 50% stenosis, proximal RCA 100% stenosis with left-to-right collaterals. Normal LVEF. Medical therapy was advised. Prior echo from 02/2014 showed an EF of 50-55%, Gr1DD, mild MR, mildly dilated LA. Patient was last seen in the office in 07/2017 noting increased SOB with minimal exertion. He denied any chest pain. He was noted to have a sedentary lifestyle and previously felt better following cardiac rehab. It was felt he may be deconditioned and exercise regimen was advised at that time with consideration for stress testing if symptoms worsened.   Patient called on 11/01/17 with continued SOB and Lexiscan Myoview was ordered as noted from his last office visit with Dr. Rockey Situ in 07/2017 as above. He underwent Lexiscan Myoview on 10/22/109 that showed no significant ischemia or scar, LVEF 56%, TID was noted, read as a probable normal stress test. Patient called on 11/14/2017 asking for sooner appointment given his SOB.     Patient comes in accompanied by his wife today.  He continues to note profound dyspnea with minimal exertion.  He states he becomes significantly short of breath when walking from his house to the street to check the mail.  When questioned how long this distances both patient and wife indicate it is approximately 3-4 times the length of 1 of our exam rooms.  He was recently shopping in [place] with his wife and had to stop and rest  several times secondary to shortness of breath.  He has occasionally noted a substernal chest pressure that radiates to the left shoulder with exertion.  He is unclear if the symptoms resemble how he felt prior to his cardiac catheterization in 06/2015.  Upon questioning the patient and his wife today it appears that his cardiac catheterization was done in the setting of possible fall versus syncope in 06/2015.  He denies any recurrent syncopal episodes since then.  He is currently symptom-free.  He has recently bought a new bed/mattress that will elevate his head secondary to dyspnea.  With some head elevation he notes improved nocturnal breathing.  He notes some abdominal fullness and lower extremity swelling.  He denies any early satiety.  Both the patient and his wife are under significant amount of stress with a very sick family member.   Past Medical History:  Diagnosis Date  . Anemia   . CAD (coronary artery disease)    a. 02/2014 Neg Ex MV, EF 65%;  b. 06/2015 Abnl MV ([facility])-->Cath ([facility]): LM nl, LAD 30p/m, LCX 30p, OM2 50p, RCA 100p w/ L-->R collats, nl EF-->Med Rx.  . Diabetes mellitus, type II, insulin dependent ([place])    With neuropathy  . Diverticulitis   . Essential hypertension   . Hyperlipidemia   . Neuropathy   . Shortness of breath dyspnea   . Syncope and collapse    a. 02/2014 Carotid Dopplers: 1-39% bilateral ICA with normal Subclavian & vertebral arteries; b. 02/2014 2 D Echo: EF 50-55%,  Gr 1 DD, Mild MR & LA dilation -- was bradycardic in 40s.    Past Surgical History:  Procedure Laterality Date  . CARDIAC CATHETERIZATION Left 07/07/2015   Procedure: Left Heart Cath and Coronary Angiography;  Surgeon: Isaias Cowman, MD;  Location: [place] [facility];  Service: Cardiovascular;  Laterality: Left;  . CARPAL TUNNEL RELEASE    . CATARACT EXTRACTION Right   . CATARACT EXTRACTION W/PHACO Left 10/06/2015   Procedure: CATARACT EXTRACTION PHACO AND INTRAOCULAR LENS  PLACEMENT (IOC);  Surgeon: Estill Cotta, MD;  Location: [facility] ORS;  Service: Ophthalmology;  Laterality: Left;  [place] 01:42 AP% 25.1 CDE 45.92 Fluid pack lot # [id number]  . NM MYOVIEW LTD  03/13/2014   LOW RISK - positive EKG but negative imaging: 2 mm ST Depression in V4 & V5 but No scintigraphic evidence of Ischemia or Infarct o  . TRANSTHORACIC ECHOCARDIOGRAM  03/12/2014   EF 50-55%, Gr 1 DD, no RWMA, Mlid MR & LA dil, Normla RV and bradycardic 40s -    Current Meds  Medication Sig  . aspirin 81 MG tablet Take 81 mg by mouth daily.  . calcium carbonate (OS-CAL) 600 MG TABS tablet Take 600 mg by mouth daily with breakfast.  . cholecalciferol (VITAMIN D) 1000 UNITS tablet Take 1,000 Units by mouth daily.  . Ferrous Sulfate (IRON) 325 (65 FE) MG TABS Take 1 tablet by mouth daily.   Marland Kitchen gabapentin (NEURONTIN) 300 MG capsule Take 600 mg by mouth at bedtime.  . isosorbide mononitrate (IMDUR) 30 MG 24 hr tablet TAKE 1 TABLET BY MOUTH ONCE DAILY  . LANTUS 100 UNIT/ML injection Inject 45 Units into the skin daily. Take at bedtime  . losartan (COZAAR) 50 MG tablet Take 50 mg by mouth daily.   . metFORMIN (GLUCOPHAGE) 500 MG tablet Take 250 mg by mouth 2 (two) times daily with a meal.  . NOVOLOG 100 UNIT/ML injection Inject into the skin 3 (three) times daily with meals. 20 units am, 26 units lunch, 26 units supper  . PARoxetine (PAXIL) 30 MG tablet Take 1 tablet by mouth nightly  . simvastatin (ZOCOR) 20 MG tablet Take 20 mg by mouth daily.  . tamsulosin (FLOMAX) 0.4 MG CAPS capsule Take 0.4 mg by mouth daily.    Allergies:   Amlodipine   Social History:  The patient  reports that he has never smoked. He has never used smokeless tobacco. He reports that he does not drink alcohol or use drugs.   Family History:  The patient's family history includes Heart attack in his mother.  ROS:   Review of Systems  Constitutional: Positive for malaise/fatigue. Negative for chills, diaphoresis, fever and  weight loss.  HENT: Negative for congestion.   Eyes: Negative for discharge and redness.  Respiratory: Positive for shortness of breath. Negative for cough, hemoptysis, sputum production and wheezing.   Cardiovascular: Positive for chest pain, orthopnea and leg swelling. Negative for palpitations, claudication and PND.  Gastrointestinal: Negative for abdominal pain, blood in stool, heartburn, melena, nausea and vomiting.  Genitourinary: Negative for hematuria.  Musculoskeletal: Negative for falls and myalgias.  Skin: Negative for rash.  Neurological: Positive for weakness. Negative for dizziness, tingling, tremors, sensory change, speech change, focal weakness and loss of consciousness.  Endo/Heme/Allergies: Does not bruise/bleed easily.  Psychiatric/Behavioral: Negative for substance abuse. The patient is nervous/anxious.   All other systems reviewed and are negative.    PHYSICAL EXAM:  VS:  BP 120/62 (BP Location: Left Arm, Patient Position: Sitting, Cuff  Size: Normal)   Pulse (!) 56   Ht 5' 8.5" (1.74 m)   Wt 202 lb 4 oz (91.7 kg)   BMI 30.30 kg/m  BMI: Body mass index is 30.3 kg/m.  Physical Exam  Constitutional: He is oriented to person, place, and time. He appears well-developed and well-nourished.  HENT:  Head: Normocephalic and atraumatic.  Eyes: Right eye exhibits no discharge. Left eye exhibits no discharge.  Neck: Normal range of motion. No JVD present.  Cardiovascular: Regular rhythm, S1 normal and S2 normal. Bradycardia present. Exam reveals no distant heart sounds, no friction rub, no midsystolic click and no opening snap.  Murmur heard. High-pitched blowing holosystolic murmur is present with a grade of 2/6 at the apex. Pulses:      Posterior tibial pulses are 2+ on the right side, and 2+ on the left side.  Pulmonary/Chest: Effort normal and breath sounds normal. No respiratory distress. He has no decreased breath sounds. He has no wheezes. He has no rales. He  exhibits no tenderness.  Abdominal: Soft. He exhibits no distension. There is no tenderness.  Musculoskeletal: He exhibits no edema.  Neurological: He is alert and oriented to person, place, and time.  Skin: Skin is warm and dry. No cyanosis. Nails show no clubbing.  Psychiatric: He has a normal mood and affect. His speech is normal and behavior is normal. Judgment and thought content normal.     EKG:  Was ordered and interpreted by me today. Shows sinus bradycardia, 56 bpm, nonspecific st/t changes   Recent Labs: No results found for requested labs within last 8760 hours.  No results found for requested labs within last 8760 hours.   CrCl cannot be calculated (Patient's most recent lab result is older than the maximum 21 days allowed.).   Wt Readings from Last 3 Encounters:  11/19/17 202 lb 4 oz (91.7 kg)  08/08/17 203 lb (92.1 kg)  05/30/17 197 lb 3.2 oz (89.4 kg)     Other studies reviewed: Additional studies/records reviewed today include: summarized above  ASSESSMENT AND PLAN:  1. CAD involving the native coronary arteries with dyspnea concerning for anginal equivalent and stable angina.  Currently symptom-free.  Long discussion with patient and wife regarding prior cardiac catheterization, recent stress test from 10/2017, and ongoing symptoms concerning for angina.  We have recommended right and left cardiac catheterization to further evaluate his symptoms.  Risks and benefits of cardiac catheterization have been discussed with the patient including risks of bleeding, bruising, infection, kidney damage, stroke, heart attack, and death. The patient understands these risks and is willing to proceed with the procedure. All questions have been answered and concerns listened to.  Patient has declined further ischemic evaluation at this time stating they will call us when they are ready.  They prefer not to schedule cardiac catheterization at this time given ongoing family concerns.   Patient was made aware of risks regarding this.  Continue aspirin 81 mg daily, and statin.  Patient will call when ready for catheterization and will be scheduled at that time.  He prefers catheterization to be done by Dr. Rockey Situ.  Given his significant symptoms, would defer further increase in exercise until ischemic evaluation has been completed.   2. Hypertension: Blood pressure well controlled.  Continue Imdur, Cozaar.  3. Hyperlipidemia: LDL of 39 from 10/2017 with normal LFT at that time.  Remains on simvastatin per PCP.  4. CKD stage III Serum creatinine stable at 1.5 from labs on 10/2017.  Disposition:  F/u with Dr. Rockey Situ in 2 weeks.    Current medicines are reviewed at length with the patient today.  The patient did not have any concerns regarding medicines.  Signed, Christell Faith, PA-C 11/19/2017 10:33 AM     [place] [address] [place] [address] [place], [place] [address] [phone]

## 2017-11-19 ENCOUNTER — Telehealth: Payer: Self-pay | Admitting: Physician Assistant

## 2017-11-19 ENCOUNTER — Ambulatory Visit (INDEPENDENT_AMBULATORY_CARE_PROVIDER_SITE_OTHER): Payer: PPO | Admitting: Physician Assistant

## 2017-11-19 ENCOUNTER — Other Ambulatory Visit: Payer: Self-pay | Admitting: Physician Assistant

## 2017-11-19 ENCOUNTER — Encounter: Payer: Self-pay | Admitting: *Deleted

## 2017-11-19 ENCOUNTER — Encounter: Payer: Self-pay | Admitting: Physician Assistant

## 2017-11-19 VITALS — BP 120/62 | HR 56 | Ht 68.5 in | Wt 202.2 lb

## 2017-11-19 DIAGNOSIS — I25118 Atherosclerotic heart disease of native coronary artery with other forms of angina pectoris: Secondary | ICD-10-CM | POA: Diagnosis not present

## 2017-11-19 DIAGNOSIS — I10 Essential (primary) hypertension: Secondary | ICD-10-CM

## 2017-11-19 DIAGNOSIS — R0609 Other forms of dyspnea: Secondary | ICD-10-CM | POA: Diagnosis not present

## 2017-11-19 DIAGNOSIS — N183 Chronic kidney disease, stage 3 unspecified: Secondary | ICD-10-CM

## 2017-11-19 DIAGNOSIS — E782 Mixed hyperlipidemia: Secondary | ICD-10-CM | POA: Diagnosis not present

## 2017-11-19 DIAGNOSIS — I2510 Atherosclerotic heart disease of native coronary artery without angina pectoris: Secondary | ICD-10-CM | POA: Insufficient documentation

## 2017-11-19 DIAGNOSIS — Z01818 Encounter for other preprocedural examination: Secondary | ICD-10-CM

## 2017-11-19 DIAGNOSIS — R0600 Dyspnea, unspecified: Secondary | ICD-10-CM

## 2017-11-19 NOTE — Patient Instructions (Signed)
Medication Instructions:  No changes  If you need a refill on your cardiac medications before your next appointment, please call your pharmacy.   Lab work: None ordered  Testing/Procedures: Please call us when you are ready to schedule the cardiac catheterization at [phone]

## 2017-11-19 NOTE — Telephone Encounter (Signed)
Patient's wife and daughter have been made aware that the cardiac cath has been scheduled for 11/24/27 with Dr. Rockey Situ.  Instructions have been sent to [place].   The wife has verbalized her understanding for the patient to: Arrive by 6:30 at the [facility] Have labs drawn 11/5 Hold Metformin the morning of the procedure and then 48 hours after Take half the dose of the Lantus the night before the procedure Take the 81 mg aspirin that morning.  She will call back with any questions or concerns.

## 2017-11-19 NOTE — Progress Notes (Signed)
Orders for right and left cardiac catheterization placed.

## 2017-11-19 NOTE — Telephone Encounter (Signed)
Patient daughter requesting a phone call back  Would like results from [place] Results Just to really understand why patient may need Cath done  Please advise

## 2017-11-19 NOTE — Telephone Encounter (Signed)
Spoke with patient's daughter (at request of patient and his wife). Daughter agrees patient needs a cath. Patient prefers Dr. Rockey Situ perform the cath. Daughter is hoping cath can be done on 11/8. If possible, please schedule R/LHC with Dr. Rockey Situ on 11/8. Dx: dyspnea concerning for anginal equivalent, CAD involving the native coronary arteries with angina, abnormal stress test with elevated TID ratio. Once scheduled, patient will need pre-cath labs. Please call patient and daughter [place] with dates/time.

## 2017-11-20 ENCOUNTER — Encounter: Payer: Self-pay | Admitting: *Deleted

## 2017-11-20 ENCOUNTER — Telehealth: Payer: Self-pay | Admitting: *Deleted

## 2017-11-20 ENCOUNTER — Other Ambulatory Visit: Payer: Self-pay | Admitting: Cardiovascular Disease

## 2017-11-20 ENCOUNTER — Other Ambulatory Visit
Admission: RE | Admit: 2017-11-20 | Discharge: 2017-11-20 | Disposition: A | Discharge status: Home / Self Care | Payer: PPO | Source: Ambulatory Visit | Attending: Physician Assistant | Admitting: Physician Assistant

## 2017-11-20 DIAGNOSIS — Z01818 Encounter for other preprocedural examination: Secondary | ICD-10-CM | POA: Diagnosis not present

## 2017-11-20 DIAGNOSIS — I25118 Atherosclerotic heart disease of native coronary artery with other forms of angina pectoris: Secondary | ICD-10-CM | POA: Insufficient documentation

## 2017-11-20 LAB — CBC
HCT: 32.7 % — ABNORMAL LOW (ref 39.0–52.0)
Hemoglobin: 11.4 g/dL — ABNORMAL LOW (ref 13.0–17.0)
MCH: 30.3 pg (ref 26.0–34.0)
MCHC: 34.9 g/dL (ref 30.0–36.0)
MCV: 87 fL (ref 80.0–100.0)
Platelets: 152 10*3/uL (ref 150–400)
RBC: 3.76 MIL/uL — ABNORMAL LOW (ref 4.22–5.81)
RDW: 11.1 % — ABNORMAL LOW (ref 11.5–15.5)
WBC: 6.8 10*3/uL (ref 4.0–10.5)
nRBC: 0 % (ref 0.0–0.2)

## 2017-11-20 LAB — BASIC METABOLIC PANEL
Anion gap: 5 (ref 5–15)
BUN: 24 mg/dL — ABNORMAL HIGH (ref 8–23)
CO2: 29 mmol/L (ref 22–32)
Calcium: 9.6 mg/dL (ref 8.9–10.3)
Chloride: 104 mmol/L (ref 98–111)
Creatinine, Ser: 1.39 mg/dL — ABNORMAL HIGH (ref 0.61–1.24)
GFR calc Af Amer: 55 mL/min — ABNORMAL LOW (ref >60)
GFR calc non Af Amer: 48 mL/min — ABNORMAL LOW (ref >60)
Glucose, Bld: 107 mg/dL — ABNORMAL HIGH (ref 70–99)
Potassium: 4.2 mmol/L (ref 3.5–5.1)
Sodium: 138 mmol/L (ref 135–145)

## 2017-11-20 NOTE — Telephone Encounter (Signed)
Patient's wife called back stating that they could not do the cath on 11/7. The cath has been moved to Wednesday 11/6 at 9:30. He will arrive by 8:30 to the [facility]. Pre cath labs were completed today.

## 2017-11-20 NOTE — Telephone Encounter (Signed)
Patient's wife has been made aware of the schedule change. The patient will arrive by 8:30 on 11/7 for the procedure.

## 2017-11-20 NOTE — Telephone Encounter (Signed)
Left a message for the patient to call back.  Cardiac cath scheduled for 11/8 has now been moved to 11/7 at 9:30. Patient will need to arrive by 8:30.

## 2017-11-20 NOTE — Telephone Encounter (Signed)
Please call wife regarding his medications for his cath, which she states is tomorrow.

## 2017-11-20 NOTE — Telephone Encounter (Signed)
Patient's wife called for clarification of medication to hold for the cath tomorrow.  Medication instructions in preparation for your procedure: Hold Metformin the morning of the procedure and 48 hours after. Take half the dose of the Lantus the night before the procedure. Hold all diabetic medication the morning of the procedure.  Hold the hydrochlorothiazide the morning of the procedure.  She has verbalized her understanding.

## 2017-11-20 NOTE — Telephone Encounter (Signed)
Patient wife calling back to discuss date issues with cath.  She would like to possibly change to Wednesday 11/6 if someone will be available for PCI if needed .  Patient doesn't understand why dr. Rockey Situ cannot do PCI prn   Please call.

## 2017-11-21 ENCOUNTER — Ambulatory Visit
Admission: RE | Admit: 2017-11-21 | Discharge: 2017-11-21 | Disposition: A | Discharge status: Home / Self Care | Payer: PPO | Source: Ambulatory Visit | Attending: Cardiovascular Disease | Admitting: Cardiovascular Disease

## 2017-11-21 ENCOUNTER — Other Ambulatory Visit: Payer: Self-pay

## 2017-11-21 ENCOUNTER — Encounter
Admission: RE | Disposition: A | Discharge status: Home / Self Care | Payer: Self-pay | Source: Ambulatory Visit | Attending: Cardiovascular Disease

## 2017-11-21 DIAGNOSIS — N183 Chronic kidney disease, stage 3 (moderate): Secondary | ICD-10-CM | POA: Diagnosis not present

## 2017-11-21 DIAGNOSIS — Z9889 Other specified postprocedural states: Secondary | ICD-10-CM | POA: Insufficient documentation

## 2017-11-21 DIAGNOSIS — I2584 Coronary atherosclerosis due to calcified coronary lesion: Secondary | ICD-10-CM | POA: Diagnosis not present

## 2017-11-21 DIAGNOSIS — E1142 Type 2 diabetes mellitus with diabetic polyneuropathy: Secondary | ICD-10-CM | POA: Insufficient documentation

## 2017-11-21 DIAGNOSIS — E1122 Type 2 diabetes mellitus with diabetic chronic kidney disease: Secondary | ICD-10-CM | POA: Diagnosis not present

## 2017-11-21 DIAGNOSIS — I25119 Atherosclerotic heart disease of native coronary artery with unspecified angina pectoris: Secondary | ICD-10-CM | POA: Insufficient documentation

## 2017-11-21 DIAGNOSIS — Z8249 Family history of ischemic heart disease and other diseases of the circulatory system: Secondary | ICD-10-CM | POA: Insufficient documentation

## 2017-11-21 DIAGNOSIS — Z9841 Cataract extraction status, right eye: Secondary | ICD-10-CM | POA: Diagnosis not present

## 2017-11-21 DIAGNOSIS — Z9842 Cataract extraction status, left eye: Secondary | ICD-10-CM | POA: Insufficient documentation

## 2017-11-21 DIAGNOSIS — Z955 Presence of coronary angioplasty implant and graft: Secondary | ICD-10-CM | POA: Diagnosis not present

## 2017-11-21 DIAGNOSIS — I129 Hypertensive chronic kidney disease with stage 1 through stage 4 chronic kidney disease, or unspecified chronic kidney disease: Secondary | ICD-10-CM | POA: Diagnosis not present

## 2017-11-21 DIAGNOSIS — Z87891 Personal history of nicotine dependence: Secondary | ICD-10-CM | POA: Insufficient documentation

## 2017-11-21 DIAGNOSIS — R0609 Other forms of dyspnea: Secondary | ICD-10-CM | POA: Insufficient documentation

## 2017-11-21 DIAGNOSIS — Z888 Allergy status to other drugs, medicaments and biological substances status: Secondary | ICD-10-CM | POA: Diagnosis not present

## 2017-11-21 DIAGNOSIS — I2510 Atherosclerotic heart disease of native coronary artery without angina pectoris: Secondary | ICD-10-CM | POA: Diagnosis not present

## 2017-11-21 DIAGNOSIS — R079 Chest pain, unspecified: Secondary | ICD-10-CM | POA: Diagnosis present

## 2017-11-21 DIAGNOSIS — E785 Hyperlipidemia, unspecified: Secondary | ICD-10-CM | POA: Diagnosis not present

## 2017-11-21 HISTORY — PX: RIGHT/LEFT HEART CATH AND CORONARY ANGIOGRAPHY: CATH118266

## 2017-11-21 LAB — GLUCOSE, CAPILLARY: Glucose-Capillary: 226 mg/dL — ABNORMAL HIGH (ref 70–99)

## 2017-11-21 SURGERY — RIGHT/LEFT HEART CATH AND CORONARY ANGIOGRAPHY
Anesthesia: Moderate Sedation

## 2017-11-21 MED ORDER — IOPAMIDOL (ISOVUE-300) INJECTION 61%
INTRAVENOUS | Status: DC | PRN
  Administered 2017-11-21: 90 mL via INTRA_ARTERIAL

## 2017-11-21 MED ORDER — FENTANYL CITRATE (PF) 100 MCG/2ML IJ SOLN
INTRAMUSCULAR | Status: DC | PRN
  Administered 2017-11-21: 25 ug via INTRAVENOUS

## 2017-11-21 MED ORDER — SODIUM CHLORIDE 0.9 % WEIGHT BASED INFUSION: 1.0000 mL/kg/h | INTRAVENOUS | Status: DC

## 2017-11-21 MED ORDER — SODIUM CHLORIDE 0.9% FLUSH: 3.0000 mL | Freq: Two times a day (BID) | INTRAVENOUS | Status: DC

## 2017-11-21 MED ORDER — ONDANSETRON HCL 4 MG/2ML IJ SOLN: 4.0000 mg | Freq: Four times a day (QID) | INTRAMUSCULAR | Status: DC | PRN

## 2017-11-21 MED ORDER — SODIUM CHLORIDE 0.9 % WEIGHT BASED INFUSION
3.0000 mL/kg/h | INTRAVENOUS | Status: AC
  Administered 2017-11-21: 3 mL/kg/h via INTRAVENOUS

## 2017-11-21 MED ORDER — SODIUM CHLORIDE 0.9 % IV SOLN: 250.0000 mL | INTRAVENOUS | Status: DC | PRN

## 2017-11-21 MED ORDER — SODIUM CHLORIDE 0.9% FLUSH: 3.0000 mL | INTRAVENOUS | Status: DC | PRN

## 2017-11-21 MED ORDER — ASPIRIN 81 MG PO CHEW: 81.0000 mg | CHEWABLE_TABLET | ORAL | Status: DC

## 2017-11-21 MED ORDER — HEPARIN (PORCINE) IN NACL 1000-0.9 UT/500ML-% IV SOLN
INTRAVENOUS | Status: AC
  Filled 2017-11-21: qty 1000

## 2017-11-21 MED ORDER — MIDAZOLAM HCL 2 MG/2ML IJ SOLN
INTRAMUSCULAR | Status: AC
  Filled 2017-11-21: qty 2

## 2017-11-21 MED ORDER — FENTANYL CITRATE (PF) 100 MCG/2ML IJ SOLN
INTRAMUSCULAR | Status: AC
  Filled 2017-11-21: qty 2

## 2017-11-21 MED ORDER — ACETAMINOPHEN 325 MG PO TABS: 650.0000 mg | ORAL_TABLET | ORAL | Status: DC | PRN

## 2017-11-21 MED ORDER — MIDAZOLAM HCL 2 MG/2ML IJ SOLN
INTRAMUSCULAR | Status: DC | PRN
  Administered 2017-11-21: 1 mg via INTRAVENOUS

## 2017-11-21 SURGICAL SUPPLY — 12 items
CATH INFINITI 5FR ANG PIGTAIL (CATHETERS) ×2 IMPLANT
CATH INFINITI 5FR JL4 (CATHETERS) ×2 IMPLANT
CATH INFINITI JR4 5F (CATHETERS) ×2 IMPLANT
CATH SWANZ 7F THERMO (CATHETERS) ×2 IMPLANT
DEVICE CLOSURE MYNXGRIP 5F (Vascular Products) ×2 IMPLANT
KIT MANI 3VAL PERCEP (MISCELLANEOUS) ×2 IMPLANT
KIT RIGHT HEART (MISCELLANEOUS) ×2 IMPLANT
NEEDLE PERC 18GX7CM (NEEDLE) ×2 IMPLANT
PACK CARDIAC CATH (CUSTOM PROCEDURE TRAY) ×2 IMPLANT
SHEATH AVANTI 5FR X 11CM (SHEATH) ×2 IMPLANT
SHEATH AVANTI 7FRX11 (SHEATH) ×2 IMPLANT
WIRE GUIDERIGHT .035X150 (WIRE) ×2 IMPLANT

## 2017-11-23 ENCOUNTER — Telehealth: Payer: Self-pay | Admitting: Cardiovascular Disease

## 2017-11-23 SURGERY — RIGHT/LEFT HEART CATH AND CORONARY ANGIOGRAPHY
Anesthesia: Moderate Sedation

## 2017-11-23 NOTE — Telephone Encounter (Signed)
Please call regarding cardiac rehab and follow up appointment from rehab

## 2017-11-23 NOTE — Telephone Encounter (Signed)
Spoke with patients wife per release form and reviewed that we have patient scheduled to come in on 12/07/17 at 10:00 AM and that at that time they will determine when he can return to rehab program. She verbalized understanding with no further questions at this time.

## 2017-12-03 DIAGNOSIS — C44619 Basal cell carcinoma of skin of left upper limb, including shoulder: Secondary | ICD-10-CM | POA: Diagnosis not present

## 2017-12-05 ENCOUNTER — Other Ambulatory Visit: Payer: Self-pay | Admitting: Pharmacy Technician

## 2017-12-05 ENCOUNTER — Other Ambulatory Visit: Payer: Self-pay | Admitting: Pharmacist

## 2017-12-05 NOTE — Patient Outreach (Signed)
[facility]) Care Management  12/05/2017  Ronald Wallace 06-Sep-1940 [id number]   Successful call to patients wife, [name], [place] identifiers verified. Mrs. Deroo confirms that they received his Novolog and Lantus from [place] patient assistance. Informed her that the program ends at the end of the year. Also confirmed she had my information and informed her to reach out to me next year when he has spent about $600 OOP to restart the process. She stated she would.  Will route note to [place] for case closure.  [name] [name] [place] Certified Pharmacy Technician [place] Management Direct Dial:[phone]

## 2017-12-05 NOTE — Patient Outreach (Signed)
[facility] ([facility]) Care Management [facility]  12/05/2017  Ronald Wallace 07/04/1940 [id number]  Reason for referral: medication assistance  Note routed from [facility] pharmacy technician that patient has received Novolog and Lantus.  No further medication questions at this time.   Plan: [facility] pharmacy case is being closed due to the following reasons:  Goals have been met.   I am happy to assist in the future as needed.    , PharmD, BCPS Clinical Pharmacist [facility] [phone]      

## 2017-12-06 NOTE — Progress Notes (Signed)
Office Visit    Patient Name: Ronald Wallace Date of Encounter: 12/07/2017  Primary Care Provider:  [name], MD Primary Cardiologist:  Ida Rogue, MD  Chief Complaint    Ronald Wallace is a 77 year old man with past history of CAD, DMII- insulin dep., HTN, HLD, neuropathy, and syncope. Presents today for follow up of recent right and left heart cath and SOB.   Past Medical History    Past Medical History:  Diagnosis Date  . Anemia   . CAD (coronary artery disease)    a. 02/2014 Neg Ex MV, EF 65%;  b. 06/2015 Abnl MV ([facility])-->Cath ([facility]): LM nl, LAD 30p/m, LCX 30p, OM2 50p, RCA 100p w/ L-->R collats, nl EF-->Med Rx; c. 10/2017 MV: No isch/scar; d. 11/2017 Cath: LM nl, LAD 45p/m, LCX 30ost, OM2 30, RCA 100 CTO, L->R collats. PA 31/16/22, PCWP 11.  . Diabetes mellitus, type II, insulin dependent (HCC)    With neuropathy  . Diverticulitis   . Essential hypertension   . Hyperlipidemia   . Neuropathy   . Shortness of breath dyspnea   . Syncope and collapse    a. 02/2014 Carotid Dopplers: 1-39% bilateral ICA with normal Subclavian & vertebral arteries; b. 02/2014 2 D Echo: EF 50-55%, Gr 1 DD, Mild MR & LA dilation -- was bradycardic in 40s; c. 04/2014 Event monitor: RSR/SA/SB. Occas PVC's. No AFib.   Past Surgical History:  Procedure Laterality Date  . CARDIAC CATHETERIZATION Left 07/07/2015   Procedure: Left Heart Cath and Coronary Angiography;  Surgeon: Isaias Cowman, MD;  Location: [place] [facility];  Service: Cardiovascular;  Laterality: Left;  . CARPAL TUNNEL RELEASE    . CATARACT EXTRACTION Right   . CATARACT EXTRACTION W/PHACO Left 10/06/2015   Procedure: CATARACT EXTRACTION PHACO AND INTRAOCULAR LENS PLACEMENT (IOC);  Surgeon: Estill Cotta, MD;  Location: [facility];  Service: Ophthalmology;  Laterality: Left;  [place] 01:42 AP% 25.1 CDE 45.92 Fluid pack lot # [id number]  . NM MYOVIEW LTD  03/13/2014   LOW RISK - positive EKG but negative imaging: 2 mm  ST Depression in V4 & V5 but No scintigraphic evidence of Ischemia or Infarct o  . RIGHT/LEFT HEART CATH AND CORONARY ANGIOGRAPHY N/A 11/21/2017   Procedure: RIGHT/LEFT HEART CATH AND CORONARY ANGIOGRAPHY;  Surgeon: Minna Merritts, MD;  Location: [place] [facility];  Service: Cardiovascular;  Laterality: N/A;  . TRANSTHORACIC ECHOCARDIOGRAM  03/12/2014   EF 50-55%, Gr 1 DD, no RWMA, Mlid MR & LA dil, Normla RV and bradycardic 40s -    Allergies  Allergies  Allergen Reactions  . Amlodipine     Other reaction(s): Other (See Comments) Peripheral edema    History of Present Illness    Ronald Wallace is a 77 year old man with past history of CAD, DMII- insulin dependent, HTN, HLD, neuropathy, and syncope.  Syncope was prev w/u w/ echo in 02/2014 showing nl EF, carotid u/s w/o significant dzs, and event monitor w/o any significant arrhythmias.  In 2017, in the setting of progressive dyspnea, he underwent stress testing @ [facility], which was followed by cath revealing nl EF, w/ a CTO of the RCA and otw nonobs dzs.  He has been having worsening SOB with minimal exertion and it was felt to be related to deconditioning initially, but dyspnea progressed and he underwent Lexiscan Myoview in October that showed no significant ischemia. He continued to have worsening DOE and when seen on 11/4, decision was made to pursue  LHC. This was performed 11/6 and showed stable, mod LAD/LCX dzs w/ CTO of the RCA and L  R collaterals.  R heart pressures were relatively nl. Continued medication management was recommended as well as enrollment in cardiac rehab.   Since his catheterization, he has continued to have dyspnea on exertion with minimal activity such as walking up a few steps in his home or walking to his mailbox.  He has not had any chest pain.  He denies PND, orthopnea, dizziness, syncope, or early satiety.  He does have mild lower extremity swelling and admits to eating out about 5 times a week.  Upon further  questioning, he says that he has noted tachypalpitations in the setting of dyspnea.  These do occur with exertion but also sometimes at rest.  Home Medications    Prior to Admission medications   Medication Sig Start Date End Date Taking? Authorizing Provider  aspirin EC 81 MG tablet Take 81 mg by mouth daily.    [provider]  calcium carbonate (OS-CAL) 600 MG TABS tablet Take 600 mg by mouth daily with breakfast.    [provider]  cholecalciferol (VITAMIN D) 1000 UNITS tablet Take 2,000 Units by mouth daily.     [provider]  gabapentin (NEURONTIN) 300 MG capsule Take 300-600 mg by mouth See admin instructions. Take 1 capsule (300 mg) by mouth in the morning & 2 capsules (600 mg) by mouth every night.    [provider]  hydrochlorothiazide (MICROZIDE) 12.5 MG capsule Take 12.5 mg by mouth daily with breakfast.    [provider]  isosorbide mononitrate (IMDUR) 30 MG 24 hr tablet TAKE 1 TABLET BY MOUTH ONCE DAILY Patient taking differently: Take 15 mg by mouth every evening.  10/08/17   [name], Kathlene November, MD  LANTUS 100 UNIT/ML injection Inject 45 Units into the skin at bedtime. Take at bedtime 06/19/15   [provider]  lisinopril (PRINIVIL,ZESTRIL) 20 MG tablet Take 20 mg by mouth daily.    [provider]  losartan (COZAAR) 50 MG tablet Take 50 mg by mouth daily.  06/14/17   [provider]  metFORMIN (GLUCOPHAGE) 500 MG tablet Take 250 mg by mouth 2 (two) times daily with a meal.    [provider]  NOVOLOG 100 UNIT/ML injection Inject 20-38 Units into the skin 3 (three) times daily with meals. Sliding scale insulin base rates= 20 units in the morning, 26 units with lunch, 26 units with supper. 05/06/15   [provider]  PARoxetine (PAXIL) 20 MG tablet Take 20 mg by mouth daily.    [provider]  simvastatin (ZOCOR) 20 MG tablet Take 20 mg by mouth daily.    [provider]    tamsulosin (FLOMAX) 0.4 MG CAPS capsule Take 0.4 mg by mouth daily.    [provider]    Review of Systems    Ongoing dyspnea on exertion.  Intermittent tachypalpitations associated with dyspnea.  He denies chest pain, PND, orthopnea, dizziness, syncope, or early satiety.  Mild lower extremity swelling which is dependent in nature.  All other systems reviewed and are otherwise negative except as noted above.  Physical Exam    VS:  BP 102/60 (BP Location: Left Arm, Patient Position: Sitting, Cuff Size: Normal)   Pulse 65   Ht 5' 8.5" (1.74 m)   Wt 201 lb (91.2 kg)   BMI 30.12 kg/m  , BMI Body mass index is 30.12 kg/m. GEN: Well nourished, well  developed, in no acute distress. HEENT: normal. Neck: Supple, no JVD, carotid bruits, or masses. Cardiac: RRR, no murmurs, rubs, or gallops. No clubbing, cyanosis, trace to 1+ bilateral ankle edema.  Radials/PT 1+ and equal bilaterally.  Right groin catheterization site without bleeding, bruit, or hematoma. Respiratory:  Respirations regular and unlabored, clear to auscultation bilaterally. GI: Obese, soft, nontender, nondistended, BS + x 4. MS: no deformity or atrophy. Skin: warm and dry, no rash. Neuro:  Strength and sensation are intact. Psych: Normal affect.  Accessory Clinical Findings    ECG personally reviewed by me today -regular sinus rhythm, 65, nonspecific ST/T changes- no acute changes.  Assessment & Plan    1.  Dyspnea on exertion: Patient has been experiencing dyspnea on exertion for roughly 3 years.  It has progressed some.  He was recently evaluated with stress testing which showed normal LV function and no ischemia.  Due to persistent symptoms, he underwent catheterization which showed stable moderate coronary disease in the left coronary tree with a chronic total occlusion of the RCA and left to right collaterals.  He has continued to have dyspnea on minimal exertion.  Today, he reports that sometimes he notes  tachypalpitations in the setting of this dyspnea.  In that setting, I have offered to place a Zio monitor for 14 days to evaluate for arrhythmias, which may be contributing to dyspnea however, patient prefers to hold off on this at this time.  I also note that he has not had a chest x-ray in either our system or in care everywhere since at least 2013.  I offered to arrange for this and he again deferred.  He says he has follow-up with primary care in December and will readdress at that point.  He does have mild ankle edema and a history of grade 1 diastolic dysfunction.  We discussed the importance of daily weights, sodium restriction, medication compliance, and symptom reporting and he verbalizes understanding.   2.  Coronary artery disease: Status post recent catheterization revealing stable disease with a chronic total occlusion of the right coronary artery and left to right collaterals.  He has not been having chest pain.  Continue dyspnea as above.  No further ischemic evaluation.  Continue aspirin, nitrate, and ARB.  3.  Palpitations: As above, he has noted elevated heart rates in the setting of dyspnea.  This sometimes occurs at rest, prompting me to suggest event monitoring.  He is not currently interested but will consider in the future.    4.  Essential hypertension: Stable.  5.  Hyperlipidemia: LDL 39 with normal LFTs in October.  Continue statin therapy.  6.  Stage III chronic kidney disease: Creatinine 1.39 in November 5.  Continue ARB.  7.  Type 2 diabetes mellitus: A1c was 7.8 in August and he is followed by primary care.    8.  Disposition: Follow-up in 3 months or sooner if necessary.  Patient will consider event monitoring.  Murray Hodgkins, NP 12/07/2017, 12:30 PM

## 2017-12-07 ENCOUNTER — Ambulatory Visit: Payer: PPO | Admitting: Nurse Practitioner

## 2017-12-07 ENCOUNTER — Encounter: Payer: Self-pay | Admitting: Nurse Practitioner

## 2017-12-07 VITALS — BP 102/60 | HR 65 | Ht 68.5 in | Wt 201.0 lb

## 2017-12-07 DIAGNOSIS — R002 Palpitations: Secondary | ICD-10-CM | POA: Diagnosis not present

## 2017-12-07 DIAGNOSIS — E782 Mixed hyperlipidemia: Secondary | ICD-10-CM | POA: Diagnosis not present

## 2017-12-07 DIAGNOSIS — R0600 Dyspnea, unspecified: Secondary | ICD-10-CM

## 2017-12-07 DIAGNOSIS — R0609 Other forms of dyspnea: Secondary | ICD-10-CM

## 2017-12-07 DIAGNOSIS — I2510 Atherosclerotic heart disease of native coronary artery without angina pectoris: Secondary | ICD-10-CM | POA: Diagnosis not present

## 2017-12-07 DIAGNOSIS — I10 Essential (primary) hypertension: Secondary | ICD-10-CM | POA: Diagnosis not present

## 2017-12-07 NOTE — Patient Instructions (Signed)
Medication Instructions:  - Your physician recommends that you continue on your current medications as directed. Please refer to the Current Medication list given to you today.  If you need a refill on your cardiac medications before your next appointment, please call your pharmacy.   Lab work: - none ordered  If you have labs (blood work) drawn today and your tests are completely normal, you will receive your results only by: Marland Kitchen MyChart Message (if you have MyChart) OR . A paper copy in the mail If you have any lab test that is abnormal or we need to change your treatment, we will call you to review the results.  Testing/Procedures: - none ordered  Follow-Up: At [facility], you and your health needs are our priority.  As part of our continuing mission to provide you with exceptional heart care, we have created designated Provider Care Teams.  These Care Teams include your primary Cardiologist (physician) and Advanced Practice Providers (APPs -  Physician Assistants and Nurse Practitioners) who all work together to provide you with the care you need, when you need it. . 3 months with Dr. Rockey Situ  Any Other Special Instructions Will Be Listed Below (If Applicable). - N/A

## 2017-12-11 ENCOUNTER — Ambulatory Visit: Payer: PPO | Admitting: Cardiovascular Disease

## 2017-12-12 DIAGNOSIS — R0609 Other forms of dyspnea: Secondary | ICD-10-CM | POA: Diagnosis not present

## 2017-12-18 DIAGNOSIS — E785 Hyperlipidemia, unspecified: Secondary | ICD-10-CM | POA: Diagnosis not present

## 2017-12-18 DIAGNOSIS — Z794 Long term (current) use of insulin: Secondary | ICD-10-CM | POA: Diagnosis not present

## 2017-12-18 DIAGNOSIS — I10 Essential (primary) hypertension: Secondary | ICD-10-CM | POA: Diagnosis not present

## 2017-12-18 DIAGNOSIS — E1169 Type 2 diabetes mellitus with other specified complication: Secondary | ICD-10-CM | POA: Diagnosis not present

## 2017-12-18 DIAGNOSIS — E1142 Type 2 diabetes mellitus with diabetic polyneuropathy: Secondary | ICD-10-CM | POA: Diagnosis not present

## 2017-12-18 DIAGNOSIS — E1159 Type 2 diabetes mellitus with other circulatory complications: Secondary | ICD-10-CM | POA: Diagnosis not present

## 2017-12-24 ENCOUNTER — Ambulatory Visit: Payer: PPO

## 2018-03-04 DIAGNOSIS — E1159 Type 2 diabetes mellitus with other circulatory complications: Secondary | ICD-10-CM | POA: Diagnosis not present

## 2018-03-04 DIAGNOSIS — E669 Obesity, unspecified: Secondary | ICD-10-CM | POA: Diagnosis not present

## 2018-03-04 DIAGNOSIS — N401 Enlarged prostate with lower urinary tract symptoms: Secondary | ICD-10-CM | POA: Diagnosis not present

## 2018-03-04 DIAGNOSIS — E1142 Type 2 diabetes mellitus with diabetic polyneuropathy: Secondary | ICD-10-CM | POA: Diagnosis not present

## 2018-03-04 DIAGNOSIS — D638 Anemia in other chronic diseases classified elsewhere: Secondary | ICD-10-CM | POA: Diagnosis not present

## 2018-03-04 DIAGNOSIS — R351 Nocturia: Secondary | ICD-10-CM | POA: Diagnosis not present

## 2018-03-04 DIAGNOSIS — N183 Chronic kidney disease, stage 3 (moderate): Secondary | ICD-10-CM | POA: Diagnosis not present

## 2018-03-04 DIAGNOSIS — I10 Essential (primary) hypertension: Secondary | ICD-10-CM | POA: Diagnosis not present

## 2018-03-04 DIAGNOSIS — E785 Hyperlipidemia, unspecified: Secondary | ICD-10-CM | POA: Diagnosis not present

## 2018-03-04 DIAGNOSIS — Z794 Long term (current) use of insulin: Secondary | ICD-10-CM | POA: Diagnosis not present

## 2018-03-04 DIAGNOSIS — E1169 Type 2 diabetes mellitus with other specified complication: Secondary | ICD-10-CM | POA: Diagnosis not present

## 2018-03-06 DIAGNOSIS — L281 Prurigo nodularis: Secondary | ICD-10-CM | POA: Diagnosis not present

## 2018-03-06 DIAGNOSIS — L820 Inflamed seborrheic keratosis: Secondary | ICD-10-CM | POA: Diagnosis not present

## 2018-03-06 DIAGNOSIS — L570 Actinic keratosis: Secondary | ICD-10-CM | POA: Diagnosis not present

## 2018-03-06 DIAGNOSIS — Z08 Encounter for follow-up examination after completed treatment for malignant neoplasm: Secondary | ICD-10-CM | POA: Diagnosis not present

## 2018-03-06 DIAGNOSIS — E1169 Type 2 diabetes mellitus with other specified complication: Secondary | ICD-10-CM | POA: Diagnosis not present

## 2018-03-06 DIAGNOSIS — E1159 Type 2 diabetes mellitus with other circulatory complications: Secondary | ICD-10-CM | POA: Diagnosis not present

## 2018-03-06 DIAGNOSIS — E785 Hyperlipidemia, unspecified: Secondary | ICD-10-CM | POA: Diagnosis not present

## 2018-03-06 DIAGNOSIS — Z85828 Personal history of other malignant neoplasm of skin: Secondary | ICD-10-CM | POA: Diagnosis not present

## 2018-03-06 DIAGNOSIS — N183 Chronic kidney disease, stage 3 (moderate): Secondary | ICD-10-CM | POA: Diagnosis not present

## 2018-03-06 DIAGNOSIS — X32XXXA Exposure to sunlight, initial encounter: Secondary | ICD-10-CM | POA: Diagnosis not present

## 2018-03-06 DIAGNOSIS — D638 Anemia in other chronic diseases classified elsewhere: Secondary | ICD-10-CM | POA: Diagnosis not present

## 2018-03-06 DIAGNOSIS — I10 Essential (primary) hypertension: Secondary | ICD-10-CM | POA: Diagnosis not present

## 2018-03-06 DIAGNOSIS — L218 Other seborrheic dermatitis: Secondary | ICD-10-CM | POA: Diagnosis not present

## 2018-03-10 NOTE — Progress Notes (Signed)
Cardiology Office Note  Date:  03/11/2018   ID:  Ronald Wallace, Ronald Wallace 1940/09/02, MRN [id number]  PCP:  Ronald Body, MD   Chief Complaint  Patient presents with  . other    3 mo follow up. Medications reviewed vetbally.     HPI:  [name] is a 78 y/o Male with a prior h/o  syncope,  HTN,  HL,   DM, remote smoking, pipe, , no cigarettes  increasing dyspnea and underwent stress testing @ [facility], which was abnl,   CAD cath in late June 2017 revealing moderate LAD and LCX/OM dzs with a total occlusion of the prox RCA,  L R collaterals.  LV fxn was nl,  who presents for routine follow-up of his coronary artery disease,  chronic shortness of breath  In follow-up today he continues to have shortness of breath with walking No exercise Stopped the treadmill Used to go to the [facility], no longer goes to the gym  Previous cardiac catheterization results from November 2019 discussed with him Catheterization performed for shortness of breath  right and left heart cath   Right heart pressures were within normal range RA: mean 11, RV 39/10/15, PA: 31/16/22, Wedge: 11  Mild to moderate mid LAD disease, mild proximal and mid LCX disease, Occluded proximal RCA with left to right collaterals Normal right heart pressures  Prox LAD to Mid LAD lesion is 45% stenosed.  2nd Mrg lesion is 30% stenosed.  Ost Cx to Prox Cx lesion is 30% stenosed.  Prox RCA to Dist RCA lesion is 100% stenosed.    His hemoglobin A1c continues to run high, 8 Poor diet Sits around all day Previously felt better after he completed cardiac rehab, no exercise since that time, weight trending upwards  Denies any significant chest discomfort No episodes of confusion. Previously reported episode of confusion while driving Vision has stayed stable   Lab work reviewed with him toal chol 106, LDL 54 Tolerating statin  EKG personally reviewed by myself on todays visit showsNormal sinus  rhythm with rate 59 bpm no significant ST or T-wave changes  other past medical history reviewed  catheterization  June 2017 discussed with him, 30% LAD disease, occluded RCA with collaterals  02/2014 Carotid Dopplers: 1-39% bilateral ICA with normal Subclavian & vertebral arteries; b. 02/2014 2 D Echo: EF 50-55%, Gr 1 DD, Mild MR & LA dilation -- was bradycardic in 40s 02/2014 Neg Ex MV, EF 65%; b. 06/2015 Abnl MV (Ronald Wallace)-->Cath (Ronald Wallace): LM nl, LAD 30p/m, LCX 30p, OM2 50p, RCA 100p w/ L-->R collats, nl EF-->Med Rx.   PMH:   has a past medical history of Anemia, CAD (coronary artery disease), Diabetes mellitus, type II, insulin dependent ([place]), Diverticulitis, Essential hypertension, Hyperlipidemia, Neuropathy, Shortness of breath dyspnea, and Syncope and collapse.  PSH:    Past Surgical History:  Procedure Laterality Date  . CARDIAC CATHETERIZATION Left 07/07/2015   Procedure: Left Heart Cath and Coronary Angiography;  Surgeon: Ronald Cowman, MD;  Location: [place] [facility];  Service: Cardiovascular;  Laterality: Left;  . CARPAL TUNNEL RELEASE    . CATARACT EXTRACTION Right   . CATARACT EXTRACTION W/PHACO Left 10/06/2015   Procedure: CATARACT EXTRACTION PHACO AND INTRAOCULAR LENS PLACEMENT (IOC);  Surgeon: Ronald Cotta, MD;  Location: [facility];  Service: Ophthalmology;  Laterality: Left;  [place] 01:42 AP% 25.1 CDE 45.92 Fluid pack lot # [id number] H  . NM MYOVIEW LTD  03/13/2014   LOW RISK - positive EKG but negative imaging: 2  mm ST Depression in V4 & V5 but No scintigraphic evidence of Ischemia or Infarct o  . RIGHT/LEFT HEART CATH AND CORONARY ANGIOGRAPHY N/A 11/21/2017   Procedure: RIGHT/LEFT HEART CATH AND CORONARY ANGIOGRAPHY;  Surgeon: Ronald Merritts, MD;  Location: [place] [facility];  Service: Cardiovascular;  Laterality: N/A;  . TRANSTHORACIC ECHOCARDIOGRAM  03/12/2014   EF 50-55%, Gr 1 DD, no RWMA, Mlid MR & LA dil, Normla RV and bradycardic 40s -    Current  Outpatient Medications  Medication Sig Dispense Refill  . aspirin EC 81 MG tablet Take 81 mg by mouth daily.    . calcium carbonate (OS-CAL) 600 MG TABS tablet Take 600 mg by mouth daily with breakfast.    . cholecalciferol (VITAMIN D) 1000 UNITS tablet Take 2,000 Units by mouth daily.     Marland Kitchen gabapentin (NEURONTIN) 300 MG capsule Take 300-600 mg by mouth See admin instructions. Take 1 capsule (300 mg) by mouth in the morning & 2 capsules (600 mg) by mouth every night.    . hydrochlorothiazide (MICROZIDE) 12.5 MG capsule Take 12.5 mg by mouth daily with breakfast.    . isosorbide mononitrate (IMDUR) 30 MG 24 hr tablet TAKE 1 TABLET BY MOUTH ONCE DAILY (Patient taking differently: Take 15 mg by mouth every evening. ) 90 tablet 3  . LANTUS 100 UNIT/ML injection Inject 45 Units into the skin at bedtime. Take at bedtime    . losartan (COZAAR) 50 MG tablet Take 50 mg by mouth daily.     . metFORMIN (GLUCOPHAGE) 500 MG tablet Take 250 mg by mouth 2 (two) times daily with a meal.    . NOVOLOG 100 UNIT/ML injection Inject 20-38 Units into the skin 3 (three) times daily with meals. Sliding scale insulin base rates= 20 units in the morning, 26 units with lunch, 26 units with supper.    Marland Kitchen PARoxetine (PAXIL) 20 MG tablet Take 20 mg by mouth daily.    . simvastatin (ZOCOR) 20 MG tablet Take 20 mg by mouth daily.    . tamsulosin (FLOMAX) 0.4 MG CAPS capsule Take 0.4 mg by mouth daily. Takes 2 each morning.     No current facility-administered medications for this visit.      Allergies:   Amlodipine   Social History:  The patient  reports that he has never smoked. He has never used smokeless tobacco. He reports that he does not drink alcohol or use drugs.   Family History:   family history includes Heart attack in his mother.    Review of Systems: Review of Systems  Constitutional: Negative.   Respiratory: Positive for shortness of breath.   Cardiovascular: Negative.   Gastrointestinal: Negative.     Musculoskeletal: Negative.   Neurological: Negative.   Psychiatric/Behavioral: Negative.   All other systems reviewed and are negative.   PHYSICAL EXAM: VS:  BP 130/60 (BP Location: Left Arm, Patient Position: Sitting, Cuff Size: Normal)   Ht 5\' 6"  (1.676 m)   Wt 204 lb (92.5 kg)   BMI 32.93 kg/m  , BMI Wallace mass index is 32.93 kg/m.  Constitutional:  oriented to person, place, and time. No distress.  HENT:  Head: Grossly normal Eyes:  no discharge. No scleral icterus.  Neck: No JVD, no carotid bruits  Cardiovascular: Regular rate and rhythm, no murmurs appreciated Pulmonary/Chest: Clear to auscultation bilaterally, no wheezes or rails Abdominal: Soft.  no distension.  no tenderness.  Musculoskeletal: Normal range of motion Neurological:  normal muscle tone. Coordination normal.  No atrophy Skin: Skin warm and dry Psychiatric: normal affect, pleasant  Recent Labs: 11/20/2017: BUN 24; Creatinine, Ser 1.39; Hemoglobin 11.4; Platelets 152; Potassium 4.2; Sodium 138    Lipid Panel No results found for: CHOL, HDL, LDLCALC, TRIG    Wt Readings from Last 3 Encounters:  03/11/18 204 lb (92.5 kg)  12/07/17 201 lb (91.2 kg)  11/21/17 198 lb (89.8 kg)      ASSESSMENT AND PLAN:  Shortness of breath Deconditioning, weight elevated catheterization November 2019 showing no significant change in his disease RCA occluded with collaterals from left to right Recommended regular walking program, restrict his diet  Essential hypertension - Plan: EKG 12-Lead Blood pressure stable, no medication changes made  Mixed hyperlipidemia - Plan: EKG 12-Lead Cholesterol at goal, no changes made  Coronary artery disease of native artery of native heart with stable angina pectoris ([place]) - Plan: EKG 12-Lead Occluded RCA with collaterals from left to right by catheterization June 2017 Same findings on catheterization November 2017 Stressed the importance of aggressive lipid and diabetes  control  Diabetes mellitus, type II, insulin dependent ([place]) - Plan: EKG 12-Lead Very sedentary, poor diet Discussed with his wife who helps by the food Recommended 10 pound weight loss  DOE (dyspnea on exertion) -  Long discussion on previous visits for his shortness of breath Recommended weight loss, better diet, exercise program They own a treadmill and he is not using it  Chronic renal impairment, stage 3 (moderate) - Plan: EKG 12-Lead Lasix as needed for leg swelling   Total encounter time more than 25 minutes  Greater than 50% was spent in counseling and coordination of care with the patient  Disposition:   F/U  12 months   Orders Placed This Encounter  Procedures  . EKG 12-Lead     Signed, Esmond Plants, M.D., Ph.D. 03/11/2018  [place], [place]

## 2018-03-11 ENCOUNTER — Ambulatory Visit: Payer: PPO | Admitting: Cardiovascular Disease

## 2018-03-11 ENCOUNTER — Encounter: Payer: Self-pay | Admitting: Cardiovascular Disease

## 2018-03-11 VITALS — BP 130/60 | Ht 66.0 in | Wt 204.0 lb

## 2018-03-11 DIAGNOSIS — Z794 Long term (current) use of insulin: Secondary | ICD-10-CM | POA: Diagnosis not present

## 2018-03-11 DIAGNOSIS — I25118 Atherosclerotic heart disease of native coronary artery with other forms of angina pectoris: Secondary | ICD-10-CM

## 2018-03-11 DIAGNOSIS — I10 Essential (primary) hypertension: Secondary | ICD-10-CM | POA: Diagnosis not present

## 2018-03-11 DIAGNOSIS — N183 Chronic kidney disease, stage 3 unspecified: Secondary | ICD-10-CM

## 2018-03-11 DIAGNOSIS — R0600 Dyspnea, unspecified: Secondary | ICD-10-CM

## 2018-03-11 DIAGNOSIS — E119 Type 2 diabetes mellitus without complications: Secondary | ICD-10-CM

## 2018-03-11 DIAGNOSIS — R0609 Other forms of dyspnea: Secondary | ICD-10-CM

## 2018-03-11 DIAGNOSIS — R002 Palpitations: Secondary | ICD-10-CM

## 2018-03-11 DIAGNOSIS — E782 Mixed hyperlipidemia: Secondary | ICD-10-CM | POA: Diagnosis not present

## 2018-03-11 NOTE — Patient Instructions (Signed)

## 2018-03-27 DIAGNOSIS — E785 Hyperlipidemia, unspecified: Secondary | ICD-10-CM | POA: Diagnosis not present

## 2018-03-27 DIAGNOSIS — E1169 Type 2 diabetes mellitus with other specified complication: Secondary | ICD-10-CM | POA: Diagnosis not present

## 2018-03-27 DIAGNOSIS — Z794 Long term (current) use of insulin: Secondary | ICD-10-CM | POA: Diagnosis not present

## 2018-03-27 DIAGNOSIS — E1159 Type 2 diabetes mellitus with other circulatory complications: Secondary | ICD-10-CM | POA: Diagnosis not present

## 2018-03-27 DIAGNOSIS — I10 Essential (primary) hypertension: Secondary | ICD-10-CM | POA: Diagnosis not present

## 2018-03-27 DIAGNOSIS — E1142 Type 2 diabetes mellitus with diabetic polyneuropathy: Secondary | ICD-10-CM | POA: Diagnosis not present

## 2018-05-29 ENCOUNTER — Other Ambulatory Visit: Payer: Self-pay | Admitting: Pharmacy Technician

## 2018-05-29 NOTE — Patient Outreach (Signed)
[facility]) Care Management  05/29/2018  Ronald Wallace 1940/06/01 [id number]   Incoming call from patients wife, Ronald Wallace, requesting assistance with patient assistance for Novolog and Lantus Solostar.  Ronald Wallace confirms that Ronald Wallace is being followed by [facility] thru his insurance. I informed her that [facility] would need to initiate patient assistance for him. Encouraged her to outreach [facility] RN that visited him recently and I will send an email to [facility] as well to inform them of patient need.  Ronald Wallace [place] Certified Pharmacy Technician [place] Management Direct Dial:[phone]

## 2018-06-05 ENCOUNTER — Other Ambulatory Visit: Payer: Self-pay | Admitting: Pharmacy Technician

## 2018-06-05 NOTE — Patient Outreach (Signed)
[facility]) Care Management  06/05/2018  Ronald Wallace 06/29/1940 [id number]   Incoming call from patient wife, [name] stating that NP with [facility] informed them that she does not do patient assistance.  Informed [name] that I will start the patient assistance process for his Novolog [employer]) and [employer] (Lantus) and will fax provider portions to Dr. Honor Junes.  Will follow up with patient in 7-10 business days to confirm applications have been received.  [name] [name] [place] Certified Pharmacy Technician [place] Management Direct Dial:[phone]

## 2018-06-07 ENCOUNTER — Other Ambulatory Visit: Payer: Self-pay | Admitting: Pharmacist

## 2018-06-07 NOTE — Patient Outreach (Signed)
[facility]) Care Management  [place] 06/07/2018  Ronald Wallace Nov 26, 1940 [id number]  Medication assistance self-referral from patient for Novolog and Lantus.  [facility] assisted patient with successful patient assistance program applications for these medications in 2019.    Per review of current medications, Novolog and Lantus are active on medication list and remain appropriate for patient assistance program applications.    Plan: [facility] pharmacy technician will assist patient with application process for 2020 for Novolog and Lantus.  [facility] pharmacy technician will assist with obtaining all required documents from both patient and provider(s) and submit application(s) once completed.    Ralene Bathe, PharmD, [place] [phone]

## 2018-06-20 ENCOUNTER — Other Ambulatory Visit: Payer: Self-pay | Admitting: Pharmacy Technician

## 2018-06-20 NOTE — Patient Outreach (Signed)
[facility]) Care Management  06/20/2018  Ronald Wallace 03-08-1940 [id number]   Received patient portion(s) of patient assistance application for Lantus Solostar and Novolog pens. Faxed completed application and required documents into [employer] and [name].  Will follow up with company in 2-3 business days to check status of application.  [name] [name] [place] Certified Pharmacy Technician [place] Management Direct [phone]

## 2018-06-26 ENCOUNTER — Other Ambulatory Visit: Payer: Self-pay | Admitting: Pharmacy Technician

## 2018-06-26 NOTE — Patient Outreach (Signed)
[place] [facility]) Care Management  06/26/2018  Ronald Wallace 1940-03-29 [id number]    Follow up call placed to [name] regarding patient assistance application(s) for Novolog Flextouch , [name] confimrs patient has been approved as of 6/5 until 12/16/18. Medication to arrive at providers office in 10-14 business days.   Follow up call placed to [employer] regarding patient assistance application(s) for Lantus Solostar , [name] states that patient application is still under review.  Follow up:  Will follow up with [employer] in 2-3 business days.  Unsuccessful call placed to patients wife to update.  [name] [name] [name] Certified Pharmacy Technician [place] Management Direct Dial:[phone]

## 2018-06-28 ENCOUNTER — Other Ambulatory Visit: Payer: Self-pay | Admitting: Pharmacy Technician

## 2018-06-28 NOTE — Patient Outreach (Signed)
[place] [facility]) Care Management  06/28/2018  Ronald Wallace [birth year] [id number]    Follow up call placed to [place] regarding patient assistance application(s) for Lantus Solostar , [name] states that patient has not met OOP spend requirement based on income verification company ran.    Unsuccessful call #1 placed to patients wife regarding patient assistance update for Lantus, HIPAA compliant voicemail left. Contacted [name] to verify all sources of income.  Will make 2nd call attempt in 2-3 business days if call has not been returned.  [name] [name] [place] Certified Pharmacy Technician [place] Management Direct Dial:[phone]

## 2018-07-02 DIAGNOSIS — E1169 Type 2 diabetes mellitus with other specified complication: Secondary | ICD-10-CM | POA: Diagnosis not present

## 2018-07-02 DIAGNOSIS — I10 Essential (primary) hypertension: Secondary | ICD-10-CM | POA: Diagnosis not present

## 2018-07-02 DIAGNOSIS — I2510 Atherosclerotic heart disease of native coronary artery without angina pectoris: Secondary | ICD-10-CM | POA: Diagnosis not present

## 2018-07-02 DIAGNOSIS — E1159 Type 2 diabetes mellitus with other circulatory complications: Secondary | ICD-10-CM | POA: Diagnosis not present

## 2018-07-02 DIAGNOSIS — D638 Anemia in other chronic diseases classified elsewhere: Secondary | ICD-10-CM | POA: Diagnosis not present

## 2018-07-02 DIAGNOSIS — E1142 Type 2 diabetes mellitus with diabetic polyneuropathy: Secondary | ICD-10-CM | POA: Diagnosis not present

## 2018-07-02 DIAGNOSIS — N183 Chronic kidney disease, stage 3 (moderate): Secondary | ICD-10-CM | POA: Diagnosis not present

## 2018-07-02 DIAGNOSIS — Z794 Long term (current) use of insulin: Secondary | ICD-10-CM | POA: Diagnosis not present

## 2018-07-02 DIAGNOSIS — M5442 Lumbago with sciatica, left side: Secondary | ICD-10-CM | POA: Diagnosis not present

## 2018-07-02 DIAGNOSIS — E785 Hyperlipidemia, unspecified: Secondary | ICD-10-CM | POA: Diagnosis not present

## 2018-07-03 DIAGNOSIS — M545 Low back pain: Secondary | ICD-10-CM | POA: Diagnosis not present

## 2018-07-08 DIAGNOSIS — E785 Hyperlipidemia, unspecified: Secondary | ICD-10-CM | POA: Diagnosis not present

## 2018-07-08 DIAGNOSIS — E1159 Type 2 diabetes mellitus with other circulatory complications: Secondary | ICD-10-CM | POA: Diagnosis not present

## 2018-07-08 DIAGNOSIS — N183 Chronic kidney disease, stage 3 (moderate): Secondary | ICD-10-CM | POA: Diagnosis not present

## 2018-07-08 DIAGNOSIS — I10 Essential (primary) hypertension: Secondary | ICD-10-CM | POA: Diagnosis not present

## 2018-07-08 DIAGNOSIS — S76302A Unspecified injury of muscle, fascia and tendon of the posterior muscle group at thigh level, left thigh, initial encounter: Secondary | ICD-10-CM | POA: Diagnosis not present

## 2018-07-08 DIAGNOSIS — D638 Anemia in other chronic diseases classified elsewhere: Secondary | ICD-10-CM | POA: Diagnosis not present

## 2018-07-08 DIAGNOSIS — E1169 Type 2 diabetes mellitus with other specified complication: Secondary | ICD-10-CM | POA: Diagnosis not present

## 2018-07-08 DIAGNOSIS — E669 Obesity, unspecified: Secondary | ICD-10-CM | POA: Diagnosis not present

## 2018-07-08 DIAGNOSIS — Z0000 Encounter for general adult medical examination without abnormal findings: Secondary | ICD-10-CM | POA: Diagnosis not present

## 2018-07-09 ENCOUNTER — Telehealth: Payer: Self-pay | Admitting: Cardiovascular Disease

## 2018-07-09 ENCOUNTER — Other Ambulatory Visit: Payer: Self-pay | Admitting: Family Medicine

## 2018-07-09 NOTE — Telephone Encounter (Signed)
Patient spouse calling  Would like to go back over information that was mentioned on last office visit with Dr Rockey Situ Wants to clarify some information received from another office before proceeding Please call to discuss

## 2018-07-09 NOTE — Telephone Encounter (Addendum)
Spoke with the pt wife. Pt wife sts that the pt was recently seen by a PA @ his pcp office for back pain. An xray was ordered and they were told that the pt will need to have a CT to f/u on a incidental finding of "something with the pt aorta." pt wife sts that they didn't quite understand what they were being told and wanted to get Dr. Donivan Scull recommendation before proceeding with the test.  Adv the pt wife that based on our conversation that there may have been a dilation or enlargement of the aorta on the xray and that a CT is being ordered to further evaluate. Adv her that the testing recommendation is reasonable.  Pt and his wife would like Dr. Donivan Scull recommendation. Pt pcp is [facility] affiliated and the xray report should be viewable in care everywhere. Adv the pt wife that I will fwd the message to Dr. Rockey Situ and we will call back with his response. Mrs. Pharo voiced appreciation.

## 2018-07-11 ENCOUNTER — Other Ambulatory Visit: Payer: Self-pay | Admitting: Family Medicine

## 2018-07-11 DIAGNOSIS — I700 Atherosclerosis of aorta: Secondary | ICD-10-CM

## 2018-07-11 NOTE — Telephone Encounter (Signed)
I am unable to see results of xray in care everywhere. Only that test was done I see an aorta u/s was ordered for "calcification of aorta" I looked at CT ABD/pelvis 2016 and there was mild aorta atherosclerosis at that time.  No aorta dilation/aneurysm. I do not think things would have changed much since that time, But certainly his choice, could have u/s if he would like an update after 4 years

## 2018-07-11 NOTE — Telephone Encounter (Signed)
Patient's wife notified of Dr Donivan Scull recommendations. She will let patient know and have patient proceed with the procedure. She was appreciative.

## 2018-07-18 ENCOUNTER — Other Ambulatory Visit: Payer: Self-pay

## 2018-07-18 ENCOUNTER — Ambulatory Visit
Admission: RE | Admit: 2018-07-18 | Discharge: 2018-07-18 | Disposition: A | Discharge status: Home / Self Care | Payer: PPO | Source: Ambulatory Visit | Attending: Family Medicine | Admitting: Family Medicine

## 2018-07-18 DIAGNOSIS — I700 Atherosclerosis of aorta: Secondary | ICD-10-CM | POA: Insufficient documentation

## 2018-07-22 ENCOUNTER — Other Ambulatory Visit: Payer: Self-pay | Admitting: Family Medicine

## 2018-07-22 ENCOUNTER — Other Ambulatory Visit: Payer: Self-pay

## 2018-07-22 ENCOUNTER — Other Ambulatory Visit: Payer: Self-pay | Admitting: Cardiovascular Disease

## 2018-07-22 ENCOUNTER — Ambulatory Visit
Admission: RE | Admit: 2018-07-22 | Discharge: 2018-07-22 | Disposition: A | Discharge status: Home / Self Care | Payer: PPO | Source: Ambulatory Visit | Attending: Family Medicine | Admitting: Family Medicine

## 2018-07-22 DIAGNOSIS — M7989 Other specified soft tissue disorders: Secondary | ICD-10-CM | POA: Insufficient documentation

## 2018-07-22 DIAGNOSIS — M79605 Pain in left leg: Secondary | ICD-10-CM | POA: Diagnosis not present

## 2018-07-22 DIAGNOSIS — M79662 Pain in left lower leg: Secondary | ICD-10-CM

## 2018-07-26 DIAGNOSIS — M47816 Spondylosis without myelopathy or radiculopathy, lumbar region: Secondary | ICD-10-CM | POA: Diagnosis not present

## 2018-07-26 DIAGNOSIS — G8929 Other chronic pain: Secondary | ICD-10-CM | POA: Diagnosis not present

## 2018-07-26 DIAGNOSIS — M62838 Other muscle spasm: Secondary | ICD-10-CM | POA: Diagnosis not present

## 2018-07-26 DIAGNOSIS — M25552 Pain in left hip: Secondary | ICD-10-CM | POA: Diagnosis not present

## 2018-07-26 DIAGNOSIS — M5442 Lumbago with sciatica, left side: Secondary | ICD-10-CM | POA: Diagnosis not present

## 2018-07-29 ENCOUNTER — Other Ambulatory Visit: Payer: Self-pay | Admitting: Pharmacy Technician

## 2018-07-29 ENCOUNTER — Other Ambulatory Visit: Payer: Self-pay | Admitting: Pharmacist

## 2018-07-29 ENCOUNTER — Other Ambulatory Visit: Payer: Self-pay | Admitting: Sports Medicine

## 2018-07-29 DIAGNOSIS — G8929 Other chronic pain: Secondary | ICD-10-CM

## 2018-07-29 DIAGNOSIS — M5432 Sciatica, left side: Secondary | ICD-10-CM

## 2018-07-29 NOTE — Patient Outreach (Signed)
[place] [facility]) Care Management  [place] 07/29/2018  Ronald Wallace 11/22/1940 [id number]  Reason for call: notification received from [place] technician that patient successful approved for Novolog however was denied for Lantus as he has not spent enough out-of-pocket on co-pays to qualify yet.    Successful call with [name].  We discussed that there are possible substitutions for Lantus that we can assist with applying for that do NOT have the out-of-pocket expenditure requirement.  Levemir is made by [name] which patient already approved for with Novolog.  Another option is Engineer, agricultural through [employer].  Spouse wrote down names of insulins and will discuss with Dr. Manfred Shirts at appt on Wednesday.    Plan: Will f/u with patient and spouse on Thursday morning  Ralene Bathe, PharmD, [place] [phone]

## 2018-07-29 NOTE — Patient Outreach (Addendum)
[place] [facility]) Care Management  07/29/2018  Ronald Wallace 03-04-1940 [id number]    Successful call placed to Ronald Wallace, however she stated she would need to call me right back.  Will make follow up call in 3-5 business days if call has not been returned.  [name] [name] [place] Certified Pharmacy Technician [place] Management Direct Dial:[phone]   ADDENDUM 12:05pm  Incoming call from Ronald Wallace, HIPAA identifiers verified. Informed Ronald Wallace that [name] will need to spend an additional $500 OOP on medication in order to qualify for his Lantus Solostar. Requested that she contact me when or if he spends that amount.  Reviewed with Ronald Wallace how to obtain refills for [name]' Novolog thru [name].  Will route note to [place] for case closure.  [name] [name] [place] Certified Pharmacy Technician [place] Management Direct Dial:[phone]

## 2018-07-30 DIAGNOSIS — D638 Anemia in other chronic diseases classified elsewhere: Secondary | ICD-10-CM | POA: Diagnosis not present

## 2018-07-30 DIAGNOSIS — E1169 Type 2 diabetes mellitus with other specified complication: Secondary | ICD-10-CM | POA: Diagnosis not present

## 2018-07-30 DIAGNOSIS — E1159 Type 2 diabetes mellitus with other circulatory complications: Secondary | ICD-10-CM | POA: Diagnosis not present

## 2018-07-30 DIAGNOSIS — I10 Essential (primary) hypertension: Secondary | ICD-10-CM | POA: Diagnosis not present

## 2018-07-30 DIAGNOSIS — N183 Chronic kidney disease, stage 3 (moderate): Secondary | ICD-10-CM | POA: Diagnosis not present

## 2018-07-30 DIAGNOSIS — E785 Hyperlipidemia, unspecified: Secondary | ICD-10-CM | POA: Diagnosis not present

## 2018-07-30 DIAGNOSIS — E669 Obesity, unspecified: Secondary | ICD-10-CM | POA: Diagnosis not present

## 2018-07-31 DIAGNOSIS — D509 Iron deficiency anemia, unspecified: Secondary | ICD-10-CM | POA: Diagnosis not present

## 2018-07-31 DIAGNOSIS — E785 Hyperlipidemia, unspecified: Secondary | ICD-10-CM | POA: Diagnosis not present

## 2018-07-31 DIAGNOSIS — Z794 Long term (current) use of insulin: Secondary | ICD-10-CM | POA: Diagnosis not present

## 2018-07-31 DIAGNOSIS — E1169 Type 2 diabetes mellitus with other specified complication: Secondary | ICD-10-CM | POA: Diagnosis not present

## 2018-07-31 DIAGNOSIS — Z86010 Personal history of colonic polyps: Secondary | ICD-10-CM | POA: Diagnosis not present

## 2018-07-31 DIAGNOSIS — E1142 Type 2 diabetes mellitus with diabetic polyneuropathy: Secondary | ICD-10-CM | POA: Diagnosis not present

## 2018-07-31 DIAGNOSIS — I10 Essential (primary) hypertension: Secondary | ICD-10-CM | POA: Diagnosis not present

## 2018-07-31 DIAGNOSIS — E1159 Type 2 diabetes mellitus with other circulatory complications: Secondary | ICD-10-CM | POA: Diagnosis not present

## 2018-08-01 ENCOUNTER — Ambulatory Visit: Payer: Self-pay | Admitting: Pharmacist

## 2018-08-01 ENCOUNTER — Other Ambulatory Visit: Payer: Self-pay | Admitting: Pharmacist

## 2018-08-01 NOTE — Patient Outreach (Signed)
[place] [facility]) Care Management  [place] 08/01/2018  DENT PLANTZ 12/24/1940 [id number]  Reason for call: Per review of [facility] notes, endocrinologist ok with long-acting insulin substitution.  Patient unfortunately only suggested Basaglar, not Levemir.    Successful call with Ms. Rudden.  She reports she forgot the name of Levemir but will call the office today to clarify that Levemir is the medication to substitute for Lantus as it is made by [name], same as Novolog, and therefore easily approved for patient assistance.  She was also able to verbalize medication changes and insulin reduction from o/v.   Plan: Will have [place] fax provider forms for Levemir patient assistance program and f/u with patient per workflow.  Ralene Bathe, PharmD, [place] [phone]

## 2018-08-06 ENCOUNTER — Other Ambulatory Visit: Payer: Self-pay

## 2018-08-06 ENCOUNTER — Ambulatory Visit
Admission: RE | Admit: 2018-08-06 | Discharge: 2018-08-06 | Disposition: A | Discharge status: Home / Self Care | Payer: PPO | Source: Ambulatory Visit | Attending: Sports Medicine | Admitting: Sports Medicine

## 2018-08-06 DIAGNOSIS — M5442 Lumbago with sciatica, left side: Secondary | ICD-10-CM | POA: Diagnosis not present

## 2018-08-06 DIAGNOSIS — M47816 Spondylosis without myelopathy or radiculopathy, lumbar region: Secondary | ICD-10-CM | POA: Diagnosis not present

## 2018-08-06 DIAGNOSIS — G8929 Other chronic pain: Secondary | ICD-10-CM | POA: Diagnosis not present

## 2018-08-06 DIAGNOSIS — M5136 Other intervertebral disc degeneration, lumbar region: Secondary | ICD-10-CM | POA: Diagnosis not present

## 2018-08-06 DIAGNOSIS — M5432 Sciatica, left side: Secondary | ICD-10-CM | POA: Insufficient documentation

## 2018-08-07 ENCOUNTER — Telehealth: Payer: Self-pay | Admitting: Cardiovascular Disease

## 2018-08-07 NOTE — Telephone Encounter (Signed)
Dr. Rockey Situ   Can you comment on this patient holding his ASA for colonoscopy? He has a hx of CAD , DMII- insulin dep., HTN, HLD, neuropathy, and syncope. Last cath (secondary to [place]) 11/2017 with stable but moderate LAD/LCX disease with CTO of RCA and L>R collaterals. DOE thought to be in relation to deconditioning with recommendations for regular exercise.   He is scheduled for endoscopy procedure.  Please route your response back to pre-op pool.   Thank you  Sharee Pimple

## 2018-08-07 NOTE — Telephone Encounter (Signed)
° °  [facility] Pre-operative Risk Assessment    Request for surgical clearance:  1. What type of surgery is being performed? Colonoscopy  2. When is this surgery scheduled? 12/04/2018  3. What type of clearance is required (medical clearance vs. Pharmacy clearance to hold med vs. Both)?   4. Are there any medications that need to be held prior to surgery and how long? If patient takes any anticoagulant, advise on how long to hold  5. Practice name and name of physician performing surgery? [facility]  [facility]   6. What is your office phone number [phone]   7.   What is your office fax number [phone]  8.   Anesthesia type (None, local, MAC, general) not noted   Marykay Lex 08/07/2018, 8:25 AM  _________________________________________________________________   (provider comments below)

## 2018-08-08 NOTE — Telephone Encounter (Signed)
He should not have to hold aspirin for colonoscopy, would stay on it Does not appear he is on any other medications such as Plavix

## 2018-08-08 NOTE — Telephone Encounter (Signed)
   Primary Cardiologist: Ida Rogue, MD  Chart reviewed as part of pre-operative protocol coverage. Patient was contacted 08/08/2018 in reference to pre-operative risk assessment for pending surgery as outlined below.  Ronald Wallace was last seen on 03/11/2018 by Dr. Rockey Situ.  Ronald Wallace continued to have mild shortness of breath with exertion however was thought to be chronic and in the setting of obesity and deconditioning.  Per Dr. Rockey Situ, patient should not have to hold aspirin therapy for colonoscopy therefore, would recommend no interruption. He is currently not on any anticoagulation nor Plavix.  Therefore, based on ACC/AHA guidelines, the patient would be at acceptable risk for the planned procedure without further cardiovascular testing.   I will route this recommendation to the requesting party via Epic fax function and remove from pre-op pool.  Please call with questions.  Kathyrn Drown, NP 08/08/2018, 3:21 PM

## 2018-08-13 DIAGNOSIS — Z6830 Body mass index (BMI) 30.0-30.9, adult: Secondary | ICD-10-CM | POA: Diagnosis not present

## 2018-08-13 DIAGNOSIS — M5442 Lumbago with sciatica, left side: Secondary | ICD-10-CM | POA: Diagnosis not present

## 2018-08-13 DIAGNOSIS — G8929 Other chronic pain: Secondary | ICD-10-CM | POA: Diagnosis not present

## 2018-08-16 ENCOUNTER — Other Ambulatory Visit: Payer: Self-pay | Admitting: Pharmacy Technician

## 2018-08-16 NOTE — Patient Outreach (Signed)
[place] [facility]) Care Management  08/16/2018  OBERON HEHIR [birth year] [id number]   08/13/2018  -Received Novo Nordisk patient assistance fax from Dr. Melynda Ripple office for [place] providers office and left message with receptionist stating that document had been received but directions for the medication had not been included. Requested someone to call me back or refax documents back in.   08/16/2018 @ 12:59pm  -Contacted providers office, Nate states that my call had been returned and that a voicemail has been left. Informed Nate that I had not received a voicemail from anyone. Requested a callback or for the document to be faxed into me with the Levemir directions.  Successful call made to Mrs. Bielinski, HIPAA identifiers verified. Informed Mrs. Priebe that once directions for medication has been received I will fax document into [name].   [name] [name] [place] Certified Pharmacy Technician [place] Management Direct Dial:[phone]

## 2018-08-19 ENCOUNTER — Other Ambulatory Visit: Payer: Self-pay | Admitting: Pharmacy Technician

## 2018-08-19 NOTE — Patient Outreach (Signed)
[place] [facility]) Care Management  08/19/2018  OTT ZIMMERLE 07-03-1940 [id number]   Return call from [facility] @ Dr. Sherren Mocha office. Chelsea confirms that Dr. Honor Junes approves the same directions on Levemir application that was written on denied application for Lantus Solostar thru [employer] which is 45 units every night.  Faxed completed application into [name] , will follow up with company in 5-7 business days  [employer]. [name] [place] Certified Pharmacy Technician [place] Management Direct [phone]

## 2018-08-22 DIAGNOSIS — M5442 Lumbago with sciatica, left side: Secondary | ICD-10-CM | POA: Diagnosis not present

## 2018-08-26 DIAGNOSIS — M5442 Lumbago with sciatica, left side: Secondary | ICD-10-CM | POA: Diagnosis not present

## 2018-08-28 ENCOUNTER — Other Ambulatory Visit: Payer: Self-pay | Admitting: Pharmacy Technician

## 2018-08-28 DIAGNOSIS — M5442 Lumbago with sciatica, left side: Secondary | ICD-10-CM | POA: Diagnosis not present

## 2018-08-28 NOTE — Patient Outreach (Signed)
[place] [facility] Care Management  08/28/2018  BEDFORD WINSOR 04-05-1940 [id number]    Follow up call placed to [place] regarding patient assistance application(s) for Levemir , Mardene Celeste confirms patient has been approved as of 8/4. Mardene Celeste states the medication had not been shipped out due to whomever process application not realizing the drug is being added on to program vs a refill from previous approval of Novolog. She submitted order and medication to arrive at providers office in 10-14 business days.    Successful call placed to patients wife regarding patient assistance update for Levemir, HIPAA identifiers verified. Informed Mrs. Duran of above details.  Follow up:  Will follow up with patient in 10-14 business days to confirm medication has been received.  [name] [name] [place] Certified Pharmacy Technician [place] Management Direct Dial:[phone]

## 2018-09-02 DIAGNOSIS — M5442 Lumbago with sciatica, left side: Secondary | ICD-10-CM | POA: Diagnosis not present

## 2018-09-04 DIAGNOSIS — M5442 Lumbago with sciatica, left side: Secondary | ICD-10-CM | POA: Diagnosis not present

## 2018-09-10 DIAGNOSIS — M5442 Lumbago with sciatica, left side: Secondary | ICD-10-CM | POA: Diagnosis not present

## 2018-09-10 DIAGNOSIS — M2560 Stiffness of unspecified joint, not elsewhere classified: Secondary | ICD-10-CM | POA: Diagnosis not present

## 2018-09-12 DIAGNOSIS — M5442 Lumbago with sciatica, left side: Secondary | ICD-10-CM | POA: Diagnosis not present

## 2018-09-17 DIAGNOSIS — M5442 Lumbago with sciatica, left side: Secondary | ICD-10-CM | POA: Diagnosis not present

## 2018-09-20 ENCOUNTER — Other Ambulatory Visit: Payer: Self-pay | Admitting: Pharmacist

## 2018-09-20 ENCOUNTER — Other Ambulatory Visit: Payer: Self-pay | Admitting: Pharmacy Technician

## 2018-09-20 DIAGNOSIS — M5442 Lumbago with sciatica, left side: Secondary | ICD-10-CM | POA: Diagnosis not present

## 2018-09-20 NOTE — Patient Outreach (Signed)
[facility]) Care Management  09/20/2018  Ronald Wallace 10-Oct-1940 [id number]    Successful call placed to patients wife regarding patient assistance medication delivery of Levemir, HIPAA identifiers verified. [name] confirms that they received the Levemir from [name]. Reviewed with her on obtaining refills and requested she contact me with any issues.  Follow up:  Will route note to [place] for case closure  [name]. [name] [place] Certified Pharmacy Technician [place] Management Direct Dial:[phone]

## 2018-09-20 NOTE — Patient Outreach (Signed)
[place] [facility]) Care Management [place]  09/20/2018  Ronald Wallace [birth year] [id number]  Patient has been approved for Levemir and Novolog patient assistance program(s).  Patient has been instructed on how to order refills and renewal process for 2020.    Plan: [place] case is being closed due to the following reasons: -Goals of care have been met. -I have provided my contact information if patient or family needs to reach out to me in the future.  -Thank you for allowing [facility] pharmacy to be involved in this patient's care.    Ralene Bathe, PharmD, [place] [phone]

## 2018-09-25 DIAGNOSIS — M5442 Lumbago with sciatica, left side: Secondary | ICD-10-CM | POA: Diagnosis not present

## 2018-09-27 DIAGNOSIS — M5442 Lumbago with sciatica, left side: Secondary | ICD-10-CM | POA: Diagnosis not present

## 2018-10-24 DIAGNOSIS — E11319 Type 2 diabetes mellitus with unspecified diabetic retinopathy without macular edema: Secondary | ICD-10-CM | POA: Diagnosis not present

## 2018-10-28 DIAGNOSIS — E113299 Type 2 diabetes mellitus with mild nonproliferative diabetic retinopathy without macular edema, unspecified eye: Secondary | ICD-10-CM | POA: Insufficient documentation

## 2018-10-28 HISTORY — DX: Type 2 diabetes mellitus with mild nonproliferative diabetic retinopathy without macular edema, unspecified eye: E11.3299

## 2018-10-29 ENCOUNTER — Other Ambulatory Visit: Payer: Self-pay | Admitting: Pharmacy Technician

## 2018-10-29 NOTE — Patient Outreach (Signed)
[place] [facility]) Care Management  10/29/2018  KRIKOR REECK [birth year] [id number]   Incoming call from patient's wife stating that patient is needing refills on Novolog and Levemir from [name] patient assistance.  Faxed copy of Novo Nordisk refill request form to Dr. Melynda Ripple office requesting it to be completed and faxed into [name] @ [phone].  [name] [name] [place] Certified Pharmacy Technician [place] Management Direct Dial:[phone]

## 2018-10-31 DIAGNOSIS — E785 Hyperlipidemia, unspecified: Secondary | ICD-10-CM | POA: Diagnosis not present

## 2018-10-31 DIAGNOSIS — N183 Chronic kidney disease, stage 3 unspecified: Secondary | ICD-10-CM | POA: Diagnosis not present

## 2018-10-31 DIAGNOSIS — E1169 Type 2 diabetes mellitus with other specified complication: Secondary | ICD-10-CM | POA: Diagnosis not present

## 2018-10-31 DIAGNOSIS — D638 Anemia in other chronic diseases classified elsewhere: Secondary | ICD-10-CM | POA: Diagnosis not present

## 2018-10-31 DIAGNOSIS — I10 Essential (primary) hypertension: Secondary | ICD-10-CM | POA: Diagnosis not present

## 2018-10-31 DIAGNOSIS — E1159 Type 2 diabetes mellitus with other circulatory complications: Secondary | ICD-10-CM | POA: Diagnosis not present

## 2018-11-07 DIAGNOSIS — E669 Obesity, unspecified: Secondary | ICD-10-CM | POA: Diagnosis not present

## 2018-11-07 DIAGNOSIS — E785 Hyperlipidemia, unspecified: Secondary | ICD-10-CM | POA: Diagnosis not present

## 2018-11-07 DIAGNOSIS — E1159 Type 2 diabetes mellitus with other circulatory complications: Secondary | ICD-10-CM | POA: Diagnosis not present

## 2018-11-07 DIAGNOSIS — Z0000 Encounter for general adult medical examination without abnormal findings: Secondary | ICD-10-CM | POA: Diagnosis not present

## 2018-11-07 DIAGNOSIS — I10 Essential (primary) hypertension: Secondary | ICD-10-CM | POA: Diagnosis not present

## 2018-11-07 DIAGNOSIS — Z1159 Encounter for screening for other viral diseases: Secondary | ICD-10-CM | POA: Diagnosis not present

## 2018-11-07 DIAGNOSIS — F419 Anxiety disorder, unspecified: Secondary | ICD-10-CM | POA: Diagnosis not present

## 2018-11-07 DIAGNOSIS — N1831 Chronic kidney disease, stage 3a: Secondary | ICD-10-CM | POA: Diagnosis not present

## 2018-11-07 DIAGNOSIS — Z23 Encounter for immunization: Secondary | ICD-10-CM | POA: Diagnosis not present

## 2018-11-07 DIAGNOSIS — E1169 Type 2 diabetes mellitus with other specified complication: Secondary | ICD-10-CM | POA: Diagnosis not present

## 2018-11-12 ENCOUNTER — Telehealth: Payer: Self-pay | Admitting: Cardiovascular Disease

## 2018-11-12 NOTE — Telephone Encounter (Signed)
   Primary Cardiologist: Ida Rogue, MD  Chart reviewed as part of pre-operative protocol coverage. Given past medical history and time since last visit, based on ACC/AHA guidelines, Ronald Wallace would be at acceptable risk for the planned procedure without further cardiovascular testing.   Chart reviewed as part of pre-operative protocol coverage. Patient was contacted 08/08/2018 in reference to pre-operative risk assessment for pending surgery as outlined below.  MOSHA PUNCHES was last seen on 03/11/2018 by Dr. Rockey Situ.  [name] continued to have mild shortness of breath with exertion however was thought to be chronic and in the setting of obesity and deconditioning.  Per Dr. Rockey Situ, patient should not have to hold aspirin therapy for colonoscopy therefore, would recommend no interruption. He is currently not on any anticoagulation nor Plavix.  Therefore, based on ACC/AHA guidelines, the patient would be at acceptable risk for the planned procedure without further cardiovascular testing.   I will route this recommendation to the requesting party via Epic fax function and remove from pre-op pool.  Please call with questions.   I will route this recommendation to the requesting party via Epic fax function and remove from pre-op pool.  Please call with questions.  Kathyrn Drown, NP 11/12/2018, 10:01 AM

## 2018-11-12 NOTE — Telephone Encounter (Signed)
   [facility] Pre-operative Risk Assessment    Request for surgical clearance:  1. What type of surgery is being performed? COLONOSCOPY  When is this surgery scheduled? 12/04/18  2. What type of clearance is required (medical clearance vs. Pharmacy clearance to hold med vs. Both)? NOT LISTED  3. Are there any medications that need to be held prior to surgery and how long? NOT LISTED  4. Practice name and name of physician performing surgery? [facility], PA-C  5. What is your office phone number [phone]   7.   What is your office fax number [phone]  8.   Anesthesia type (None, local, MAC, general) ? Genice Rouge, Gabriel Cirri 11/12/2018, 9:06 AM  _________________________________________________________________   (provider comments below)

## 2018-11-13 DIAGNOSIS — E1140 Type 2 diabetes mellitus with diabetic neuropathy, unspecified: Secondary | ICD-10-CM | POA: Diagnosis not present

## 2018-11-13 DIAGNOSIS — Z794 Long term (current) use of insulin: Secondary | ICD-10-CM | POA: Diagnosis not present

## 2018-11-13 DIAGNOSIS — E1169 Type 2 diabetes mellitus with other specified complication: Secondary | ICD-10-CM | POA: Diagnosis not present

## 2018-11-13 DIAGNOSIS — E1142 Type 2 diabetes mellitus with diabetic polyneuropathy: Secondary | ICD-10-CM | POA: Diagnosis not present

## 2018-11-13 DIAGNOSIS — I10 Essential (primary) hypertension: Secondary | ICD-10-CM | POA: Diagnosis not present

## 2018-11-13 DIAGNOSIS — E785 Hyperlipidemia, unspecified: Secondary | ICD-10-CM | POA: Diagnosis not present

## 2018-11-13 DIAGNOSIS — E1159 Type 2 diabetes mellitus with other circulatory complications: Secondary | ICD-10-CM | POA: Diagnosis not present

## 2018-11-13 DIAGNOSIS — B351 Tinea unguium: Secondary | ICD-10-CM | POA: Diagnosis not present

## 2018-11-13 DIAGNOSIS — L03032 Cellulitis of left toe: Secondary | ICD-10-CM | POA: Diagnosis not present

## 2018-11-13 DIAGNOSIS — E119 Type 2 diabetes mellitus without complications: Secondary | ICD-10-CM | POA: Diagnosis not present

## 2018-11-29 ENCOUNTER — Other Ambulatory Visit: Payer: Self-pay

## 2018-11-29 ENCOUNTER — Other Ambulatory Visit
Admission: RE | Admit: 2018-11-29 | Discharge: 2018-11-29 | Disposition: A | Discharge status: Home / Self Care | Payer: PPO | Source: Ambulatory Visit | Attending: Internal Medicine | Admitting: Internal Medicine

## 2018-11-29 DIAGNOSIS — Z01812 Encounter for preprocedural laboratory examination: Secondary | ICD-10-CM | POA: Diagnosis not present

## 2018-11-29 DIAGNOSIS — Z20828 Contact with and (suspected) exposure to other viral communicable diseases: Secondary | ICD-10-CM | POA: Diagnosis not present

## 2018-11-30 LAB — SARS CORONAVIRUS 2 (TAT 6-24 HRS): SARS Coronavirus 2: NEGATIVE

## 2018-12-03 ENCOUNTER — Encounter: Payer: Self-pay | Admitting: *Deleted

## 2018-12-04 ENCOUNTER — Ambulatory Visit
Admission: RE | Admit: 2018-12-04 | Discharge: 2018-12-04 | Disposition: A | Discharge status: Home / Self Care | Payer: PPO | Attending: Internal Medicine | Admitting: Internal Medicine

## 2018-12-04 ENCOUNTER — Ambulatory Visit: Payer: PPO | Admitting: Anesthesiology

## 2018-12-04 ENCOUNTER — Encounter
Admission: RE | Disposition: A | Discharge status: Home / Self Care | Payer: Self-pay | Source: Home / Self Care | Attending: Internal Medicine

## 2018-12-04 ENCOUNTER — Other Ambulatory Visit: Payer: Self-pay

## 2018-12-04 ENCOUNTER — Encounter: Payer: Self-pay | Admitting: *Deleted

## 2018-12-04 DIAGNOSIS — E7800 Pure hypercholesterolemia, unspecified: Secondary | ICD-10-CM | POA: Insufficient documentation

## 2018-12-04 DIAGNOSIS — Z86010 Personal history of colonic polyps: Secondary | ICD-10-CM | POA: Diagnosis not present

## 2018-12-04 DIAGNOSIS — M1990 Unspecified osteoarthritis, unspecified site: Secondary | ICD-10-CM | POA: Diagnosis not present

## 2018-12-04 DIAGNOSIS — E669 Obesity, unspecified: Secondary | ICD-10-CM | POA: Diagnosis not present

## 2018-12-04 DIAGNOSIS — K5730 Diverticulosis of large intestine without perforation or abscess without bleeding: Secondary | ICD-10-CM | POA: Diagnosis not present

## 2018-12-04 DIAGNOSIS — Z6830 Body mass index (BMI) 30.0-30.9, adult: Secondary | ICD-10-CM | POA: Diagnosis not present

## 2018-12-04 DIAGNOSIS — N189 Chronic kidney disease, unspecified: Secondary | ICD-10-CM | POA: Insufficient documentation

## 2018-12-04 DIAGNOSIS — Z87891 Personal history of nicotine dependence: Secondary | ICD-10-CM | POA: Diagnosis not present

## 2018-12-04 DIAGNOSIS — F419 Anxiety disorder, unspecified: Secondary | ICD-10-CM | POA: Insufficient documentation

## 2018-12-04 DIAGNOSIS — F329 Major depressive disorder, single episode, unspecified: Secondary | ICD-10-CM | POA: Insufficient documentation

## 2018-12-04 DIAGNOSIS — Z7982 Long term (current) use of aspirin: Secondary | ICD-10-CM | POA: Insufficient documentation

## 2018-12-04 DIAGNOSIS — E1122 Type 2 diabetes mellitus with diabetic chronic kidney disease: Secondary | ICD-10-CM | POA: Diagnosis not present

## 2018-12-04 DIAGNOSIS — E785 Hyperlipidemia, unspecified: Secondary | ICD-10-CM | POA: Diagnosis not present

## 2018-12-04 DIAGNOSIS — I129 Hypertensive chronic kidney disease with stage 1 through stage 4 chronic kidney disease, or unspecified chronic kidney disease: Secondary | ICD-10-CM | POA: Insufficient documentation

## 2018-12-04 DIAGNOSIS — I2510 Atherosclerotic heart disease of native coronary artery without angina pectoris: Secondary | ICD-10-CM | POA: Insufficient documentation

## 2018-12-04 DIAGNOSIS — E1140 Type 2 diabetes mellitus with diabetic neuropathy, unspecified: Secondary | ICD-10-CM | POA: Diagnosis not present

## 2018-12-04 DIAGNOSIS — Z1211 Encounter for screening for malignant neoplasm of colon: Secondary | ICD-10-CM | POA: Diagnosis not present

## 2018-12-04 DIAGNOSIS — K5790 Diverticulosis of intestine, part unspecified, without perforation or abscess without bleeding: Secondary | ICD-10-CM | POA: Diagnosis not present

## 2018-12-04 DIAGNOSIS — Z79899 Other long term (current) drug therapy: Secondary | ICD-10-CM | POA: Diagnosis not present

## 2018-12-04 DIAGNOSIS — Z794 Long term (current) use of insulin: Secondary | ICD-10-CM | POA: Insufficient documentation

## 2018-12-04 HISTORY — DX: Pure hypercholesterolemia, unspecified: E78.00

## 2018-12-04 HISTORY — DX: Bradycardia, unspecified: R00.1

## 2018-12-04 HISTORY — PX: COLONOSCOPY WITH PROPOFOL: SHX5780

## 2018-12-04 HISTORY — DX: Age-related osteoporosis without current pathological fracture: M81.0

## 2018-12-04 HISTORY — DX: Chronic kidney disease, unspecified: N18.9

## 2018-12-04 HISTORY — DX: Obesity, unspecified: E66.9

## 2018-12-04 HISTORY — DX: Anxiety disorder, unspecified: F41.9

## 2018-12-04 HISTORY — DX: Unspecified osteoarthritis, unspecified site: M19.90

## 2018-12-04 HISTORY — DX: Depression, unspecified: F32.A

## 2018-12-04 LAB — GLUCOSE, CAPILLARY: Glucose-Capillary: 191 mg/dL — ABNORMAL HIGH (ref 70–99)

## 2018-12-04 SURGERY — COLONOSCOPY WITH PROPOFOL
Anesthesia: General

## 2018-12-04 MED ORDER — PROPOFOL 500 MG/50ML IV EMUL
INTRAVENOUS | Status: DC | PRN
  Administered 2018-12-04: 175 ug/kg/min via INTRAVENOUS

## 2018-12-04 MED ORDER — SODIUM CHLORIDE 0.9 % IV SOLN
INTRAVENOUS | Status: DC
  Administered 2018-12-04: 08:00:00 via INTRAVENOUS

## 2018-12-04 MED ORDER — PROPOFOL 10 MG/ML IV BOLUS
INTRAVENOUS | Status: DC | PRN
  Administered 2018-12-04: 70 mg via INTRAVENOUS

## 2018-12-04 MED ORDER — LIDOCAINE HCL (PF) 1 % IJ SOLN
INTRAMUSCULAR | Status: AC
  Filled 2018-12-04: qty 2

## 2018-12-04 MED ORDER — PROPOFOL 500 MG/50ML IV EMUL
INTRAVENOUS | Status: AC
  Filled 2018-12-04: qty 50

## 2018-12-04 MED ORDER — GLYCOPYRROLATE 0.2 MG/ML IJ SOLN
INTRAMUSCULAR | Status: DC | PRN
  Administered 2018-12-04: 0.2 mg via INTRAVENOUS

## 2018-12-04 MED ORDER — LIDOCAINE HCL (CARDIAC) PF 100 MG/5ML IV SOSY
PREFILLED_SYRINGE | INTRAVENOUS | Status: DC | PRN
  Administered 2018-12-04: 50 mg via INTRAVENOUS

## 2018-12-04 NOTE — Anesthesia Postprocedure Evaluation (Signed)
Anesthesia Post Note  Patient: Ronald Wallace  Procedure(s) Performed: COLONOSCOPY WITH PROPOFOL (N/A )  Patient location during evaluation: PACU Anesthesia Type: General Level of consciousness: awake and alert Pain management: pain level controlled Vital Signs Assessment: post-procedure vital signs reviewed and stable Respiratory status: spontaneous breathing, nonlabored ventilation and respiratory function stable Cardiovascular status: blood pressure returned to baseline and stable Postop Assessment: no apparent nausea or vomiting Anesthetic complications: no     Last Vitals:  Vitals:   12/04/18 0910 12/04/18 0920  BP: (!) 147/82 (!) 160/75  Pulse: 67 60  Resp: 13 (!) 21  Temp:    SpO2: 98% 98%    Last Pain:  Vitals:   12/04/18 0840  TempSrc: Temporal  PainSc:                  Durenda Hurt

## 2018-12-04 NOTE — Transfer of Care (Signed)
Immediate Anesthesia Transfer of Care Note  Patient: Ronald Wallace  Procedure(s) Performed: COLONOSCOPY WITH PROPOFOL (N/A )  Patient Location: PACU  Anesthesia Type:General  Level of Consciousness: awake and alert   Airway & Oxygen Therapy: Patient Spontanous Breathing and Patient connected to nasal cannula oxygen  Post-op Assessment: Report given to RN and Post -op Vital signs reviewed and stable  Post vital signs: Reviewed and stable  Last Vitals:  Vitals Value Taken Time  BP 105/45 12/04/18 0840  Temp    Pulse 66 12/04/18 0841  Resp 19 12/04/18 0841  SpO2 98 % 12/04/18 0841  Vitals shown include unvalidated device data.  Last Pain:  Vitals:   12/04/18 0840  TempSrc: Temporal  PainSc:          Complications: No apparent anesthesia complications

## 2018-12-04 NOTE — Anesthesia Post-op Follow-up Note (Signed)
Anesthesia QCDR form completed.        

## 2018-12-04 NOTE — H&P (Signed)
Outpatient short stay form Pre-procedure 12/04/2018 8:18 AM Ronald Wallace, M.D.  Primary Physician: Ronald Wallace, M.D.  Reason for visit:  Personal hx of adenomatous colon polyp - 01/14/13 colonoscopy.  History of present illness:                            Patient presents for colonoscopy for a personal hx of colon polyps. The patient denies abdominal pain, abnormal weight loss or rectal bleeding.     Current Facility-Administered Medications:  .  0.9 %  sodium chloride infusion, , Intravenous, Continuous, [place], Benay Pike, MD, Last Rate: 20 mL/hr at 12/04/18 [id number]  Medications Prior to Admission  Medication Sig Dispense Refill Last Dose  . aspirin EC 81 MG tablet Take 81 mg by mouth daily.   Past Week at Unknown time  . calcium carbonate (OS-CAL) 600 MG TABS tablet Take 600 mg by mouth daily with breakfast.   Past Week at Unknown time  . cholecalciferol (VITAMIN D) 1000 UNITS tablet Take 2,000 Units by mouth daily.    Past Week at Unknown time  . ferrous sulfate 325 (65 FE) MG tablet Take 325 mg by mouth daily with breakfast.   Past Week at Unknown time  . gabapentin (NEURONTIN) 300 MG capsule Take 300-600 mg by mouth See admin instructions. Take 1 capsule (300 mg) by mouth in the morning & 2 capsules (600 mg) by mouth every night.   Past Week at Unknown time  . hydrochlorothiazide (MICROZIDE) 12.5 MG capsule Take 12.5 mg by mouth daily with breakfast.   Past Week at Unknown time  . isosorbide mononitrate (IMDUR) 30 MG 24 hr tablet Take 1 tablet by mouth once daily 90 tablet 2 Past Week at Unknown time  . LANTUS 100 UNIT/ML injection Inject 45 Units into the skin at bedtime. Take at bedtime   Past Week at Unknown time  . losartan (COZAAR) 50 MG tablet Take 50 mg by mouth daily.    Past Week at Unknown time  . metFORMIN (GLUCOPHAGE) 500 MG tablet Take 250 mg by mouth 2 (two) times daily with a meal.   Past Week at Unknown time  . NOVOLOG 100 UNIT/ML injection Inject 20-38 Units  into the skin 3 (three) times daily with meals. Sliding scale insulin base rates= 20 units in the morning, 26 units with lunch, 26 units with supper.   Past Week at Unknown time  . PARoxetine (PAXIL) 20 MG tablet Take 20 mg by mouth daily.   Past Week at Unknown time  . simvastatin (ZOCOR) 20 MG tablet Take 20 mg by mouth daily.   Past Week at Unknown time  . tamsulosin (FLOMAX) 0.4 MG CAPS capsule Take 0.4 mg by mouth daily. Takes 2 each morning.   Past Week at Unknown time     Allergies  Allergen Reactions  . Amlodipine     Other reaction(s): Other (See Comments) Peripheral edema     Past Medical History:  Diagnosis Date  . Anemia   . Anxiety   . Arthritis   . Bradycardia   . CAD (coronary artery disease)    a. 02/2014 Neg Ex MV, EF 65%;  b. 06/2015 Abnl MV ([facility])-->Cath ([place]): LM nl, LAD 30p/m, LCX 30p, OM2 50p, RCA 100p w/ L-->R collats, nl EF-->Med Rx; c. 10/2017 MV: No isch/scar; d. 11/2017 Cath: LM nl, LAD 45p/m, LCX 30ost, OM2 30, RCA 100 CTO, L->R collats. PA 31/16/22, PCWP 11.  Marland Kitchen  CKD (chronic kidney disease)   . Depression   . Diabetes mellitus, type II, insulin dependent ([place])    With neuropathy  . Diverticulitis   . Essential hypertension   . Hyperlipidemia   . Neuropathy   . Obesity   . Osteoporosis   . Pure hypercholesterolemia   . Shortness of breath dyspnea   . Syncope and collapse    a. 02/2014 Carotid Dopplers: 1-39% bilateral ICA with normal Subclavian & vertebral arteries; b. 02/2014 2 D Echo: EF 50-55%, Gr 1 DD, Mild MR & LA dilation -- was bradycardic in 40s; c. 04/2014 Event monitor: RSR/SA/SB. Occas PVC's. No AFib.    Review of systems:  Otherwise negative.    Physical Exam  Gen: Alert, oriented. Appears stated age.  HEENT: [place]/AT. PERRLA. Lungs: CTA, no wheezes. CV: RR nl S1, S2. Abd: soft, benign, no masses. BS+ Ext: No edema. Pulses 2+    Planned procedures: Proceed with colonoscopy. The patient understands the nature of the planned  procedure, indications, risks, alternatives and potential complications including but not limited to bleeding, infection, perforation, damage to internal organs and possible oversedation/side effects from anesthesia. The patient agrees and gives consent to proceed.  Please refer to procedure notes for findings, recommendations and patient disposition/instructions.     Ronald Wallace, M.D. Gastroenterology 12/04/2018  8:18 AM

## 2018-12-04 NOTE — Anesthesia Procedure Notes (Signed)
Date/Time: 12/04/2018 8:20 AM Performed by: Johnna Acosta, CRNA Pre-anesthesia Checklist: Patient identified, Emergency Drugs available, Suction available, Patient being monitored and Timeout performed Patient Re-evaluated:Patient Re-evaluated prior to induction Oxygen Delivery Method: Nasal cannula Preoxygenation: Pre-oxygenation with 100% oxygen Induction Type: IV induction

## 2018-12-04 NOTE — Anesthesia Preprocedure Evaluation (Addendum)
Anesthesia Evaluation  Patient identified by MRN, date of birth, ID band Patient awake    Reviewed: Allergy & Precautions, H&P , NPO status , Patient's Chart, lab work & pertinent test results  Airway Mallampati: III  TM Distance: >3 FB Neck ROM: limited    Dental  (+) Poor Dentition, Chipped   Pulmonary shortness of breath, former smoker,           Cardiovascular hypertension, + CAD and + DOE (reports this has been stable)  + dysrhythmias (bradycardia)      Neuro/Psych PSYCHIATRIC DISORDERS Anxiety Depression negative neurological ROS     GI/Hepatic negative GI ROS, Neg liver ROS,   Endo/Other  diabetes  Renal/GU negative Renal ROS  negative genitourinary   Musculoskeletal   Abdominal   Peds  Hematology  (+) Blood dyscrasia, anemia ,   Anesthesia Other Findings Past Medical History: No date: Anemia No date: Anxiety No date: Arthritis No date: Bradycardia No date: CAD (coronary artery disease)     Comment:  a. 02/2014 Neg Ex MV, EF 65%;  b. 06/2015 Abnl MV               ([facility])-->Cath ([facility]): LM nl, LAD 30p/m, LCX 30p,               OM2 50p, RCA 100p w/ L-->R collats, nl EF-->Med Rx; c.               10/2017 MV: No isch/scar; d. 11/2017 Cath: LM nl, LAD               45p/m, LCX 30ost, OM2 30, RCA 100 CTO, L->R collats. PA               31/16/22, PCWP 11. No date: CKD (chronic kidney disease) No date: Depression No date: Diabetes mellitus, type II, insulin dependent (HCC)     Comment:  With neuropathy No date: Diverticulitis No date: Essential hypertension No date: Hyperlipidemia No date: Neuropathy No date: Obesity No date: Osteoporosis No date: Pure hypercholesterolemia No date: Shortness of breath dyspnea No date: Syncope and collapse     Comment:  a. 02/2014 Carotid Dopplers: 1-39% bilateral ICA with               normal Subclavian & vertebral arteries; b. 02/2014 2 D               Echo: EF  50-55%, Gr 1 DD, Mild MR & LA dilation -- was               bradycardic in 40s; c. 04/2014 Event monitor: RSR/SA/SB.               Occas PVC's. No AFib.  Past Surgical History: 07/07/2015: CARDIAC CATHETERIZATION; Left     Comment:  Procedure: Left Heart Cath and Coronary Angiography;                Surgeon: Isaias Cowman, MD;  Location: [facility];  Service: Cardiovascular;  Laterality:               Left; No date: CARPAL TUNNEL RELEASE No date: CATARACT EXTRACTION; Right 10/06/2015: CATARACT EXTRACTION W/PHACO; Left     Comment:  Procedure: CATARACT EXTRACTION PHACO AND INTRAOCULAR               LENS PLACEMENT (IOC);  Surgeon: Estill Cotta, MD;  Location: [facility];  Service: Ophthalmology;  Laterality:              Left;  [place] 01:42 AP% 25.1 CDE 45.92 Fluid pack lot #               [id number] No date: COLONOSCOPY 03/13/2014: NM MYOVIEW LTD     Comment:  LOW RISK - positive EKG but negative imaging: 2 mm ST               Depression in V4 & V5 but No scintigraphic evidence of               Ischemia or Infarct o 11/21/2017: RIGHT/LEFT HEART CATH AND CORONARY ANGIOGRAPHY; N/A     Comment:  Procedure: RIGHT/LEFT HEART CATH AND CORONARY               ANGIOGRAPHY;  Surgeon: Minna Merritts, MD;  Location:               [facility];  Service: Cardiovascular;                Laterality: N/A; 03/12/2014: TRANSTHORACIC ECHOCARDIOGRAM     Comment:  EF 50-55%, Gr 1 DD, no RWMA, Mlid MR & LA dil, Normla RV              and bradycardic 40s -  BMI    Body Mass Index: 30.41 kg/m      Reproductive/Obstetrics negative OB ROS                            Anesthesia Physical Anesthesia Plan  ASA: III  Anesthesia Plan: General   Post-op Pain Management:    Induction:   PONV Risk Score and Plan: Propofol infusion and TIVA  Airway Management Planned: Natural Airway and Nasal Cannula  Additional Equipment:    Intra-op Plan:   Post-operative Plan:   Informed Consent: I have reviewed the patients History and Physical, chart, labs and discussed the procedure including the risks, benefits and alternatives for the proposed anesthesia with the patient or authorized representative who has indicated his/her understanding and acceptance.     Dental Advisory Given  Plan Discussed with: Anesthesiologist, CRNA and Surgeon  Anesthesia Plan Comments:         Anesthesia Quick Evaluation

## 2018-12-04 NOTE — Interval H&P Note (Signed)
History and Physical Interval Note:  12/04/2018 8:19 AM  Ronald Wallace  has presented today for surgery, with the diagnosis of PERSONAL HX.OF COLON POLYPS.  The various methods of treatment have been discussed with the patient and family. After consideration of risks, benefits and other options for treatment, the patient has consented to  Procedure(s): COLONOSCOPY WITH PROPOFOL (N/A) as a surgical intervention.  The patient's history has been reviewed, patient examined, no change in status, stable for surgery.  I have reviewed the patient's chart and labs.  Questions were answered to the patient's satisfaction.     Walker, [place]

## 2018-12-04 NOTE — Op Note (Signed)
[facility]astroenterology Patient Name: Ronald Wallace Procedure Date: 12/04/2018 7:21 AM MRN: [id number] Account #: [id number] Date of Birth: December 19, 1940 Admit Type: Outpatient Age: 78 Room: [facility] ENDO ROOM 3 Gender: Male Note Status: Finalized Procedure:             Colonoscopy Indications:           Surveillance: Personal history of adenomatous polyps                         on last colonoscopy > 5 years ago Providers:             Lorie Apley K. Alice Reichert MD, MD Referring MD:          [name] (Referring MD) Medicines:             Propofol per Anesthesia Complications:         No immediate complications. Procedure:             Pre-Anesthesia Assessment:                        - The risks and benefits of the procedure and the                         sedation options and risks were discussed with the                         patient. All questions were answered and informed                         consent was obtained.                        - Patient identification and proposed procedure were                         verified prior to the procedure by the nurse. The                         procedure was verified in the procedure room.                        - ASA Grade Assessment: III - A patient with severe                         systemic disease.                        - After reviewing the risks and benefits, the patient                         was deemed in satisfactory condition to undergo the                         procedure.                        After obtaining informed consent, the colonoscope was                         passed under direct vision. Throughout  the procedure,                         the patient's blood pressure, pulse, and oxygen                         saturations were monitored continuously. The                         Colonoscope was introduced through the anus and                         advanced to the the cecum, identified by  appendiceal                         orifice and ileocecal valve. The colonoscopy was                         performed without difficulty. The patient tolerated                         the procedure well. The quality of the bowel                         preparation was good. The ileocecal valve, appendiceal                         orifice, and rectum were photographed. Findings:      The perianal and digital rectal examinations were normal. Pertinent       negatives include normal sphincter tone and no palpable rectal lesions.      Many small-mouthed diverticula were found in the sigmoid colon. There       was no evidence of diverticular bleeding.      The exam was otherwise without abnormality on direct and retroflexion       views. Impression:            - Mild diverticulosis in the sigmoid colon. There was                         no evidence of diverticular bleeding.                        - The examination was otherwise normal on direct and                         retroflexion views.                        - No specimens collected. Recommendation:        - Patient has a contact number available for                         emergencies. The signs and symptoms of potential                         delayed complications were discussed with the patient.                         Return to normal activities  tomorrow. Written                         discharge instructions were provided to the patient.                        - Resume previous diet.                        - Continue present medications.                        - No repeat colonoscopy due to current age (32 years                         or older) and the absence of advanced adenomas.                        - Return to GI office PRN.                        - The findings and recommendations were discussed with                         the patient.                        - The findings and recommendations were discussed with                          the patient and their spouse. Procedure Code(s):     --- Professional ---                        [id number], Colorectal cancer screening; colonoscopy on                         individual at high risk Diagnosis Code(s):     --- Professional ---                        K57.30, Diverticulosis of large intestine without                         perforation or abscess without bleeding                        Z86.010, Personal history of colonic polyps CPT copyright 2019 American Medical Association. All rights reserved. The codes documented in this report are preliminary and upon coder review may  be revised to meet current compliance requirements. Efrain Sella MD, MD 12/04/2018 8:39:28 AM This report has been signed electronically. Number of Addenda: 0 Note Initiated On: 12/04/2018 7:21 AM Scope Withdrawal Time: 0 hours 6 minutes 12 seconds  Total Procedure Duration: 0 hours 9 minutes 2 seconds  Estimated Blood Loss:  Estimated blood loss: none.      [facility]

## 2018-12-05 ENCOUNTER — Encounter: Payer: Self-pay | Admitting: Internal Medicine

## 2019-02-25 DIAGNOSIS — M5416 Radiculopathy, lumbar region: Secondary | ICD-10-CM | POA: Diagnosis not present

## 2019-03-04 DIAGNOSIS — E1142 Type 2 diabetes mellitus with diabetic polyneuropathy: Secondary | ICD-10-CM | POA: Diagnosis not present

## 2019-03-04 DIAGNOSIS — E1159 Type 2 diabetes mellitus with other circulatory complications: Secondary | ICD-10-CM | POA: Diagnosis not present

## 2019-03-04 DIAGNOSIS — E785 Hyperlipidemia, unspecified: Secondary | ICD-10-CM | POA: Diagnosis not present

## 2019-03-04 DIAGNOSIS — E1169 Type 2 diabetes mellitus with other specified complication: Secondary | ICD-10-CM | POA: Diagnosis not present

## 2019-03-04 DIAGNOSIS — I10 Essential (primary) hypertension: Secondary | ICD-10-CM | POA: Diagnosis not present

## 2019-03-04 DIAGNOSIS — Z794 Long term (current) use of insulin: Secondary | ICD-10-CM | POA: Diagnosis not present

## 2019-03-21 ENCOUNTER — Encounter: Payer: Self-pay | Admitting: Cardiovascular Disease

## 2019-03-21 ENCOUNTER — Ambulatory Visit (INDEPENDENT_AMBULATORY_CARE_PROVIDER_SITE_OTHER): Payer: PPO | Admitting: Cardiovascular Disease

## 2019-03-21 ENCOUNTER — Other Ambulatory Visit: Payer: Self-pay

## 2019-03-21 VITALS — BP 130/60 | HR 65 | Ht 65.0 in | Wt 205.0 lb

## 2019-03-21 DIAGNOSIS — R002 Palpitations: Secondary | ICD-10-CM

## 2019-03-21 DIAGNOSIS — I25118 Atherosclerotic heart disease of native coronary artery with other forms of angina pectoris: Secondary | ICD-10-CM | POA: Diagnosis not present

## 2019-03-21 DIAGNOSIS — N183 Chronic kidney disease, stage 3 unspecified: Secondary | ICD-10-CM | POA: Diagnosis not present

## 2019-03-21 DIAGNOSIS — E119 Type 2 diabetes mellitus without complications: Secondary | ICD-10-CM

## 2019-03-21 DIAGNOSIS — E782 Mixed hyperlipidemia: Secondary | ICD-10-CM | POA: Diagnosis not present

## 2019-03-21 DIAGNOSIS — R0600 Dyspnea, unspecified: Secondary | ICD-10-CM | POA: Diagnosis not present

## 2019-03-21 DIAGNOSIS — Z794 Long term (current) use of insulin: Secondary | ICD-10-CM

## 2019-03-21 DIAGNOSIS — I10 Essential (primary) hypertension: Secondary | ICD-10-CM

## 2019-03-21 DIAGNOSIS — R0609 Other forms of dyspnea: Secondary | ICD-10-CM

## 2019-03-21 NOTE — Progress Notes (Signed)
Cardiology Office Note  Date:  03/21/2019   ID:  Ronald Wallace, DOB 1940/07/30, MRN [id number]  PCP:  [name], MD   Chief Complaint  Patient presents with  . other    12 month f/u c/o sob. Meds reviewed verbally with pt.    HPI:  [name] is a 79 y/o Male with a prior h/o  syncope,  HTN,  HL,   DM, remote smoking, pipe, , no cigarettes  increasing dyspnea and underwent stress testing @ [facility], which was abnl,   CAD cath in late June 2017 revealing moderate LAD and LCX/OM dzs with a total occlusion of the prox RCA,  L R collaterals.  LV fxn was nl,  who presents for routine follow-up of his coronary artery disease,  chronic shortness of breath  In follow-up today he reports having worsening shortness of breath Over the last year has not had any significant exercise  Used to go to the [facility], occasionally uses treadmill No longer doing either  Gets very winded going to get the mail Denies chest pain On prior clinic visit also reported having shortness of breath  Was doing his best after cardiac rehab Poor diet, A1c running high  Weight up: minimal HBA1C 8.6 TSH 6.5 HCT 11.2 CR 1.5 total chol 111, LDL 59   EKG personally reviewed by myself on todays visit showsNormal sinus rhythm with rate 65 bpm no significant ST or T-wave changes  Previous cardiac catheterization results from November 2019  Catheterization performed for shortness of breath  right and left heart cath   Right heart pressures were within normal range RA: mean 11, RV 39/10/15, PA: 31/16/22, Wedge: 11  Mild to moderate mid LAD disease, mild proximal and mid LCX disease, Occluded proximal RCA with left to right collaterals Normal right heart pressures  Prox LAD to Mid LAD lesion is 45% stenosed.  2nd Mrg lesion is 30% stenosed.  Ost Cx to Prox Cx lesion is 30% stenosed.  Prox RCA to Dist RCA lesion is 100% stenosed.      other past medical history  reviewed  catheterization  June 2017 discussed with him, 30% LAD disease, occluded RCA with collaterals  02/2014 Carotid Dopplers: 1-39% bilateral ICA with normal Subclavian & vertebral arteries; b. 02/2014 2 D Echo: EF 50-55%, Gr 1 DD, Mild MR & LA dilation -- was bradycardic in 40s 02/2014 Neg Ex MV, EF 65%; b. 06/2015 Abnl MV ([facility])-->Cath ([facility]): LM nl, LAD 30p/m, LCX 30p, OM2 50p, RCA 100p w/ L-->R collats, nl EF-->Med Rx.   PMH:   has a past medical history of Anemia, Anxiety, Arthritis, Bradycardia, CAD (coronary artery disease), CKD (chronic kidney disease), Depression, Diabetes mellitus, type II, insulin dependent ([place]), Diverticulitis, Essential hypertension, Hyperlipidemia, Neuropathy, Obesity, Osteoporosis, Pure hypercholesterolemia, Shortness of breath dyspnea, and Syncope and collapse.  PSH:    Past Surgical History:  Procedure Laterality Date  . CARDIAC CATHETERIZATION Left 07/07/2015   Procedure: Left Heart Cath and Coronary Angiography;  Surgeon: Isaias Cowman, MD;  Location: [facility];  Service: Cardiovascular;  Laterality: Left;  . CARPAL TUNNEL RELEASE    . CATARACT EXTRACTION Right   . CATARACT EXTRACTION W/PHACO Left 10/06/2015   Procedure: CATARACT EXTRACTION PHACO AND INTRAOCULAR LENS PLACEMENT (IOC);  Surgeon: Estill Cotta, MD;  Location: [facility];  Service: Ophthalmology;  Laterality: Left;  [place] 01:42 AP% 25.1 CDE 45.92 Fluid pack lot # [id number]  . COLONOSCOPY    . COLONOSCOPY WITH PROPOFOL N/A 12/04/2018  Procedure: COLONOSCOPY WITH PROPOFOL;  Surgeon: Toledo, Benay Pike, MD;  Location: [facility];  Service: Gastroenterology;  Laterality: N/A;  . NM MYOVIEW LTD  03/13/2014   LOW RISK - positive EKG but negative imaging: 2 mm ST Depression in V4 & V5 but No scintigraphic evidence of Ischemia or Infarct o  . RIGHT/LEFT HEART CATH AND CORONARY ANGIOGRAPHY N/A 11/21/2017   Procedure: RIGHT/LEFT HEART CATH AND CORONARY ANGIOGRAPHY;   Surgeon: Minna Merritts, MD;  Location: [facility];  Service: Cardiovascular;  Laterality: N/A;  . TRANSTHORACIC ECHOCARDIOGRAM  03/12/2014   EF 50-55%, Gr 1 DD, no RWMA, Mlid MR & LA dil, Normla RV and bradycardic 40s -    Current Outpatient Medications  Medication Sig Dispense Refill  . aspirin EC 81 MG tablet Take 81 mg by mouth daily.    . calcium carbonate (OS-CAL) 600 MG TABS tablet Take 600 mg by mouth daily with breakfast.    . cholecalciferol (VITAMIN D) 1000 UNITS tablet Take 2,000 Units by mouth daily.     . ferrous sulfate 325 (65 FE) MG tablet Take 325 mg by mouth daily with breakfast.    . gabapentin (NEURONTIN) 300 MG capsule Take 300-600 mg by mouth See admin instructions. Take 1 capsule (300 mg) by mouth in the morning & 2 capsules (600 mg) by mouth every night.    . hydrochlorothiazide (MICROZIDE) 12.5 MG capsule Take 12.5 mg by mouth daily with breakfast.    . isosorbide mononitrate (IMDUR) 30 MG 24 hr tablet Take 1 tablet by mouth once daily 90 tablet 2  . LANTUS 100 UNIT/ML injection Inject 45 Units into the skin at bedtime. Take at bedtime    . losartan (COZAAR) 50 MG tablet Take 50 mg by mouth daily.     . metFORMIN (GLUCOPHAGE) 500 MG tablet Take 250 mg by mouth 2 (two) times daily with a meal.    . NOVOLOG 100 UNIT/ML injection Inject 20-38 Units into the skin 3 (three) times daily with meals. Sliding scale insulin base rates= 20 units in the morning, 26 units with lunch, 26 units with supper.    Marland Kitchen PARoxetine (PAXIL) 20 MG tablet Take 20 mg by mouth daily.    . simvastatin (ZOCOR) 20 MG tablet Take 20 mg by mouth daily.    . tamsulosin (FLOMAX) 0.4 MG CAPS capsule Take 0.4 mg by mouth daily. Takes 2 each morning.     No current facility-administered medications for this visit.     Allergies:   Amlodipine   Social History:  The patient  reports that he quit smoking about 56 years ago. His smoking use included pipe. He quit after 1.00 year of use. He has  never used smokeless tobacco. He reports that he does not drink alcohol or use drugs.   Family History:   family history includes Breast cancer in his mother; Heart attack in his mother; Lung cancer in his father.    Review of Systems: Review of Systems  Constitutional: Negative.   Respiratory: Positive for shortness of breath.   Cardiovascular: Negative.   Gastrointestinal: Negative.   Musculoskeletal: Negative.   Neurological: Negative.   Psychiatric/Behavioral: Negative.   All other systems reviewed and are negative.   PHYSICAL EXAM: VS:  BP 130/60 (BP Location: Left Arm, Patient Position: Sitting, Cuff Size: Normal)   Pulse 65   Ht 5\' 5"  (1.651 m)   Wt 205 lb (93 kg)   SpO2 98%   BMI 34.11 kg/m  ,  BMI Body mass index is 34.11 kg/m.  Constitutional:  oriented to person, place, and time. No distress.  HENT:  Head: Grossly normal Eyes:  no discharge. No scleral icterus.  Neck: No JVD, no carotid bruits  Cardiovascular: Regular rate and rhythm, no murmurs appreciated Pulmonary/Chest: Clear to auscultation bilaterally, no wheezes or rails Abdominal: Soft.  no distension.  no tenderness.  Musculoskeletal: Normal range of motion Neurological:  normal muscle tone. Coordination normal. No atrophy Skin: Skin warm and dry Psychiatric: normal affect, pleasant  Recent Labs: No results found for requested labs within last 8760 hours.    Lipid Panel No results found for: CHOL, HDL, LDLCALC, TRIG    Wt Readings from Last 3 Encounters:  03/21/19 205 lb (93 kg)  12/04/18 200 lb (90.7 kg)  03/11/18 204 lb (92.5 kg)      ASSESSMENT AND PLAN:  Shortness of breath catheterization November 2019 showing no significant change in his disease RCA occluded with collaterals from left to right Long discussion concerning his continued debilitated condition -Even worse shape this year compared to last year On prior clinic visit with sitting, no regular exercise program, He is  continued sedentary lifestyle, now having trouble getting down to the mailbox and back without shortness of breath --- We did offer stress testing, he prefers to try a walking program If symptoms do get worse recommend he call the office and we would schedule stress test and echocardiogram -No indication of fluid retention  Essential hypertension -  Blood pressure is well controlled on today's visit. No changes made to the medications.  Mixed hyperlipidemia -  Cholesterol is at goal on the current lipid regimen. No changes to the medications were made.  Coronary artery disease of native artery of native heart with stable angina pectoris ([place]) -  Occluded RCA with collaterals from left to right by catheterization June 2017 Same findings on catheterization November 2017 Diabetes, cholesterol at goal -Stress testing for any worsening symptoms  Diabetes mellitus, type II, insulin dependent ([place]) - Plan: EKG 12-Lead Very sedentary, poor diet As on last clinic visit, we have recommended 10 pound weight loss  DOE (dyspnea on exertion) -  Long discussion on previous visits for his shortness of breath If symptoms get worse stress test could be ordered Recommended regular walking program He is sedentary, deconditioned  Chronic renal impairment, stage 3 (moderate) - Plan: EKG 12-Lead Lasix as needed for leg swelling No leg swelling on today's visit   Total encounter time more than 45 minutes  Greater than 50% was spent in counseling and coordination of care with the patient  Disposition:   F/U  12 months   No orders of the defined types were placed in this encounter.    Signed, Esmond Plants, M.D., Ph.D. 03/21/2019  [place], [place]

## 2019-03-21 NOTE — Patient Instructions (Signed)
Please walk every day Call the office if symptoms don't get better (breathing)   Medication Instructions:  No changes  If you need a refill on your cardiac medications before your next appointment, please call your pharmacy.    Lab work: No new labs needed   If you have labs (blood work) drawn today and your tests are completely normal, you will receive your results only by: Marland Kitchen MyChart Message (if you have MyChart) OR . A paper copy in the mail If you have any lab test that is abnormal or we need to change your treatment, we will call you to review the results.   Testing/Procedures: No new testing needed   Follow-Up: At [facility], you and your health needs are our priority.  As part of our continuing mission to provide you with exceptional heart care, we have created designated Provider Care Teams.  These Care Teams include your primary Cardiologist (physician) and Advanced Practice Providers (APPs -  Physician Assistants and Nurse Practitioners) who all work together to provide you with the care you need, when you need it.  . You will need a follow up appointment in 12 months   . Providers on your designated Care Team:   . Murray Hodgkins, NP . Christell Faith, PA-C . Marrianne Mood, PA-C  Any Other Special Instructions Will Be Listed Below (If Applicable).  For educational health videos Log in to : [email or web address] Or : [email or web address], password : [email or web address]

## 2019-04-04 DIAGNOSIS — M5126 Other intervertebral disc displacement, lumbar region: Secondary | ICD-10-CM | POA: Diagnosis not present

## 2019-04-04 DIAGNOSIS — M5416 Radiculopathy, lumbar region: Secondary | ICD-10-CM | POA: Diagnosis not present

## 2019-04-21 DIAGNOSIS — M5126 Other intervertebral disc displacement, lumbar region: Secondary | ICD-10-CM | POA: Diagnosis not present

## 2019-04-21 DIAGNOSIS — M5416 Radiculopathy, lumbar region: Secondary | ICD-10-CM | POA: Diagnosis not present

## 2019-05-07 ENCOUNTER — Other Ambulatory Visit: Payer: Self-pay | Admitting: Cardiovascular Disease

## 2019-05-12 DIAGNOSIS — E1169 Type 2 diabetes mellitus with other specified complication: Secondary | ICD-10-CM | POA: Diagnosis not present

## 2019-05-12 DIAGNOSIS — E1142 Type 2 diabetes mellitus with diabetic polyneuropathy: Secondary | ICD-10-CM | POA: Diagnosis not present

## 2019-05-27 DIAGNOSIS — F419 Anxiety disorder, unspecified: Secondary | ICD-10-CM | POA: Diagnosis not present

## 2019-05-27 DIAGNOSIS — R4189 Other symptoms and signs involving cognitive functions and awareness: Secondary | ICD-10-CM | POA: Diagnosis not present

## 2019-05-27 DIAGNOSIS — N1831 Chronic kidney disease, stage 3a: Secondary | ICD-10-CM | POA: Diagnosis not present

## 2019-05-30 ENCOUNTER — Encounter: Payer: Self-pay | Admitting: Neurology

## 2019-06-02 DIAGNOSIS — R4189 Other symptoms and signs involving cognitive functions and awareness: Secondary | ICD-10-CM | POA: Diagnosis not present

## 2019-06-04 DIAGNOSIS — M5126 Other intervertebral disc displacement, lumbar region: Secondary | ICD-10-CM | POA: Diagnosis not present

## 2019-06-04 DIAGNOSIS — M5416 Radiculopathy, lumbar region: Secondary | ICD-10-CM | POA: Diagnosis not present

## 2019-06-09 DIAGNOSIS — E1142 Type 2 diabetes mellitus with diabetic polyneuropathy: Secondary | ICD-10-CM | POA: Diagnosis not present

## 2019-06-09 DIAGNOSIS — F419 Anxiety disorder, unspecified: Secondary | ICD-10-CM | POA: Diagnosis not present

## 2019-06-09 DIAGNOSIS — R4189 Other symptoms and signs involving cognitive functions and awareness: Secondary | ICD-10-CM | POA: Diagnosis not present

## 2019-06-09 DIAGNOSIS — E785 Hyperlipidemia, unspecified: Secondary | ICD-10-CM | POA: Diagnosis not present

## 2019-06-09 DIAGNOSIS — Z794 Long term (current) use of insulin: Secondary | ICD-10-CM | POA: Diagnosis not present

## 2019-06-09 DIAGNOSIS — N1831 Chronic kidney disease, stage 3a: Secondary | ICD-10-CM | POA: Diagnosis not present

## 2019-06-12 DIAGNOSIS — Z794 Long term (current) use of insulin: Secondary | ICD-10-CM | POA: Diagnosis not present

## 2019-06-12 DIAGNOSIS — E1142 Type 2 diabetes mellitus with diabetic polyneuropathy: Secondary | ICD-10-CM | POA: Diagnosis not present

## 2019-06-12 DIAGNOSIS — F419 Anxiety disorder, unspecified: Secondary | ICD-10-CM | POA: Diagnosis not present

## 2019-06-12 DIAGNOSIS — E519 Thiamine deficiency, unspecified: Secondary | ICD-10-CM | POA: Diagnosis not present

## 2019-06-12 DIAGNOSIS — F329 Major depressive disorder, single episode, unspecified: Secondary | ICD-10-CM | POA: Diagnosis not present

## 2019-06-12 DIAGNOSIS — R4189 Other symptoms and signs involving cognitive functions and awareness: Secondary | ICD-10-CM | POA: Diagnosis not present

## 2019-06-12 DIAGNOSIS — E538 Deficiency of other specified B group vitamins: Secondary | ICD-10-CM | POA: Diagnosis not present

## 2019-06-14 ENCOUNTER — Other Ambulatory Visit: Payer: Self-pay | Admitting: Neurology

## 2019-06-14 DIAGNOSIS — R4189 Other symptoms and signs involving cognitive functions and awareness: Secondary | ICD-10-CM

## 2019-06-18 ENCOUNTER — Ambulatory Visit: Payer: PPO | Admitting: Neurology

## 2019-06-18 ENCOUNTER — Other Ambulatory Visit: Payer: Self-pay

## 2019-06-18 ENCOUNTER — Other Ambulatory Visit: Payer: PPO

## 2019-06-18 ENCOUNTER — Encounter: Payer: Self-pay | Admitting: Neurology

## 2019-06-18 VITALS — BP 118/55 | HR 55 | Ht 65.0 in | Wt 203.2 lb

## 2019-06-18 DIAGNOSIS — R413 Other amnesia: Secondary | ICD-10-CM | POA: Diagnosis not present

## 2019-06-18 LAB — COMPREHENSIVE METABOLIC PANEL
ALT: 17 U/L (ref 0–53)
AST: 16 U/L (ref 0–37)
Albumin: 4 g/dL (ref 3.5–5.2)
Alkaline Phosphatase: 53 U/L (ref 39–117)
BUN: 30 mg/dL — ABNORMAL HIGH (ref 6–23)
CO2: 31 mEq/L (ref 19–32)
Calcium: 9.9 mg/dL (ref 8.4–10.5)
Chloride: 107 mEq/L (ref 96–112)
Creatinine, Ser: 1.49 mg/dL (ref 0.40–1.50)
GFR: 45.66 mL/min — ABNORMAL LOW (ref >60.00)
Glucose, Bld: 84 mg/dL (ref 70–99)
Potassium: 5 mEq/L (ref 3.5–5.1)
Sodium: 140 mEq/L (ref 135–145)
Total Bilirubin: 0.4 mg/dL (ref 0.2–1.2)
Total Protein: 6.7 g/dL (ref 6.0–8.3)

## 2019-06-18 LAB — CBC
HCT: 32 % — ABNORMAL LOW (ref 39.0–52.0)
Hemoglobin: 10.9 g/dL — ABNORMAL LOW (ref 13.0–17.0)
MCHC: 34.1 g/dL (ref 30.0–36.0)
MCV: 89.5 fl (ref 78.0–100.0)
Platelets: 151 10*3/uL (ref 150.0–400.0)
RBC: 3.57 Mil/uL — ABNORMAL LOW (ref 4.22–5.81)
RDW: 12.1 % (ref 11.5–15.5)
WBC: 7.4 10*3/uL (ref 4.0–10.5)

## 2019-06-18 LAB — VITAMIN B12: Vitamin B-12: 239 pg/mL (ref 211–911)

## 2019-06-18 LAB — FOLATE: Folate: >24.8 ng/mL (ref >5.9)

## 2019-06-18 NOTE — Progress Notes (Signed)
NEUROLOGY CONSULTATION NOTE  Ronald Wallace MRN: [id number] DOB: 1940-02-01  Referring provider: Dr. [name] Primary care provider: Dr. [name]  Reason for consult:  Memory loss  Dear Dr Netty Starring:  Thank you for your kind referral of Ronald Wallace for consultation of the above symptoms. Although his history is well known to you, please allow me to reiterate it for the purpose of our medical record. The patient was accompanied to the clinic by his daughter Larene Beach, his wife is on speakerphone to provide additional information. Records and images were personally reviewed where available.   HISTORY OF PRESENT ILLNESS: This is a 79 year old right-handed man with a history of hypertension, hyperlipidemia, diabetes, CAD, CKD, bradycardia, anxiety, depression, presenting for evaluation of memory loss. His daughter,as well as wife on speakerphone, are present to provide additional information. Records were reviewed. He was seen 6 days ago at [facility] by neurologist Dr. Manuella Ghazi, however they would still like to proceed with visit today for the same concern. SLUMS score was 18/30.  Bloodwork was ordered, he is scheduled for an MRI brain with NeuroQuant sequence. His family feels that memory changes started only a month ago. He became tearful stating that "I cannot remember things," making him frustrated. He states "some things I can remember, but others I do not." He would forget a conversation from 5 minutes prior. He used to remember announcer names well. He lives with his wife who has to double check behind him on his insulin, telling him what dosage to take. He writes down bills and his wife pays them. He got lost driving twice in the past month. He got lost driving to their granddaughter's graduation, he apparently got very upset/agitated. He does not remember this. He does recall getting lost driving to get gas. He states he "just about quit driving." Family now has a  GPS tracker, his wife is always in the car with him now. He is independent with dressing and bathing.  His daughter reports that he used to exercise more, then with the pandemic he got depressed. Family noticed that he did not want to be around his grandchildren anymore, getting easily agitated. Some people say something and it "sends him off to another pedestal." He can be so sweet sometimes then curl up and not want to talk to anybody. He has always had issues with his siblings, "always been a part of him," this seems to be getting better. They do feel that since memory changes started, he is doing better with other people. Paxil was recently changed to Lexapro, which he has been taking for only 2 days. No paranoia or hallucinations. He was started on Donepezil 5mg  daily 3 weeks ago which he is overall tolerating. No family history of dementia. He does not drink alcohol. He denies any significant head injuries. He had a syncopal episode in 2018 where he woke up on the ground at work. I personally reviewed MRI brain with and without contrast done 11/2016 which did not show any acute changes. There was mild diffuse atrophy and mild to moderate chronic microvascular disease.   He used to have frequent headaches when he was working and they quieted down, but recently come back "like they used to be." There is no associated nausea/vomiting, photo/phonophobia. Family thinks it is more stress-related. He denies any dizziness, diplopia, dysarthria/dysphagia, bowel/bladder dysfunction, anosmia. He has chronic neck and back pain. He has some tingling and numbness in his hands and feet. He has  occasional hand tremors. Sleep is pretty good. They deny any REM behavior disorder, but he has fallen out of bed while sleeping. This was initially happening every 2-3 weeks, but has quieted down, last occurred 3 weeks ago. Family reports he used to exercise more but stopped when the pandemic hit.  Laboratory Data: Last HbA1c  8.8   PAST MEDICAL HISTORY: Past Medical History:  Diagnosis Date  . Anemia   . Anxiety   . Arthritis   . Bradycardia   . CAD (coronary artery disease)    a. 02/2014 Neg Ex MV, EF 65%;  b. 06/2015 Abnl MV ([facility])-->Cath ([facility]): LM nl, LAD 30p/m, LCX 30p, OM2 50p, RCA 100p w/ L-->R collats, nl EF-->Med Rx; c. 10/2017 MV: No isch/scar; d. 11/2017 Cath: LM nl, LAD 45p/m, LCX 30ost, OM2 30, RCA 100 CTO, L->R collats. PA 31/16/22, PCWP 11.  . CKD (chronic kidney disease)   . Depression   . Diabetes mellitus, type II, insulin dependent ([place])    With neuropathy  . Diverticulitis   . Essential hypertension   . Hyperlipidemia   . Neuropathy   . Obesity   . Osteoporosis   . Pure hypercholesterolemia   . Shortness of breath dyspnea   . Syncope and collapse    a. 02/2014 Carotid Dopplers: 1-39% bilateral ICA with normal Subclavian & vertebral arteries; b. 02/2014 2 D Echo: EF 50-55%, Gr 1 DD, Mild MR & LA dilation -- was bradycardic in 40s; c. 04/2014 Event monitor: RSR/SA/SB. Occas PVC's. No AFib.    PAST SURGICAL HISTORY: Past Surgical History:  Procedure Laterality Date  . CARDIAC CATHETERIZATION Left 07/07/2015   Procedure: Left Heart Cath and Coronary Angiography;  Surgeon: Isaias Cowman, MD;  Location: [place] [facility];  Service: Cardiovascular;  Laterality: Left;  . CARPAL TUNNEL RELEASE    . CATARACT EXTRACTION Right   . CATARACT EXTRACTION W/PHACO Left 10/06/2015   Procedure: CATARACT EXTRACTION PHACO AND INTRAOCULAR LENS PLACEMENT (IOC);  Surgeon: Estill Cotta, MD;  Location: [facility];  Service: Ophthalmology;  Laterality: Left;  [place] 01:42 AP% 25.1 CDE 45.92 Fluid pack lot # [id number]  . COLONOSCOPY    . COLONOSCOPY WITH PROPOFOL N/A 12/04/2018   Procedure: COLONOSCOPY WITH PROPOFOL;  Surgeon: Toledo, Benay Pike, MD;  Location: [facility];  Service: Gastroenterology;  Laterality: N/A;  . NM MYOVIEW LTD  03/13/2014   LOW RISK - positive EKG but negative  imaging: 2 mm ST Depression in V4 & V5 but No scintigraphic evidence of Ischemia or Infarct o  . RIGHT/LEFT HEART CATH AND CORONARY ANGIOGRAPHY N/A 11/21/2017   Procedure: RIGHT/LEFT HEART CATH AND CORONARY ANGIOGRAPHY;  Surgeon: Minna Merritts, MD;  Location: [place] [facility];  Service: Cardiovascular;  Laterality: N/A;  . TRANSTHORACIC ECHOCARDIOGRAM  03/12/2014   EF 50-55%, Gr 1 DD, no RWMA, Mlid MR & LA dil, Normla RV and bradycardic 40s -    MEDICATIONS: Current Outpatient Medications on File Prior to Visit  Medication Sig Dispense Refill  . acetaminophen (TYLENOL) 325 MG tablet Take 650 mg by mouth every 6 (six) hours as needed.    Marland Kitchen aspirin EC 81 MG tablet Take 81 mg by mouth daily.    . calcium carbonate (OS-CAL) 600 MG TABS tablet Take 600 mg by mouth daily with breakfast.    . cholecalciferol (VITAMIN D) 1000 UNITS tablet Take 2,000 Units by mouth daily.     . ferrous sulfate 325 (65 FE) MG tablet Take 325 mg by mouth daily with  breakfast.    . gabapentin (NEURONTIN) 300 MG capsule Take 300-600 mg by mouth See admin instructions. Take 1 capsule (300 mg) by mouth in the morning & 2 capsules (600 mg) by mouth every night.    . hydrochlorothiazide (MICROZIDE) 12.5 MG capsule Take 12.5 mg by mouth daily with breakfast.    . isosorbide mononitrate (IMDUR) 30 MG 24 hr tablet Take 1 tablet by mouth once daily 90 tablet 2  . LANTUS 100 UNIT/ML injection Inject 40 Units into the skin at bedtime. Take at bedtime    . losartan (COZAAR) 50 MG tablet Take 50 mg by mouth daily.     . metFORMIN (GLUCOPHAGE) 500 MG tablet Take 250 mg by mouth 2 (two) times daily with a meal.    . NOVOLOG 100 UNIT/ML injection Inject 20-38 Units into the skin 3 (three) times daily with meals. Sliding scale insulin base rates= 20 units in the morning, 26 units with lunch, 26 units with supper.    Marland Kitchen PARoxetine (PAXIL) 20 MG tablet Take 20 mg by mouth daily.    . simvastatin (ZOCOR) 20 MG tablet Take 20 mg by mouth  daily.    . tamsulosin (FLOMAX) 0.4 MG CAPS capsule Take 0.4 mg by mouth daily. Takes 2 each morning.     No current facility-administered medications on file prior to visit.    ALLERGIES: Allergies  Allergen Reactions  . Amlodipine     Other reaction(s): Other (See Comments) Peripheral edema    FAMILY HISTORY: Family History  Problem Relation Age of Onset  . Heart attack Mother   . Breast cancer Mother   . Lung cancer Father     SOCIAL HISTORY: Social History   Socioeconomic History  . Marital status: Married    Spouse name: Not on file  . Number of children: Not on file  . Years of education: Not on file  . Highest education level: Not on file  Occupational History  . Not on file  Tobacco Use  . Smoking status: Former Smoker    Years: 1.00    Types: Pipe    Quit date: 1965    Years since quitting: 56.4  . Smokeless tobacco: Never Used  Substance and Sexual Activity  . Alcohol use: No  . Drug use: No  . Sexual activity: Not on file  Other Topics Concern  . 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   Married - at least 1 daughter.   Never Smoked   Usually very active, but no exercise routine.   Social Determinants of Health   Financial Resource Strain:   . Difficulty of Paying Living Expenses:   Food Insecurity:   . Worried About Charity fundraiser in the Last Year:   . Arboriculturist in the Last Year:   Transportation Needs:   . Film/video editor (Medical):   Marland Kitchen Lack of Transportation (Non-Medical):   Physical Activity:   . Days of Exercise per Week:   . Minutes of Exercise per Session:   Stress:   . Feeling of Stress :   Social Connections:   . Frequency of Communication with Friends and Family:   . Frequency of Social Gatherings with Friends and Family:   . Attends Religious Services:   . Active Member of Clubs or Organizations:   . Attends Archivist Meetings:   Marland Kitchen Marital Status:    Intimate Partner Violence:   .  Fear of Current or Ex-Partner:   . Emotionally Abused:   Marland Kitchen Physically Abused:   . Sexually Abused:     REVIEW OF SYSTEMS: Constitutional: No fevers, chills, or sweats, no generalized fatigue, change in appetite Eyes: No visual changes, double vision, eye pain Ear, nose and throat: No hearing loss, ear pain, nasal congestion, sore throat Cardiovascular: No chest pain, palpitations Respiratory:  No shortness of breath at rest or with exertion, wheezes GastrointestinaI: No nausea, vomiting, diarrhea, abdominal pain, fecal incontinence Genitourinary:  No dysuria, urinary retention or frequency Musculoskeletal:  + neck pain, back pain Integumentary: No rash, pruritus, skin lesions Neurological: as above Psychiatric: + depression, no insomnia, anxiety Endocrine: No palpitations, fatigue, diaphoresis, mood swings, change in appetite, change in weight, increased thirst Hematologic/Lymphatic:  No anemia, purpura, petechiae. Allergic/Immunologic: no itchy/runny eyes, nasal congestion, recent allergic reactions, rashes  PHYSICAL EXAM: Vitals:   06/18/19 1042  BP: (!) 118/55  Pulse: (!) 55  SpO2: 96%   General: No acute distress, tearful Head:  Normocephalic/atraumatic Skin/Extremities: No rash, no edema Neurological Exam: Mental status: alert and oriented to person, place, and time, no dysarthria or aphasia, Fund of knowledge is appropriate.  Recent and remote memory are impaired.  Attention and concentration are reduced.  SLUMS score 20/30  [employer] Mental Exam 06/18/2019  Weekday Correct 1  Current year 1  What state are we in? 1  Amount spent 1  Amount left 2  # of Animals 3  5 objects recall 3  Number series 1  Hour markers 2  Time correct 1  Placed X in triangle correctly 1  Largest Figure 1  Name of male 0  Date back to work 0  Type of work 2  State she lived in 0  Total score 20   Cranial nerves: CN I: not tested CN II:  pupils equal, round and reactive to light, visual fields intact CN III, IV, VI:  full range of motion, no nystagmus, no ptosis CN V: facial sensation intact CN VII: upper and lower face symmetric CN VIII: hearing intact to conversation Bulk & Tone: normal, no fasciculations. Motor: 5/5 throughout with no pronator drift. Sensation: intact to light touch, cold, pin. Decreased vibration to left knee. Romberg test negative Deep Tendon Reflexes: +1 throughout Cerebellar: no incoordination on finger to nose testing Gait: hunched posture, slightly wide-based, no ataxia Tremor: none in office today.  IMPRESSION: This is a 79 year old right-handed man with a history of hypertension, hyperlipidemia, diabetes, CAD, CKD, bradycardia, anxiety, depression, presenting for evaluation of memory loss. His neurological exam is non-focal, SLUMS score 20/30. Family is reporting symptoms starting only in the past month with difficulty managing complex tasks (ie getting lost driving, medications). We discussed different causes of memory loss, agree with bloodwork planned by Dr. Manuella Ghazi, they will be ordered in the office today. We discussed how elevated glucose levels can also contribute to worsening memory. Proceed with volumetric MRI brain as scheduled this month. His daughter is asking about vascular dementia, this is certainly a possibility, however he is also noted to be depressed, we discussed how mood can also play a role in memory. Neurocognitive testing will be ordered to further evaluate cognitive concerns. Continue Donepezil 5mg  daily, would monitor for worsening bradycardia. Continue with Lexapro as recently started. He reports quitting driving. We discussed the importance of control of vascular risk factors, physical exercise, and brain stimulation exercises for brain health. Follow-up in 6 months, they know to call for any  changes.   Thank you for allowing me to participate in the care of this patient. Please do  not hesitate to call for any questions or concerns.   Ellouise Newer, M.D.  CC: Dr. Netty Starring

## 2019-06-18 NOTE — Patient Instructions (Addendum)
1. Schedule Neurocognitive testing  2. Bloodwork for CBC, CMP, Vit B12, Vit B1, folate, RPR  3. Continue Donepezil 5mg  daily and Lexapro daily  4. Follow-up in 6 months, call for any changes   FALL PRECAUTIONS: Be cautious when walking. Scan the area for obstacles that may increase the risk of trips and falls. When getting up in the mornings, sit up at the edge of the bed for a few minutes before getting out of bed. Consider elevating the bed at the head end to avoid drop of blood pressure when getting up. Walk always in a well-lit room (use night lights in the walls). Avoid area rugs or power cords from appliances in the middle of the walkways. Use a walker or a cane if necessary and consider physical therapy for balance exercise. Get your eyesight checked regularly.  FINANCIAL OVERSIGHT: Supervision, especially oversight when making financial decisions or transactions is also recommended.  HOME SAFETY: Consider the safety of the kitchen when operating appliances like stoves, microwave oven, and blender. Consider having supervision and share cooking responsibilities until no longer able to participate in those. Accidents with firearms and other hazards in the house should be identified and addressed as well.  DRIVING: Regarding driving, in patients with progressive memory problems, driving will be impaired. We advise to have someone else do the driving if trouble finding directions or if minor accidents are reported. Independent driving assessment is available to determine safety of driving.  ABILITY TO BE LEFT ALONE: If patient is unable to contact 911 operator, consider using LifeLine, or when the need is there, arrange for someone to stay with patients. Smoking is a fire hazard, consider supervision or cessation. Risk of wandering should be assessed by caregiver and if detected at any point, supervision and safe proof recommendations should be instituted.  MEDICATION SUPERVISION: Inability to  self-administer medication needs to be constantly addressed. Implement a mechanism to ensure safe administration of the medications.  RECOMMENDATIONS FOR ALL PATIENTS WITH MEMORY PROBLEMS: 1. Continue to exercise (Recommend 30 minutes of walking everyday, or 3 hours every week) 2. Increase social interactions - continue going to [place] and enjoy social gatherings with friends and family 3. Eat healthy, avoid fried foods and eat more fruits and vegetables 4. Maintain adequate blood pressure, blood sugar, and blood cholesterol level. Reducing the risk of stroke and cardiovascular disease also helps promoting better memory. 5. Avoid stressful situations. Live a simple life and avoid aggravations. Organize your time and prepare for the next day in anticipation. 6. Sleep well, avoid any interruptions of sleep and avoid any distractions in the bedroom that may interfere with adequate sleep quality 7. Avoid sugar, avoid sweets as there is a strong link between excessive sugar intake, diabetes, and cognitive impairment We discussed the Mediterranean diet, which has been shown to help patients reduce the risk of progressive memory disorders and reduces cardiovascular risk. This includes eating fish, eat fruits and green leafy vegetables, nuts like almonds and hazelnuts, walnuts, and also use olive oil. Avoid fast foods and fried foods as much as possible. Avoid sweets and sugar as sugar use has been linked to worsening of memory function.  There is always a concern of gradual progression of memory problems. If this is the case, then we may need to adjust level of care according to patient needs. Support, both to the patient and caregiver, should then be put into place.

## 2019-06-24 LAB — VITAMIN B1: Vitamin B1 (Thiamine): 13 nmol/L (ref 8–30)

## 2019-06-24 LAB — RPR: RPR Ser Ql: NONREACTIVE

## 2019-06-27 ENCOUNTER — Telehealth: Payer: Self-pay | Admitting: Neurology

## 2019-06-27 DIAGNOSIS — R351 Nocturia: Secondary | ICD-10-CM | POA: Diagnosis not present

## 2019-06-27 DIAGNOSIS — N401 Enlarged prostate with lower urinary tract symptoms: Secondary | ICD-10-CM | POA: Diagnosis not present

## 2019-06-27 DIAGNOSIS — R32 Unspecified urinary incontinence: Secondary | ICD-10-CM | POA: Diagnosis not present

## 2019-06-27 NOTE — Telephone Encounter (Signed)
Pls let wife know the bloodwork was overall okay. There was mild dehydration and mild anemia that he should follow-up with PCP on. His B12 level was within normal, 239, low normal. When patients have memory issues, we want the level to be above 400. Pls have her start a daily vitamin B12 552mcg. Proceed with Neurocognitive testing, thanks

## 2019-06-27 NOTE — Telephone Encounter (Signed)
Patient's wife called to check on the status of the lab results from 06/18/19.

## 2019-06-28 ENCOUNTER — Ambulatory Visit: Payer: PPO

## 2019-06-30 ENCOUNTER — Other Ambulatory Visit: Payer: Self-pay

## 2019-06-30 ENCOUNTER — Telehealth: Payer: Self-pay | Admitting: Neurology

## 2019-06-30 ENCOUNTER — Other Ambulatory Visit: Payer: Self-pay | Admitting: *Deleted

## 2019-06-30 ENCOUNTER — Ambulatory Visit
Admission: RE | Admit: 2019-06-30 | Discharge: 2019-06-30 | Disposition: A | Discharge status: Home / Self Care | Payer: PPO | Source: Ambulatory Visit | Attending: Family Medicine | Admitting: Family Medicine

## 2019-06-30 ENCOUNTER — Other Ambulatory Visit: Payer: Self-pay | Admitting: Family Medicine

## 2019-06-30 DIAGNOSIS — N3289 Other specified disorders of bladder: Secondary | ICD-10-CM | POA: Diagnosis not present

## 2019-06-30 DIAGNOSIS — R32 Unspecified urinary incontinence: Secondary | ICD-10-CM

## 2019-06-30 NOTE — Telephone Encounter (Signed)
Patient daughter wants to know if we have the results back of the test he had done please call

## 2019-06-30 NOTE — Telephone Encounter (Signed)
Patient advised. Will contacted will check with insurance before referral is  placed and will call office back to schedule.

## 2019-06-30 NOTE — Addendum Note (Signed)
Addended by: Despina Hidden on: 06/30/2019 04:35 PM   Modules accepted: Orders

## 2019-06-30 NOTE — Telephone Encounter (Signed)
Patients wife to check with insurance and see if approved and will call office back to schedule.

## 2019-07-01 ENCOUNTER — Encounter: Payer: Self-pay | Admitting: Urology

## 2019-07-01 ENCOUNTER — Ambulatory Visit: Payer: PPO | Admitting: Urology

## 2019-07-01 VITALS — BP 120/63 | HR 55 | Ht 67.0 in | Wt 200.0 lb

## 2019-07-01 DIAGNOSIS — R32 Unspecified urinary incontinence: Secondary | ICD-10-CM

## 2019-07-01 DIAGNOSIS — R1084 Generalized abdominal pain: Secondary | ICD-10-CM

## 2019-07-01 LAB — BLADDER SCAN AMB NON-IMAGING: Scan Result: 94

## 2019-07-01 NOTE — Patient Instructions (Signed)
1. Avoid diet drinks and sodas as these can irritate the bladder 2. Urinate every 2 hours during the day, minimize fluids after 7pm, and urinate twice before bed   Overactive Bladder, Adult  Overactive bladder refers to a condition in which a person has a sudden need to pass urine. The person may leak urine if he or she cannot get to the bathroom fast enough (urinary incontinence). A person with this condition may also wake up several times in the night to go to the bathroom. Overactive bladder is associated with poor nerve signals between your bladder and your brain. Your bladder may get the signal to empty before it is full. You may also have very sensitive muscles that make your bladder squeeze too soon. These symptoms might interfere with daily work or social activities. What are the causes? This condition may be associated with or caused by:  Urinary tract infection.  Infection of nearby tissues, such as the prostate.  Prostate enlargement.  Surgery on the uterus or urethra.  Bladder stones, inflammation, or tumors.  Drinking too much caffeine or alcohol.  Certain medicines, especially medicines that get rid of extra fluid in the body (diuretics).  Muscle or nerve weakness, especially from: ? A spinal cord injury. ? Stroke. ? Multiple sclerosis. ? Parkinson's disease.  Diabetes.  Constipation. What increases the risk? You may be at greater risk for overactive bladder if you:  Are an older adult.  Smoke.  Are going through menopause.  Have prostate problems.  Have a neurological disease, such as stroke, dementia, Parkinson's disease, or multiple sclerosis (MS).  Eat or drink things that irritate the bladder. These include alcohol, spicy food, and caffeine.  Are overweight or obese. What are the signs or symptoms? Symptoms of this condition include:  Sudden, strong urge to urinate.  Leaking urine.  Urinating 8 or more times a day.  Waking up to urinate 2  or more times a night. How is this diagnosed? Your health care provider may suspect overactive bladder based on your symptoms. He or she will diagnose this condition by:  A physical exam and medical history.  Blood or urine tests. You might need bladder or urine tests to help determine what is causing your overactive bladder. You might also need to see a health care provider who specializes in urinary tract problems (urologist). How is this treated? Treatment for overactive bladder depends on the cause of your condition and whether it is mild or severe. You can also make lifestyle changes at home. Options include:  Bladder training. This may include: ? Learning to control the urge to urinate by following a schedule that directs you to urinate at regular intervals (timed voiding). ? Doing Kegel exercises to strengthen your pelvic floor muscles, which support your bladder. Toning these muscles can help you control urination, even if your bladder muscles are overactive.  Special devices. This may include: ? Biofeedback, which uses sensors to help you become aware of your body's signals. ? Electrical stimulation, which uses electrodes placed inside the body (implanted) or outside the body. These electrodes send gentle pulses of electricity to strengthen the nerves or muscles that control the bladder. ? Women may use a plastic device that fits into the vagina and supports the bladder (pessary).  Medicines. ? Antibiotics to treat bladder infection. ? Antispasmodics to stop the bladder from releasing urine at the wrong time. ? Tricyclic antidepressants to relax bladder muscles. ? Injections of botulinum toxin type A directly into the bladder  tissue to relax bladder muscles.  Lifestyle changes. This may include: ? Weight loss. Talk to your health care provider about weight loss methods that would work best for you. ? Diet changes. This may include reducing how much alcohol and caffeine you  consume, or drinking fluids at different times of the day. ? Not smoking. Do not use any products that contain nicotine or tobacco, such as cigarettes and e-cigarettes. If you need help quitting, ask your health care provider.  Surgery. ? A device may be implanted to help manage the nerve signals that control urination. ? An electrode may be implanted to stimulate electrical signals in the bladder. ? A procedure may be done to change the shape of the bladder. This is done only in very severe cases. Follow these instructions at home: Lifestyle  Make any diet or lifestyle changes that are recommended by your health care provider. These may include: ? Drinking less fluid or drinking fluids at different times of the day. ? Cutting down on caffeine or alcohol. ? Doing Kegel exercises. ? Losing weight if needed. ? Eating a healthy and balanced diet to prevent constipation. This may include:  Eating foods that are high in fiber, such as fresh fruits and vegetables, whole grains, and beans.  Limiting foods that are high in fat and processed sugars, such as fried and sweet foods. General instructions  Take over-the-counter and prescription medicines only as told by your health care provider.  If you were prescribed an antibiotic medicine, take it as told by your health care provider. Do not stop taking the antibiotic even if you start to feel better.  Use any implants or pessary as told by your health care provider.  If needed, wear pads to absorb urine leakage.  Keep a journal or log to track how much and when you drink and when you feel the need to urinate. This will help your health care provider monitor your condition.  Keep all follow-up visits as told by your health care provider. This is important. Contact a health care provider if:  You have a fever.  Your symptoms do not get better with treatment.  Your pain and discomfort get worse.  You have more frequent urges to  urinate. Get help right away if:  You are not able to control your bladder. Summary  Overactive bladder refers to a condition in which a person has a sudden need to pass urine.  Several conditions may lead to an overactive bladder.  Treatment for overactive bladder depends on the cause and severity of your condition.  Follow your health care provider's instructions about lifestyle changes, doing Kegel exercises, keeping a journal, and taking medicines. This information is not intended to replace advice given to you by your health care provider. Make sure you discuss any questions you have with your health care provider. Document Revised: 04/25/2018 Document Reviewed: 01/18/2017 Elsevier Patient Education  [place].

## 2019-07-01 NOTE — Progress Notes (Signed)
07/01/19 4:21 PM   Ronald Wallace [birth year] [id number]  CC: Incontinence and abdominal pain  HPI: I saw [name] today for evaluation of abdominal pain and urinary incontinence.  He reports 5 to 6 months of worsening urinary symptoms of urgency, frequency, and urge incontinence.  He denies any UTI or history of retention.  He denies any gross hematuria.  He is on maximal medical therapy with Flomax and finasteride.  He was seen yesterday by his PCP with pelvic pain and thought to be in urinary retention so a bladder ultrasound was ordered.  This showed a normal bladder with only 48 mL, normal ureteral jets bilaterally, and a small 26 g prostate.  His primary issue today is 1 week of lower abdominal pelvic pain that radiates up to the left upper quadrant.  There is no other recent cross-sectional imaging to review.  He denies any fevers or chills.  Urinalysis 06/27/2019 completely benign.  Bladder scan normal at 94 mL  PMH: Past Medical History:  Diagnosis Date  . Anemia   . Anxiety   . Arthritis   . Bradycardia   . CAD (coronary artery disease)    a. 02/2014 Neg Ex MV, EF 65%;  b. 06/2015 Abnl MV ([facility])-->Cath ([facility]): LM nl, LAD 30p/m, LCX 30p, OM2 50p, RCA 100p w/ L-->R collats, nl EF-->Med Rx; c. 10/2017 MV: No isch/scar; d. 11/2017 Cath: LM nl, LAD 45p/m, LCX 30ost, OM2 30, RCA 100 CTO, L->R collats. PA 31/16/22, PCWP 11.  . CKD (chronic kidney disease)   . Depression   . Diabetes mellitus, type II, insulin dependent ([place])    With neuropathy  . Diverticulitis   . Essential hypertension   . Hyperlipidemia   . Neuropathy   . Obesity   . Osteoporosis   . Pure hypercholesterolemia   . Shortness of breath dyspnea   . Syncope and collapse    a. 02/2014 Carotid Dopplers: 1-39% bilateral ICA with normal Subclavian & vertebral arteries; b. 02/2014 2 D Echo: EF 50-55%, Gr 1 DD, Mild MR & LA dilation -- was bradycardic in 40s; c. 04/2014 Event monitor: RSR/SA/SB. Occas PVC's. No  AFib.    Surgical History: Past Surgical History:  Procedure Laterality Date  . CARDIAC CATHETERIZATION Left 07/07/2015   Procedure: Left Heart Cath and Coronary Angiography;  Surgeon: Isaias Cowman, MD;  Location: [facility];  Service: Cardiovascular;  Laterality: Left;  . CARPAL TUNNEL RELEASE    . CATARACT EXTRACTION Right   . CATARACT EXTRACTION W/PHACO Left 10/06/2015   Procedure: CATARACT EXTRACTION PHACO AND INTRAOCULAR LENS PLACEMENT (IOC);  Surgeon: Estill Cotta, MD;  Location: [facility];  Service: Ophthalmology;  Laterality: Left;  [place] 01:42 AP% 25.1 CDE 45.92 Fluid pack lot # [id number]  . COLONOSCOPY    . COLONOSCOPY WITH PROPOFOL N/A 12/04/2018   Procedure: COLONOSCOPY WITH PROPOFOL;  Surgeon: Toledo, Benay Pike, MD;  Location: [facility];  Service: Gastroenterology;  Laterality: N/A;  . NM MYOVIEW LTD  03/13/2014   LOW RISK - positive EKG but negative imaging: 2 mm ST Depression in V4 & V5 but No scintigraphic evidence of Ischemia or Infarct o  . RIGHT/LEFT HEART CATH AND CORONARY ANGIOGRAPHY N/A 11/21/2017   Procedure: RIGHT/LEFT HEART CATH AND CORONARY ANGIOGRAPHY;  Surgeon: Minna Merritts, MD;  Location: [facility];  Service: Cardiovascular;  Laterality: N/A;  . TRANSTHORACIC ECHOCARDIOGRAM  03/12/2014   EF 50-55%, Gr 1 DD, no RWMA, Mlid MR & LA dil, Normla RV and  bradycardic 40s -    Family History: Family History  Problem Relation Age of Onset  . Heart attack Mother   . Breast cancer Mother   . Lung cancer Father     Social History:  reports that he quit smoking about 56 years ago. His smoking use included pipe. He quit after 1.00 year of use. He has never used smokeless tobacco. He reports that he does not drink alcohol and does not use drugs.  Physical Exam: BP 120/63   Pulse (!) 55   Ht 5\' 7"  (1.702 m)   Wt 200 lb (90.7 kg)   BMI 31.32 kg/m    Constitutional:  Alert and oriented, No acute distress. Cardiovascular: No  clubbing, cyanosis, or edema. Respiratory: Normal respiratory effort, no increased work of breathing. GI: Abdomen is distended, mild to moderately tender throughout, non-peritonitic  GU: Phallus with widely patent meatus testicles 20 cc and descended bilaterally DRE: 30 g prostate, smooth, no nodules  Laboratory Data: Reviewed, see HPI  Pertinent Imaging: Reviewed, see HPI  Assessment & Plan:   In summary, is a 79 year old male with BPH and 5 to 6 months of worsening urinary symptoms including urgency, frequency, and urge incontinence.  Urinalysis is benign and PVR is normal.  He also has 1 week of abdominal and pelvic pain of unclear etiology.  I suspect whatever is causing his abdominal pain may be accentuating his bladder symptoms.  I recommended an abdominal x-ray today, and recommended close follow-up with his PCP regarding his abdominal pain and abdominal distention.  We discussed return precautions to the ED including fevers or worsening abdominal pain, that would warrant urgent CT scan.  Trial of beta 3 agonist regarding OAB symptoms, samples given We will call with AXR results RTC 4 to 6 weeks for symptom check  Ronald Madrid, MD 07/01/2019  [facility] [address], [place], [place] [address] [phone]

## 2019-07-02 ENCOUNTER — Ambulatory Visit
Admission: RE | Admit: 2019-07-02 | Discharge: 2019-07-02 | Disposition: A | Discharge status: Home / Self Care | Payer: PPO | Attending: Urology | Admitting: Urology

## 2019-07-02 ENCOUNTER — Ambulatory Visit
Admission: RE | Admit: 2019-07-02 | Discharge: 2019-07-02 | Disposition: A | Discharge status: Home / Self Care | Payer: PPO | Source: Ambulatory Visit | Attending: Urology | Admitting: Urology

## 2019-07-02 DIAGNOSIS — R1084 Generalized abdominal pain: Secondary | ICD-10-CM | POA: Diagnosis not present

## 2019-07-02 DIAGNOSIS — R109 Unspecified abdominal pain: Secondary | ICD-10-CM | POA: Diagnosis not present

## 2019-07-02 DIAGNOSIS — M5126 Other intervertebral disc displacement, lumbar region: Secondary | ICD-10-CM | POA: Diagnosis not present

## 2019-07-02 DIAGNOSIS — M5416 Radiculopathy, lumbar region: Secondary | ICD-10-CM | POA: Diagnosis not present

## 2019-07-03 ENCOUNTER — Telehealth: Payer: Self-pay

## 2019-07-03 ENCOUNTER — Other Ambulatory Visit: Payer: Self-pay | Admitting: Family Medicine

## 2019-07-03 DIAGNOSIS — R1031 Right lower quadrant pain: Secondary | ICD-10-CM

## 2019-07-03 NOTE — Telephone Encounter (Signed)
I think he has a bowel issue not a urology issue- I would recommend he see his PCP to see what they would like to order ie with oral or IV contrast, thanks  [name], MD 07/03/2019

## 2019-07-03 NOTE — Telephone Encounter (Signed)
Patient's wife called Triage line-informed patient of results. She was very appreciative of your time and listening. Wanted to know if he could go ahead and get CT scheduled prior to follow up with you? Pain still ongoing but has not worsened.

## 2019-07-03 NOTE — Telephone Encounter (Signed)
Discussed with patient's wife, voiced understanding.

## 2019-07-03 NOTE — Telephone Encounter (Signed)
-----   Message from Billey Co, MD sent at 07/03/2019  7:33 AM EDT ----- No obvious explanation for his abdominal pain on KUB, keep follow up as scheduled. If abdominal pain worsens recommend see PCP or urgent care for likely CT  [name], MD 07/03/2019

## 2019-07-04 ENCOUNTER — Ambulatory Visit (HOSPITAL_COMMUNITY)
Admission: RE | Admit: 2019-07-04 | Discharge: 2019-07-04 | Disposition: A | Discharge status: Home / Self Care | Payer: PPO | Source: Ambulatory Visit | Attending: Neurology | Admitting: Neurology

## 2019-07-04 ENCOUNTER — Other Ambulatory Visit: Payer: Self-pay

## 2019-07-04 DIAGNOSIS — R930 Abnormal findings on diagnostic imaging of skull and head, not elsewhere classified: Secondary | ICD-10-CM | POA: Diagnosis not present

## 2019-07-04 DIAGNOSIS — G3184 Mild cognitive impairment, so stated: Secondary | ICD-10-CM | POA: Diagnosis not present

## 2019-07-04 DIAGNOSIS — R4189 Other symptoms and signs involving cognitive functions and awareness: Secondary | ICD-10-CM | POA: Diagnosis not present

## 2019-07-09 ENCOUNTER — Telehealth: Payer: Self-pay | Admitting: Neurology

## 2019-07-09 NOTE — Telephone Encounter (Signed)
Patient's wife called to report the patient had his MRI done on 07/04/19. She'd like to know Dr. Amparo Bristol thoughts on the MRI even though she is not the doctor who ordered it.

## 2019-07-10 NOTE — Telephone Encounter (Signed)
Patient's daughter called about the MRI results again.

## 2019-07-11 NOTE — Telephone Encounter (Signed)
Spoke to daughter about MRI results, there is moderate chronic microvascular disease, and also the whole brain volume is 2nd percentile. Not clearly diagnostic one way or another, I think the Neurocognitive testing would also provide additional information. Dtr concerned about driving, discussed Neurocognitive testing may be helpful, but on-road testing would be more helpful if Neuropsych is not clearly bad on this front. She is reporting more incontinence, both urinary and bowel, ?diarrhea. Asking if this is due to the vascular changes, I discussed with her that other causes need to be ruled out first before attributing all of this to his brain. Diarrhea may also be a side effect of the Aricept, advised to hold medication and see if symptoms resolve, we can try a different medication (Namenda). Proceed with Neuropsych as scheduled, all questions were answered.

## 2019-07-14 ENCOUNTER — Telehealth: Payer: Self-pay

## 2019-07-14 NOTE — Telephone Encounter (Signed)
Patient's wife called wanting clarification on what the samples given at last visit were for. It was explained that patient was given samples of Gemtiza for OAB, patient is to take one tablet daily and follow up in 4-6wk for symptom recheck. She verbalized understanding but states that patient has misplaced samples and only can find one bottle. More samples were left up front for patient to pick up

## 2019-07-18 ENCOUNTER — Ambulatory Visit
Admission: RE | Admit: 2019-07-18 | Discharge: 2019-07-18 | Disposition: A | Discharge status: Home / Self Care | Payer: PPO | Source: Ambulatory Visit | Attending: Family Medicine | Admitting: Family Medicine

## 2019-07-18 ENCOUNTER — Other Ambulatory Visit: Payer: Self-pay

## 2019-07-18 DIAGNOSIS — R1031 Right lower quadrant pain: Secondary | ICD-10-CM | POA: Diagnosis not present

## 2019-07-18 DIAGNOSIS — M5386 Other specified dorsopathies, lumbar region: Secondary | ICD-10-CM | POA: Diagnosis not present

## 2019-07-18 DIAGNOSIS — K5730 Diverticulosis of large intestine without perforation or abscess without bleeding: Secondary | ICD-10-CM | POA: Diagnosis not present

## 2019-07-18 DIAGNOSIS — R1032 Left lower quadrant pain: Secondary | ICD-10-CM | POA: Insufficient documentation

## 2019-07-18 DIAGNOSIS — R600 Localized edema: Secondary | ICD-10-CM | POA: Diagnosis not present

## 2019-07-18 DIAGNOSIS — I700 Atherosclerosis of aorta: Secondary | ICD-10-CM | POA: Diagnosis not present

## 2019-07-18 MED ORDER — IOHEXOL 300 MG/ML  SOLN
75.0000 mL | Freq: Once | INTRAMUSCULAR | Status: AC | PRN
  Administered 2019-07-18: 75 mL via INTRAVENOUS

## 2019-07-23 ENCOUNTER — Encounter: Payer: Self-pay | Admitting: Counselor

## 2019-07-23 ENCOUNTER — Other Ambulatory Visit: Payer: Self-pay

## 2019-07-23 ENCOUNTER — Ambulatory Visit (INDEPENDENT_AMBULATORY_CARE_PROVIDER_SITE_OTHER): Payer: PPO | Admitting: Counselor

## 2019-07-23 DIAGNOSIS — F418 Other specified anxiety disorders: Secondary | ICD-10-CM | POA: Diagnosis not present

## 2019-07-23 DIAGNOSIS — G319 Degenerative disease of nervous system, unspecified: Secondary | ICD-10-CM

## 2019-07-23 DIAGNOSIS — F09 Unspecified mental disorder due to known physiological condition: Secondary | ICD-10-CM | POA: Diagnosis not present

## 2019-07-23 DIAGNOSIS — I6781 Acute cerebrovascular insufficiency: Secondary | ICD-10-CM
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
## 2019-07-24 ENCOUNTER — Telehealth: Payer: Self-pay | Admitting: Neurology

## 2019-07-24 DIAGNOSIS — E1169 Type 2 diabetes mellitus with other specified complication: Secondary | ICD-10-CM | POA: Diagnosis not present

## 2019-07-24 DIAGNOSIS — E785 Hyperlipidemia, unspecified: Secondary | ICD-10-CM | POA: Diagnosis not present

## 2019-07-24 DIAGNOSIS — E1159 Type 2 diabetes mellitus with other circulatory complications: Secondary | ICD-10-CM | POA: Diagnosis not present

## 2019-07-24 DIAGNOSIS — I152 Hypertension secondary to endocrine disorders: Secondary | ICD-10-CM | POA: Diagnosis not present

## 2019-07-24 NOTE — Progress Notes (Signed)
[facility]  Patient Name: Ronald Wallace MRN: [id number] Date of Birth: 02/05/1940 Age: 79 y.o. Education: 14 years  Measurement properties of test scores: IQ, Index, and Standard Scores (SS): Mean = 100; Standard Deviation = 15 Scaled Scores (Ss): Mean = 10; Standard Deviation = 3 Z scores (Z): Mean = 0; Standard Deviation = 1 T scores (T); Mean = 50; Standard Deviation = 10  TEST SCORES:    Note: This summary of test scores accompanies the interpretive report and should not be interpreted by unqualified individuals or in isolation without reference to the report. Test scores are relative to age, gender, and educational history as available and appropriate.   Performance Validity        "A" Random Letter Test Raw  Descriptor      Errors 0 Within Expectation  The Dot Counting Test: 14 Within Expectation      Mental Status Screening     Total Score Descriptor  MMSE 23 Mild Dementia      Expected Functioning        Wide Range Achievement Test: Standard/Scaled Score Percentile      Word Reading 85 16      Attention/Processing Speed        Neuropsychological Assessment Battery (Attention Module, Form 1): T-score Percentile      Digits Forward 34 5      Digits Backwards 30 2      Repeatable Battery for the Assessment of Neuropsychological Status (Form A): Scaled Score Percentile      Coding 4 2      Language        Neuropsychological Assessment Battery (Language Module, Form 1): T-score Percentile      Naming   (24) 28 2      Verbal Fluency: T-score Percentile      Controlled Oral Word Association (F-A-S) 37 9      Semantic Fluency (Animals) 46 34      Memory:        Neuropsychological Assessment Battery (Memory Module, Form 1): T-score Percentile      List Learning           List A Immediate Recall   (3, 5, 4) 35 7         List B Immediate Recall   (4) 53 62         List A Short Delayed Recall   (2) 32 4         List A  Long Delayed Recall   (2) 34 5         List A Percent Retention   (100 %) --- 54         List A Long Delayed Yes/No Recognition Hits   (8) --- 8         List A Long Delayed Yes/No Recognition False Alarms   (5) --- 50         List A Recognition Discriminability Index --- 21     Story Learning           Immediate Recall   (12, 24) 34 5         Delayed Recall   (16) 35 7         Percent Retention   (67 %) --- 27      Daily Living Memory            Immediate Recall   (16, 9) 32 4  Delayed Recall   (2, 0) 19 <1          Percent Retention (18 %) --- <1          Recognition Hits    (8) --- 42      Repeatable Battery for the Assessment of Neuropsychological Status (Form A): Scaled Score Percentile         Figure Recall   (2) 3 1      Visuospatial/Constructional Functioning        Repeatable Battery for the Assessment of Neuropsychological Status (Form A): Standard/Scaled Score Percentile     Visuospatial/Constructional Index 69 2         Figure Copy   (15) 5 5         Judgment of Line Orientation   (11) --- 3-9      Executive Functioning        Modified [employer] Test (MWCST): Standard/T-Score Percentile      Number of Categories Correct 34 6      Number of Perseverative Errors 40 16      Number of Total Errors 69 98      Percent Perseverative Errors 67 96  Executive Function Composite 102 56      Trail Making Test: T-Score Percentile      Part A 28 2      Part B 19 <1      [place] Diagnostic Aphasia Exam: Raw Score Scaled Score      Complex Ideational Material 9 5      Clock Drawing Raw Score Descriptor      Command 6 "Mild Impairment"      Rating Scales        Quick Dementia Rating System Raw Score Descriptor      Sum of Boxes 4.5 Mild Dementia      Total Score 8 Mild Dementia  Geriatric Depression Scale - Short Form 10 Depressed   Macedonio Scallon V. Nicole Kindred PsyD, [place] Clinical Neuropsychologist

## 2019-07-24 NOTE — Telephone Encounter (Signed)
Pts daughter called to ask about test pt had with Dr. Nicole Kindred. She felt like Dr. Les Pou personality didn't match with their family and with patient. Per patients daughter, they didn't like his tone and he upset the patient, his wife and daughter. They felt like he was rude. Daughter stated that "my dads sugar dropped and the alarm went off, Dr. Nicole Kindred walked him into the lobby and told him to have a seat until he was okay." They would like to know if they could receive the results from the other neuropsychologist or if Dr. Delice Lesch could review the results with them.  Spoke with Glass blower/designer, Hoyle Sauer. She will speak with Dr. Melvyn Novas and Dr. Nicole Kindred on how to proceed.

## 2019-07-25 DIAGNOSIS — D638 Anemia in other chronic diseases classified elsewhere: Secondary | ICD-10-CM | POA: Diagnosis not present

## 2019-07-25 DIAGNOSIS — D225 Melanocytic nevi of trunk: Secondary | ICD-10-CM | POA: Diagnosis not present

## 2019-07-25 DIAGNOSIS — D2262 Melanocytic nevi of left upper limb, including shoulder: Secondary | ICD-10-CM | POA: Diagnosis not present

## 2019-07-25 DIAGNOSIS — L570 Actinic keratosis: Secondary | ICD-10-CM | POA: Diagnosis not present

## 2019-07-25 DIAGNOSIS — Z0000 Encounter for general adult medical examination without abnormal findings: Secondary | ICD-10-CM | POA: Diagnosis not present

## 2019-07-25 DIAGNOSIS — E1159 Type 2 diabetes mellitus with other circulatory complications: Secondary | ICD-10-CM | POA: Diagnosis not present

## 2019-07-25 DIAGNOSIS — C44519 Basal cell carcinoma of skin of other part of trunk: Secondary | ICD-10-CM | POA: Diagnosis not present

## 2019-07-25 DIAGNOSIS — L821 Other seborrheic keratosis: Secondary | ICD-10-CM | POA: Diagnosis not present

## 2019-07-25 DIAGNOSIS — D485 Neoplasm of uncertain behavior of skin: Secondary | ICD-10-CM | POA: Diagnosis not present

## 2019-07-25 DIAGNOSIS — F329 Major depressive disorder, single episode, unspecified: Secondary | ICD-10-CM | POA: Diagnosis not present

## 2019-07-25 DIAGNOSIS — E669 Obesity, unspecified: Secondary | ICD-10-CM | POA: Diagnosis not present

## 2019-07-25 DIAGNOSIS — D044 Carcinoma in situ of skin of scalp and neck: Secondary | ICD-10-CM | POA: Diagnosis not present

## 2019-07-25 DIAGNOSIS — D2272 Melanocytic nevi of left lower limb, including hip: Secondary | ICD-10-CM | POA: Diagnosis not present

## 2019-07-25 DIAGNOSIS — E785 Hyperlipidemia, unspecified: Secondary | ICD-10-CM | POA: Diagnosis not present

## 2019-07-25 DIAGNOSIS — D2261 Melanocytic nevi of right upper limb, including shoulder: Secondary | ICD-10-CM | POA: Diagnosis not present

## 2019-07-25 DIAGNOSIS — E1169 Type 2 diabetes mellitus with other specified complication: Secondary | ICD-10-CM | POA: Diagnosis not present

## 2019-07-25 DIAGNOSIS — F419 Anxiety disorder, unspecified: Secondary | ICD-10-CM | POA: Diagnosis not present

## 2019-07-25 DIAGNOSIS — I152 Hypertension secondary to endocrine disorders: Secondary | ICD-10-CM | POA: Diagnosis not present

## 2019-07-25 DIAGNOSIS — Z85828 Personal history of other malignant neoplasm of skin: Secondary | ICD-10-CM | POA: Diagnosis not present

## 2019-07-25 DIAGNOSIS — X32XXXA Exposure to sunlight, initial encounter: Secondary | ICD-10-CM | POA: Diagnosis not present

## 2019-07-25 DIAGNOSIS — N183 Chronic kidney disease, stage 3 unspecified: Secondary | ICD-10-CM | POA: Diagnosis not present

## 2019-07-25 NOTE — Progress Notes (Signed)
[facility]  Patient Name: Ronald Wallace MRN: [id number] Date of Birth: December 26, 1940 Age: 79 y.o. Education: 14 years  Clinical Impressions  Results from neuropsychological testing must be interpreted cautiously, as Ronald Wallace was tearful and appeared visibly upset through much of the evaluation. He also demonstrated low-average single word reading, which tempers expectations for his test performance. The findings show low scores in most domains assessed including on measures of attention and working memory, processing speed, visuospatial/constructional function, and on select language measures including visual object confrontation naming and phonemic verbal fluency. Interestingly, he did not show clear memory storage problems and instead had notable encoding issues and relatively preserved retention of information across time on several tasks. Further obfuscating things, his naming problems reflected misperception errors as much as they did semantic paraphasia or true naming deficits. As such, the findings are viewed as nonspecific. He did screen positive for the presence of depression, which is consistent with his presentation. On severity status rating, his wife characterized him as functioning at a mild dementia level.   With regard to Ronald Wallace' volumetric findings, the likelihood ratio for distinguishing normal controls from individuals with Alzheimer's dementia using hippocampal volume loss alone is in the neighborhood of 3.95 (e.g., Ledig et al., 2018). Assuming a disease prevalence of 20%, the post-test probability of him having Alzheimer's dementia is 48% (95% confidence interval: 44 - 53%). That probability rises to 62% assuming a 30% prevalence rate (95% confidence interval: 57% - 66%). The ability of volumetric data to discriminate mild cognitive impairment due to Alzheimer's disease from dementia due to Alzheimer's disease is substantially lower.  Therefore, I do not find the data particularly compelling for the presence of dementia, although they do provide at least some support for an impression of Alzheimer's disease as an underlying etiology.  Ronald Wallace is thus demonstrating at least a mild cognitive impairment level problem and may have dementia but I am not sure he meets the functional impairment criterion given his family's report. Moreover, his family report noticing changes only over the past several months, which is unexpected if he has been working on a dementia level problem due to a slowly progressive etiology. For these reasons, mild cognitive impairment due to an as yet unclear etiology presents as the best diagnosis. In addition to any underlying conditions, vascular disease, interference from depression, anxiety, and his learning history may also be contributory (e.g., has never been good with medications and now has many to manage).   Diagnostic Impressions: Mild cognitive impairment Depression with anxiety   References: Ronald Wallace, A., Ronald Wallace, R. et al. [phone]). Structural brain imaging in Alzheimers disease and mild cognitive impairment: biomarker analysis and shared morphometry database. Nature: Ronald Wallace, 8, 11258 . [email or web address]  Recommendations   Your performance and presentation on assessment were consistent with low scores in most areas. Those data need to be taken with a grain of salt, given that you were upset and tearful during the majority of the evaluation.You actually did fairly well retaining information across time , which is the hallmark deficit we typically see in Alzheimer's, and therefore your test data are somewhat at odds with your neuroimaging, rendering the diagnostic picture unclear. At a minimum I am confident you do have some level of cognitive impairment but your family reported that there is really nothing you were previously able to do that you cannot do  now and therefore, I think that the best diagnosis at this point in  time is Mild Cognitive Impairment.   The major difference between mild cognitive impairment (MCI) and dementia is in severity and potential prognosis. Once someone reaches a level of severity adequate to be diagnosed with a dementia, there is usually progression over time, though this may be years. On the other hand, mild cognitive impairment, while a significant risk for dementia in future, does not always progress to dementia, and in some instances stays the same or can even revert to normal. It is important to realize that if MCI is due to underlying Alzheimer's disease, it will most likely progress to dementia eventually. The rate of conversion to Alzheimer's dementia from amnestic MCI is about 15% per year versus the general population risk of conversion of 2% per year.   There is a major difference between Alzheimer's disease and Alzheimer's dementia. Alzheimer's disease is a disease process that starts in the brain perhaps a decade or more before individuals have clinical symptoms. In some studies, up to 60% of individuals over the age of 24 have Alzheimer's disease in their brain. Far fewer of them manifest a dementia syndrome. By contrast, Alzheimer's dementia refers to the clinical syndrome caused by Alzheimer's disease (that is, memory and thinking problems that are significant enough so as to undermine that individual's capacity for independence).   Your neuroimaging lends some support for the idea that Alzheimer's disease is the cause of your problem but is not very informative as to whether you have alzheimer's dementia. Your test data also are atypical for alzheimer's dementia. Therefore, I am unsure what is causing your problem but would suggest that you return for re-evaluation in one year, get the help you need in the meantime, and treat your depression and anxiety, which may be the biggest factors affecting your quality of  life.     There is research that managing insulin is challenging even for individuals with average intellectual ability and no cognitive impairment. Given that you have had some difficulties adequately managing your insulin and that it is critically important for you to do so correctly, I would recommend that you continue to get assistance from your wife or paid supports with managing your insulin and medications as needed.   I suggest that you consult with a psychiatrist or another prescribing provider regarding your depression and anxiety, which do not appear to be well controlled. You might also consider psychotherapy, which could be helpful for helping you process some of the things making you upset, teaching you more adaptive ways to think about the things that trouble you, and learning effective coping skills.   I would recommend that you institute healthy lifestyle changes including regular exercise (ideally 30 minutes of moderate exercise 5 times a week) and a heart healthy, brain healthy diet such as the MIND-DASH diet to promote cognitive longevity. I would caution you to consult with your medical treatment providers before beginning exercise if there are concerns that it may not be safe due to your cardiac issues.   As for driving, you reported that you have been doing fine since you got lost. Some individuals with mild cognitive impairment are safe to drive whereas others are not. If there are concerns about your ability to drive, I suggest that you undergo an on the road driving assessment to see if it is safe. [facility] [employer] in [place] or [place] [place] [phone] are two options in the area that you may wish to consider. You should note that this test is  typically not covered by insurance and can cost $500 dollars.   Some cognitive abilities (such as processing speed) naturally decline with age, but there are many things you can do to contribute  to healthy cognitive aging. There is evidence from at least one study that a modified low carbohydrate mediterranean diet (the MIND-DASH) diet can contribute to healthy cognitive aging. There is also some evidence to suggest a beneficial effect of coffee (black coffee without sugar or other additives such as cream) for healthy aging. One glass of red wine a day may also contribute to healthy aging though at two drinks you lose all benefit and at three drinks it may be doing more harm than good. Staying active, mentally and physically, are also crucially important. One of the best ways to do this is simply to stay active and engaged in your life, particularly with social activities. Challenging the mind and other cognitively stimulating activities are encouraged, consider learning a new hobby, reconnecting with old friends, reading an interesting and thought provoking book. It is not so much what you do that is important as it is that you enjoy it and stay at it.   Test Findings  Test scores are summarized in additional documentation associated with this encounter. Test scores are relative to age, gender, and educational history as available and appropriate. There were some concerns about performance validity given that the patient appeared tearful and upset during much of the testing and also had a tendency to give up easily on cognitive tests. He also performed quite poorly on several easy tests that can be viewed as validity measures while performing within expectations on two different standalone measures. Accordingly, findings are interpreted cautiously.    General Intellectual Functioning/Achievement:  Performance was in the low average range on single word reading, which tempers expectations for cognitive performance to some extent.   Attention and Processing Efficiency: Performance on indicators of attention and working memory was weak with an unusually low score for digit repetition forward and a  comparable unusually low score for digit repetition backward.   On timed indicators of processing efficiency, he performed at an extremely low level, including on one very easy measure that can be viewed as a validity indicator.   Language: Language findings showed extremely low visual object confrontation naming; however, many of his errors were due to misperception problems (e.g., mistook a bowl of popcorn for oatmeal, thought a cherry was an apple but then corrected himself) so the significance of this low score is unclear. He performed at a weak low average level on generation of words in response to given letters and an average level on generation of animals in one minute.   Visuospatial Function: Performance on visuospatial and constructional measures was extremely low at an index level. Copy of a complex figure and judgment of angular line orientations were both unusually low.   Learning and Memory: Performance on learning and memory measures was below expectations; however, the profile shows diminished encoding of information with relatively good retention across time and/or intact recognition performance and as such is not clearly convincing for a storage problem.   In the verbal realm, his immediate recall for a 12-item word list was 3, 5, and 4 words of a 12-item word list across three learning trials followed by 2 words at short and long-delayed recall, which is unusually low. He correctly identified 8 words with 5 false positive erorrs for a discriminability index in the low average range on recognition testing. Immediate  recall for a short story was unusually low at immediate recall followed by comparable unusually low delayed free recall. Once again, his retention of information across time was good. Memory for brief daily living information was unusually low and this time, delayed recall was extremely low, although he also became tearful during the performance of this test and that  seemed to affect his focus. Delayed recognition was better and fell at an average level.  In the visual realm, delayed recall of a modestly complex geometric figure was extremely low.   Executive Functions: Performance was variable on executive measures with an overall impression of some diminishment. Alternating sequencing of numbers and letters of the alphabet was extremely low. Generation of words in response to the letters F-A-S was low average. His clock drawing was suggestive of mild impairment with errors in number placement and sloppy formation of the hands (although the hands were grossly correctly placed). His performance on the Modified [employer] Test was average on the executive function composite, albeit with a very good perseverative errors score. Categories completed was unusually low.   Rating Scale(s): Mr. Kruse' wife characterized him as functioning at a mild dementia level on severity status staging, although that contrasts somewhat from her report during the interview, which is more consistent with an MCI level problem. He screened positive for the presence of depression.   Viviano Simas Nicole Kindred PsyD, [place] [facility] Neuropsychologist

## 2019-07-31 ENCOUNTER — Telehealth: Payer: Self-pay

## 2019-07-31 ENCOUNTER — Encounter: Payer: PPO | Admitting: Counselor

## 2019-07-31 MED ORDER — GEMTESA 75 MG PO TABS: 1.0000 | ORAL_TABLET | Freq: Every day | ORAL | 11 refills | Status: DC

## 2019-07-31 NOTE — Telephone Encounter (Signed)
Patient's wife called stating that the Gemtesa samples are working well and would like a script sent into patient's pharmacy. Script was sent

## 2019-08-01 ENCOUNTER — Ambulatory Visit: Payer: PPO | Admitting: Neurology

## 2019-08-01 ENCOUNTER — Other Ambulatory Visit: Payer: Self-pay

## 2019-08-01 ENCOUNTER — Encounter: Payer: Self-pay | Admitting: Neurology

## 2019-08-01 VITALS — BP 126/55 | HR 57 | Ht 67.0 in | Wt 201.8 lb

## 2019-08-01 DIAGNOSIS — F418 Other specified anxiety disorders: Secondary | ICD-10-CM

## 2019-08-01 DIAGNOSIS — G3184 Mild cognitive impairment, so stated: Secondary | ICD-10-CM

## 2019-08-01 NOTE — Progress Notes (Signed)
NEUROLOGY FOLLOW UP OFFICE NOTE  Ronald Wallace [id number] 1940/05/25  HISTORY OF PRESENT ILLNESS: I had the pleasure of seeing Ronald Wallace in follow-up in the neurology clinic on 08/01/2019.  The patient was last seen a month ago for worsening memory. He is again accompanied by his wife and daughter who help supplement the history today. They present to discuss results of recent testing. B12 level was low normal 239, they were advised to start a B12 supplement, he has not started this yet. I personally reviewed MRI brain without contrast done 06/2019 which did not show any acute changes. There was moderate chronic microvascular disease, diffuse volume loss with whole brain volume at 2nd percentile. He had Neuropsychological testing done earlier this month, it was noted that testing must be interpreted cautiously as he was tearful and visibly upset throughout much of the evaluation. He did not show clear memory storage prolbmes and instead had notable encoding issues and relatively preserved retention of information across time on several tasks. He screened positive for depression. Diagnosis was Mild Cognitive Impairment, etiology unclear, possibly mixed AD and vascular, with interference from depression and anxiety.   He and his family expressed their frustration with recent Neuropsych testing, there appears to have been a difference in personalities and the patient felt slighted and belittled. His daughter states he was already nervous about the test, so his feelings made the testing even harder on him. I discussed with them that he did not do quite bad on the testing, discussed the diagnosis of MCI. We discussed how mood and glucose levels can also affect cognition. He and family have not noticed much mood improvement with initiation of Lexapro. No paranoia or hallucinations. They deny any diarrhea, Vibegron has helped with urinary issues at night. His glucose level was 58 at the beginning of the  visit, he felt better after given ginger ale. Last week he switched his day and night insulin doses, his wife fills his pillbox.    Laboratory Data:  Lab Results  Component Value Date   [id number] 06/18/2019    History on Initial Assessment 06/18/2019: This is a 79 year old right-handed man with a history of hypertension, hyperlipidemia, diabetes, CAD, CKD, bradycardia, anxiety, depression, presenting for evaluation of memory loss. His daughter,as well as wife on speakerphone, are present to provide additional information. Records were reviewed. He was seen 6 days ago at [facility] by neurologist Dr. Manuella Ghazi, however they would still like to proceed with visit today for the same concern. SLUMS score was 18/30.  Bloodwork was ordered, he is scheduled for an MRI brain with NeuroQuant sequence. His family feels that memory changes started only a month ago. He became tearful stating that "I cannot remember things," making him frustrated. He states "some things I can remember, but others I do not." He would forget a conversation from 5 minutes prior. He used to remember announcer names well. He lives with his wife who has to double check behind him on his insulin, telling him what dosage to take. He writes down bills and his wife pays them. He got lost driving twice in the past month. He got lost driving to their granddaughter's graduation, he apparently got very upset/agitated. He does not remember this. He does recall getting lost driving to get gas. He states he "just about quit driving." Family now has a GPS tracker, his wife is always in the car with him now. He is independent with dressing and bathing.  His  daughter reports that he used to exercise more, then with the pandemic he got depressed. Family noticed that he did not want to be around his grandchildren anymore, getting easily agitated. Some people say something and it "sends him off to another pedestal." He can be so sweet sometimes then  curl up and not want to talk to anybody. He has always had issues with his siblings, "always been a part of him," this seems to be getting better. They do feel that since memory changes started, he is doing better with other people. Paxil was recently changed to Lexapro, which he has been taking for only 2 days. No paranoia or hallucinations. He was started on Donepezil 5mg  daily 3 weeks ago which he is overall tolerating. No family history of dementia. He does not drink alcohol. He denies any significant head injuries. He had a syncopal episode in 2018 where he woke up on the ground at work. I personally reviewed MRI brain with and without contrast done 11/2016 which did not show any acute changes. There was mild diffuse atrophy and mild to moderate chronic microvascular disease.   He used to have frequent headaches when he was working and they quieted down, but recently come back "like they used to be." There is no associated nausea/vomiting, photo/phonophobia. Family thinks it is more stress-related. He denies any dizziness, diplopia, dysarthria/dysphagia, bowel/bladder dysfunction, anosmia. He has chronic neck and back pain. He has some tingling and numbness in his hands and feet. He has occasional hand tremors. Sleep is pretty good. They deny any REM behavior disorder, but he has fallen out of bed while sleeping. This was initially happening every 2-3 weeks, but has quieted down, last occurred 3 weeks ago. Family reports he used to exercise more but stopped when the pandemic hit.  Laboratory Data: Last HbA1c 8.8   PAST MEDICAL HISTORY: Past Medical History:  Diagnosis Date  . Anemia   . Anxiety   . Arthritis   . Bradycardia   . CAD (coronary artery disease)    a. 02/2014 Neg Ex MV, EF 65%;  b. 06/2015 Abnl MV ([facility])-->Cath ([facility]): LM nl, LAD 30p/m, LCX 30p, OM2 50p, RCA 100p w/ L-->R collats, nl EF-->Med Rx; c. 10/2017 MV: No isch/scar; d. 11/2017 Cath: LM nl, LAD 45p/m, LCX 30ost, OM2 30,  RCA 100 CTO, L->R collats. PA 31/16/22, PCWP 11.  . CKD (chronic kidney disease)   . Depression   . Diabetes mellitus, type II, insulin dependent ([place])    With neuropathy  . Diverticulitis   . Essential hypertension   . Hyperlipidemia   . Neuropathy   . Obesity   . Osteoporosis   . Pure hypercholesterolemia   . Shortness of breath dyspnea   . Syncope and collapse    a. 02/2014 Carotid Dopplers: 1-39% bilateral ICA with normal Subclavian & vertebral arteries; b. 02/2014 2 D Echo: EF 50-55%, Gr 1 DD, Mild MR & LA dilation -- was bradycardic in 40s; c. 04/2014 Event monitor: RSR/SA/SB. Occas PVC's. No AFib.    MEDICATIONS: Current Outpatient Medications on File Prior to Visit  Medication Sig Dispense Refill  . acetaminophen (TYLENOL) 325 MG tablet Take 650 mg by mouth every 6 (six) hours as needed.    Marland Kitchen aspirin EC 81 MG tablet Take 81 mg by mouth daily.    . calcium carbonate (OS-CAL) 600 MG TABS tablet Take 600 mg by mouth daily with breakfast.    . cholecalciferol (VITAMIN D) 1000 UNITS tablet Take 2,000 Units  by mouth daily.     . ferrous sulfate 325 (65 FE) MG tablet Take 325 mg by mouth daily with breakfast.    . gabapentin (NEURONTIN) 300 MG capsule Take 300-600 mg by mouth See admin instructions. Take 1 capsule (300 mg) by mouth in the morning & 2 capsules (600 mg) by mouth every night.    . hydrochlorothiazide (MICROZIDE) 12.5 MG capsule Take 12.5 mg by mouth daily with breakfast.    . isosorbide mononitrate (IMDUR) 30 MG 24 hr tablet Take 1 tablet by mouth once daily 90 tablet 2  . LANTUS 100 UNIT/ML injection Inject 40 Units into the skin at bedtime. Take at bedtime    . losartan (COZAAR) 50 MG tablet Take 50 mg by mouth daily.     . metFORMIN (GLUCOPHAGE) 500 MG tablet Take 250 mg by mouth 2 (two) times daily with a meal.    . NOVOLOG 100 UNIT/ML injection Inject 20-38 Units into the skin 3 (three) times daily with meals. Sliding scale insulin base rates= 20 units in the morning,  26 units with lunch, 26 units with supper.    Marland Kitchen PARoxetine (PAXIL) 20 MG tablet Take 20 mg by mouth daily.    . simvastatin (ZOCOR) 20 MG tablet Take 20 mg by mouth daily.    . tamsulosin (FLOMAX) 0.4 MG CAPS capsule Take 0.4 mg by mouth daily. Takes 2 each morning.    . Vibegron (GEMTESA) 75 MG TABS Take 1 tablet by mouth daily. 30 tablet 11   No current facility-administered medications on file prior to visit.    ALLERGIES: Allergies  Allergen Reactions  . Amlodipine     Other reaction(s): Other (See Comments) Peripheral edema    FAMILY HISTORY: Family History  Problem Relation Age of Onset  . Heart attack Mother   . Breast cancer Mother   . Lung cancer Father     SOCIAL HISTORY: Social History   Socioeconomic History  . Marital status: Married    Spouse name: Not on file  . Number of children: Not on file  . Years of education: Not on file  . Highest education level: Not on file  Occupational History  . Not on file  Tobacco Use  . Smoking status: Former Smoker    Years: 1.00    Types: Pipe    Quit date: 1965    Years since quitting: 56.5  . Smokeless tobacco: Never Used  Vaping Use  . Vaping Use: Never used  Substance and Sexual Activity  . Alcohol use: No  . Drug use: No  . Sexual activity: Not on file  Other Topics Concern  . 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   Married - at least 1 daughter.   Never Smoked   Usually very active, but no exercise routine.   Social Determinants of Health   Financial Resource Strain:   . Difficulty of Paying Living Expenses:   Food Insecurity:   . Worried About Charity fundraiser in the Last Year:   . Arboriculturist in the Last Year:   Transportation Needs:   . Film/video editor (Medical):   Marland Kitchen Lack of Transportation (Non-Medical):   Physical Activity:   . Days of Exercise per Week:   . Minutes of Exercise per Session:   Stress:   . Feeling of Stress :    Social Connections:   . Frequency of Communication with Friends and  Family:   . Frequency of Social Gatherings with Friends and Family:   . Attends Religious Services:   . Active Member of Clubs or Organizations:   . Attends Archivist Meetings:   Marland Kitchen Marital Status:   Intimate Partner Violence:   . Fear of Current or Ex-Partner:   . Emotionally Abused:   Marland Kitchen Physically Abused:   . Sexually Abused:     PHYSICAL EXAM: Vitals:   08/01/19 1308  BP: (!) 126/55  Pulse: (!) 57  SpO2: 97%   General: No acute distress Head:  Normocephalic/atraumatic Skin/Extremities: No rash, no edema Neurological Exam: alert and oriented to person, place, and time. No aphasia or dysarthria. Fund of knowledge is appropriate.  Recent and remote memory are impaired.  Attention and concentration are reduced, he is perseverating about how belittled he felt with the Neuropschological testing earlier this month. Cranial nerves: Pupils equal, round. No facial asymmetry. Motor: moves all extremities symmetrically. Gait narrow-based and steady.   IMPRESSION: This is a 79 yo RH man with a history of hypertension, hyperlipidemia, diabetes, CAD, CKD, bradycardia, anxiety, depression, with worsening memory. MRI brain did not show any acute changes, there was diffuse volume loss, moderate chronic microvascular disease. Neuropsychological testing indicated Mild Cognitive Impairment, etiology unclear, possibly mixed AD and vascular, with overlaying depression and anxiety as contributing factors. We discussed results of testing, we discussed the diagnosis of MCI, continue Donepezil 5mg  daily. We discussed management of treatable causes of memory changes, including mood and labile glucose levels. They will discuss increasing Lexapro with PCP. Continue follow-up with Endocrinology. Family closely monitoring medications. We discussed driving and recommendation for driving evaluation. Start daily B12. We again discussed the  importance of control of vascular risk factors, physical exercise, and brain stimulation exercises for brain health. Repeat Neuropsychological testing will be done in July 2022 to assess trajectory. Follow-up in 6 months, they know to call for any changes.   Thank you for allowing me to participate in his care.  Please do not hesitate to call for any questions or concerns.  Ellouise Newer, M.D.   CC: Dr. Netty Starring

## 2019-08-01 NOTE — Patient Instructions (Addendum)
1. Continue Donepezil 5mg  daily  2. Discuss increasing Lexapro dose with your PCP  3. Start daily B12 575mcg supplement  4. Recommend driving evaluation: call [employer] in [place] or [employer] [phone]   5. Repeat Neurocognitive testing in July 2022  6. Follow-up in 6 months, call for any changes  FALL PRECAUTIONS: Be cautious when walking. Scan the area for obstacles that may increase the risk of trips and falls. When getting up in the mornings, sit up at the edge of the bed for a few minutes before getting out of bed. Consider elevating the bed at the head end to avoid drop of blood pressure when getting up. Walk always in a well-lit room (use night lights in the walls). Avoid area rugs or power cords from appliances in the middle of the walkways. Use a walker or a cane if necessary and consider physical therapy for balance exercise. Get your eyesight checked regularly.  FINANCIAL OVERSIGHT: Supervision, especially oversight when making financial decisions or transactions is also recommended.  HOME SAFETY: Consider the safety of the kitchen when operating appliances like stoves, microwave oven, and blender. Consider having supervision and share cooking responsibilities until no longer able to participate in those. Accidents with firearms and other hazards in the house should be identified and addressed as well.  DRIVING: Regarding driving, in patients with progressive memory problems, driving will be impaired. We advise to have someone else do the driving if trouble finding directions or if minor accidents are reported. Independent driving assessment is available to determine safety of driving.  ABILITY TO BE LEFT ALONE: If patient is unable to contact 911 operator, consider using LifeLine, or when the need is there, arrange for someone to stay with patients. Smoking is a fire hazard, consider supervision or cessation. Risk of wandering  should be assessed by caregiver and if detected at any point, supervision and safe proof recommendations should be instituted.  MEDICATION SUPERVISION: Inability to self-administer medication needs to be constantly addressed. Implement a mechanism to ensure safe administration of the medications.  RECOMMENDATIONS FOR ALL PATIENTS WITH MEMORY PROBLEMS: 1. Continue to exercise (Recommend 30 minutes of walking everyday, or 3 hours every week) 2. Increase social interactions - continue going to [place] and enjoy social gatherings with friends and family 3. Eat healthy, avoid fried foods and eat more fruits and vegetables 4. Maintain adequate blood pressure, blood sugar, and blood cholesterol level. Reducing the risk of stroke and cardiovascular disease also helps promoting better memory. 5. Avoid stressful situations. Live a simple life and avoid aggravations. Organize your time and prepare for the next day in anticipation. 6. Sleep well, avoid any interruptions of sleep and avoid any distractions in the bedroom that may interfere with adequate sleep quality 7. Avoid sugar, avoid sweets as there is a strong link between excessive sugar intake, diabetes, and cognitive impairment We discussed the Mediterranean diet, which has been shown to help patients reduce the risk of progressive memory disorders and reduces cardiovascular risk. This includes eating fish, eat fruits and green leafy vegetables, nuts like almonds and hazelnuts, walnuts, and also use olive oil. Avoid fast foods and fried foods as much as possible. Avoid sweets and sugar as sugar use has been linked to worsening of memory function.  There is always a concern of gradual progression of memory problems. If this is the case, then we may need to adjust level of care according to patient needs. Support, both to the patient and  caregiver, should then be put into place.

## 2019-08-04 ENCOUNTER — Telehealth: Payer: Self-pay

## 2019-08-04 NOTE — Telephone Encounter (Signed)
Incoming call from pt's wife requesting a more cost effective alternative to [place]. I have submited a prior auth to his insurance company however will likely be denied as pt has not tried and failed generic alternatives.   Alternatives listed on prior auth included oxybutynin, tolterodine, solifenacin, trospiuum, toviaz, and myrbetriq. Please advise.

## 2019-08-04 NOTE — Telephone Encounter (Signed)
He cant use those alternatives bc he has a lot of problems with constipation and those can make that much worse.  Lets hold off on changing anything until I see him in clinic next week and we can discuss more  Thanks [name], MD 08/04/2019

## 2019-08-05 NOTE — Telephone Encounter (Signed)
Called pt's wife informed her of the information below. Wife gave verbal understanding.  

## 2019-08-06 ENCOUNTER — Telehealth: Payer: Self-pay | Admitting: *Deleted

## 2019-08-06 ENCOUNTER — Telehealth: Payer: Self-pay

## 2019-08-06 NOTE — Telephone Encounter (Signed)
retuned call gave clinical information for PA on Gemtesa.

## 2019-08-06 NOTE — Telephone Encounter (Signed)
VM left from [facility] (did not a name) regarding a PA ref# [id number] call back # [phone]

## 2019-08-07 ENCOUNTER — Telehealth: Payer: Self-pay

## 2019-08-07 NOTE — Telephone Encounter (Signed)
Incoming denial of prior auth for Gemtesa 75mg . Will discuss alternatives at upcoming appointments.

## 2019-08-13 ENCOUNTER — Telehealth: Payer: Self-pay | Admitting: Family Medicine

## 2019-08-13 ENCOUNTER — Ambulatory Visit: Payer: PPO | Admitting: Urology

## 2019-08-13 NOTE — Telephone Encounter (Signed)
Patient notified and apt was changed to virtual

## 2019-08-13 NOTE — Telephone Encounter (Signed)
Patient called and states that the medication Logan Bores is going to cost to much and he wants to know if there is another RX he could try that may be more affordable?

## 2019-08-13 NOTE — Telephone Encounter (Signed)
.  left message to have patient return my call, verbal from Dr. Diamantina Providence that pt can do a virtual visit.

## 2019-08-14 DIAGNOSIS — F329 Major depressive disorder, single episode, unspecified: Secondary | ICD-10-CM | POA: Diagnosis not present

## 2019-08-14 DIAGNOSIS — F419 Anxiety disorder, unspecified: Secondary | ICD-10-CM | POA: Diagnosis not present

## 2019-08-14 DIAGNOSIS — R4189 Other symptoms and signs involving cognitive functions and awareness: Secondary | ICD-10-CM | POA: Diagnosis not present

## 2019-08-18 ENCOUNTER — Telehealth (INDEPENDENT_AMBULATORY_CARE_PROVIDER_SITE_OTHER): Payer: PPO | Admitting: Urology

## 2019-08-18 ENCOUNTER — Other Ambulatory Visit: Payer: Self-pay

## 2019-08-18 ENCOUNTER — Ambulatory Visit: Payer: PPO | Admitting: Urology

## 2019-08-18 DIAGNOSIS — N3281 Overactive bladder: Secondary | ICD-10-CM

## 2019-08-18 NOTE — Progress Notes (Signed)
Virtual Visit via Telephone Note  I connected with [name] on 08/18/19 at  3:45 PM EDT by telephone and verified that I am speaking with the correct person using two identifiers.   Patient location: Home Provider location: [facility]  I discussed the limitations, risks, security and privacy concerns of performing an evaluation and management service by telephone and the availability of in person appointments. We discussed the impact of the COVID-19 pandemic on the healthcare system, and the importance of social distancing and reducing patient and provider exposure. I also discussed with the patient that there may be a patient responsible charge related to this service. The patient expressed understanding and agreed to proceed.  Reason for visit: OAB symptoms  History of Present Illness: I had phone follow-up with Mr. Dollins today.  I originally saw him in June 2021 for abdominal pain with urinary urgency, frequency, and urge incontinence.  Urinalysis was benign and PVR was normal.  He was also having moderate to severe constipation at that time.  He ultimately underwent a CT scan that showed no abnormalities for his abdominal pain.  He has already been on Flomax and finasteride, and I gave him a month of Myrbetriq samples for his urinary symptoms.  He reports significant improvement on the Myrbetriq, but he has been out of the samples for over a week and never refilled the medication.  He denies any urinary complaints at this time and is doing well.   Follow Up: Follow-up in clinic in 3 to 4 months for PVR and symptom check, consider Vesicare at that time or if recurrence of symptoms as Myrbetriq not affordable for him   I discussed the assessment and treatment plan with the patient. The patient was provided an opportunity to ask questions and all were answered. The patient agreed with the plan and demonstrated an understanding of the instructions.   The patient was  advised to call back or seek an in-person evaluation if the symptoms worsen or if the condition fails to improve as anticipated.  I provided 13 minutes of non-face-to-face time during this encounter.   Billey Co, MD

## 2019-08-18 NOTE — Progress Notes (Signed)
This service is provided via telemedicine   No vital signs collected/recorded due to the encounter was a telemedicine visit.     Patient consents to a telephone visit:  yes    Names of all persons participating in the telemedicine service and their role in the encounter:  Karmah Potocki, CMA   

## 2019-08-19 ENCOUNTER — Telehealth: Payer: Self-pay | Admitting: Urology

## 2019-08-19 NOTE — Telephone Encounter (Signed)
-----   Message from Billey Co, MD sent at 08/18/2019  4:18 PM EDT ----- Regarding: follow up RTC 3-4 months for PVR/IPSS, symptom check, thanks  [name], MD 08/18/2019

## 2019-08-19 NOTE — Telephone Encounter (Signed)
App made and mailed °

## 2019-08-25 DIAGNOSIS — D044 Carcinoma in situ of skin of scalp and neck: Secondary | ICD-10-CM | POA: Diagnosis not present

## 2019-09-01 DIAGNOSIS — R1032 Left lower quadrant pain: Secondary | ICD-10-CM | POA: Diagnosis not present

## 2019-09-01 DIAGNOSIS — K5792 Diverticulitis of intestine, part unspecified, without perforation or abscess without bleeding: Secondary | ICD-10-CM | POA: Diagnosis not present

## 2019-09-01 DIAGNOSIS — E1140 Type 2 diabetes mellitus with diabetic neuropathy, unspecified: Secondary | ICD-10-CM | POA: Diagnosis not present

## 2019-09-01 DIAGNOSIS — R1031 Right lower quadrant pain: Secondary | ICD-10-CM | POA: Diagnosis not present

## 2019-09-01 DIAGNOSIS — G309 Alzheimer's disease, unspecified: Secondary | ICD-10-CM | POA: Diagnosis not present

## 2019-09-01 DIAGNOSIS — K429 Umbilical hernia without obstruction or gangrene: Secondary | ICD-10-CM | POA: Diagnosis not present

## 2019-09-01 DIAGNOSIS — F0150 Vascular dementia without behavioral disturbance: Secondary | ICD-10-CM | POA: Diagnosis not present

## 2019-09-01 DIAGNOSIS — K5900 Constipation, unspecified: Secondary | ICD-10-CM | POA: Diagnosis not present

## 2019-09-01 DIAGNOSIS — R001 Bradycardia, unspecified: Secondary | ICD-10-CM | POA: Diagnosis not present

## 2019-09-02 ENCOUNTER — Ambulatory Visit: Payer: PPO | Admitting: Neurology

## 2019-09-03 DIAGNOSIS — E1140 Type 2 diabetes mellitus with diabetic neuropathy, unspecified: Secondary | ICD-10-CM | POA: Diagnosis not present

## 2019-09-03 DIAGNOSIS — K5900 Constipation, unspecified: Secondary | ICD-10-CM | POA: Diagnosis not present

## 2019-09-03 DIAGNOSIS — R109 Unspecified abdominal pain: Secondary | ICD-10-CM | POA: Diagnosis not present

## 2019-09-03 DIAGNOSIS — K5792 Diverticulitis of intestine, part unspecified, without perforation or abscess without bleeding: Secondary | ICD-10-CM | POA: Diagnosis not present

## 2019-09-03 DIAGNOSIS — Z792 Long term (current) use of antibiotics: Secondary | ICD-10-CM | POA: Diagnosis not present

## 2019-09-08 DIAGNOSIS — C44519 Basal cell carcinoma of skin of other part of trunk: Secondary | ICD-10-CM | POA: Diagnosis not present

## 2019-09-08 DIAGNOSIS — D044 Carcinoma in situ of skin of scalp and neck: Secondary | ICD-10-CM | POA: Diagnosis not present

## 2019-09-10 ENCOUNTER — Ambulatory Visit: Payer: PPO | Admitting: Gastroenterology

## 2019-09-10 ENCOUNTER — Other Ambulatory Visit: Payer: Self-pay

## 2019-09-10 VITALS — BP 101/57 | HR 52 | Temp 98.3°F | Ht 67.0 in | Wt 202.0 lb

## 2019-09-10 DIAGNOSIS — K862 Cyst of pancreas: Secondary | ICD-10-CM | POA: Diagnosis not present

## 2019-09-10 DIAGNOSIS — K5792 Diverticulitis of intestine, part unspecified, without perforation or abscess without bleeding: Secondary | ICD-10-CM | POA: Diagnosis not present

## 2019-09-10 NOTE — Progress Notes (Signed)
Jonathon Bellows MD, MRCP(U.K) [address]  [place]  [place], [place] [address]  Main: [phone]  Fax: [phone]   Gastroenterology Consultation  Referring Provider:     [name], MD Primary Care Physician:  [name], MD Primary Gastroenterologist:  Dr. Jonathon Bellows  Reason for Consultation:     Transfer of care from [facility] clinic gastroenterology        HPI:   Ronald Wallace is a 79 y.o. y/o male referred for consultation & management  by Dr. [name], MD.    This is a patient who has been established previously at [facility] clinic and is here today to see me to transfer care to our practice.  He was initially seen in July 2020 and had a history of adenomatous polyps.  At that point of time the office note mentions that he has iron deficiency anemia.  Patient was commenced on iron tablets and only a colonoscopy was ordered unclear why an upper endoscopy was not ordered.  Colonoscopy was performed in November 2020 and showed diverticulosis of the sigmoid colon otherwise the colonoscopy was normal.  On 09/01/2019 the patient presented to the ER at [facility] for constipation and diffuse lower abdominal pain with chills of 2 days duration.  There was some abdominal tenderness CT scan showed evidence of diverticulitis without perforation commenced on ciprofloxacin and Flagyl and discharged.  Small fat-containing umbilical hernia. Scattered cystic lesion seen in the pancreatic body and tail of 0.8 cm no ductal dilation.  Since he commenced on the antibiotics he is feeling much better no abdominal pain and normal bowel movements have returned. Past Medical History:  Diagnosis Date  . Anemia   . Anxiety   . Arthritis   . Bradycardia   . CAD (coronary artery disease)    a. 02/2014 Neg Ex MV, EF 65%;  b. 06/2015 Abnl MV ([facility])-->Cath ([facility]): LM nl, LAD 30p/m, LCX 30p, OM2 50p, RCA 100p w/ L-->R collats, nl EF-->Med Rx; c. 10/2017 MV: No isch/scar; d.  11/2017 Cath: LM nl, LAD 45p/m, LCX 30ost, OM2 30, RCA 100 CTO, L->R collats. PA 31/16/22, PCWP 11.  . CKD (chronic kidney disease)   . Depression   . Diabetes mellitus, type II, insulin dependent ([place])    With neuropathy  . Diverticulitis   . Essential hypertension   . Hyperlipidemia   . Neuropathy   . Obesity   . Osteoporosis   . Pure hypercholesterolemia   . Shortness of breath dyspnea   . Syncope and collapse    a. 02/2014 Carotid Dopplers: 1-39% bilateral ICA with normal Subclavian & vertebral arteries; b. 02/2014 2 D Echo: EF 50-55%, Gr 1 DD, Mild MR & LA dilation -- was bradycardic in 40s; c. 04/2014 Event monitor: RSR/SA/SB. Occas PVC's. No AFib.    Past Surgical History:  Procedure Laterality Date  . CARDIAC CATHETERIZATION Left 07/07/2015   Procedure: Left Heart Cath and Coronary Angiography;  Surgeon: Isaias Cowman, MD;  Location: [facility];  Service: Cardiovascular;  Laterality: Left;  . CARPAL TUNNEL RELEASE    . CATARACT EXTRACTION Right   . CATARACT EXTRACTION W/PHACO Left 10/06/2015   Procedure: CATARACT EXTRACTION PHACO AND INTRAOCULAR LENS PLACEMENT (IOC);  Surgeon: Estill Cotta, MD;  Location: [facility] ORS;  Service: Ophthalmology;  Laterality: Left;  [place] 01:42 AP% 25.1 CDE 45.92 Fluid pack lot # [id number]  . COLONOSCOPY    . COLONOSCOPY WITH PROPOFOL N/A 12/04/2018   Procedure: COLONOSCOPY WITH PROPOFOL;  Surgeon: [place],  Benay Pike, MD;  Location: [facility] ENDOSCOPY;  Service: Gastroenterology;  Laterality: N/A;  . NM MYOVIEW LTD  03/13/2014   LOW RISK - positive EKG but negative imaging: 2 mm ST Depression in V4 & V5 but No scintigraphic evidence of Ischemia or Infarct o  . RIGHT/LEFT HEART CATH AND CORONARY ANGIOGRAPHY N/A 11/21/2017   Procedure: RIGHT/LEFT HEART CATH AND CORONARY ANGIOGRAPHY;  Surgeon: Minna Merritts, MD;  Location: [facility];  Service: Cardiovascular;  Laterality: N/A;  . TRANSTHORACIC ECHOCARDIOGRAM  03/12/2014   EF  50-55%, Gr 1 DD, no RWMA, Mlid MR & LA dil, Normla RV and bradycardic 40s -    Prior to Admission medications   Medication Sig Start Date End Date Taking? Authorizing Provider  acetaminophen (TYLENOL) 325 MG tablet Take 650 mg by mouth every 6 (six) hours as needed.    [provider]  aspirin EC 81 MG tablet Take 81 mg by mouth daily.    [provider]  calcium carbonate (OS-CAL) 600 MG TABS tablet Take 600 mg by mouth daily with breakfast.    [provider]  cholecalciferol (VITAMIN D) 1000 UNITS tablet Take 2,000 Units by mouth daily.     [provider]  donepezil (ARICEPT) 10 MG tablet Take 10 mg by mouth daily. 08/15/19   [provider]  escitalopram (LEXAPRO) 10 MG tablet Take 1 tablet by mouth daily. 07/23/19   [provider]  ferrous sulfate 325 (65 FE) MG tablet Take 325 mg by mouth daily with breakfast.    [provider]  gabapentin (NEURONTIN) 300 MG capsule Take 300-600 mg by mouth See admin instructions. Take 1 capsule (300 mg) by mouth in the morning & 2 capsules (600 mg) by mouth every night.    [provider]  hydrochlorothiazide (MICROZIDE) 12.5 MG capsule Take 12.5 mg by mouth daily with breakfast.    [provider]  isosorbide mononitrate (IMDUR) 30 MG 24 hr tablet Take 1 tablet by mouth once daily 05/07/19   Minna Merritts, MD  LEVEMIR FLEXTOUCH 100 UNIT/ML FlexPen Inject 40 Units into the skin at bedtime.  07/02/19   [provider]  losartan (COZAAR) 50 MG tablet Take 50 mg by mouth daily.  06/14/17   [provider]  metFORMIN (GLUCOPHAGE) 500 MG tablet Take 250 mg by mouth 2 (two) times daily with a meal.    [provider]  NOVOLOG 100 UNIT/ML injection Inject 20-38 Units into the skin 3 (three) times daily with meals. Sliding scale insulin base rates= 20 units in the morning, 26 units with lunch, 26 units with supper. 05/06/15   [provider]   simvastatin (ZOCOR) 20 MG tablet Take 20 mg by mouth daily.    [provider]  tamsulosin (FLOMAX) 0.4 MG CAPS capsule Take 0.4 mg by mouth daily. Takes 2 each morning.    [provider]  Vibegron (GEMTESA) 75 MG TABS Take 1 tablet by mouth daily. 07/31/19   Billey Co, MD    Family History  Problem Relation Age of Onset  . Heart attack Mother   . Breast cancer Mother   . Lung cancer Father      Social History   Tobacco Use  . Smoking status: Former Smoker    Years: 1.00    Types: Pipe    Quit date: 1965    Years since quitting: 56.6  . Smokeless tobacco: Never Used  Vaping Use  . Vaping Use: Never  used  Substance Use Topics  . Alcohol use: No  . Drug use: No    Allergies as of 09/10/2019 - Review Complete 08/18/2019  Allergen Reaction Noted  . Amlodipine  03/11/2014    Review of Systems:    All systems reviewed and negative except where noted in HPI.   Physical Exam:  There were no vitals taken for this visit. No LMP for male patient. Psych:  Alert and cooperative. Normal mood and affect. General:   Alert,  Well-developed, well-nourished, pleasant and cooperative in NAD Head:  Normocephalic and atraumatic. Eyes:  Sclera clear, no icterus.   Conjunctiva pink. Ears:  Normal auditory acuity. Lungs:  Respirations even and unlabored.  Clear throughout to auscultation.   No wheezes, crackles, or rhonchi. No acute distress. Heart:  Regular rate and rhythm; no murmurs, clicks, rubs, or gallops. Abdomen:  Normal bowel sounds.  No bruits.  Soft, non-tender and non-distended without masses, hepatosplenomegaly or hernias noted.  No guarding or rebound tenderness.    Neurologic:  Alert and oriented x3;  grossly normal neurologically. Psych:  Alert and cooperative. Normal mood and affect.  Imaging Studies: No results found.  Assessment and Plan:   ZACH TIETJE is a 79 y.o. y/o male has been referred for transfer of care from [facility]  gastroenterology.  Recent episode of sigmoid diverticulitis treated with antibiotics at [facility].  He has 5 more days left to complete his course of antibiotics.  Doing well.  Incidentally found pancreatic cyst during evaluation.   1.  Pancreatic cyst repeat CT scan in 2 years 2.  Acute diverticulitis complete course of antibiotics will not require a repeat colonoscopy in the as he has already had one within the past 1 year.   Follow up in as needed  Dr Jonathon Bellows MD,MRCP(U.K)

## 2019-09-10 NOTE — Patient Instructions (Signed)

## 2019-09-17 ENCOUNTER — Telehealth (INDEPENDENT_AMBULATORY_CARE_PROVIDER_SITE_OTHER): Payer: PPO | Admitting: Gastroenterology

## 2019-09-17 DIAGNOSIS — R109 Unspecified abdominal pain: Secondary | ICD-10-CM

## 2019-09-18 DIAGNOSIS — E1142 Type 2 diabetes mellitus with diabetic polyneuropathy: Secondary | ICD-10-CM | POA: Diagnosis not present

## 2019-09-18 DIAGNOSIS — E1159 Type 2 diabetes mellitus with other circulatory complications: Secondary | ICD-10-CM | POA: Diagnosis not present

## 2019-09-18 DIAGNOSIS — E1169 Type 2 diabetes mellitus with other specified complication: Secondary | ICD-10-CM | POA: Diagnosis not present

## 2019-09-18 NOTE — Progress Notes (Signed)
Ronald Wallace , MD [address]  [place]  [place], [place] [address]  Main: [phone]  Fax: [phone]   Primary Care Physician: [name], MD  Virtual Visit via Telephone Note  I connected with patient on 09/18/19 at  2:00 PM EDT by telephone and verified that I am speaking with the correct person using two identifiers.   I discussed the limitations, risks, security and privacy concerns of performing an evaluation and management service by telephone and the availability of in person appointments. I also discussed with the patient that there may be a patient responsible charge related to this service. The patient expressed understanding and agreed to proceed.  Location of Patient: Home Location of Provider: Home Persons involved: Patient and provider only   History of Present Illness:   HPI: Ronald Wallace is a 79 y.o. male   Abdominal pain   Summary of history : Initially referred and seen on 09/10/2019 to transfer care to our practice from [facility] clinic gastroenterology. He was initially seen in July 2020 and had a history of adenomatous polyps.  At that point of time the office note mentions that he has iron deficiency anemia.  Patient was commenced on iron tablets and only a colonoscopy was ordered unclear why an upper endoscopy was not ordered.  Colonoscopy was performed in November 2020 and showed diverticulosis of the sigmoid colon otherwise the colonoscopy was normal.  On 09/01/2019 the patient presented to the ER at [facility] for constipation and diffuse lower abdominal pain with chills of 2 days duration.  There was some abdominal tenderness CT scan showed evidence of diverticulitis without perforation commenced on ciprofloxacin and Flagyl and discharged.  Small fat-containing umbilical hernia. Scattered cystic lesion seen in the pancreatic body and tail of 0.8 cm no ductal dilation.  Since he commenced on the antibiotics he is feeling much better no  abdominal pain and normal bowel movements have returned.   Interval history   Since his last office visit about a week back he was doing fine but called me yesterday to inform me that the day prior he developed left lower quadrant cramping pain and had some diarrhea which lasted for under 24 hours and had resolved at the time that I spoke to him.  When I spoke to him yesterday he had no fever, no abdominal pain, no diarrhea but had some fatigue.  Denies any NSAID use.  No rectal bleeding.     Current Outpatient Medications  Medication Sig Dispense Refill  . acetaminophen (TYLENOL) 325 MG tablet Take 650 mg by mouth every 6 (six) hours as needed.    Marland Kitchen aspirin EC 81 MG tablet Take 81 mg by mouth daily.    . calcium carbonate (OS-CAL) 600 MG TABS tablet Take 600 mg by mouth daily with breakfast.    . cholecalciferol (VITAMIN D) 1000 UNITS tablet Take 2,000 Units by mouth daily.     Marland Kitchen donepezil (ARICEPT) 10 MG tablet Take 10 mg by mouth daily.    Marland Kitchen escitalopram (LEXAPRO) 10 MG tablet Take 1 tablet by mouth daily.    . ferrous sulfate 325 (65 FE) MG tablet Take 325 mg by mouth daily with breakfast.    . gabapentin (NEURONTIN) 300 MG capsule Take 300-600 mg by mouth See admin instructions. Take 1 capsule (300 mg) by mouth in the morning & 2 capsules (600 mg) by mouth every night.    . hydrochlorothiazide (MICROZIDE) 12.5 MG capsule Take 12.5 mg by mouth  daily with breakfast.    . isosorbide mononitrate (IMDUR) 30 MG 24 hr tablet Take 1 tablet by mouth once daily 90 tablet 2  . LEVEMIR FLEXTOUCH 100 UNIT/ML FlexPen Inject 40 Units into the skin at bedtime.     Marland Kitchen losartan (COZAAR) 50 MG tablet Take 50 mg by mouth daily.     . metFORMIN (GLUCOPHAGE) 500 MG tablet Take 250 mg by mouth 2 (two) times daily with a meal.    . NOVOLOG 100 UNIT/ML injection Inject 20-38 Units into the skin 3 (three) times daily with meals. Sliding scale insulin base rates= 20 units in the morning, 26 units with lunch, 26  units with supper.    . simvastatin (ZOCOR) 20 MG tablet Take 20 mg by mouth daily.    . tamsulosin (FLOMAX) 0.4 MG CAPS capsule Take 0.4 mg by mouth daily. Takes 2 each morning.    . Vibegron (GEMTESA) 75 MG TABS Take 1 tablet by mouth daily. 30 tablet 11   No current facility-administered medications for this visit.    Allergies as of 09/17/2019 - Review Complete 09/10/2019  Allergen Reaction Noted  . Amlodipine  03/11/2014    Review of Systems:    All systems reviewed and negative except where noted in HPI.   Observations/Objective:  Labs: CMP     Component Value Date/Time   NA 140 06/18/2019 1154   NA 139 02/15/2014 1328   K 5.0 06/18/2019 1154   K 4.9 02/15/2014 1328   CL 107 06/18/2019 1154   CL 105 02/15/2014 1328   CO2 31 06/18/2019 1154   CO2 29 02/15/2014 1328   GLUCOSE 84 06/18/2019 1154   GLUCOSE 90 02/15/2014 1328   BUN 30 (H) 06/18/2019 1154   BUN 22 (H) 02/15/2014 1328   CREATININE 1.49 06/18/2019 1154   CREATININE 1.36 (H) 02/15/2014 1328   CALCIUM 9.9 06/18/2019 1154   CALCIUM 10.4 (H) 02/15/2014 1328   PROT 6.7 06/18/2019 1154   PROT 7.9 02/15/2014 1328   ALBUMIN 4.0 06/18/2019 1154   ALBUMIN 3.8 02/15/2014 1328   AST 16 06/18/2019 1154   AST 23 02/15/2014 1328   ALT 17 06/18/2019 1154   ALT 19 02/15/2014 1328   ALKPHOS 53 06/18/2019 1154   ALKPHOS 67 02/15/2014 1328   BILITOT 0.4 06/18/2019 1154   BILITOT 0.6 02/15/2014 1328   GFRNONAA 48 (L) 11/20/2017 0859   GFRNONAA 55 (L) 02/15/2014 1328   GFRAA 55 (L) 11/20/2017 0859   GFRAA >60 02/15/2014 1328   Lab Results  Component Value Date   WBC 7.4 06/18/2019   HGB 10.9 (L) 06/18/2019   HCT 32.0 (L) 06/18/2019   MCV 89.5 06/18/2019   PLT 151.0 06/18/2019    Imaging Studies: No results found.  Assessment and Plan:   Ronald Wallace is a 79 y.o. y/o male was recently treated for sigmoid diverticulitis with antibiotics at [facility].  He completed his antibiotics about 2 to 3 days back.  He  called our office yesterday with 1 day onset of left lower quadrant pain and nonbloody diarrhea.  That has resolved spontaneously on its own by the time I called him up last afternoon.  Based on his history does not appear that he has had an episode of diverticulitis since it was less than 24 hours duration.  It is possible some of the diarrhea may be related to the antibiotics he was using associated with cramping.I called again on 09/18/2019 and he said he was completely asymptomatic.  I discussed the assessment and treatment plan with the patient. The patient was provided an opportunity to ask questions and all were answered. The patient agreed with the plan and demonstrated an understanding of the instructions.   The patient was advised to call back or seek an in-person evaluation if the symptoms worsen or if the condition fails to improve as anticipated.  I provided 15 minutes of non-face-to-face time during this encounter.  Dr Ronald Bellows MD,MRCP [facility]) Gastroenterology/Hepatology Pager: [phone]   Speech recognition software was used to dictate this note.

## 2019-09-24 ENCOUNTER — Telehealth: Payer: Self-pay

## 2019-09-24 DIAGNOSIS — E1159 Type 2 diabetes mellitus with other circulatory complications: Secondary | ICD-10-CM | POA: Diagnosis not present

## 2019-09-24 DIAGNOSIS — R197 Diarrhea, unspecified: Secondary | ICD-10-CM

## 2019-09-24 DIAGNOSIS — I152 Hypertension secondary to endocrine disorders: Secondary | ICD-10-CM | POA: Diagnosis not present

## 2019-09-24 DIAGNOSIS — E1142 Type 2 diabetes mellitus with diabetic polyneuropathy: Secondary | ICD-10-CM | POA: Diagnosis not present

## 2019-09-24 DIAGNOSIS — E785 Hyperlipidemia, unspecified: Secondary | ICD-10-CM | POA: Diagnosis not present

## 2019-09-24 DIAGNOSIS — Z794 Long term (current) use of insulin: Secondary | ICD-10-CM | POA: Diagnosis not present

## 2019-09-24 DIAGNOSIS — E1169 Type 2 diabetes mellitus with other specified complication: Secondary | ICD-10-CM | POA: Diagnosis not present

## 2019-09-24 NOTE — Telephone Encounter (Signed)
Pt daughter called to request Dr. Georgeann Oppenheim advice. Pt has began experiencing diarrhea again the past 2-3 days and pt also has weakness and fatigue.

## 2019-09-25 NOTE — Telephone Encounter (Signed)
Check stool for C diff and GI PCR- check if has fevers or abdominal pain

## 2019-09-25 NOTE — Telephone Encounter (Signed)
Pt notified of Dr. Georgeann Oppenheim suggestion. Pt agrees to stool test. Pt has no fever or abdominal pain.

## 2019-10-01 DIAGNOSIS — R197 Diarrhea, unspecified: Secondary | ICD-10-CM | POA: Diagnosis not present

## 2019-10-05 LAB — GI PROFILE, STOOL, PCR

## 2019-10-05 LAB — C DIFFICILE, CYTOTOXIN B

## 2019-10-05 LAB — C DIFFICILE TOXINS A+B W/RFLX: C difficile Toxins A+B, EIA: NEGATIVE

## 2019-10-06 ENCOUNTER — Encounter: Payer: Self-pay | Admitting: Gastroenterology

## 2019-10-18 DIAGNOSIS — E1169 Type 2 diabetes mellitus with other specified complication: Secondary | ICD-10-CM | POA: Diagnosis not present

## 2019-10-18 DIAGNOSIS — E1159 Type 2 diabetes mellitus with other circulatory complications: Secondary | ICD-10-CM | POA: Diagnosis not present

## 2019-10-18 DIAGNOSIS — E1142 Type 2 diabetes mellitus with diabetic polyneuropathy: Secondary | ICD-10-CM | POA: Diagnosis not present

## 2019-10-29 DIAGNOSIS — E1159 Type 2 diabetes mellitus with other circulatory complications: Secondary | ICD-10-CM | POA: Diagnosis not present

## 2019-10-29 DIAGNOSIS — I152 Hypertension secondary to endocrine disorders: Secondary | ICD-10-CM | POA: Diagnosis not present

## 2019-10-29 DIAGNOSIS — Z794 Long term (current) use of insulin: Secondary | ICD-10-CM | POA: Diagnosis not present

## 2019-10-29 DIAGNOSIS — E785 Hyperlipidemia, unspecified: Secondary | ICD-10-CM | POA: Diagnosis not present

## 2019-10-29 DIAGNOSIS — E1169 Type 2 diabetes mellitus with other specified complication: Secondary | ICD-10-CM | POA: Diagnosis not present

## 2019-10-29 DIAGNOSIS — E1142 Type 2 diabetes mellitus with diabetic polyneuropathy: Secondary | ICD-10-CM | POA: Diagnosis not present

## 2019-11-11 DIAGNOSIS — E119 Type 2 diabetes mellitus without complications: Secondary | ICD-10-CM | POA: Diagnosis not present

## 2019-11-18 DIAGNOSIS — E1142 Type 2 diabetes mellitus with diabetic polyneuropathy: Secondary | ICD-10-CM | POA: Diagnosis not present

## 2019-11-18 DIAGNOSIS — E1169 Type 2 diabetes mellitus with other specified complication: Secondary | ICD-10-CM | POA: Diagnosis not present

## 2019-11-18 DIAGNOSIS — E1159 Type 2 diabetes mellitus with other circulatory complications: Secondary | ICD-10-CM | POA: Diagnosis not present

## 2019-11-19 ENCOUNTER — Other Ambulatory Visit: Payer: Self-pay

## 2019-11-19 ENCOUNTER — Encounter: Payer: Self-pay | Admitting: Emergency Medicine

## 2019-11-19 ENCOUNTER — Ambulatory Visit
Admission: EM | Admit: 2019-11-19 | Discharge: 2019-11-19 | Disposition: A | Discharge status: Home / Self Care | Payer: PPO | Attending: Family Medicine | Admitting: Family Medicine

## 2019-11-19 DIAGNOSIS — Z23 Encounter for immunization: Secondary | ICD-10-CM | POA: Diagnosis not present

## 2019-11-19 DIAGNOSIS — S0101XA Laceration without foreign body of scalp, initial encounter: Secondary | ICD-10-CM

## 2019-11-19 MED ORDER — TETANUS-DIPHTHERIA TOXOIDS TD 5-2 LFU IM INJ
0.5000 mL | INJECTION | Freq: Once | INTRAMUSCULAR | Status: AC
  Administered 2019-11-19: 0.5 mL via INTRAMUSCULAR

## 2019-11-19 NOTE — Discharge Instructions (Signed)
Sutures out in 7 days.  Keep clean.  Take care  Dr. Lacinda Axon

## 2019-11-19 NOTE — ED Provider Notes (Signed)
[facility]    CSN: [id number] Arrival date & time: 11/19/19  1557      History   Chief Complaint Chief Complaint  Patient presents with  . Laceration    scalp   HPI  79 year old male presents with a scalp laceration.  Hit the top of his head on a metal carport earlier today.  He did not fall.  He has a linear laceration at the crown of his scalp.  He has no other injuries.  No loss of conscious.  He is in need of tetanus today as his last tetanus was in 2010.  Bleeding well controlled.  Denies pain at this time.  No other associated symptoms.    Past Medical History:  Diagnosis Date  . Anemia   . Anxiety   . Arthritis   . Bradycardia   . CAD (coronary artery disease)    a. 02/2014 Neg Ex MV, EF 65%;  b. 06/2015 Abnl MV ([facility])-->Cath ([facility]): LM nl, LAD 30p/m, LCX 30p, OM2 50p, RCA 100p w/ L-->R collats, nl EF-->Med Rx; c. 10/2017 MV: No isch/scar; d. 11/2017 Cath: LM nl, LAD 45p/m, LCX 30ost, OM2 30, RCA 100 CTO, L->R collats. PA 31/16/22, PCWP 11.  . CKD (chronic kidney disease)   . Depression   . Diabetes mellitus, type II, insulin dependent ([place])    With neuropathy  . Diverticulitis   . Essential hypertension   . Hyperlipidemia   . Neuropathy   . Obesity   . Osteoporosis   . Pure hypercholesterolemia   . Shortness of breath dyspnea   . Syncope and collapse    a. 02/2014 Carotid Dopplers: 1-39% bilateral ICA with normal Subclavian & vertebral arteries; b. 02/2014 2 D Echo: EF 50-55%, Gr 1 DD, Mild MR & LA dilation -- was bradycardic in 40s; c. 04/2014 Event monitor: RSR/SA/SB. Occas PVC's. No AFib.    Patient Active Problem List   Diagnosis Date Noted  . Coronary artery disease involving native coronary artery of native heart without angina pectoris 11/19/2017  . Chronic renal insufficiency 09/27/2015  . CAD (coronary artery disease)   . Decreased exercise tolerance 03/12/2014  . Bruit of left carotid artery 03/12/2014  . DOE (dyspnea on exertion)  03/12/2014  . Episode of syncope 03/12/2014  . Bradycardia with 41 - 50 beats per minute 03/12/2014  . Essential hypertension   . Hyperlipidemia   . Diabetes mellitus, type II, insulin dependent ([place])     Past Surgical History:  Procedure Laterality Date  . CARDIAC CATHETERIZATION Left 07/07/2015   Procedure: Left Heart Cath and Coronary Angiography;  Surgeon: Isaias Cowman, MD;  Location: [facility];  Service: Cardiovascular;  Laterality: Left;  . CARPAL TUNNEL RELEASE    . CATARACT EXTRACTION Right   . CATARACT EXTRACTION W/PHACO Left 10/06/2015   Procedure: CATARACT EXTRACTION PHACO AND INTRAOCULAR LENS PLACEMENT (IOC);  Surgeon: Estill Cotta, MD;  Location: [facility];  Service: Ophthalmology;  Laterality: Left;  [place] 01:42 AP% 25.1 CDE 45.92 Fluid pack lot # [id number]  . COLONOSCOPY    . COLONOSCOPY WITH PROPOFOL N/A 12/04/2018   Procedure: COLONOSCOPY WITH PROPOFOL;  Surgeon: Toledo, Benay Pike, MD;  Location: [facility];  Service: Gastroenterology;  Laterality: N/A;  . NM MYOVIEW LTD  03/13/2014   LOW RISK - positive EKG but negative imaging: 2 mm ST Depression in V4 & V5 but No scintigraphic evidence of Ischemia or Infarct o  . RIGHT/LEFT HEART CATH AND CORONARY ANGIOGRAPHY N/A 11/21/2017  Procedure: RIGHT/LEFT HEART CATH AND CORONARY ANGIOGRAPHY;  Surgeon: Minna Merritts, MD;  Location: [facility];  Service: Cardiovascular;  Laterality: N/A;  . TRANSTHORACIC ECHOCARDIOGRAM  03/12/2014   EF 50-55%, Gr 1 DD, no RWMA, Mlid MR & LA dil, Normla RV and bradycardic 40s -       Home Medications    Prior to Admission medications   Medication Sig Start Date End Date Taking? Authorizing Provider  acetaminophen (TYLENOL) 325 MG tablet Take 650 mg by mouth every 6 (six) hours as needed.   Yes [provider]  aspirin EC 81 MG tablet Take 81 mg by mouth daily.   Yes [provider]  calcium carbonate (OS-CAL) 600 MG TABS tablet Take 600  mg by mouth daily with breakfast.   Yes [provider]  cholecalciferol (VITAMIN D) 1000 UNITS tablet Take 2,000 Units by mouth daily.    Yes [provider]  donepezil (ARICEPT) 10 MG tablet Take 10 mg by mouth daily. 08/15/19  Yes [provider]  escitalopram (LEXAPRO) 10 MG tablet Take 1 tablet by mouth daily. 07/23/19  Yes [provider]  ferrous sulfate 325 (65 FE) MG tablet Take 325 mg by mouth daily with breakfast.   Yes [provider]  gabapentin (NEURONTIN) 300 MG capsule Take 300-600 mg by mouth See admin instructions. Take 1 capsule (300 mg) by mouth in the morning & 2 capsules (600 mg) by mouth every night.   Yes [provider]  hydrochlorothiazide (MICROZIDE) 12.5 MG capsule Take 12.5 mg by mouth daily with breakfast.   Yes [provider]  isosorbide mononitrate (IMDUR) 30 MG 24 hr tablet Take 1 tablet by mouth once daily 05/07/19  Yes [name], Kathlene November, MD  LEVEMIR FLEXTOUCH 100 UNIT/ML FlexPen Inject 40 Units into the skin at bedtime.  07/02/19  Yes [provider]  losartan (COZAAR) 50 MG tablet Take 50 mg by mouth daily.  06/14/17  Yes [provider]  metFORMIN (GLUCOPHAGE) 500 MG tablet Take 250 mg by mouth 2 (two) times daily with a meal.   Yes [provider]  NOVOLOG 100 UNIT/ML injection Inject 20-38 Units into the skin 3 (three) times daily with meals. Sliding scale insulin base rates= 20 units in the morning, 26 units with lunch, 26 units with supper. 05/06/15  Yes [provider]  simvastatin (ZOCOR) 20 MG tablet Take 20 mg by mouth daily.   Yes [provider]  tamsulosin (FLOMAX) 0.4 MG CAPS capsule Take 0.4 mg by mouth daily. Takes 2 each morning.   Yes [provider]  Vibegron (GEMTESA) 75 MG TABS Take 1 tablet by mouth daily. 07/31/19  Yes Billey Co, MD    Family History Family History  Problem Relation Age of Onset  . Heart attack Mother   .  Breast cancer Mother   . Lung cancer Father     Social History Social History   Tobacco Use  . Smoking status: Former Smoker    Years: 1.00    Types: Pipe    Quit date: 1965    Years since quitting: 56.8  . Smokeless tobacco: Never Used  Vaping Use  . Vaping Use: Never used  Substance Use Topics  . Alcohol use: No  . Drug use: No     Allergies   Amlodipine   Review of Systems Review of Systems  Skin: Positive for wound.   Physical Exam Triage Vital Signs ED Triage Vitals  Enc  Vitals Group     BP 11/19/19 1613 (!) 144/58     Pulse Rate 11/19/19 1613 (!) 51     Resp 11/19/19 1613 18     Temp 11/19/19 1613 98.1 F (36.7 C)     Temp Source 11/19/19 1613 Oral     SpO2 11/19/19 1613 100 %     Weight 11/19/19 1613 200 lb (90.7 kg)     Height 11/19/19 1613 5' 8.5" (1.74 m)     Head Circumference --      Peak Flow --      Pain Score 11/19/19 1612 0     Pain Loc --      Pain Edu? --      Excl. in [place]? --    Updated Vital Signs BP (!) 144/58 (BP Location: Left Arm)   Pulse (!) 51   Temp 98.1 F (36.7 C) (Oral)   Resp 18   Ht 5' 8.5" (1.74 m)   Wt 90.7 kg   SpO2 100%   BMI 29.97 kg/m   Visual Acuity Right Eye Distance:   Left Eye Distance:   Bilateral Distance:    Right Eye Near:   Left Eye Near:    Bilateral Near:     Physical Exam Constitutional:      General: He is not in acute distress.    Appearance: Normal appearance. He is not ill-appearing.  HENT:     Head:      Comments: 3.5 cm linear laceration at the labeled location. Eyes:     General:        Right eye: No discharge.        Left eye: No discharge.     Conjunctiva/sclera: Conjunctivae normal.  Cardiovascular:     Rate and Rhythm: Regular rhythm. Bradycardia present.  Pulmonary:     Effort: Pulmonary effort is normal.     Breath sounds: Normal breath sounds. No wheezing or rales.  Neurological:     Mental Status: He is alert.  Psychiatric:        Mood and Affect: Mood normal.         Behavior: Behavior normal.    UC Treatments / Results  Labs (all labs ordered are listed, but only abnormal results are displayed) Labs Reviewed - No data to display  EKG   Radiology No results found.  Procedures Laceration Repair  Date/Time: 11/19/2019 5:04 PM Performed by: Coral Spikes, DO Authorized by: Coral Spikes, DO   Consent:    Consent obtained:  Verbal   Consent given by:  Patient Anesthesia (see MAR for exact dosages):    Anesthesia method:  Local infiltration   Local anesthetic:  Lidocaine 1% WITH epi Laceration details:    Location:  Scalp   Scalp location:  Crown   Length (cm):  3.5 Repair type:    Repair type:  Simple Pre-procedure details:    Preparation:  Patient was prepped and draped in usual sterile fashion Exploration:    Hemostasis achieved with:  Direct pressure   Contaminated: no   Treatment:    Area cleansed with:  Betadine   Amount of cleaning:  Standard Skin repair:    Repair method:  Sutures   Suture size:  5-0   Suture material:  Prolene   Suture technique:  Simple interrupted   Number of sutures:  4 Approximation:    Approximation:  Close Post-procedure details:    Dressing:  Antibiotic ointment   Patient tolerance of procedure:  Tolerated well, no immediate complications   (including critical care time)  Medications Ordered in UC Medications  tetanus & diphtheria toxoids (adult) (TENIVAC) injection 0.5 mL (0.5 mLs Intramuscular Given 11/19/19 1625)    Initial Impression / Assessment and Plan / UC Course  I have reviewed the triage vital signs and the nursing notes.  Pertinent labs & imaging results that were available during my care of the patient were reviewed by me and considered in my medical decision making (see chart for details).    79 year old male presents with a laceration.  Repaired as above.  Sutures out in 7 days.  Tetanus given today.  Supportive care.  Final Clinical Impressions(s) / UC Diagnoses    Final diagnoses:  Laceration of scalp, initial encounter     Discharge Instructions     Sutures out in 7 days.  Keep clean.  Take care  Dr. Lacinda Axon    ED Prescriptions    None     PDMP not reviewed this encounter.   Coral Spikes, [place] 11/19/19 1705

## 2019-11-19 NOTE — ED Triage Notes (Signed)
Patient in today after bumping his head on an aluminum car port earlier today. Patient's last Tetanus shot was 2010.

## 2019-11-20 ENCOUNTER — Ambulatory Visit: Payer: PPO | Admitting: Urology

## 2019-11-28 ENCOUNTER — Encounter: Payer: Self-pay | Admitting: Emergency Medicine

## 2019-11-28 ENCOUNTER — Ambulatory Visit
Admission: EM | Admit: 2019-11-28 | Discharge: 2019-11-28 | Disposition: A | Discharge status: Home / Self Care | Payer: PPO | Attending: Emergency Medicine | Admitting: Emergency Medicine

## 2019-11-28 ENCOUNTER — Other Ambulatory Visit: Payer: Self-pay

## 2019-11-28 DIAGNOSIS — Z4802 Encounter for removal of sutures: Secondary | ICD-10-CM | POA: Diagnosis not present

## 2019-11-28 DIAGNOSIS — L089 Local infection of the skin and subcutaneous tissue, unspecified: Secondary | ICD-10-CM

## 2019-11-28 DIAGNOSIS — T148XXA Other injury of unspecified body region, initial encounter: Secondary | ICD-10-CM

## 2019-11-28 MED ORDER — CEPHALEXIN 500 MG PO CAPS
500.0000 mg | ORAL_CAPSULE | Freq: Four times a day (QID) | ORAL | 0 refills | Status: AC
Start: 2019-11-28 — End: 2019-12-05

## 2019-11-28 NOTE — Discharge Instructions (Signed)
Complete course of antibiotics, I am concerned about infection to your wound.  Return in another 3-4 days for removal of the last suture.  Return if any worsening of pain, redness or drainage from the wound or otherwise worsening.

## 2019-11-28 NOTE — ED Provider Notes (Signed)
[facility]    CSN: [id number] Arrival date & time: 11/28/19  1558      History   Chief Complaint Chief Complaint  Patient presents with  . Suture / Staple Removal    HPI Ronald Wallace is a 79 y.o. male.   Ronald Wallace presents for suture removal. Was seen and treated for a laceration to the scalp on 11/3 with 4 sutures placed. Was directed to return in 7 days for removal. No increased pain or drainage. Nursing requests evaluation as it is noted to be red with some swelling. No fevers.     ROS per HPI, negative if not otherwise mentioned.      Past Medical History:  Diagnosis Date  . Anemia   . Anxiety   . Arthritis   . Bradycardia   . CAD (coronary artery disease)    a. 02/2014 Neg Ex MV, EF 65%;  b. 06/2015 Abnl MV ([facility])-->Cath ([facility]): LM nl, LAD 30p/m, LCX 30p, OM2 50p, RCA 100p w/ L-->R collats, nl EF-->Med Rx; c. 10/2017 MV: No isch/scar; d. 11/2017 Cath: LM nl, LAD 45p/m, LCX 30ost, OM2 30, RCA 100 CTO, L->R collats. PA 31/16/22, PCWP 11.  . CKD (chronic kidney disease)   . Depression   . Diabetes mellitus, type II, insulin dependent ([place])    With neuropathy  . Diverticulitis   . Essential hypertension   . Hyperlipidemia   . Neuropathy   . Obesity   . Osteoporosis   . Pure hypercholesterolemia   . Shortness of breath dyspnea   . Syncope and collapse    a. 02/2014 Carotid Dopplers: 1-39% bilateral ICA with normal Subclavian & vertebral arteries; b. 02/2014 2 D Echo: EF 50-55%, Gr 1 DD, Mild MR & LA dilation -- was bradycardic in 40s; c. 04/2014 Event monitor: RSR/SA/SB. Occas PVC's. No AFib.    Patient Active Problem List   Diagnosis Date Noted  . Coronary artery disease involving native coronary artery of native heart without angina pectoris 11/19/2017  . Chronic renal insufficiency 09/27/2015  . CAD (coronary artery disease)   . Decreased exercise tolerance 03/12/2014  . Bruit of left carotid artery 03/12/2014  . DOE (dyspnea on  exertion) 03/12/2014  . Episode of syncope 03/12/2014  . Bradycardia with 41 - 50 beats per minute 03/12/2014  . Essential hypertension   . Hyperlipidemia   . Diabetes mellitus, type II, insulin dependent ([place])     Past Surgical History:  Procedure Laterality Date  . CARDIAC CATHETERIZATION Left 07/07/2015   Procedure: Left Heart Cath and Coronary Angiography;  Surgeon: Isaias Cowman, MD;  Location: [facility];  Service: Cardiovascular;  Laterality: Left;  . CARPAL TUNNEL RELEASE    . CATARACT EXTRACTION Right   . CATARACT EXTRACTION W/PHACO Left 10/06/2015   Procedure: CATARACT EXTRACTION PHACO AND INTRAOCULAR LENS PLACEMENT (IOC);  Surgeon: Estill Cotta, MD;  Location: [facility];  Service: Ophthalmology;  Laterality: Left;  [place] 01:42 AP% 25.1 CDE 45.92 Fluid pack lot # [id number]  . COLONOSCOPY    . COLONOSCOPY WITH PROPOFOL N/A 12/04/2018   Procedure: COLONOSCOPY WITH PROPOFOL;  Surgeon: Toledo, Benay Pike, MD;  Location: [facility];  Service: Gastroenterology;  Laterality: N/A;  . NM MYOVIEW LTD  03/13/2014   LOW RISK - positive EKG but negative imaging: 2 mm ST Depression in V4 & V5 but No scintigraphic evidence of Ischemia or Infarct o  . RIGHT/LEFT HEART CATH AND CORONARY ANGIOGRAPHY N/A 11/21/2017   Procedure: RIGHT/LEFT HEART  CATH AND CORONARY ANGIOGRAPHY;  Surgeon: Minna Merritts, MD;  Location: [facility];  Service: Cardiovascular;  Laterality: N/A;  . TRANSTHORACIC ECHOCARDIOGRAM  03/12/2014   EF 50-55%, Gr 1 DD, no RWMA, Mlid MR & LA dil, Normla RV and bradycardic 40s -       Home Medications    Prior to Admission medications   Medication Sig Start Date End Date Taking? Authorizing Provider  acetaminophen (TYLENOL) 325 MG tablet Take 650 mg by mouth every 6 (six) hours as needed.    [provider]  aspirin EC 81 MG tablet Take 81 mg by mouth daily.    [provider]  calcium carbonate (OS-CAL) 600 MG TABS tablet Take  600 mg by mouth daily with breakfast.    [provider]  cephALEXin (KEFLEX) 500 MG capsule Take 1 capsule (500 mg total) by mouth 4 (four) times daily for 7 days. 11/28/19 12/05/19  Zigmund Gottron, NP  cholecalciferol (VITAMIN D) 1000 UNITS tablet Take 2,000 Units by mouth daily.     [provider]  donepezil (ARICEPT) 10 MG tablet Take 10 mg by mouth daily. 08/15/19   [provider]  escitalopram (LEXAPRO) 10 MG tablet Take 1 tablet by mouth daily. 07/23/19   [provider]  ferrous sulfate 325 (65 FE) MG tablet Take 325 mg by mouth daily with breakfast.    [provider]  gabapentin (NEURONTIN) 300 MG capsule Take 300-600 mg by mouth See admin instructions. Take 1 capsule (300 mg) by mouth in the morning & 2 capsules (600 mg) by mouth every night.    [provider]  hydrochlorothiazide (MICROZIDE) 12.5 MG capsule Take 12.5 mg by mouth daily with breakfast.    [provider]  isosorbide mononitrate (IMDUR) 30 MG 24 hr tablet Take 1 tablet by mouth once daily 05/07/19   Minna Merritts, MD  LEVEMIR FLEXTOUCH 100 UNIT/ML FlexPen Inject 40 Units into the skin at bedtime.  07/02/19   [provider]  losartan (COZAAR) 50 MG tablet Take 50 mg by mouth daily.  06/14/17   [provider]  metFORMIN (GLUCOPHAGE) 500 MG tablet Take 250 mg by mouth 2 (two) times daily with a meal.    [provider]  NOVOLOG 100 UNIT/ML injection Inject 20-38 Units into the skin 3 (three) times daily with meals. Sliding scale insulin base rates= 20 units in the morning, 26 units with lunch, 26 units with supper. 05/06/15   [provider]  simvastatin (ZOCOR) 20 MG tablet Take 20 mg by mouth daily.    [provider]  tamsulosin (FLOMAX) 0.4 MG CAPS capsule Take 0.4 mg by mouth daily. Takes 2 each morning.    [provider]  Vibegron (GEMTESA) 75 MG TABS Take 1 tablet by mouth daily. 07/31/19   Billey Co, MD    Family History Family History  Problem Relation Age of Onset  . Heart attack Mother   . Breast cancer Mother   . Lung cancer Father     Social History Social History   Tobacco Use  . Smoking status: Former Smoker    Years: 1.00    Types: Pipe    Quit date: 1965    Years since quitting: 56.9  . Smokeless tobacco: Never Used  Vaping Use  . Vaping Use: Never used  Substance Use Topics  . Alcohol use: No  . Drug use: No     Allergies   Amlodipine  Review of Systems Review of Systems   Physical Exam Triage Vital Signs ED Triage Vitals  Enc Vitals Group     BP 11/28/19 1611 (!) 122/54     Pulse Rate 11/28/19 1611 (!) 57     Resp 11/28/19 1611 16     Temp 11/28/19 1611 98.4 F (36.9 C)     Temp Source 11/28/19 1611 Oral     SpO2 11/28/19 1611 97 %     Weight 11/28/19 1609 199 lb 15.3 oz (90.7 kg)     Height --      Head Circumference --      Peak Flow --      Pain Score 11/28/19 1609 0     Pain Loc --      Pain Edu? --      Excl. in [place]? --    No data found.  Updated Vital Signs BP (!) 122/54 (BP Location: Right Arm)   Pulse (!) 57   Temp 98.4 F (36.9 C) (Oral)   Resp 16   Wt 199 lb 15.3 oz (90.7 kg)   SpO2 97%   BMI 29.96 kg/m   Visual Acuity Right Eye Distance:   Left Eye Distance:   Bilateral Distance:    Right Eye Near:   Left Eye Near:    Bilateral Near:     Physical Exam Constitutional:      Appearance: He is well-developed.  HENT:     Head:      Comments: Laceration to scalp which remains well approximated but with redness surrounding and still some open wound at center, distal laceration well healed; no active drainage but small amount of dried yellow to proximal aspect of wound  Cardiovascular:     Rate and Rhythm: Normal rate.  Pulmonary:     Effort: Pulmonary effort is normal.  Skin:    General: Skin is warm and dry.  Neurological:     Mental Status: He is alert and oriented to person, place, and time.       UC Treatments / Results  Labs (all labs ordered are listed, but only abnormal results are displayed) Labs Reviewed - No data to display  EKG   Radiology No results found.  Procedures Procedures (including critical care time)  Medications Ordered in UC Medications - No data to display  Initial Impression / Assessment and Plan / UC Course  I have reviewed the triage vital signs and the nursing notes.  Pertinent labs & imaging results that were available during my care of the patient were reviewed by me and considered in my medical decision making (see chart for details).     1 suture at center of wound left in place due to incomplete closure still; redness and swelling to wound concerning for infection, 1 week of keflex provided. Return in 3-4 days for removal of final suture. Return precautions provided. Patient verbalized understanding and agreeable to plan.   Final Clinical Impressions(s) / UC Diagnoses   Final diagnoses:  Visit for suture removal  Wound infection     Discharge Instructions     Complete course of antibiotics, I am concerned about infection to your wound.  Return in another 3-4 days for removal of the last suture.  Return if any worsening of pain, redness or drainage from the wound or otherwise worsening.    ED Prescriptions    Medication Sig Dispense Auth. Provider   cephALEXin (KEFLEX) 500 MG capsule Take 1 capsule (500 mg total) by  mouth 4 (four) times daily for 7 days. 28 capsule Zigmund Gottron, NP     PDMP not reviewed this encounter.   Zigmund Gottron, NP 11/28/19 1626

## 2019-11-28 NOTE — ED Triage Notes (Signed)
Patient here for suture removal to his scalp.  Patient denies any pain.

## 2019-12-12 DIAGNOSIS — Z20822 Contact with and (suspected) exposure to covid-19: Secondary | ICD-10-CM | POA: Diagnosis not present

## 2019-12-31 DIAGNOSIS — E785 Hyperlipidemia, unspecified: Secondary | ICD-10-CM | POA: Diagnosis not present

## 2019-12-31 DIAGNOSIS — E1169 Type 2 diabetes mellitus with other specified complication: Secondary | ICD-10-CM | POA: Diagnosis not present

## 2019-12-31 DIAGNOSIS — N1832 Chronic kidney disease, stage 3b: Secondary | ICD-10-CM | POA: Diagnosis not present

## 2019-12-31 DIAGNOSIS — D638 Anemia in other chronic diseases classified elsewhere: Secondary | ICD-10-CM | POA: Diagnosis not present

## 2019-12-31 DIAGNOSIS — N183 Chronic kidney disease, stage 3 unspecified: Secondary | ICD-10-CM | POA: Diagnosis not present

## 2020-01-07 ENCOUNTER — Other Ambulatory Visit: Payer: Self-pay | Admitting: Family Medicine

## 2020-01-07 ENCOUNTER — Other Ambulatory Visit (HOSPITAL_COMMUNITY): Payer: Self-pay | Admitting: Family Medicine

## 2020-01-07 DIAGNOSIS — E875 Hyperkalemia: Secondary | ICD-10-CM | POA: Diagnosis not present

## 2020-01-07 DIAGNOSIS — E1169 Type 2 diabetes mellitus with other specified complication: Secondary | ICD-10-CM | POA: Diagnosis not present

## 2020-01-07 DIAGNOSIS — R1900 Intra-abdominal and pelvic swelling, mass and lump, unspecified site: Secondary | ICD-10-CM | POA: Diagnosis not present

## 2020-01-07 DIAGNOSIS — E785 Hyperlipidemia, unspecified: Secondary | ICD-10-CM | POA: Diagnosis not present

## 2020-01-07 DIAGNOSIS — I152 Hypertension secondary to endocrine disorders: Secondary | ICD-10-CM | POA: Diagnosis not present

## 2020-01-07 DIAGNOSIS — N1832 Chronic kidney disease, stage 3b: Secondary | ICD-10-CM

## 2020-01-07 DIAGNOSIS — E1159 Type 2 diabetes mellitus with other circulatory complications: Secondary | ICD-10-CM | POA: Diagnosis not present

## 2020-01-07 DIAGNOSIS — R1903 Right lower quadrant abdominal swelling, mass and lump: Secondary | ICD-10-CM | POA: Diagnosis not present

## 2020-01-07 DIAGNOSIS — D638 Anemia in other chronic diseases classified elsewhere: Secondary | ICD-10-CM | POA: Diagnosis not present

## 2020-01-08 DIAGNOSIS — D485 Neoplasm of uncertain behavior of skin: Secondary | ICD-10-CM | POA: Diagnosis not present

## 2020-01-08 DIAGNOSIS — C4401 Basal cell carcinoma of skin of lip: Secondary | ICD-10-CM | POA: Diagnosis not present

## 2020-01-08 DIAGNOSIS — D2261 Melanocytic nevi of right upper limb, including shoulder: Secondary | ICD-10-CM | POA: Diagnosis not present

## 2020-01-08 DIAGNOSIS — Z85828 Personal history of other malignant neoplasm of skin: Secondary | ICD-10-CM | POA: Diagnosis not present

## 2020-01-08 DIAGNOSIS — D3701 Neoplasm of uncertain behavior of lip: Secondary | ICD-10-CM | POA: Diagnosis not present

## 2020-01-08 DIAGNOSIS — X32XXXA Exposure to sunlight, initial encounter: Secondary | ICD-10-CM | POA: Diagnosis not present

## 2020-01-08 DIAGNOSIS — D2272 Melanocytic nevi of left lower limb, including hip: Secondary | ICD-10-CM | POA: Diagnosis not present

## 2020-01-08 DIAGNOSIS — D2271 Melanocytic nevi of right lower limb, including hip: Secondary | ICD-10-CM | POA: Diagnosis not present

## 2020-01-08 DIAGNOSIS — L570 Actinic keratosis: Secondary | ICD-10-CM | POA: Diagnosis not present

## 2020-01-08 DIAGNOSIS — D2262 Melanocytic nevi of left upper limb, including shoulder: Secondary | ICD-10-CM | POA: Diagnosis not present

## 2020-01-13 DIAGNOSIS — E1142 Type 2 diabetes mellitus with diabetic polyneuropathy: Secondary | ICD-10-CM | POA: Diagnosis not present

## 2020-01-13 DIAGNOSIS — E785 Hyperlipidemia, unspecified: Secondary | ICD-10-CM | POA: Diagnosis not present

## 2020-01-13 DIAGNOSIS — E1169 Type 2 diabetes mellitus with other specified complication: Secondary | ICD-10-CM | POA: Diagnosis not present

## 2020-01-13 DIAGNOSIS — I152 Hypertension secondary to endocrine disorders: Secondary | ICD-10-CM | POA: Diagnosis not present

## 2020-01-13 DIAGNOSIS — E1159 Type 2 diabetes mellitus with other circulatory complications: Secondary | ICD-10-CM | POA: Diagnosis not present

## 2020-01-13 DIAGNOSIS — Z794 Long term (current) use of insulin: Secondary | ICD-10-CM | POA: Diagnosis not present

## 2020-01-14 ENCOUNTER — Encounter: Payer: Self-pay | Admitting: Neurology

## 2020-01-14 ENCOUNTER — Ambulatory Visit: Payer: PPO | Admitting: Neurology

## 2020-01-14 ENCOUNTER — Other Ambulatory Visit: Payer: Self-pay

## 2020-01-14 VITALS — BP 142/57 | HR 73 | Resp 20 | Ht 63.0 in | Wt 205.0 lb

## 2020-01-14 DIAGNOSIS — G3184 Mild cognitive impairment, so stated: Secondary | ICD-10-CM

## 2020-01-14 NOTE — Patient Instructions (Signed)
1. Schedule repeat Neurocognitive testing with Dr. Milbert Coulter  2. Discuss with PCP increasing Escitalopram to 20mg  daily, continue Donepezil 10mg  daily  3. Follow-up after memory testing, call for any concerns   You have been referred for a neuropsychological evaluation (i.e., evaluation of memory and thinking abilities). Please bring someone with you to this appointment if possible, as it is helpful for the doctor to hear from both you and another adult who knows you well. Please bring eyeglasses and hearing aids if you wear them.    The evaluation will take approximately 3 hours and has two parts:   . The first part is a clinical interview with the neuropsychologist (Dr. or Dr. ). During the interview, the neuropsychologist will speak with you and the individual you brought to the appointment.    . The second part of the evaluation is testing with the doctor's technician Milbert Coulter or Roseanne Reno). During the testing, the technician will ask you to remember different types of material, solve problems, and answer some questionnaires. Your family member will not be present for this portion of the evaluation.   Please note: We must reserve several hours of the neuropsychologist's time and the psychometrician's time for your evaluation appointment. As such, there is a No-Show fee of $100. If you are unable to attend any of your appointments, please contact our office as soon as possible to reschedule.   RECOMMENDATIONS FOR ALL PATIENTS WITH MEMORY PROBLEMS: 1. Continue to exercise (Recommend 30 minutes of walking everyday, or 3 hours every week) 2. Increase social interactions - continue going to [place] and enjoy social gatherings with friends and family 3. Eat healthy, avoid fried foods and eat more fruits and vegetables 4. Maintain adequate blood pressure, blood sugar, and blood cholesterol level. Reducing the risk of stroke and cardiovascular disease also helps promoting better memory. 5. Avoid  stressful situations. Live a simple life and avoid aggravations. Organize your time and prepare for the next day in anticipation. 6. Sleep well, avoid any interruptions of sleep and avoid any distractions in the bedroom that may interfere with adequate sleep quality 7. Avoid sugar, avoid sweets as there is a strong link between excessive sugar intake, diabetes, and cognitive impairment The Mediterranean diet has been shown to help patients reduce the risk of progressive memory disorders and reduces cardiovascular risk. This includes eating fish, eat fruits and green leafy vegetables, nuts like almonds and hazelnuts, walnuts, and also use olive oil. Avoid fast foods and fried foods as much as possible. Avoid sweets and sugar as sugar use has been linked to worsening of memory function.

## 2020-01-14 NOTE — Progress Notes (Signed)
NEUROLOGY FOLLOW UP OFFICE NOTE  [name] [id number] 03-03-1940  HISTORY OF PRESENT ILLNESS: I had the pleasure of seeing Ronald Wallace in follow-up in the neurology clinic on 01/14/2020.  The patient was last seen 7 months ago for Mild Cognitive Impairment. He is again accompanied by his daughter, his wife on speakerphone who help supplement the history today.  Records and images were personally reviewed where available.  Since his last visit, he feels his memory is the same. His wife and daughter agree, he is doing pretty good. Sometimes he brings up something and does not remember people, but "not real bad." He drives minimally with no issues, his daughter rode with him and felt he did fine, family closely monitors. He manages his own medications and does well. He keeps a record of their bills, his wife pays them. He reports his mood is good until he gets upset. Family notes he gets upset and agitated easily. He is on Lexapro 10mg  daily and Donepezil 10mg  daily without side effects. No paranoia or hallucinations. He is independent with dressing and bathing. He denies any change in headaches, he "has had them all my life on a daily occurrence," but not bad lately. No dizziness, vision changes. He fell off the bed 2 months ago. His glucose levels have improved, last HbA1c was 5. He was in the hospital 2-3 months ago for diverticulosis/abdominal pain, he did not remember this initially, his daughter reminded him.   History on Initial Assessment 06/18/2019: This is a 79 year old right-handed man with a history of hypertension, hyperlipidemia, diabetes, CAD, CKD, bradycardia, anxiety, depression, presenting for evaluation of memory loss. His daughter,as well as wife on speakerphone, are present to provide additional information. Records were reviewed. He was seen 6 days ago at [facility] by neurologist Dr. 62, however they would still like to proceed with visit today for the same concern.  SLUMS score was 18/30.  Bloodwork was ordered, he is scheduled for an MRI brain with NeuroQuant sequence. His family feels that memory changes started only a month ago. He became tearful stating that "I cannot remember things," making him frustrated. He states "some things I can remember, but others I do not." He would forget a conversation from 5 minutes prior. He used to remember announcer names well. He lives with his wife who has to double check behind him on his insulin, telling him what dosage to take. He writes down bills and his wife pays them. He got lost driving twice in the past month. He got lost driving to their granddaughter's graduation, he apparently got very upset/agitated. He does not remember this. He does recall getting lost driving to get gas. He states he "just about quit driving." Family now has a GPS tracker, his wife is always in the car with him now. He is independent with dressing and bathing.  His daughter reports that he used to exercise more, then with the pandemic he got depressed. Family noticed that he did not want to be around his grandchildren anymore, getting easily agitated. Some people say something and it "sends him off to another pedestal." He can be so sweet sometimes then curl up and not want to talk to anybody. He has always had issues with his siblings, "always been a part of him," this seems to be getting better. They do feel that since memory changes started, he is doing better with other people. Paxil was recently changed to Lexapro, which he has been  taking for only 2 days. No paranoia or hallucinations. He was started on Donepezil 5mg  daily 3 weeks ago which he is overall tolerating. No family history of dementia. He does not drink alcohol. He denies any significant head injuries. He had a syncopal episode in 2018 where he woke up on the ground at work. I personally reviewed MRI brain with and without contrast done 11/2016 which did not show any acute changes. There  was mild diffuse atrophy and mild to moderate chronic microvascular disease.   He used to have frequent headaches when he was working and they quieted down, but recently come back "like they used to be." There is no associated nausea/vomiting, photo/phonophobia. Family thinks it is more stress-related. He denies any dizziness, diplopia, dysarthria/dysphagia, bowel/bladder dysfunction, anosmia. He has chronic neck and back pain. He has some tingling and numbness in his hands and feet. He has occasional hand tremors. Sleep is pretty good. They deny any REM behavior disorder, but he has fallen out of bed while sleeping. This was initially happening every 2-3 weeks, but has quieted down, last occurred 3 weeks ago. Family reports he used to exercise more but stopped when the pandemic hit.  Laboratory Data: MRI brain without contrast done 06/2019 did not show any acute changes. There was moderate chronic microvascular disease, diffuse volume loss with whole brain volume at 2nd percentile.  Neuropsychological testing done in 07/2019 noted that testing must be interpreted cautiously as he was tearful and visibly upset throughout much of the evaluation. He did not show clear memory storage problems and instead had notable encoding issues and relatively preserved retention of information across time on several tasks. He screened positive for depression. Diagnosis was Mild Cognitive Impairment, etiology unclear, possibly mixed AD and vascular, with interference from depression and anxiety.     PAST MEDICAL HISTORY: Past Medical History:  Diagnosis Date  . Anemia   . Anxiety   . Arthritis   . Bradycardia   . CAD (coronary artery disease)    a. 02/2014 Neg Ex MV, EF 65%;  b. 06/2015 Abnl MV ([facility])-->Cath ([facility]): LM nl, LAD 30p/m, LCX 30p, OM2 50p, RCA 100p w/ L-->R collats, nl EF-->Med Rx; c. 10/2017 MV: No isch/scar; d. 11/2017 Cath: LM nl, LAD 45p/m, LCX 30ost, OM2 30, RCA 100 CTO, L->R collats. PA  31/16/22, PCWP 11.  . CKD (chronic kidney disease)   . Depression   . Diabetes mellitus, type II, insulin dependent ([place])    With neuropathy  . Diverticulitis   . Essential hypertension   . Hyperlipidemia   . Neuropathy   . Obesity   . Osteoporosis   . Pure hypercholesterolemia   . Shortness of breath dyspnea   . Syncope and collapse    a. 02/2014 Carotid Dopplers: 1-39% bilateral ICA with normal Subclavian & vertebral arteries; b. 02/2014 2 D Echo: EF 50-55%, Gr 1 DD, Mild MR & LA dilation -- was bradycardic in 40s; c. 04/2014 Event monitor: RSR/SA/SB. Occas PVC's. No AFib.    MEDICATIONS: Current Outpatient Medications on File Prior to Visit  Medication Sig Dispense Refill  . acetaminophen (TYLENOL) 325 MG tablet Take 650 mg by mouth every 6 (six) hours as needed.    Marland Kitchen aspirin EC 81 MG tablet Take 81 mg by mouth daily.    . calcium carbonate (OS-CAL) 600 MG TABS tablet Take 600 mg by mouth daily with breakfast.    . cholecalciferol (VITAMIN D) 1000 UNITS tablet Take 2,000 Units by mouth daily.     Marland Kitchen  donepezil (ARICEPT) 10 MG tablet Take 10 mg by mouth daily.    Marland Kitchen. escitalopram (LEXAPRO) 10 MG tablet Take 1 tablet by mouth daily.    . ferrous sulfate 325 (65 FE) MG tablet Take 325 mg by mouth daily with breakfast.    . gabapentin (NEURONTIN) 300 MG capsule Take 300-600 mg by mouth See admin instructions. Take 1 capsule (300 mg) by mouth in the morning & 2 capsules (600 mg) by mouth every night.    . hydrochlorothiazide (MICROZIDE) 12.5 MG capsule Take 12.5 mg by mouth daily with breakfast.    . LEVEMIR FLEXTOUCH 100 UNIT/ML FlexPen Inject 40 Units into the skin at bedtime.     Marland Kitchen. losartan (COZAAR) 50 MG tablet Take 50 mg by mouth daily.     . metFORMIN (GLUCOPHAGE) 500 MG tablet Take 250 mg by mouth 2 (two) times daily with a meal.    . NOVOLOG 100 UNIT/ML injection Inject 20-38 Units into the skin 3 (three) times daily with meals. Sliding scale insulin base rates= 20 units in the  morning, 26 units with lunch, 26 units with supper.    . simvastatin (ZOCOR) 20 MG tablet Take 20 mg by mouth daily.    . tamsulosin (FLOMAX) 0.4 MG CAPS capsule Take 0.4 mg by mouth daily. Takes 2 each morning.    . Vibegron (GEMTESA) 75 MG TABS Take 1 tablet by mouth daily. 30 tablet 11  . isosorbide mononitrate (IMDUR) 30 MG 24 hr tablet Take 1 tablet by mouth once daily 90 tablet 2   No current facility-administered medications on file prior to visit.    ALLERGIES: Allergies  Allergen Reactions  . Amlodipine     Other reaction(s): Other (See Comments) Peripheral edema    FAMILY HISTORY: Family History  Problem Relation Age of Onset  . Heart attack Mother   . Breast cancer Mother   . Lung cancer Father     SOCIAL HISTORY: Social History   Socioeconomic History  . Marital status: Married    Spouse name: Not on file  . Number of children: Not on file  . Years of education: Not on file  . Highest education level: Not on file  Occupational History  . Not on file  Tobacco Use  . Smoking status: Former Smoker    Years: 1.00    Types: Pipe    Quit date: 1965    Years since quitting: 57.0  . Smokeless tobacco: Never Used  Vaping Use  . Vaping Use: Never used  Substance and Sexual Activity  . Alcohol use: No  . Drug use: No  . Sexual activity: Not on file  Other Topics Concern  . 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   Married - at least 1 daughter.   Never Smoked   Usually very active, but no exercise routine.   Right handed    Social Determinants of Health   Financial Resource Strain: Not on file  Food Insecurity: Not on file  Transportation Needs: Not on file  Physical Activity: Not on file  Stress: Not on file  Social Connections: Not on file  Intimate Partner Violence: Not on file     PHYSICAL EXAM: Vitals:   01/14/20 1049  BP: (!) 142/57  Pulse: 73  Resp: 20  SpO2: 95%   General: No acute  distress Head:  Normocephalic/atraumatic Skin/Extremities: No rash, no edema Neurological Exam: alert and oriented to person, place, and  time. No aphasia or dysarthria. Fund of knowledge is appropriate.  Recent and remote memory are intact, 3/3 delayed recall.  Attention and concentration are reduced, 2/5 WORLD backwards. Cranial nerves: Pupils equal, round. Extraocular movements intact with no nystagmus. Visual fields full.  No facial asymmetry.  Motor: Bulk and tone normal, muscle strength 5/5 throughout with no pronator drift.   Finger to nose testing intact.  Gait narrow-based and steady, no ataxia   IMPRESSION: This is a 79 yo RH man with a history of hypertension, hyperlipidemia, diabetes, CAD, CKD, bradycardia, anxiety, depression, with Mild Cognitive Impairment, possibly mixed AD and vascular. MRI brain did not show any acute changes, there was diffuse volume loss, moderate chronic microvascular disease. Neuropsychological evaluation also indicated overlaying depression and anxiety as contributing factors, discussed increasing Lexapro to 20mg  daily, they will discuss with his PCP. Continue Donepezil 10mg  daily. Continue control of vascular risk factors, physical exercise, and brain stimulation exercises for brain health. He will be scheduled for repeat Neuropsychological testing in July 2022 to assess trajectory. Follow-up after testing, they know to call for any changes.   Thank you for allowing me to participate in his care.  Please do not hesitate to call for any questions or concerns.   Ellouise Newer, M.D.   CC: Dr. Netty Starring

## 2020-01-15 DIAGNOSIS — F32A Depression, unspecified: Secondary | ICD-10-CM | POA: Diagnosis not present

## 2020-01-15 DIAGNOSIS — F419 Anxiety disorder, unspecified: Secondary | ICD-10-CM | POA: Diagnosis not present

## 2020-01-23 ENCOUNTER — Ambulatory Visit: Admission: RE | Admit: 2020-01-23 | Payer: HMO | Source: Ambulatory Visit

## 2020-01-23 ENCOUNTER — Other Ambulatory Visit: Payer: Self-pay

## 2020-01-23 ENCOUNTER — Ambulatory Visit
Admission: RE | Admit: 2020-01-23 | Discharge: 2020-01-23 | Disposition: A | Discharge status: Home / Self Care | Payer: HMO | Source: Ambulatory Visit | Attending: Family Medicine | Admitting: Family Medicine

## 2020-01-23 DIAGNOSIS — N1832 Chronic kidney disease, stage 3b: Secondary | ICD-10-CM | POA: Diagnosis not present

## 2020-01-23 DIAGNOSIS — K5730 Diverticulosis of large intestine without perforation or abscess without bleeding: Secondary | ICD-10-CM | POA: Diagnosis not present

## 2020-01-23 DIAGNOSIS — K429 Umbilical hernia without obstruction or gangrene: Secondary | ICD-10-CM | POA: Diagnosis not present

## 2020-01-23 DIAGNOSIS — K8689 Other specified diseases of pancreas: Secondary | ICD-10-CM | POA: Diagnosis not present

## 2020-01-23 DIAGNOSIS — R1900 Intra-abdominal and pelvic swelling, mass and lump, unspecified site: Secondary | ICD-10-CM | POA: Diagnosis not present

## 2020-01-23 DIAGNOSIS — R197 Diarrhea, unspecified: Secondary | ICD-10-CM | POA: Diagnosis not present

## 2020-01-27 ENCOUNTER — Ambulatory Visit: Payer: HMO

## 2020-01-29 DIAGNOSIS — X32XXXA Exposure to sunlight, initial encounter: Secondary | ICD-10-CM | POA: Diagnosis not present

## 2020-01-29 DIAGNOSIS — L570 Actinic keratosis: Secondary | ICD-10-CM | POA: Diagnosis not present

## 2020-02-11 DIAGNOSIS — R222 Localized swelling, mass and lump, trunk: Secondary | ICD-10-CM | POA: Diagnosis not present

## 2020-03-17 DIAGNOSIS — C4401 Basal cell carcinoma of skin of lip: Secondary | ICD-10-CM | POA: Diagnosis not present

## 2020-03-29 DIAGNOSIS — E119 Type 2 diabetes mellitus without complications: Secondary | ICD-10-CM | POA: Diagnosis not present

## 2020-03-31 DIAGNOSIS — N1832 Chronic kidney disease, stage 3b: Secondary | ICD-10-CM | POA: Diagnosis not present

## 2020-03-31 DIAGNOSIS — E785 Hyperlipidemia, unspecified: Secondary | ICD-10-CM | POA: Diagnosis not present

## 2020-03-31 DIAGNOSIS — E1169 Type 2 diabetes mellitus with other specified complication: Secondary | ICD-10-CM | POA: Diagnosis not present

## 2020-03-31 DIAGNOSIS — D638 Anemia in other chronic diseases classified elsewhere: Secondary | ICD-10-CM | POA: Diagnosis not present

## 2020-04-07 DIAGNOSIS — I152 Hypertension secondary to endocrine disorders: Secondary | ICD-10-CM | POA: Diagnosis not present

## 2020-04-07 DIAGNOSIS — I2510 Atherosclerotic heart disease of native coronary artery without angina pectoris: Secondary | ICD-10-CM | POA: Diagnosis not present

## 2020-04-07 DIAGNOSIS — E669 Obesity, unspecified: Secondary | ICD-10-CM | POA: Diagnosis not present

## 2020-04-07 DIAGNOSIS — D638 Anemia in other chronic diseases classified elsewhere: Secondary | ICD-10-CM | POA: Diagnosis not present

## 2020-04-07 DIAGNOSIS — E1169 Type 2 diabetes mellitus with other specified complication: Secondary | ICD-10-CM | POA: Diagnosis not present

## 2020-04-07 DIAGNOSIS — F32A Depression, unspecified: Secondary | ICD-10-CM | POA: Diagnosis not present

## 2020-04-07 DIAGNOSIS — R4189 Other symptoms and signs involving cognitive functions and awareness: Secondary | ICD-10-CM | POA: Diagnosis not present

## 2020-04-07 DIAGNOSIS — E1159 Type 2 diabetes mellitus with other circulatory complications: Secondary | ICD-10-CM | POA: Diagnosis not present

## 2020-04-07 DIAGNOSIS — E785 Hyperlipidemia, unspecified: Secondary | ICD-10-CM | POA: Diagnosis not present

## 2020-04-07 DIAGNOSIS — Z0000 Encounter for general adult medical examination without abnormal findings: Secondary | ICD-10-CM | POA: Diagnosis not present

## 2020-04-07 DIAGNOSIS — F419 Anxiety disorder, unspecified: Secondary | ICD-10-CM | POA: Diagnosis not present

## 2020-04-07 DIAGNOSIS — N1832 Chronic kidney disease, stage 3b: Secondary | ICD-10-CM | POA: Diagnosis not present

## 2020-04-08 ENCOUNTER — Other Ambulatory Visit: Payer: Self-pay | Admitting: Nephrology

## 2020-04-08 DIAGNOSIS — R609 Edema, unspecified: Secondary | ICD-10-CM | POA: Diagnosis not present

## 2020-04-08 DIAGNOSIS — D631 Anemia in chronic kidney disease: Secondary | ICD-10-CM | POA: Diagnosis not present

## 2020-04-08 DIAGNOSIS — E1169 Type 2 diabetes mellitus with other specified complication: Secondary | ICD-10-CM | POA: Diagnosis not present

## 2020-04-08 DIAGNOSIS — I10 Essential (primary) hypertension: Secondary | ICD-10-CM | POA: Diagnosis not present

## 2020-04-08 DIAGNOSIS — R809 Proteinuria, unspecified: Secondary | ICD-10-CM

## 2020-04-08 DIAGNOSIS — R8290 Unspecified abnormal findings in urine: Secondary | ICD-10-CM

## 2020-04-08 DIAGNOSIS — E782 Mixed hyperlipidemia: Secondary | ICD-10-CM | POA: Diagnosis not present

## 2020-04-08 DIAGNOSIS — E1122 Type 2 diabetes mellitus with diabetic chronic kidney disease: Secondary | ICD-10-CM | POA: Diagnosis not present

## 2020-04-08 DIAGNOSIS — N1832 Chronic kidney disease, stage 3b: Secondary | ICD-10-CM

## 2020-04-23 ENCOUNTER — Other Ambulatory Visit: Payer: Self-pay | Admitting: Cardiovascular Disease

## 2020-04-23 NOTE — Telephone Encounter (Signed)
Scheduled visit .      *STAT* If patient is at the pharmacy, call can be transferred to refill team.   1. Which medications need to be refilled? (please list name of each medication and dose if known) Isosorbide 30 mg po q d   2. Which pharmacy/location (including street and city if local pharmacy) is medication to be sent to?  [place]   3. Do they need a 30 day or 90 day supply? [place]

## 2020-04-23 NOTE — Telephone Encounter (Signed)
Please call pt to schedule overdue annual follow-up with Dr. Rockey Situ for refills. Thanks!

## 2020-04-23 NOTE — Telephone Encounter (Signed)
LVM for patient to call back and schedule

## 2020-04-27 ENCOUNTER — Other Ambulatory Visit: Payer: Self-pay

## 2020-04-27 ENCOUNTER — Telehealth: Payer: Self-pay | Admitting: Nurse Practitioner

## 2020-04-27 ENCOUNTER — Ambulatory Visit: Payer: HMO | Admitting: Nurse Practitioner

## 2020-04-27 ENCOUNTER — Encounter: Payer: Self-pay | Admitting: Nurse Practitioner

## 2020-04-27 VITALS — BP 120/60 | HR 45 | Ht 68.0 in | Wt 205.5 lb

## 2020-04-27 DIAGNOSIS — I10 Essential (primary) hypertension: Secondary | ICD-10-CM

## 2020-04-27 DIAGNOSIS — E785 Hyperlipidemia, unspecified: Secondary | ICD-10-CM

## 2020-04-27 DIAGNOSIS — R0600 Dyspnea, unspecified: Secondary | ICD-10-CM | POA: Diagnosis not present

## 2020-04-27 DIAGNOSIS — R001 Bradycardia, unspecified: Secondary | ICD-10-CM

## 2020-04-27 DIAGNOSIS — N183 Chronic kidney disease, stage 3 unspecified: Secondary | ICD-10-CM | POA: Diagnosis not present

## 2020-04-27 DIAGNOSIS — I2510 Atherosclerotic heart disease of native coronary artery without angina pectoris: Secondary | ICD-10-CM

## 2020-04-27 DIAGNOSIS — R0609 Other forms of dyspnea: Secondary | ICD-10-CM

## 2020-04-27 NOTE — Progress Notes (Signed)
Office Visit    Patient Name: Ronald Wallace Date of Encounter: 04/27/2020  Primary Care Provider:  [name], MD Primary Cardiologist:  Ida Rogue, MD  Chief Complaint    80 year old male with history of CAD, type 2 diabetes mellitus, hypertension, hyperlipidemia, neuropathy, and syncope who presents for follow-up related to worsening dyspnea on exertion.  Past Medical History    Past Medical History:  Diagnosis Date  . Anemia   . Anxiety   . Arthritis   . Bradycardia   . CAD (coronary artery disease)    a. 02/2014 Neg Ex MV, EF 65%;  b. 06/2015 Abnl MV ([facility])-->Cath ([facility]): LM nl, LAD 30p/m, LCX 30p, OM2 50p, RCA 100p w/ L-->R collats, nl EF-->Med Rx; c. 10/2017 MV: No isch/scar; d. 11/2017 Cath: LM nl, LAD 45p/m, LCX 30ost, OM2 30, RCA 100 CTO, L->R collats. PA 31/16/22, PCWP 11.  . CKD (chronic kidney disease), stage III ([place])   . Depression   . Diabetes mellitus, type II, insulin dependent ([place])    With neuropathy  . Diverticulitis   . Essential hypertension   . Hyperlipidemia   . Neuropathy   . Obesity   . Osteoporosis   . Pure hypercholesterolemia   . Shortness of breath dyspnea   . Syncope and collapse    a. 02/2014 Carotid Dopplers: 1-39% bilateral ICA with normal Subclavian & vertebral arteries; b. 02/2014 2 D Echo: EF 50-55%, Gr 1 DD, Mild MR & LA dilation -- was bradycardic in 40s; c. 04/2014 Event monitor: RSR/SA/SB. Occas PVC's. No AFib.   Past Surgical History:  Procedure Laterality Date  . CARDIAC CATHETERIZATION Left 07/07/2015   Procedure: Left Heart Cath and Coronary Angiography;  Surgeon: Isaias Cowman, MD;  Location: [place] [facility];  Service: Cardiovascular;  Laterality: Left;  . CARPAL TUNNEL RELEASE    . CATARACT EXTRACTION Right   . CATARACT EXTRACTION W/PHACO Left 10/06/2015   Procedure: CATARACT EXTRACTION PHACO AND INTRAOCULAR LENS PLACEMENT (IOC);  Surgeon: Estill Cotta, MD;  Location: [facility];  Service:  Ophthalmology;  Laterality: Left;  [place] 01:42 AP% 25.1 CDE 45.92 Fluid pack lot # [id number]  . COLONOSCOPY    . COLONOSCOPY WITH PROPOFOL N/A 12/04/2018   Procedure: COLONOSCOPY WITH PROPOFOL;  Surgeon: Toledo, Benay Pike, MD;  Location: [facility];  Service: Gastroenterology;  Laterality: N/A;  . NM MYOVIEW LTD  03/13/2014   LOW RISK - positive EKG but negative imaging: 2 mm ST Depression in V4 & V5 but No scintigraphic evidence of Ischemia or Infarct o  . RIGHT/LEFT HEART CATH AND CORONARY ANGIOGRAPHY N/A 11/21/2017   Procedure: RIGHT/LEFT HEART CATH AND CORONARY ANGIOGRAPHY;  Surgeon: Minna Merritts, MD;  Location: [place] [facility];  Service: Cardiovascular;  Laterality: N/A;  . TRANSTHORACIC ECHOCARDIOGRAM  03/12/2014   EF 50-55%, Gr 1 DD, no RWMA, Mlid MR & LA dil, Normla RV and bradycardic 40s -    Allergies  Allergies  Allergen Reactions  . Amlodipine     Other reaction(s): Other (See Comments) Peripheral edema    History of Present Illness    80 year old male with above past medical history including CAD, type 2 diabetes mellitus, hypertension, hyperlipidemia, neuropathy, stage III chronic kidney disease, and syncope.  He was previously evaluated for syncope in early 2016 with an echocardiogram at that time showing normal LV function, carotid ultrasound without significant disease, and event monitoring showing no significant arrhythmias.  He underwent stress testing in 2017 in the setting of progressive  dyspnea, which was followed by catheterization revealing normal LV function, chronic total occlusion of the RCA, and otherwise nonobstructive disease.  He was medically managed.  In the fall 2019, he had worsening dyspnea on exertion and underwent stress testing in October 2019 showing no significant ischemia.  Because of progressive symptoms, he underwent diagnostic catheterization in November 2019 which revealed stable, moderate LAD and circumflex disease with a chronic total  occlusion of the right coronary artery, and left to right collaterals.  Right heart pressures were relatively normal and medical therapy along with cardiac rehab enrollment were advised.  He was last seen in cardiology clinic in March 2021 at which time he continued to report dyspnea on exertion and he was advised to begin a regular exercise routine as well as dietary changes with a 10 pound weight loss goal.  Stress testing was offered but pt declined. Over the past 2 years, he has been noted to have rising creatinine and CKD 3.  He is not followed by nephrology.  Most recent creatinine was 1.9 on March 31, 2020.  Over the past year, he has noted increasing dyspnea on exertion.  Noting that he can only walk the length of his driveway once prior to having to stop and rest.  He feels like a year ago he could have done this between 3 and 6 times.  He does not experience chest pain.  He denies palpitations, PND, orthopnea, or early satiety.  Does have chronic, mild lower extremity swelling which is worse as the day progresses and does seem to improve when he keeps his legs elevated.  His heart rate on ECG today is 45.  He denies any presyncope or syncope.  With ambulation in clinic today, rates rose into the 80s.  Home Medications    Prior to Admission medications   Medication Sig Start Date End Date Taking? Authorizing Provider  acetaminophen (TYLENOL) 325 MG tablet Take 650 mg by mouth every 6 (six) hours as needed.    [provider]  aspirin EC 81 MG tablet Take 81 mg by mouth daily.    [provider]  calcium carbonate (OS-CAL) 600 MG TABS tablet Take 600 mg by mouth daily with breakfast.    [provider]  cholecalciferol (VITAMIN D) 1000 UNITS tablet Take 2,000 Units by mouth daily.     [provider]  donepezil (ARICEPT) 10 MG tablet Take 10 mg by mouth daily. 08/15/19   [provider]  escitalopram (LEXAPRO) 10 MG tablet Take 1 tablet by mouth daily.  07/23/19   [provider]  ferrous sulfate 325 (65 FE) MG tablet Take 325 mg by mouth daily with breakfast.    [provider]  gabapentin (NEURONTIN) 300 MG capsule Take 300-600 mg by mouth See admin instructions. Take 1 capsule (300 mg) by mouth in the morning & 2 capsules (600 mg) by mouth every night.    [provider]  hydrochlorothiazide (MICROZIDE) 12.5 MG capsule Take 12.5 mg by mouth daily with breakfast.    [provider]  isosorbide mononitrate (IMDUR) 30 MG 24 hr tablet Take 1 tablet by mouth once daily 04/23/20   Minna Merritts, MD  LEVEMIR FLEXTOUCH 100 UNIT/ML FlexPen Inject 40 Units into the skin at bedtime.  07/02/19   [provider]  losartan (COZAAR) 50 MG tablet Take 50 mg by mouth daily.  06/14/17   [provider]  metFORMIN (GLUCOPHAGE) 500 MG tablet Take 250 mg by mouth 2 (two)  times daily with a meal.    [provider]  NOVOLOG 100 UNIT/ML injection Inject 20-38 Units into the skin 3 (three) times daily with meals. Sliding scale insulin base rates= 20 units in the morning, 26 units with lunch, 26 units with supper. 05/06/15   [provider]  simvastatin (ZOCOR) 20 MG tablet Take 20 mg by mouth daily.    [provider]  tamsulosin (FLOMAX) 0.4 MG CAPS capsule Take 0.4 mg by mouth daily. Takes 2 each morning.    [provider]  Vibegron (GEMTESA) 75 MG TABS Take 1 tablet by mouth daily. 07/31/19   Billey Co, MD    Review of Systems    Ongoing and progressive dyspnea on exertion.  He denies chest pain, palpitations, pnd, orthopnea, n, v, dizziness, syncope, edema, weight gain, or early satiety.  All other systems reviewed and are otherwise negative except as noted above.  Physical Exam    VS:  BP 120/60 (BP Location: Left Arm, Patient Position: Sitting, Cuff Size: Normal)   Pulse (!) 45   Ht 5\' 8"  (1.727 m)   Wt 205 lb 8 oz (93.2 kg)   SpO2 98%   BMI 31.25 kg/m  , BMI  Body mass index is 31.25 kg/m. GEN: Obese, in no acute distress. HEENT: normal. Neck: Supple, obese, difficult to gauge JVP, no carotid bruits, or masses. Cardiac: RRR, no murmurs, rubs, or gallops. No clubbing, cyanosis.  Trace bilat ankle edema.  Radials/PT 2+ and equal bilaterally.  Respiratory:  Respirations regular and unlabored, clear to auscultation bilaterally. GI: Obese, soft, nontender, nondistended, BS + x 4. MS: no deformity or atrophy. Skin: warm and dry, no rash. Neuro:  Strength and sensation are intact. Psych: Normal affect.  Accessory Clinical Findings    ECG personally reviewed by me today - sinus bradycardia, 45, nonspecific ST changes - no acute changes.  Labs dated March 31, 2020 Hemoglobin 11.4, hematocrit 34.6, WBC 6.9, platelets 156 Sodium 144, potassium 5.1, chloride 106, BUN 42, creatinine 1.9, glucose 125 Calcium 9.5, albumin 4.1, total protein 6.6 Total bilirubin 0.4, alkaline phosphatase 56, AST 15, ALT 15 Total cholesterol 99, triglycerides 111, HDL 35.7, LDL 41  Lab dated January 05, 2020 Hemoglobin A1c 7.5  Assessment & Plan    1.  Dyspnea on exertion: This is been a somewhat chronic problem but patient notes that is worsened in the past year.  When discussed last year, he went to defer stress testing and try a walking program but ongoing dyspnea has limited his success and dyspnea has progressed.  He has not had any chest discomfort.  He is relatively euvolemic on examination and normotensive.  He is bradycardic at rest but has normal chronotropic competence.  Previous evaluation included diagnostic catheterization in 2019 revealing stable, moderate CAD in the left coronary tree with a chronic total occlusion of the right coronary artery and left to right collaterals.  He is agreed to pursuing repeat stress testing.  The benefits (risk stratification, diagnosing coronary artery disease, treatment guidance) and risks [chest pain, dyspnea, cardiac  arrhythmias, dizziness, low blood pressure, allergic reaction, heart attack, and life-threatening complications (estimated to be 1 in 10,000)] were discussed in detail with [name] and he agrees to proceed.  2.  Coronary artery disease: As above, status post catheterization in 2019 revealing a chronic total occlusion of the right coronary artery with left-to-right collaterals and moderate, nonobstructive disease in the left coronary tree.  He has not been having  any chest pain but in the setting of progressive dyspnea, we will arrange for stress testing.  He remains on aspirin, nitrate, and statin therapy.  3.  Sinus bradycardia: Heart rate is 45 today.  Normal intervals without evidence of high-grade AV block.  I note on recent primary care and nephrology visits, he was in the low 50s.  He ambulated and heart rates rose appropriately into the high 80s.  He is not symptomatic and not on any AV nodal blocking agents.  Aricept is known to potentially contribute to bradycardia.  With normal chronotropic competence, I did not make any changes today though consideration may need to be given to finding an alternate medication to Aricept in the future.  4.  Essential hypertension: Stable.  5.  Hyperlipidemia: Remains on statin therapy with an LDL of 41 in March 2022.  6.  Type 2 diabetes mellitus: A1c 7.5 in December.  Insulin management per primary care.  7.  Stage III chronic kidney disease: Creatinine 1.9 in March.  Now followed by nephrology.  Remains on ARB therapy.  8.  Disposition: Follow-up stress testing with office follow-up shortly thereafter.  Murray Hodgkins, NP 04/27/2020, 12:48 PM

## 2020-04-27 NOTE — Patient Instructions (Signed)
Medication Instructions:  No changes  *If you need a refill on your cardiac medications before your next appointment, please call your pharmacy*   Lab Work: None  If you have labs (blood work) drawn today and your tests are completely normal, you will receive your results only by: Marland Kitchen MyChart Message (if you have MyChart) OR . A paper copy in the mail If you have any lab test that is abnormal or we need to change your treatment, we will call you to review the results.   Testing/Procedures: [place]  Your caregiver has ordered a Stress Test with nuclear imaging.   The purpose of this test is to evaluate the blood supply to your heart muscle. This procedure is referred to as a "Non-Invasive Stress Test." This is because other than having an IV started in your vein, nothing is inserted or "invades" your body. Cardiac stress tests are done to find areas of poor blood flow to the heart by determining the extent of coronary artery disease (CAD). Some patients exercise on a treadmill, which naturally increases the blood flow to your heart, while others who are  unable to walk on a treadmill due to physical limitations have a pharmacologic/chemical stress agent called Lexiscan . This medicine will mimic walking on a treadmill by temporarily increasing your coronary blood flow.   Please note: these test may take anywhere between 2-4 hours to complete  PLEASE REPORT TO [facility] AT THE FIRST DESK WILL DIRECT YOU WHERE TO GO  Date of Procedure:_____________________________________  Arrival Time for Procedure:______________________________  Instructions regarding medication:   _XX___ : Hold diabetes medication (Metformin) the morning of procedure  _XX___:  Hold hydrochlorothiazide the morning of procedure    PLEASE NOTIFY THE OFFICE AT LEAST 24 HOURS IN ADVANCE IF YOU ARE UNABLE TO KEEP YOUR APPOINTMENT.  [phone] AND  PLEASE NOTIFY NUCLEAR MEDICINE  AT [facility] AT LEAST 24 HOURS IN ADVANCE IF YOU ARE UNABLE TO KEEP YOUR APPOINTMENT. [phone]  How to prepare for your Myoview test:  1. Do not eat or drink after midnight 2. No caffeine for 24 hours prior to test 3. No smoking 24 hours prior to test. 4. Your medication may be taken with water.  If your doctor stopped a medication because of this test, do not take that medication. 5. Ladies, please do not wear dresses.  Skirts or pants are appropriate. Please wear a short sleeve shirt. 6. No perfume, cologne or lotion. 7. Wear comfortable walking shoes. No heels!    Follow-Up: At [facility], you and your health needs are our priority.  As part of our continuing mission to provide you with exceptional heart care, we have created designated Provider Care Teams.  These Care Teams include your primary Cardiologist (physician) and Advanced Practice Providers (APPs -  Physician Assistants and Nurse Practitioners) who all work together to provide you with the care you need, when you need it.   Your next appointment:   3 week(s)  The format for your next appointment:   In Person  Provider:   Ida Rogue, MD or Murray Hodgkins, NP

## 2020-04-27 NOTE — Telephone Encounter (Signed)
Patient wants to wait to schedule nuc and 3 week fu when he gets home with schedule.

## 2020-05-03 ENCOUNTER — Ambulatory Visit
Admission: RE | Admit: 2020-05-03 | Discharge: 2020-05-03 | Disposition: A | Discharge status: Home / Self Care | Payer: HMO | Source: Ambulatory Visit | Attending: Nephrology | Admitting: Nephrology

## 2020-05-03 ENCOUNTER — Telehealth: Payer: Self-pay | Admitting: Nurse Practitioner

## 2020-05-03 ENCOUNTER — Other Ambulatory Visit: Payer: Self-pay

## 2020-05-03 DIAGNOSIS — N1832 Chronic kidney disease, stage 3b: Secondary | ICD-10-CM | POA: Insufficient documentation

## 2020-05-03 DIAGNOSIS — R8290 Unspecified abnormal findings in urine: Secondary | ICD-10-CM | POA: Diagnosis not present

## 2020-05-03 DIAGNOSIS — N189 Chronic kidney disease, unspecified: Secondary | ICD-10-CM | POA: Diagnosis not present

## 2020-05-03 DIAGNOSIS — R809 Proteinuria, unspecified: Secondary | ICD-10-CM | POA: Diagnosis not present

## 2020-05-03 NOTE — Telephone Encounter (Signed)
Called patient, advised that they were scheduled for [place] today, and Stress is next week. She was questioning today's testing.  Patient advised of number for [facility] [place] to check with them on this.  Patient verbalized understanding,.

## 2020-05-03 NOTE — Telephone Encounter (Signed)
Patient wife is calling stating patient needs to take his insulin on the day of his stress test. Please call to discuss.

## 2020-05-06 DIAGNOSIS — N2581 Secondary hyperparathyroidism of renal origin: Secondary | ICD-10-CM | POA: Diagnosis not present

## 2020-05-06 DIAGNOSIS — E1122 Type 2 diabetes mellitus with diabetic chronic kidney disease: Secondary | ICD-10-CM | POA: Diagnosis not present

## 2020-05-06 DIAGNOSIS — R809 Proteinuria, unspecified: Secondary | ICD-10-CM | POA: Diagnosis not present

## 2020-05-06 DIAGNOSIS — D631 Anemia in chronic kidney disease: Secondary | ICD-10-CM | POA: Diagnosis not present

## 2020-05-06 DIAGNOSIS — I509 Heart failure, unspecified: Secondary | ICD-10-CM | POA: Diagnosis not present

## 2020-05-06 DIAGNOSIS — I10 Essential (primary) hypertension: Secondary | ICD-10-CM | POA: Diagnosis not present

## 2020-05-06 DIAGNOSIS — N1832 Chronic kidney disease, stage 3b: Secondary | ICD-10-CM | POA: Diagnosis not present

## 2020-05-11 ENCOUNTER — Other Ambulatory Visit: Payer: Self-pay

## 2020-05-11 ENCOUNTER — Encounter
Admission: RE | Admit: 2020-05-11 | Discharge: 2020-05-11 | Disposition: A | Discharge status: Home / Self Care | Payer: HMO | Source: Ambulatory Visit | Attending: Nurse Practitioner | Admitting: Nurse Practitioner

## 2020-05-11 DIAGNOSIS — R0609 Other forms of dyspnea: Secondary | ICD-10-CM

## 2020-05-11 DIAGNOSIS — R0600 Dyspnea, unspecified: Secondary | ICD-10-CM | POA: Insufficient documentation

## 2020-05-13 DIAGNOSIS — Z794 Long term (current) use of insulin: Secondary | ICD-10-CM | POA: Diagnosis not present

## 2020-05-13 DIAGNOSIS — E1142 Type 2 diabetes mellitus with diabetic polyneuropathy: Secondary | ICD-10-CM | POA: Diagnosis not present

## 2020-05-13 DIAGNOSIS — E785 Hyperlipidemia, unspecified: Secondary | ICD-10-CM | POA: Diagnosis not present

## 2020-05-13 DIAGNOSIS — E1159 Type 2 diabetes mellitus with other circulatory complications: Secondary | ICD-10-CM | POA: Diagnosis not present

## 2020-05-13 DIAGNOSIS — E039 Hypothyroidism, unspecified: Secondary | ICD-10-CM | POA: Diagnosis not present

## 2020-05-13 DIAGNOSIS — I152 Hypertension secondary to endocrine disorders: Secondary | ICD-10-CM | POA: Diagnosis not present

## 2020-05-13 DIAGNOSIS — E1169 Type 2 diabetes mellitus with other specified complication: Secondary | ICD-10-CM | POA: Diagnosis not present

## 2020-05-17 ENCOUNTER — Other Ambulatory Visit: Payer: Self-pay

## 2020-05-17 ENCOUNTER — Encounter
Admission: RE | Admit: 2020-05-17 | Discharge: 2020-05-17 | Disposition: A | Discharge status: Home / Self Care | Payer: HMO | Source: Ambulatory Visit | Attending: Nurse Practitioner | Admitting: Nurse Practitioner

## 2020-05-17 DIAGNOSIS — R0600 Dyspnea, unspecified: Secondary | ICD-10-CM | POA: Insufficient documentation

## 2020-05-17 MED ORDER — TECHNETIUM TC 99M TETROFOSMIN IV KIT
10.0000 | PACK | Freq: Once | INTRAVENOUS | Status: AC | PRN
  Administered 2020-05-17: 10 via INTRAVENOUS

## 2020-05-17 MED ORDER — TECHNETIUM TC 99M TETROFOSMIN IV KIT
32.0000 | PACK | Freq: Once | INTRAVENOUS | Status: AC | PRN
  Administered 2020-05-17: 32 via INTRAVENOUS

## 2020-05-17 MED ORDER — REGADENOSON 0.4 MG/5ML IV SOLN
0.4000 mg | Freq: Once | INTRAVENOUS | Status: AC
  Administered 2020-05-17: 0.4 mg via INTRAVENOUS

## 2020-05-18 LAB — NM MYOCAR MULTI W/SPECT W/WALL MOTION / EF
LV dias vol: 104 mL (ref 62–150)
LV sys vol: 34 mL
MPHR: 142 {beats}/min
Peak HR: 76 {beats}/min
Percent HR: 53 %
Rest HR: 49 {beats}/min
TID: 1.12

## 2020-05-20 ENCOUNTER — Other Ambulatory Visit: Payer: Self-pay

## 2020-05-20 ENCOUNTER — Ambulatory Visit: Payer: HMO | Admitting: Nurse Practitioner

## 2020-05-20 ENCOUNTER — Encounter: Payer: Self-pay | Admitting: Nurse Practitioner

## 2020-05-20 VITALS — BP 116/60 | HR 62 | Ht 68.0 in | Wt 201.0 lb

## 2020-05-20 DIAGNOSIS — I10 Essential (primary) hypertension: Secondary | ICD-10-CM | POA: Diagnosis not present

## 2020-05-20 DIAGNOSIS — I2510 Atherosclerotic heart disease of native coronary artery without angina pectoris: Secondary | ICD-10-CM | POA: Diagnosis not present

## 2020-05-20 DIAGNOSIS — N183 Chronic kidney disease, stage 3 unspecified: Secondary | ICD-10-CM | POA: Diagnosis not present

## 2020-05-20 DIAGNOSIS — E785 Hyperlipidemia, unspecified: Secondary | ICD-10-CM | POA: Diagnosis not present

## 2020-05-20 DIAGNOSIS — I5032 Chronic diastolic (congestive) heart failure: Secondary | ICD-10-CM | POA: Diagnosis not present

## 2020-05-20 MED ORDER — FUROSEMIDE 20 MG PO TABS: ORAL_TABLET | ORAL | 0 refills | Status: DC

## 2020-05-20 NOTE — Patient Instructions (Addendum)
Medication Instructions:  Your physician has recommended you make the following change in your medication:   1. STOP Hydrochlorothiazide  2. START Furosemide 20 mg and take 2 tablets (40 mg) daily for 2 days. THEN decrease to 1 tablet (20 mg) daily   *If you need a refill on your cardiac medications before your next appointment, please call your pharmacy*   Lab Work: BMET in 1 week. No appointment needed.   Go to: Medical Mall Entrance at [facility] 1st desk on the right to check in, past the screening table Lab hours: Monday- Friday (7:30 am- 5:30 pm)   If you have labs (blood work) drawn today and your tests are completely normal, you will receive your results only by: Marland Kitchen MyChart Message (if you have MyChart) OR . A paper copy in the mail If you have any lab test that is abnormal or we need to change your treatment, we will call you to review the results.   Testing/Procedures: None   Follow-Up: At [facility], you and your health needs are our priority.  As part of our continuing mission to provide you with exceptional heart care, we have created designated Provider Care Teams.  These Care Teams include your primary Cardiologist (physician) and Advanced Practice Providers (APPs -  Physician Assistants and Nurse Practitioners) who all work together to provide you with the care you need, when you need it.  Your next appointment:   1 month(s)  The format for your next appointment:   In Person  Provider:   Ida Rogue, MD or Murray Hodgkins, NP

## 2020-05-20 NOTE — Progress Notes (Signed)
Office Visit    Patient Name: Ronald Wallace Date of Encounter: 05/20/2020  Primary Care Provider:  [name], MD Primary Cardiologist:  Ida Rogue, MD  Chief Complaint    80 year old male with history of CAD, type 2 diabetes mellitus, hypertension, hyperlipidemia, neuropathy, and syncope who presents for follow-up related to dypnea on exertion status post recent stress testing.  Past Medical History    Past Medical History:  Diagnosis Date  . Anemia   . Anxiety   . Arthritis   . Bradycardia   . CAD (coronary artery disease)    a. 02/2014 Neg Ex MV, EF 65%;  b. 06/2015 Abnl MV ([facility])-->Cath ([facility]): LM nl, LAD 30p/m, LCX 30p, OM2 50p, RCA 100p w/ L-->R collats, nl EF-->Med Rx; c. 10/2017 MV: No isch/scar; d. 11/2017 Cath: LM nl, LAD 45p/m, LCX 30ost, OM2 30, RCA 100 CTO, L->R collats. PA 31/16/22, PCWP 11; e. 05/2020 MV: EF 67%, no isch/scar->low risk.  . CKD (chronic kidney disease), stage III ([place])   . Depression   . Diabetes mellitus, type II, insulin dependent ([place])    With neuropathy  . Diverticulitis   . Essential hypertension   . Hyperlipidemia   . Neuropathy   . Obesity   . Osteoporosis   . Pure hypercholesterolemia   . Shortness of breath dyspnea   . Syncope and collapse    a. 02/2014 Carotid Dopplers: 1-39% bilateral ICA with normal Subclavian & vertebral arteries; b. 02/2014 2 D Echo: EF 50-55%, Gr 1 DD, Mild MR & LA dilation -- was bradycardic in 40s; c. 04/2014 Event monitor: RSR/SA/SB. Occas PVC's. No AFib.   Past Surgical History:  Procedure Laterality Date  . CARDIAC CATHETERIZATION Left 07/07/2015   Procedure: Left Heart Cath and Coronary Angiography;  Surgeon: Isaias Cowman, MD;  Location: [facility];  Service: Cardiovascular;  Laterality: Left;  . CARPAL TUNNEL RELEASE    . CATARACT EXTRACTION Right   . CATARACT EXTRACTION W/PHACO Left 10/06/2015   Procedure: CATARACT EXTRACTION PHACO AND INTRAOCULAR LENS PLACEMENT (IOC);   Surgeon: Estill Cotta, MD;  Location: [facility] OR[facility];  Service: Ophthalmology;  Laterality: Left;  [place] 01:42 AP% 25.1 CDE 45.92 Fluid pack lot # [id number]  . COLONOSCOPY    . COLONOSCOPY WITH PROPOFOL N/A 12/04/2018   Procedure: COLONOSCOPY WITH PROPOFOL;  Surgeon: Toledo, Benay Pike, MD;  Location: [facility];  Service: Gastroenterology;  Laterality: N/A;  . NM MYOVIEW LTD  03/13/2014   LOW RISK - positive EKG but negative imaging: 2 mm ST Depression in V4 & V5 but No scintigraphic evidence of Ischemia or Infarct o  . RIGHT/LEFT HEART CATH AND CORONARY ANGIOGRAPHY N/A 11/21/2017   Procedure: RIGHT/LEFT HEART CATH AND CORONARY ANGIOGRAPHY;  Surgeon: Minna Merritts, MD;  Location: [facility];  Service: Cardiovascular;  Laterality: N/A;  . TRANSTHORACIC ECHOCARDIOGRAM  03/12/2014   EF 50-55%, Gr 1 DD, no RWMA, Mlid MR & LA dil, Normla RV and bradycardic 40s -    Allergies  Allergies  Allergen Reactions  . Amlodipine     Other reaction(s): Other (See Comments) Peripheral edema    History of Present Illness    80 year old male with above past medical history including CAD, type 2 diabetes mellitus, hypertension, hyperlipidemia, neuropathy, stage III chronic kidney disease, and syncope.  He was previously evaluated for syncope in early 2016 with an echocardiogram at that time showing normal LV function, carotid ultrasound without significant disease, and event monitoring showing no significant arrhythmias.  He underwent stress testing in 2017 in the setting of progressive dyspnea, which was followed by catheterization revealing normal LV function, chronic total occlusion of the RCA, and otherwise nonobstructive disease.  He was medically managed.  In the fall 2019, he had worsening dyspnea on exertion and underwent stress testing in October 2019 showing no significant ischemia.  Because of progressive symptoms, he underwent diagnostic catheterization in November 2019 which revealed  stable, moderate LAD and circumflex disease with a chronic total occlusion of the right coronary artery, and left to right collaterals.  Right heart pressures were relatively normal and medical therapy along with cardiac rehab enrollment were advised.  He was last seen April 12, at which time he reported worsening dyspnea on exertion.  He never had started an exercise program.  He was bradycardic with rates in the 40s at baseline though chronotropic competence was normal with ambulation.  Stress testing was undertaken and was low risk.  He continues to note dyspnea on exertion.  He has also been experiencing some lower extremity swelling and he and his daughter question if this might be playing a role in his dyspnea.  His weight is only up slightly.  He denies chest pain, palpitations, PND, orthopnea, dizziness, syncope, or early satiety.  Home Medications    Prior to Admission medications   Medication Sig Start Date End Date Taking? Authorizing Provider  acetaminophen (TYLENOL) 325 MG tablet Take 650 mg by mouth every 6 (six) hours as needed.    [provider]  aspirin EC 81 MG tablet Take 81 mg by mouth daily.    [provider]  calcium carbonate (OS-CAL) 600 MG TABS tablet Take 600 mg by mouth daily with breakfast.    [provider]  cholecalciferol (VITAMIN D) 1000 UNITS tablet Take 2,000 Units by mouth daily.     [provider]  donepezil (ARICEPT) 10 MG tablet Take 10 mg by mouth daily. 08/15/19   [provider]  escitalopram (LEXAPRO) 10 MG tablet Take 1 tablet by mouth daily. 07/23/19   [provider]  ferrous sulfate 325 (65 FE) MG tablet Take 325 mg by mouth daily with breakfast.    [provider]  gabapentin (NEURONTIN) 300 MG capsule Take 300-600 mg by mouth See admin instructions. Take 1 capsule (300 mg) by mouth in the morning & 2 capsules (600 mg) by mouth every night.    [provider]  hydrochlorothiazide  (MICROZIDE) 12.5 MG capsule Take 12.5 mg by mouth daily with breakfast.    [provider]  isosorbide mononitrate (IMDUR) 30 MG 24 hr tablet Take 1 tablet by mouth once daily 04/23/20   Minna Merritts, MD  LEVEMIR FLEXTOUCH 100 UNIT/ML FlexPen Inject 40 Units into the skin at bedtime.  07/02/19   [provider]  losartan (COZAAR) 50 MG tablet Take 50 mg by mouth daily.  06/14/17   [provider]  metFORMIN (GLUCOPHAGE) 500 MG tablet Take 250 mg by mouth 2 (two) times daily with a meal.    [provider]  NOVOLOG 100 UNIT/ML injection Inject 20-38 Units into the skin 3 (three) times daily with meals. Sliding scale insulin base rates= 20 units in the morning, 26 units with lunch, 26 units with supper. 05/06/15   [provider]  simvastatin (ZOCOR) 20 MG tablet Take 20 mg by mouth daily.    [provider]  tamsulosin (FLOMAX) 0.4 MG CAPS capsule Take 0.4 mg by mouth daily. Takes 2 each  morning.    [provider]  Vibegron (GEMTESA) 75 MG TABS Take 1 tablet by mouth daily. 07/31/19   Billey Co, MD    Review of Systems    He has continued to have dyspnea on exertion and has also noted mild lower extremity swelling.  He has had some increase in abdominal girth.  He denies chest pain, palpitations, PND, orthopnea, dizziness, syncope, or early satiety.  All other systems reviewed and are otherwise negative except as noted above.  Physical Exam    VS:  BP 116/60 (BP Location: Left Arm, Patient Position: Sitting, Cuff Size: Large)   Pulse 62   Ht 5\' 8"  (1.727 m)   Wt 201 lb (91.2 kg)   SpO2 97%   BMI 30.56 kg/m  , BMI Body mass index is 30.56 kg/m. GEN: Well nourished, well developed, in no acute distress. HEENT: normal. Neck: Supple, no JVD, carotid bruits, or masses. Cardiac: RRR, no murmurs, rubs, or gallops. No clubbing, cyanosis, trace to 1+ bilateral lower extremity edema.  Radials 2+/PT 1+ and equal bilaterally.   Respiratory:  Respirations regular and unlabored, clear to auscultation bilaterally. GI: Obese, protuberant, semifirm, BS + x 4. MS: no deformity or atrophy. Skin: warm and dry, no rash. Neuro:  Strength and sensation are intact. Psych: Normal affect.  Accessory Clinical Findings    Labs dated March 31, 2020 Hemoglobin 11.4, hematocrit 34.6, WBC 6.9, platelets 156 Sodium 144, potassium 5.1, chloride 106, BUN 42, creatinine 1.9, glucose 125 Calcium 9.5, albumin 4.1, total protein 6.6 Total bilirubin 0.4, alkaline phosphatase 56, AST 15, ALT 15 Total cholesterol 99, triglycerides 111, HDL 35.7, LDL 41  Lab dated January 05, 2020 Hemoglobin A1c 7.5  Assessment & Plan    1.  Dyspnea on exertion/heart failure with preserved ejection fraction: Patient has chronic dyspnea on exertion with previous evaluation in 2016 with echo showing normal LV function.  Catheterization in June 2017 showed a chronic total occlusion of the RCA and otherwise moderate, nonobstructive disease.  In the setting of persistent dyspnea, he underwent stress testing in April, which was nonischemic and showed normal LV function.  He has continued to have dyspnea on exertion and more recently, has noted increasing lower extremity swelling and abdominal girth.  He does have mild edema on exam today.  I suspect some degree of volume excess is playing a role in his dyspnea.  He has been taking HCTZ 12.5 mg daily.  He does have CKD 3. Most recent creatinine was 1.5 on April 21.  I am going to discontinue HCTZ and replace with Lasix 20 mg.  I have asked him to take 2 tablets x 2 days and then reduce to 1 tablet daily.  I will follow-up a basic metabolic panel next week.  2.  CAD:  S/p cath in 2019 revealing CTO of the RCA w/ L  R collaterals and mod, nonobs dzs in the L coronary tree.  Neg MV in April w/ ongoing DOE - see above.  No chest pain.  Cont asa, nitrate, and statin.  3.  Essential HTN:  Stable.  4.  Hyperlipidemia:  Remains on statin therapy with an LDL of 41 in March 2022.  5.  Type 2 diabetes mellitus: A1c 7.5 in December.  Insulin management per primary care.  6.  Stage III chronic kidney disease: Creatinine 1.5 in April.  Followed by nephrology.  Adjusting diuretics in the setting of lower extremity swelling.  Follow-up basic metabolic panel next week.  Remains on ARB therapy.  7.  Sinus bradycardia: This was noted at his last visit with a heart rate of 45 at that time in the absence of AV nodal blocking agents.  Heart rate did rise into the 80s with ambulation.  Heart rate stable today at 62.   8.  Disposition: Follow-up basic metabolic panel in 1 week.  Follow-up in clinic in 1 month or sooner if necessary.  Murray Hodgkins, NP 05/20/2020, 5:35 PM

## 2020-06-07 ENCOUNTER — Other Ambulatory Visit
Admission: RE | Admit: 2020-06-07 | Discharge: 2020-06-07 | Disposition: A | Discharge status: Home / Self Care | Payer: HMO | Attending: Nurse Practitioner | Admitting: Nurse Practitioner

## 2020-06-07 DIAGNOSIS — I10 Essential (primary) hypertension: Secondary | ICD-10-CM | POA: Insufficient documentation

## 2020-06-07 DIAGNOSIS — I2510 Atherosclerotic heart disease of native coronary artery without angina pectoris: Secondary | ICD-10-CM | POA: Insufficient documentation

## 2020-06-07 DIAGNOSIS — E785 Hyperlipidemia, unspecified: Secondary | ICD-10-CM | POA: Insufficient documentation

## 2020-06-07 DIAGNOSIS — I5032 Chronic diastolic (congestive) heart failure: Secondary | ICD-10-CM | POA: Insufficient documentation

## 2020-06-07 DIAGNOSIS — N183 Chronic kidney disease, stage 3 unspecified: Secondary | ICD-10-CM | POA: Insufficient documentation

## 2020-06-07 LAB — BASIC METABOLIC PANEL
Anion gap: 7 (ref 5–15)
BUN: 27 mg/dL — ABNORMAL HIGH (ref 8–23)
CO2: 26 mmol/L (ref 22–32)
Calcium: 9 mg/dL (ref 8.9–10.3)
Chloride: 105 mmol/L (ref 98–111)
Creatinine, Ser: 1.43 mg/dL — ABNORMAL HIGH (ref 0.61–1.24)
GFR, Estimated: 50 mL/min — ABNORMAL LOW (ref >60)
Glucose, Bld: 203 mg/dL — ABNORMAL HIGH (ref 70–99)
Potassium: 4.5 mmol/L (ref 3.5–5.1)
Sodium: 138 mmol/L (ref 135–145)

## 2020-06-08 DIAGNOSIS — L570 Actinic keratosis: Secondary | ICD-10-CM | POA: Diagnosis not present

## 2020-06-08 DIAGNOSIS — L821 Other seborrheic keratosis: Secondary | ICD-10-CM | POA: Diagnosis not present

## 2020-06-08 DIAGNOSIS — D485 Neoplasm of uncertain behavior of skin: Secondary | ICD-10-CM | POA: Diagnosis not present

## 2020-06-08 DIAGNOSIS — X32XXXA Exposure to sunlight, initial encounter: Secondary | ICD-10-CM | POA: Diagnosis not present

## 2020-06-08 DIAGNOSIS — D2262 Melanocytic nevi of left upper limb, including shoulder: Secondary | ICD-10-CM | POA: Diagnosis not present

## 2020-06-08 DIAGNOSIS — D0439 Carcinoma in situ of skin of other parts of face: Secondary | ICD-10-CM | POA: Diagnosis not present

## 2020-06-08 DIAGNOSIS — Z85828 Personal history of other malignant neoplasm of skin: Secondary | ICD-10-CM | POA: Diagnosis not present

## 2020-06-08 DIAGNOSIS — D2261 Melanocytic nevi of right upper limb, including shoulder: Secondary | ICD-10-CM | POA: Diagnosis not present

## 2020-06-08 DIAGNOSIS — D2271 Melanocytic nevi of right lower limb, including hip: Secondary | ICD-10-CM | POA: Diagnosis not present

## 2020-06-17 ENCOUNTER — Telehealth: Payer: Self-pay | Admitting: Nurse Practitioner

## 2020-06-17 NOTE — Telephone Encounter (Signed)
Spoke with patients wife per release form. Advised that he should keep upcoming appointment so that provider can assess how he is doing with both his breathing, swelling, and review his medication changes to see if they are helping. Confirmed appointment with her and recommended that they please keep this appointment with provider. She verbalized understanding, agreeable with plan, and had no further questions at this time.

## 2020-06-17 NOTE — Telephone Encounter (Signed)
Patient wife calling to see if upcoming visit is still needed .

## 2020-06-22 ENCOUNTER — Encounter: Payer: Self-pay | Admitting: Nurse Practitioner

## 2020-06-22 ENCOUNTER — Ambulatory Visit: Payer: HMO | Admitting: Nurse Practitioner

## 2020-06-22 ENCOUNTER — Other Ambulatory Visit: Payer: Self-pay

## 2020-06-22 VITALS — BP 94/52 | HR 60 | Ht 68.0 in | Wt 202.0 lb

## 2020-06-22 DIAGNOSIS — N183 Chronic kidney disease, stage 3 unspecified: Secondary | ICD-10-CM

## 2020-06-22 DIAGNOSIS — I25810 Atherosclerosis of coronary artery bypass graft(s) without angina pectoris: Secondary | ICD-10-CM | POA: Diagnosis not present

## 2020-06-22 DIAGNOSIS — I5032 Chronic diastolic (congestive) heart failure: Secondary | ICD-10-CM

## 2020-06-22 DIAGNOSIS — R0600 Dyspnea, unspecified: Secondary | ICD-10-CM | POA: Diagnosis not present

## 2020-06-22 DIAGNOSIS — R0609 Other forms of dyspnea: Secondary | ICD-10-CM

## 2020-06-22 DIAGNOSIS — I10 Essential (primary) hypertension: Secondary | ICD-10-CM

## 2020-06-22 DIAGNOSIS — E785 Hyperlipidemia, unspecified: Secondary | ICD-10-CM

## 2020-06-22 MED ORDER — FUROSEMIDE 20 MG PO TABS: 20.0000 mg | ORAL_TABLET | Freq: Every day | ORAL | 3 refills | Status: DC

## 2020-06-22 NOTE — Patient Instructions (Signed)
Medication Instructions:  Refill has been sent in. No changes at this time.  *If you need a refill on your cardiac medications before your next appointment, please call your pharmacy*   Lab Work: None  If you have labs (blood work) drawn today and your tests are completely normal, you will receive your results only by: Marland Kitchen MyChart Message (if you have MyChart) OR . A paper copy in the mail If you have any lab test that is abnormal or we need to change your treatment, we will call you to review the results.   Testing/Procedures: None   Follow-Up: At [facility], you and your health needs are our priority.  As part of our continuing mission to provide you with exceptional heart care, we have created designated Provider Care Teams.  These Care Teams include your primary Cardiologist (physician) and Advanced Practice Providers (APPs -  Physician Assistants and Nurse Practitioners) who all work together to provide you with the care you need, when you need it.   Your next appointment:   4 month(s)  The format for your next appointment:   In Person  Provider:   Ida Rogue, MD

## 2020-06-22 NOTE — Progress Notes (Signed)
Office Visit    Patient Name: Ronald Wallace Date of Encounter: 06/22/2020  Primary Care Provider:  [name], MD Primary Cardiologist:  Ida Rogue, MD  Chief Complaint    80 year old male with a history of CAD, type 2 diabetes mellitus, hypertension, hyperlipidemia, neuropathy, and syncope, who presents for follow-up related to dyspnea on exertion.  Past Medical History    Past Medical History:  Diagnosis Date  . Anemia   . Anxiety   . Arthritis   . Bradycardia   . CAD (coronary artery disease)    a. 02/2014 Neg Ex MV, EF 65%;  b. 06/2015 Abnl MV ([facility])-->Cath ([facility]): LM nl, LAD 30p/m, LCX 30p, OM2 50p, RCA 100p w/ L-->R collats, nl EF-->Med Rx; c. 10/2017 MV: No isch/scar; d. 11/2017 Cath: LM nl, LAD 45p/m, LCX 30ost, OM2 30, RCA 100 CTO, L->R collats. PA 31/16/22, PCWP 11; e. 05/2020 MV: EF 67%, no isch/scar->low risk.  . CKD (chronic kidney disease), stage III ([place])   . Depression   . Diabetes mellitus, type II, insulin dependent ([place])    With neuropathy  . Diverticulitis   . Essential hypertension   . Hyperlipidemia   . Neuropathy   . Obesity   . Osteoporosis   . Pure hypercholesterolemia   . Shortness of breath dyspnea   . Syncope and collapse    a. 02/2014 Carotid Dopplers: 1-39% bilateral ICA with normal Subclavian & vertebral arteries; b. 02/2014 2 D Echo: EF 50-55%, Gr 1 DD, Mild MR & LA dilation -- was bradycardic in 40s; c. 04/2014 Event monitor: RSR/SA/SB. Occas PVC's. No AFib.   Past Surgical History:  Procedure Laterality Date  . CARDIAC CATHETERIZATION Left 07/07/2015   Procedure: Left Heart Cath and Coronary Angiography;  Surgeon: Isaias Cowman, MD;  Location: [place] [facility];  Service: Cardiovascular;  Laterality: Left;  . CARPAL TUNNEL RELEASE    . CATARACT EXTRACTION Right   . CATARACT EXTRACTION W/PHACO Left 10/06/2015   Procedure: CATARACT EXTRACTION PHACO AND INTRAOCULAR LENS PLACEMENT (IOC);  Surgeon: Estill Cotta,  MD;  Location: [facility];  Service: Ophthalmology;  Laterality: Left;  [place] 01:42 AP% 25.1 CDE 45.92 Fluid pack lot # [id number]  . COLONOSCOPY    . COLONOSCOPY WITH PROPOFOL N/A 12/04/2018   Procedure: COLONOSCOPY WITH PROPOFOL;  Surgeon: Toledo, Benay Pike, MD;  Location: [facility];  Service: Gastroenterology;  Laterality: N/A;  . NM MYOVIEW LTD  03/13/2014   LOW RISK - positive EKG but negative imaging: 2 mm ST Depression in V4 & V5 but No scintigraphic evidence of Ischemia or Infarct o  . RIGHT/LEFT HEART CATH AND CORONARY ANGIOGRAPHY N/A 11/21/2017   Procedure: RIGHT/LEFT HEART CATH AND CORONARY ANGIOGRAPHY;  Surgeon: Minna Merritts, MD;  Location: [place] [facility];  Service: Cardiovascular;  Laterality: N/A;  . TRANSTHORACIC ECHOCARDIOGRAM  03/12/2014   EF 50-55%, Gr 1 DD, no RWMA, Mlid MR & LA dil, Normla RV and bradycardic 40s -    Allergies  Allergies  Allergen Reactions  . Amlodipine     Other reaction(s): Other (See Comments) Peripheral edema    History of Present Illness    80 year old male with above past medical history including CAD, type 2 diabetes mellitus, hypertension, hyperlipidemia, neuropathy, stage III chronic kidney disease, and syncope.  He was previously evaluated for syncope in early 2016 with an echocardiogram that showed normal LV function, carotid ultrasound that showed no significant disease, and event monitoring showing no significant arrhythmias.  He underwent stress  testing in 2017 in the setting of progressive dyspnea, which was followed by catheterization revealing normal LV function, chronic total occlusion of the RCA, and otherwise nonobstructive disease.  Medical therapy was recommended.  In the fall 2019, he had worsening dyspnea and underwent stress testing October 2019 showing no significant ischemia.  Repeat diagnostic catheterization in the setting of progressive symptoms in November 2019 showed stable, moderate LAD and circumflex disease with  a chronic total occlusion of the right coronary artery, and left to right collaterals.  Right heart pressures were relatively normal and again, medical therapy was recommended along with cardiac rehab enrollment.  In April 2022, he reported worsening dyspnea on exertion.  He had not started an exercise program.  He was bradycardic with rates in the 40s at baseline though chronotropic competence was normal with ambulation.  Stress testing was again undertaken and was low risk.  His most recent follow-up on May 5, he reported ongoing dyspnea and mild lower extremity edema.  His weight was up slightly.  I discontinued HCTZ and replaced with Lasix 20 mg daily.  Creatinine was stable at 1.43 on May 23.  He has not really noticed any change in his degree of dyspnea after switching diuretics.  He continues to have trace ankle edema.  He prefers not to take a higher dose of diuretic therapy.  In discussing his dyspnea today, he describes being able to be fairly active out in his yard, weed eating and doing other things around the yard without any symptoms or limitations.  But after he comes in the house, he will sometimes feel the need to take deeper, more satisfying breaths and complains of dyspnea to his wife.  We discussed his history of testing at length today, including recent low risk stress test.  I did offer pulmonology referral however he deferred for the time being.  He denies chest pain, palpitations, PND, orthopnea, dizziness, syncope, or early satiety.  Home Medications    Prior to Admission medications   Medication Sig Start Date End Date Taking? Authorizing Provider  acetaminophen (TYLENOL) 325 MG tablet Take 650 mg by mouth every 6 (six) hours as needed.    [provider]  aspirin EC 81 MG tablet Take 81 mg by mouth daily.    [provider]  calcium carbonate (OS-CAL) 600 MG TABS tablet Take 600 mg by mouth daily with breakfast.    [provider]  cholecalciferol  (VITAMIN D) 1000 UNITS tablet Take 2,000 Units by mouth daily.     [provider]  donepezil (ARICEPT) 10 MG tablet Take 10 mg by mouth daily. 08/15/19   [provider]  escitalopram (LEXAPRO) 10 MG tablet Take 1 tablet by mouth daily. 07/23/19   [provider]  ferrous sulfate 325 (65 FE) MG tablet Take 325 mg by mouth daily with breakfast.    [provider]  furosemide (LASIX) 20 MG tablet Take 2 tablets (40 mg total) by mouth daily for 2 days, THEN 1 tablet (20 mg total) daily for 28 days. 05/20/20 06/19/20  Theora Gianotti, NP  gabapentin (NEURONTIN) 300 MG capsule Take 300-600 mg by mouth See admin instructions. Take 1 capsule (300 mg) by mouth in the morning & 2 capsules (600 mg) by mouth every night.    [provider]  isosorbide mononitrate (IMDUR) 30 MG 24 hr tablet Take 1 tablet by mouth once daily 04/23/20   Minna Merritts, MD  LEVEMIR FLEXTOUCH 100 UNIT/ML FlexPen Inject 40  Units into the skin at bedtime.  07/02/19   [provider]  losartan (COZAAR) 50 MG tablet Take 50 mg by mouth daily.  06/14/17   [provider]  metFORMIN (GLUCOPHAGE) 500 MG tablet Take 250 mg by mouth 2 (two) times daily with a meal.    [provider]  NOVOLOG 100 UNIT/ML injection Inject 20-38 Units into the skin 3 (three) times daily with meals. Sliding scale insulin base rates= 20 units in the morning, 26 units with lunch, 26 units with supper. 05/06/15   [provider]  simvastatin (ZOCOR) 20 MG tablet Take 20 mg by mouth daily.    [provider]  tamsulosin (FLOMAX) 0.4 MG CAPS capsule Take 0.4 mg by mouth daily. Takes 2 each morning.    [provider]  Vibegron (GEMTESA) 75 MG TABS Take 1 tablet by mouth daily. 07/31/19   Billey Co, MD    Review of Systems    Ongoing dyspnea, typically after activity and associated with feeling the need to take deeper, more satisfying breaths.  He denies chest  pain, palpitations, PND, orthopnea, dizziness, syncope, edema, or early satiety..  All other systems reviewed and are otherwise negative except as noted above.  Physical Exam    VS:  BP (!) 94/52 (BP Location: Left Arm, Patient Position: Sitting, Cuff Size: Large)   Pulse 60   Ht 5\' 8"  (1.727 m)   Wt 202 lb (91.6 kg)   SpO2 96%   BMI 30.71 kg/m  , BMI Body mass index is 30.71 kg/m. GEN: Well nourished, well developed, in no acute distress. HEENT: normal. Neck: Supple, no JVD, carotid bruits, or masses. Cardiac: RRR, no murmurs, rubs, or gallops. No clubbing, cyanosis, trace bilateral ankle edema.  Radials 2+/PT 1+ and equal bilaterally.  Respiratory:  Respirations regular and unlabored, clear to auscultation bilaterally. GI: Soft, nontender, nondistended, BS + x 4. MS: no deformity or atrophy. Skin: warm and dry, no rash. Neuro:  Strength and sensation are intact. Psych: Normal affect.  Accessory Clinical Findings    Recent stress test discussed in detail with patient and wife today.  Past medical history (above) updated with this information.  Lab Results  Component Value Date   WBC 7.4 06/18/2019   HGB 10.9 (L) 06/18/2019   HCT 32.0 (L) 06/18/2019   MCV 89.5 06/18/2019   PLT 151.0 06/18/2019   Lab Results  Component Value Date   CREATININE 1.43 (H) 06/07/2020   BUN 27 (H) 06/07/2020   NA 138 06/07/2020   K 4.5 06/07/2020   CL 105 06/07/2020   CO2 26 06/07/2020   Lab Results  Component Value Date   ALT 17 06/18/2019   AST 16 06/18/2019   ALKPHOS 53 06/18/2019   BILITOT 0.4 06/18/2019    Assessment & Plan    1.  Dyspnea on exertion/HFpEF: As previously noted, patient with chronic dyspnea on exertion with multiple evaluations including echocardiography, stress testing, and diagnostic catheterizations (November 2019-chronic total occlusion of the right coronary artery with otherwise moderate, nonobstructive disease).  Most recently, he underwent stress testing in  May of this year, which was low risk.  At his last visit, I switched him from HCTZ to Lasix however, he does not feel that this has made a significant difference in his symptoms.  His weight has been stable.  He has trace lower extremity swelling.  He describes being fairly active and feeling well in general but it is sometimes after activities that he  feels the need to take deeper, more satisfying breaths.  He does not experience chest pain.  Given his history of chronic kidney disease, I prefer not to titrate Lasix any further and have instead encouraged him to be careful with his salt intake.  With regards to mild lower extremity swelling, I have encouraged him to keep his legs elevated or use compression socks.  Finally, I offered pulmonology referral.  His wife will look into provider options and contact us if they would like to go through with referral locally.  2.  Coronary artery disease: Status post catheterization in 2019 revealing a chronic total occlusion of the right coronary artery with left-to-right collaterals and otherwise moderate, nonobstructive disease in the left coronary tree.  Negative Myoview in early May.  No chest pain.  Continue aspirin, nitrate, and statin.  3.  Essential hypertension: Blood pressure somewhat soft today.  He is asymptomatic.  Continue current regimen.  4.  Hyperlipidemia: Continue statin therapy.  LDL of 41 in March of this year.  5.  Type 2 diabetes mellitus: A1c was 7.5 in December.  Insulin management per primary care.  6.  Stage III chronic kidney disease: Creatinine stable at 1.43 on Lasix therapy in May.  Continue ARB therapy.  7.  Disposition: Follow-up in 4 months or sooner if necessary.  Patient or wife will contact us if they would like a pulmonology referral.  Murray Hodgkins, NP 06/22/2020, 5:44 PM

## 2020-07-12 ENCOUNTER — Other Ambulatory Visit: Payer: Self-pay | Admitting: Cardiovascular Disease

## 2020-07-14 DIAGNOSIS — D0439 Carcinoma in situ of skin of other parts of face: Secondary | ICD-10-CM | POA: Diagnosis not present

## 2020-08-04 ENCOUNTER — Ambulatory Visit: Payer: HMO

## 2020-08-04 ENCOUNTER — Ambulatory Visit (INDEPENDENT_AMBULATORY_CARE_PROVIDER_SITE_OTHER): Payer: HMO | Admitting: Psychology

## 2020-08-04 ENCOUNTER — Encounter: Payer: Self-pay | Admitting: Psychology

## 2020-08-04 ENCOUNTER — Other Ambulatory Visit: Payer: Self-pay

## 2020-08-04 DIAGNOSIS — E1140 Type 2 diabetes mellitus with diabetic neuropathy, unspecified: Secondary | ICD-10-CM | POA: Insufficient documentation

## 2020-08-04 DIAGNOSIS — F329 Major depressive disorder, single episode, unspecified: Secondary | ICD-10-CM | POA: Insufficient documentation

## 2020-08-04 DIAGNOSIS — F411 Generalized anxiety disorder: Secondary | ICD-10-CM

## 2020-08-04 DIAGNOSIS — K219 Gastro-esophageal reflux disease without esophagitis: Secondary | ICD-10-CM | POA: Insufficient documentation

## 2020-08-04 DIAGNOSIS — F0280 Dementia in other diseases classified elsewhere without behavioral disturbance: Secondary | ICD-10-CM

## 2020-08-04 DIAGNOSIS — R4189 Other symptoms and signs involving cognitive functions and awareness: Secondary | ICD-10-CM

## 2020-08-04 DIAGNOSIS — F330 Major depressive disorder, recurrent, mild: Secondary | ICD-10-CM

## 2020-08-04 DIAGNOSIS — M899 Disorder of bone, unspecified: Secondary | ICD-10-CM | POA: Insufficient documentation

## 2020-08-04 DIAGNOSIS — M949 Disorder of cartilage, unspecified: Secondary | ICD-10-CM | POA: Insufficient documentation

## 2020-08-04 HISTORY — DX: Dementia in other diseases classified elsewhere, unspecified severity, without behavioral disturbance, psychotic disturbance, mood disturbance, and anxiety: F02.80

## 2020-08-04 NOTE — Progress Notes (Signed)
NEUROPSYCHOLOGICAL EVALUATION [place] [place] Neurology  Date of Evaluation: August 04, 2020  Reason for Referral:   RICARD FAULKNER is a 80 y.o. right-handed Caucasian male referred by Ellouise Newer, M.D., to characterize his current cognitive functioning and assist with diagnostic clarity and treatment planning in the context of subjective cognitive decline and prior concerns surrounding mild cognitive impairment of unclear etiology.   Assessment and Plan:   Clinical Impression(s): Mr. Lenis' pattern of performance is suggestive of somewhat diffuse cognitive impairment with perhaps most prominent deficits exhibited across processing speed, attention/concentration, executive functioning, receptive language, and semantic fluency. Verbal memory was somewhat variable, but also below expectation as a whole. Variability was exhibited across visuospatial abilities, while performance was appropriate relative to educational estimations across phonemic fluency, confrontation naming, and visual memory. Regarding activities of daily living (ADLs), Mr. Brumett' wife reported that she plays a large role in medication management and commonly oversees her husband's taking of pills. Both she and her daughter noted that Mr. Hemphill has gotten lost while driving several times to the extent that a GPS tracking device was placed in his car. Overall, given evidence for cognitive impairment, coupled with report of worsening of functional status, I do believe that Mr. Vasco has transitioned to a Major Neurocognitive Disorder ("dementia") designation. He is likely towards the milder stages of this diagnostic spectrum currently.   Relative to his previous evaluation, there was a good degree of stability. Performance declines were most notable across semantic fluency and on a list learning task. Mild improvements were seen across cognitive flexibility (although scores were still well  below average) and his recall of a previously drawn figure. Given that his affect was much improved during the current evaluation relative to previous testing, this may account for mild improvements. Mr. Morgenthaler did not appear to benefit from practice effects as he was given the same versions of tests previously taken, which could further suggest trouble with memory.   The etiology of dysfunction remains somewhat unclear but is likely mixed. Neuroimaging suggested mild to moderate small vessel ischemic changes and his medical history is positive for several conditions with effect the vasculature of the body and brain (i.e., type II diabetes, coronary artery disease, hyperlipidemia, hypertension, chronic kidney disease). As such, it seems safe to assume a vascular contribution to his current presentation. Deficits with processing speed, attention/concentration, and executive functioning are consistent with this etiology. However, neuroimaging also revealed notable volume loss (2nd percentile relative to age-matched peers), with particular emphasis on the hippocampus (<1st percentile volume relative to age-matched peers). Both of these findings are advanced relative to a pure vascular etiology and suggest a co-occurring neurodegenerative condition. Of these conditions, Alzheimer's disease would appear most likely. He exhibited deficits in verbal memory, including being amnesic across a list learning task with poor recognition discriminability. This, coupled with a notable discrepancy in semantic versus phonemic fluency is consistent with this condition. Admittedly, he does still exhibit some retention across memory measures, suggesting that his memory storage deficit is not complete at the present time. However, this would not preclude this disease from being present, perhaps in earlier stages. Behavioral and cognitive characteristics do not strongly align with Lewy body dementia or frontotemporal dementia at the  present time. Continued medical monitoring will be important moving forward.   Recommendations: Mr. Leach has already been prescribed medication to assist with memory loss and the slowing of a neurodegenerative process such as Alzheimer's disease (i.e., donepezil). He  is strongly encouraged to continue taking this medication as prescribed. Likewise, he is strongly encouraged to ensure that other medical conditions, especially those vascular in nature, continue to be treated appropriately and medication doses are not missed.   Should there be further progression of current deficits over time, Mr. Mahon is unlikely to regain any independent living skills lost. Therefore, it is recommended that he remain as involved as possible in all aspects of household chores, finances, and medication management, with supervision to ensure adequate performance. He will likely benefit from the establishment and maintenance of a routine in order to maximize his functional abilities over time.  It will be important for Mr. Caruth to have another person with him when in situations where he may need to process information, weigh the pros and cons of different options, and make decisions, in order to ensure that he fully understands and recalls all information to be considered.  If not already done, Mr. Goheen and his family may want to discuss his wishes regarding durable power of attorney and medical decision making, so that he can have input into these choices. Additionally, they may wish to discuss future plans for caretaking and seek out community options for in home/residential care should they become necessary. This is especially important as his wife and daughter reported that his wife has had some medical scares in the past year which have further opened their eyes to how dependant he is on her well being and functioning. It will be important to plan for all eventualities.   Performance across neurocognitive testing  is not a strong predictor of an individual's safety operating a motor vehicle. Should his family wish to pursue a formalized driving evaluation, they would be encouraged to contact [employer] in [place], [place] at [phone]. Another option would be through [facility]; however, the latter would likely require a referral from a medical doctor. [facility] can be reached directly at [phone].   Mr. Rog is encouraged to attend to lifestyle factors for brain health (e.g., regular physical exercise, good nutrition habits, regular participation in cognitively-stimulating activities, and general stress management techniques), which are likely to have benefits for both emotional adjustment and cognition. Optimal control of vascular risk factors (including safe cardiovascular exercise and adherence to dietary recommendations) is encouraged. Continued participation in activities which provide mental stimulation and social interaction is also recommended.   When learning new information, he would benefit from information being broken up into small, manageable pieces. He may also find it helpful to articulate the material in his own words and in a context to promote encoding at the onset of a new task. This material may need to be repeated multiple times to promote encoding.  Because he shows better recall for structured information, he may understand and retain new information better if it is presented to him in a meaningful or well-organized manner at the outset, such as grouping items into meaningful categories or presenting information in an outlined, bulleted, or story format.   To address problems with processing speed, he may wish to consider:   -Ensuring that he is alerted when essential material or instructions are being presented   -Adjusting the speed at which new information is presented   -Allowing for more time in comprehending, processing, and responding in  conversation  To address problems with fluctuating attention, he may wish to consider:   -Avoiding external distractions when needing to concentrate   -Limiting exposure to fast paced environments  with multiple sensory demands   -Writing down complicated information and using checklists   -Attempting and completing one task at a time (i.e., no multi-tasking)   -Verbalizing aloud each step of a task to maintain focus   -Reducing the amount of information considered at one time  Review of Records:   Mr. Packett was initially seen by [facility] Marland KitchenEllouise Newer, M.D.) on 06/18/2019 for an evaluation of memory loss. At that time, his family reported feeling that memory changes started only one month prior. Mr. Bartow himself became tearful stating "I cannot remember things," making him frustrated. He stated "some things I can remember, but others I do not." Additional examples included him forgetting a conversation from five minutes prior or having trouble remembering names of certain individuals. He lives with his wife who has to double check behind him on his insulin and other medications, often telling him what dosage to take. He writes down bills and his wife pays them. He has gotten lost while driving a few times to the extent that his family now has a GPS tracker and/or another person is always in the car with him. There was also some concerns surrounding depressed mood, especially since the COVID-19 pandemic started. His family noticed that he would not want to be around his grandchildren anymore and that he would get very easily distracted. People would say something and it "sends him off to another pedestal." Paxil had been recently changed to Lexapro and he was started on Donepezil $RemoveBefo'5mg'[id number]$  daily. He denied any significant head injuries. He did have a syncopal episode in 2018 where he woke up on the ground at work. He used to have frequent headaches while working. While they at one point quieted down,  symptoms recently came back "like they used to be." There is no associated nausea/vomiting, photo/phonophobia. His family thinks symptoms are stress-related. He denied dizziness, diplopia, dysarthria/dysphagia, bowel/bladder dysfunction, or anosmia. He did report chronic neck and back pain, as well as some tingling and numbness in his hands and feet. He has occasional hand tremors. Sleep is pretty good. They denied any REM sleep behaviors but he has fallen out of bed while sleeping a few times. Performance on a brief cognitive screening instrument (SLUMS) was 18/30. Ultimately, Mr. Pieper was referred for a comprehensive neuropsychological evaluation to characterize his cognitive abilities and to assist with diagnostic clarity and treatment planning.   Mr. Diskin completed a comprehensive neuropsychological evaluation Alphonzo Severance, Psy.D.) on 07/23/2019. Results revealed low scores in most assessed cognitive domains, including measures of attention and working memory, processing speed, visuospatial/constructional function, and on select language measures including visual object confrontation naming and phonemic verbal fluency. Interestingly, he did not show clear memory storage problems and instead had notable encoding issues and relatively preserved retention of information across time on several tasks. Further obfuscating things, his naming problems reflected misperception errors as much as they did semantic paraphasia or true naming deficits. As such, the findings were viewed as nonspecific. He did screen positive for the presence of depression, which was said to be consistent with his presentation. On severity status rating, his wife characterized him as functioning at a mild dementia level. Overall, he was diagnosed with mild cognitive impairment due to unclear etiology, as well as anxiety and depression.  Mr. Buckner most recently met with Dr. Delice Lesch on 01/14/2020 for continued follow-up. Since his last  visit, he reported feeling as though his memory is the same. His wife and daughter agreed with this assessment.  He drives minimally with no issues. His daughter reportedly rode with him and felt he did fine. He manages his own medications and does well. He keeps a record of their bills which his wife then pays. He reported his mood as good until he gets upset. His family noted he will get upset and agitated easily. He is on Lexapro 71m daily and Donepezil 110mdaily without side effects. No paranoia or hallucinations were reported. He denied any change in headaches; he "has had them all my life on a daily occurrence," but not bad lately. He was in the hospital 2-3 months ago for diverticulosis/abdominal pain. He did not remember this initially; his daughter reminded him. Ultimately, Mr. JaChizekas referred for a repeat evaluation to characterize his cognitive abilities and to assist with diagnostic clarity and treatment planning.  Brain MRI on 11/18/2016 revealed mild to moderate bilateral small vessel ischemic disease. Brain MRI on 07/05/2019 again revealed mild to moderate chronic small vessel ischemic disease. Volumetric analysis revealed whole brain volume to be at the 2nd percentile relative to age-matched peers, with hippocampal volume at <1st percentile.   Past Medical History:  Diagnosis Date   Anemia of chronic disease 07/01/2015   Arthritis    Benign prostatic hyperplasia with nocturia 10/30/2016   Bradycardia 03/12/2014   Bruit of left carotid artery 03/12/2014   CKD (chronic kidney disease) 09/27/2015   stage 3, GFR 30-59 ml/min   Coronary artery disease involving native coronary artery of native heart without angina pectoris    a. 02/2014 Neg Ex MV, EF 65%;  b. 06/2015 Abnl MV ([facility])-->Cath ([facility]): LM nl, LAD 30p/m, LCX 30p, OM2 50p, RCA 100p w/ L-->R collats, nl EF-->Med Rx; c. 10/2017 MV: No isch/scar; d. 11/2017 Cath: LM nl, LAD 45p/m, LCX 30ost, OM2 30, RCA 100 CTO, L->R collats.  PA 31/16/22, PCWP 11; e. 05/2020 MV: EF 67%, no isch/scar->low risk.   Diabetes mellitus, type II, insulin dependent    Diabetic neuropathy    Diabetic retinopathy associated with type 2 diabetes mellitus 10/28/2018   Disorder of bone and cartilage    Diverticulitis    DOE (dyspnea on exertion) 03/12/2014   Generalized anxiety disorder    GERD (gastroesophageal reflux disease)    Hypertension associated with diabetes    Major depressive disorder    MCI (mild cognitive impairment) 07/2019   Mixed hyperlipidemia due to type 2 diabetes mellitus 02/06/2017   Obesity    Osteoporosis    Pure hypercholesterolemia    Syncope and collapse    a. 02/2014 Carotid Dopplers: 1-39% bilateral ICA with normal Subclavian & vertebral arteries; b. 02/2014 2 D Echo: EF 50-55%, Gr 1 DD, Mild MR & LA dilation -- was bradycardic in 40s; c. 04/2014 Event monitor: RSR/SA/SB. Occas PVC's. No AFib.    Past Surgical History:  Procedure Laterality Date   CARDIAC CATHETERIZATION Left 07/07/2015   Procedure: Left Heart Cath and Coronary Angiography;  Surgeon: AlIsaias CowmanMD;  [facility];  Service: Cardiovascular;  Laterality: Left;   CARPAL TUNNEL RELEASE     CATARACT EXTRACTION Right    CATARACT EXTRACTION W/PHACO Left 10/06/2015   Procedure: CATARACT EXTRACTION PHACO AND INTRAOCULAR LENS PLACEMENT ([place]  Surgeon: StEstill CottaMD;  Location: [facility];  Service: Ophthalmology;  Laterality: Left;  [place]1:42 AP% 25.1 CDE 45.92 Fluid pack lot # [id number]   COLONOSCOPY     COLONOSCOPY WITH PROPOFOL N/A 12/04/2018   Procedure: COLONOSCOPY WITH PROPOFOL;  Surgeon: [place]inn Valley[place]  MD;  Location: [facility];  Service: Gastroenterology;  Laterality: N/A;   NM MYOVIEW LTD  03/13/2014   LOW RISK - positive EKG but negative imaging: 2 mm ST Depression in V4 & V5 but No scintigraphic evidence of Ischemia or Infarct o   RIGHT/LEFT HEART CATH AND CORONARY ANGIOGRAPHY N/A 11/21/2017   Procedure:  RIGHT/LEFT HEART CATH AND CORONARY ANGIOGRAPHY;  Surgeon: Minna Merritts, MD;  Location: [facility];  Service: Cardiovascular;  Laterality: N/A;   TRANSTHORACIC ECHOCARDIOGRAM  03/12/2014   EF 50-55%, Gr 1 DD, no RWMA, Mlid MR & LA dil, Normla RV and bradycardic 40s -    Current Outpatient Medications:    acetaminophen (TYLENOL) 325 MG tablet, Take 650 mg by mouth every 6 (six) hours as needed., Disp: , Rfl:    aspirin EC 81 MG tablet, Take 81 mg by mouth daily., Disp: , Rfl:    calcium carbonate (OS-CAL) 600 MG TABS tablet, Take 600 mg by mouth daily with breakfast., Disp: , Rfl:    cholecalciferol (VITAMIN D) 1000 UNITS tablet, Take 2,000 Units by mouth daily. , Disp: , Rfl:    donepezil (ARICEPT) 10 MG tablet, Take 10 mg by mouth daily., Disp: , Rfl:    escitalopram (LEXAPRO) 10 MG tablet, Take 1 tablet by mouth daily., Disp: , Rfl:    ferrous sulfate 325 (65 FE) MG tablet, Take 325 mg by mouth daily with breakfast., Disp: , Rfl:    furosemide (LASIX) 20 MG tablet, Take 1 tablet (20 mg total) by mouth daily., Disp: 90 tablet, Rfl: 3   gabapentin (NEURONTIN) 300 MG capsule, Take 300-600 mg by mouth See admin instructions. Take 1 capsule (300 mg) by mouth in the morning & 2 capsules (600 mg) by mouth every night., Disp: , Rfl:    isosorbide mononitrate (IMDUR) 30 MG 24 hr tablet, Take 1 tablet by mouth once daily, Disp: 90 tablet, Rfl: 0   LEVEMIR FLEXTOUCH 100 UNIT/ML FlexPen, Inject 40 Units into the skin at bedtime. , Disp: , Rfl:    levothyroxine (SYNTHROID) 50 MCG tablet, Take 50 mcg by mouth daily before breakfast., Disp: , Rfl:    losartan (COZAAR) 50 MG tablet, Take 50 mg by mouth daily. , Disp: , Rfl:    metFORMIN (GLUCOPHAGE) 500 MG tablet, Take 250 mg by mouth 2 (two) times daily with a meal., Disp: , Rfl:    NOVOLOG 100 UNIT/ML injection, Inject 20-38 Units into the skin 3 (three) times daily with meals. Sliding scale insulin base rates= 20 units in the morning, 26 units  with lunch, 26 units with supper., Disp: , Rfl:    simvastatin (ZOCOR) 20 MG tablet, Take 20 mg by mouth daily., Disp: , Rfl:    tamsulosin (FLOMAX) 0.4 MG CAPS capsule, Take 0.4 mg by mouth daily. Takes 2 each morning., Disp: , Rfl:    Vibegron (GEMTESA) 75 MG TABS, Take 1 tablet by mouth daily., Disp: 30 tablet, Rfl: 11  Clinical Interview:   The following information was obtained during a clinical interview with Mr. Vandervelden, his wife, and daughter Larene Beach prior to cognitive testing.  Cognitive Symptoms: Decreased short-term memory: Endorsed. Mr. Heideman reported largely generalized difficulties, stating that he "forgets things." When asked directly, he reported trouble remembering names and frequently misplacing/losing things. He also acknowledged increased difficulties with directions and more frequently getting lost while driving. He noted memory concerns being present for the past six months. This is worth highlighting as he completed a prior neuropsychological  evaluation 12 months ago where he also reported memory concerns. He acknowledged that difficulties do seem worse during the past year. His wife and daughter also emphasized progressive memory decline since his previous evaluation.  Decreased long-term memory: Denied. Decreased attention/concentration: Endorsed. While he denied trouble with sustained attention, he did acknowledge difficulty with increased distractibility and losing his train of thought. Reduced processing speed: Endorsed. Difficulties with executive functions: Endorsed. Difficulties with organization were related back to trouble misplacing/losing things. He reported keeping notebooks of important information but then forgets information that is in each notebook or where these are located. He stated he does not make decisions these days. His wife does this for him. He denied trouble with impulsivity. His wife stated that he will sometimes act impulsively when stressed or  frustrated.  Difficulties with emotion regulation: Variably so. His wife and daughter both noted that Mr. Guay has a short fuse and seems far more irritable and easily upset. A more mercurial personality was said to be longstanding in nature.  Difficulties with receptive language: Denied. Difficulties with word finding: Denied. Decreased visuoperceptual ability: Denied.  Difficulties completing ADLs: Endorsed. His wife reported that he has exhibited significant difficulties managing medications in the past to the extent that she had to step in and ensure that these were being taken regularly and correctly. Lately, she stated that he was doing an adequate job at managing these medications. However, she continues to oversee medication taking on a daily basis and will have to correct errors. His wife manages finances and bill paying which is longstanding in nature. He continues to drive to close, familiar locations. His wife explicitly stated that she wishes he would not drive. They have placed a GPS tracking device on his car due to several instances of him getting turned around and/or lost. His wife and daughter have been wary of taking away his independence. They noted that he has told them that he would never leave his home while still alive. At the same time, due to his wife's recent health challenges, his wife and daughter noted that he has been "shocked" into how dependant on her he is.   Additional Medical History: History of traumatic brain injury/concussion: Denied. History of stroke: Denied. History of seizure activity: Denied. History of known exposure to toxins: Denied. Symptoms of chronic pain: Endorsed. He reported chronic back and neck pain.  Experience of frequent headaches/migraines: Endorsed. Headache symptoms were said to be longstanding in nature, largely present throughout his entire life. He joked that he had a headache when born to highlight this. He takes over-the-counter  medications such as Tylenol to treat symptoms. When these are more severe, he utilizes Tramadol as needed.  Frequent instances of dizziness/vertigo: Endorsed. Symptoms were said to be pronounced when bending over and then standing back up.   Sensory changes: He wears glasses with some benefit. Unfortunately, he stated that he recently received new glasses and has been unable to get used to them thus far. He also reported losing his sense of smell an unknown time ago. Other sensory changes/difficulties (e.g., hearing or taste) were denied.  Balance/coordination difficulties: Endorsed. He described his balance as "terrible." This was at least partially attributed to ongoing dizziness. Other causes for balance instability were unknown. He did not report any recent falls. His wife later noted a prior fall while walking to the mailbox. However, she did not specify how long ago this occurred. He did not sustain any significant injuries.  Other motor difficulties: Endorsed. He  reported seemingly sporadic, mild tremors in his right hand. These are generally present when using his hand (e.g., eating cereal in the morning). He was unsure how long these had been present but felt that they had been stable over time.  Sleep History: Estimated hours obtained each night: 6-8 hours.  Difficulties falling asleep: Denied. Difficulties staying asleep: Denied. Feels rested and refreshed upon awakening: Endorsed.  History of snoring: Denied. History of waking up gasping for air: Denied. Witnessed breath cessation while asleep: Denied.  History of vivid dreaming: Denied. Excessive movement while asleep: Endorsed. He and his wife reported that he has fallen out of bed on several occasions (about 3-4 times over the past 1-2 years). Mr. Goostree noted that he commonly sleeps on the very edge of the bed and that he generally has not been injured by any of these falls. A fall happened once in the past three months. Outside of  him occasionally speaking/yelling in his sleep, no other REM behaviors were noted.  Instances of acting out his dreams: Denied.  Psychiatric/Behavioral Health History: Depression: He described his current mood as "pretty good" but did acknowledge that it seems to "come and go." His wife added that "some days you can live with him while other days are harder." This pattern was said to be related to his overall personality and is longstanding in nature. He acknowledged periods of time where he will experience mild depressive symptoms. He stated that much of this relates back to loss in functioning and independence. Current or remote suicidal ideation, intent, or plan was denied.  Anxiety: Endorsed. Generalized symptoms of anxiety were said to be longstanding in nature.  Mania: Denied. Trauma History: Denied. Visual/auditory hallucinations: Denied. Delusional thoughts: Denied.  Tobacco: Denied. Alcohol: He denied current alcohol consumption as well as a history of problematic alcohol abuse or dependence.  Recreational drugs: Denied. Caffeine: Denied.   Family History: Problem Relation Age of Onset   Heart attack Mother    Breast cancer Mother    Lung cancer Father    This information was confirmed by Mr. Seabrooks.  Academic/Vocational History: Highest level of educational attainment: 14 years. He graduated from high school and later earned an Associate's degree. He described himself as an average (C) student in academic settings. Lavena Stanford was noted as a likely relative weakness.  History of developmental delay: Denied. History of grade repetition: Denied. Enrollment in special education courses: Denied. History of LD/ADHD: Denied.  Employment: Retired. He previously worked in [employer] for the majority of his career. 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   Evaluation Results:   Behavioral Observations: Mr. Felter was accompanied by  his wife and daughter, arrived to his appointment on time, and was appropriately dressed and groomed. He appeared alert and oriented. Observed gait and station were mildly slowed but overall within normal limits. Gross motor functioning appeared intact upon informal observation and no abnormal movements (e.g., tremors) were noted. His affect was generally relaxed and positive during interview. Spontaneous speech was fluent and word finding difficulties were not observed during the clinical interview. Thought processes were coherent, organized, and normal in content. Insight into his cognitive difficulties appeared adequate.   During testing, he was noted to experience some cramping and shooting pain in his legs due to him sitting for an extended period of time. Sustained attention was appropriate. Task engagement was adequate and he persisted when challenged. He appeared mildly anxious during testing at times.  He also became tearful towards the end when his father was inadvertently brought up while completing questionnaires. The psychometrist did not explore this tearfulness. Across one test (TMT B), he was noted having difficulty stating the letters of the alphabet in order. Overall, Mr. Radi was cooperative with the clinical interview and subsequent testing procedures.   Adequacy of Effort: The validity of neuropsychological testing is limited by the extent to which the individual being tested may be assumed to have exerted adequate effort during testing. Mr. Franssen expressed his intention to perform to the best of his abilities and exhibited adequate task engagement and persistence. Scores across stand-alone and embedded performance validity measures were within expectation. As such, the results of the current evaluation are believed to be a valid representation of Mr. Lamons current cognitive functioning.  Test Results: Mr. Bunn was largely oriented at the time of the current evaluation. Points  were lost for him incorrectly stating the current date ("22nd").  Intellectual abilities based upon educational and vocational attainment were estimated to be in the average range. Premorbid abilities were estimated to be within the below average range based upon a single-word reading test.   Processing speed was generally well below average. Basic attention was well below average. More complex attention (e.g., working memory) was also well below average. Executive functioning was largely exceptionally low to well below average. He did perform in the average range across a task assessing abstract reasoning. Performance on a task assessing safety/judgment was well below average. Points were lost on this task generally for vague responses rather than incorrect or unsafe responses.   Assessed receptive language abilities were well below average. Primary difficulties were exhibited following multi-step commands. Assessed expressive language was variable. Phonemic fluency was average, semantic fluency was well below average, and confrontation naming was below average.     Assessed visuospatial/visuoconstructional abilities were variable, ranging from the exceptionally low to below average normative ranges. Points were lost in his drawing of a clock due to generally mild abnormalities in numerical arrangement, as well as incorrect hand placement. Points were lost on his copy of a complex figure for some mild spatial abnormalities, as well as him omitting one aspect entirely.    Learning (i.e., encoding) of novel verbal information was exceptionally low to below average. Spontaneous delayed recall (i.e., retrieval) of previously learned information was well below average to below average. Retention rates were 86% across a story learning task, 0% across a list learning task, and 50% across a figure drawing task. Performance across a list learning recognition task was well below average, suggesting limited evidence  for information consolidation.   Results of emotional screening instruments suggested that recent symptoms of generalized anxiety were in the mild range, while symptoms of depression were also within the mild range. A screening instrument assessing recent sleep quality suggested the presence of minimal sleep dysfunction.  Tables of Scores:   Note: This summary of test scores accompanies the interpretive report and should not be considered in isolation without reference to the appropriate sections in the text. Descriptors are based on appropriate normative data and may be adjusted based on clinical judgment. Terms such as "Within Normal Limits" and "Outside Normal Limits" are used when a more specific description of the test score cannot be determined. Descriptors refer to the current evaluation only.         Percentile - Normative Descriptor > 98 - Exceptionally High 91-97 - Well Above Average 75-90 - Above Average 25-74 - Average 9-24 -  Below Average 2-8 - Well Below Average < 2 - Exceptionally Low        Validity:    DESCRIPTOR   July 2021 Current    Dot Counting Test: --- --- --- Within Normal Limits  D-KEFS Color Word Effort Index: --- --- --- Within Normal Limits   ---     Orientation:       Raw Score Raw Score Percentile   NAB Orientation, Form 1 --- 27/29 --- ---        Cognitive Screening:       Raw Score Raw Score Percentile   SLUMS: --- 18/30 --- ---        Intellectual Functioning:       Standard Score Standard Score Percentile   Test of Premorbid Functioning: --- 80 9 Below Average         Standard Score Standard Score Percentile   WRAT-4 Reading: 85 --- --- ---        Memory:      NAB Memory Module, Form 1: T Score T Score Percentile   List Learning        Total Trials 1-3 12/36 (35) 8/36 (19) <1 Exceptionally Low    List B 4/12 (53) 2/12 (39) 14 Below Average    Short Delay Free Recall 2/12 (32) 1/12 (28) 2 Well Below Average    Long Delay Free Recall 2/12  (34) 0/12 (30) 2 Well Below Average    Retention Percentage 100 (54) 0 (24) <1 Exceptionally Low    Recognition Discriminability 3 (---) 1 (36) 8 Well Below Average  Story Learning        Immediate Recall 36/80 (34) 41/80 (37) 9 Below Average    Delayed Recall 16/40 (35) 18/40 (36) 8 Well Below Average    Retention Percentage 67 (---) 86 (51) 54 Average         Raw Score (Scaled Score) Raw Score (Scaled Score) Percentile   RBANS Figure Copy: 15/20 (5) 16/20 (7) 16 Below Average  RBANS Figure Recall: 2/20 (3) 8/20 (7) 16 Below Average        Attention/Executive Function:      Trail Making Test (TMT): T Score Raw Score (T Score) Percentile     Part A 28 56 secs.,  0 errors (36) 8 Well Below Average    Part B 19 258 secs.,  3 errors (33) 5 Well Below Average          Scaled Score Scaled Score Percentile   RBANS Coding: 4 --- --- ---         Scaled Score Scaled Score Percentile   WAIS-IV Coding: --- 4 2 Well Below Average        NAB Attention Module, Form 1: T Score T Score Percentile     Digits Forward 34 34 5 Well Below Average    Digits Backwards _0 Well Below Average         Scaled Score Scaled Score Percentile   WAIS-IV Similarities: --- 8 25 Average        D-KEFS Color-Word Interference Test: Raw Score (Scaled Score) Raw Score (Scaled Score) Percentile     Color Naming --- 47 secs. (5) 5 Well Below Average    Word Reading --- 28 secs. (9) 37 Average    Inhibition --- 162 secs. (1) <1 Exceptionally Low      Total Errors --- 9 errors (4) 2 Well Below Average    Inhibition/Switching --- 70 secs. (11) 63  Average      Total Errors --- 18 errors (1) <1 Exceptionally Low        NAB Executive Functions Module, Form 1: T Score T Score Percentile     Judgment --- 36 8 Well Below Average        Language:      Verbal Fluency Test: T Score Raw Score (T Score) Percentile     Phonemic Fluency (FAS) 37 36 (48) 42 Average    Animal Fluency 46 11 (34) 5 Well Below Average           Raw Score Raw Score Percentile   BDAE Complex Ideational Material: 9/12 --- --- ---        NAB Language Module, Form 1: T Score T Score Percentile     Auditory Comprehension --- 27 1 Exceptionally Low    Naming 28 27/31 (37) 9 Below Average        Visuospatial/Visuoconstruction:       Raw Score Raw Score Percentile   Clock Drawing: 6/10 7/10 --- Within Normal Limits         Scaled Score Scaled Score Percentile   WAIS-IV Block Design: --- 3 1 Exceptionally Low        Mood and Personality:       Raw Score Raw Score Percentile   Geriatric Depression Scale: --- 10 --- Mild  Geriatric Anxiety Scale: --- 19 --- Mild    Somatic --- 7 --- Mild    Cognitive --- 5 --- Mild    Affective --- 7 --- Moderate        Additional Questionnaires:       Raw Score Raw Score Percentile   PROMIS Sleep Disturbance Questionnaire: --- 8 --- None to Slight   Informed Consent and Coding/Compliance:   The current evaluation represents a clinical evaluation for the purposes previously outlined by the referral source and is in no way reflective of a forensic evaluation.   Mr. Treece was provided with a verbal description of the nature and purpose of the present neuropsychological evaluation. Also reviewed were the foreseeable risks and/or discomforts and benefits of the procedure, limits of confidentiality, and mandatory reporting requirements of this provider. The patient was given the opportunity to ask questions and receive answers about the evaluation. Oral consent to participate was provided by the patient.   This evaluation was conducted by Christia Reading, Ph.D., licensed clinical neuropsychologist. Mr. Oyama completed a clinical interview with Dr. Melvyn Novas, billed as one unit [phone], and 140 minutes of cognitive testing and scoring, billed as one unit [phone] and four additional units 96139. Psychometrist Cruzita Lederer, B.S., assisted Dr. Melvyn Novas with test administration and scoring procedures. As a  separate and discrete service, Dr. Melvyn Novas spent a total of 175 minutes in interpretation and report writing billed as one unit [phone] and two units 96133.

## 2020-08-04 NOTE — Progress Notes (Signed)
   Psychometrician Note   Cognitive testing was administered to Ronald Wallace by Cruzita Lederer, B.S. (psychometrist) under the supervision of Dr. Christia Reading, Ph.D., licensed psychologist on 08/04/2020. Ronald Wallace did not appear overtly distressed by the testing session per behavioral observation or responses across self-report questionnaires. Rest breaks were offered.    The battery of tests administered was selected by Dr. Christia Reading, Ph.D. with consideration to Ronald Wallace current level of functioning, the nature of his symptoms, emotional and behavioral responses during interview, level of literacy, observed level of motivation/effort, and the nature of the referral question. This battery was communicated to the psychometrist. Communication between Dr. Christia Reading, Ph.D. and the psychometrist was ongoing throughout the evaluation and Dr. Christia Reading, Ph.D. was immediately accessible at all times. Dr. Christia Reading, Ph.D. provided supervision to the psychometrist on the date of this service to the extent necessary to assure the quality of all services provided.    Ronald Wallace will return within approximately 1-2 weeks for an interactive feedback session with Dr. Melvyn Novas at which time his test performances, clinical impressions, and treatment recommendations will be reviewed in detail. Ronald Wallace understands he can contact our office should he require our assistance before this time.  A total of 140 minutes of billable time were spent face-to-face with Ronald Wallace by the psychometrist. This includes both test administration and scoring time. Billing for these services is reflected in the clinical report generated by Dr. Christia Reading, Ph.D.  This note reflects time spent with the psychometrician and does not include test scores or any clinical interpretations made by Dr. Melvyn Novas. The full report will follow in a separate note.

## 2020-08-06 DIAGNOSIS — E039 Hypothyroidism, unspecified: Secondary | ICD-10-CM | POA: Diagnosis not present

## 2020-08-06 DIAGNOSIS — N1832 Chronic kidney disease, stage 3b: Secondary | ICD-10-CM | POA: Diagnosis not present

## 2020-08-06 DIAGNOSIS — D638 Anemia in other chronic diseases classified elsewhere: Secondary | ICD-10-CM | POA: Diagnosis not present

## 2020-08-06 DIAGNOSIS — E785 Hyperlipidemia, unspecified: Secondary | ICD-10-CM | POA: Diagnosis not present

## 2020-08-06 DIAGNOSIS — E1169 Type 2 diabetes mellitus with other specified complication: Secondary | ICD-10-CM | POA: Diagnosis not present

## 2020-08-11 ENCOUNTER — Other Ambulatory Visit: Payer: Self-pay

## 2020-08-11 ENCOUNTER — Ambulatory Visit (INDEPENDENT_AMBULATORY_CARE_PROVIDER_SITE_OTHER): Payer: HMO | Admitting: Psychology

## 2020-08-11 DIAGNOSIS — G319 Degenerative disease of nervous system, unspecified: Secondary | ICD-10-CM

## 2020-08-11 DIAGNOSIS — F0280 Dementia in other diseases classified elsewhere without behavioral disturbance: Secondary | ICD-10-CM

## 2020-08-11 NOTE — Progress Notes (Signed)
   Neuropsychology Feedback Session Ronald Wallace. [place] [facility]  Reason for Referral:   Ronald Wallace is a 80 y.o. right-handed Caucasian male referred by Ellouise Newer, M.D., to characterize his current cognitive functioning and assist with diagnostic clarity and treatment planning in the context of subjective cognitive decline and prior concerns surrounding mild cognitive impairment of unclear etiology.  Feedback:   Ronald Wallace completed a comprehensive neuropsychological evaluation on 08/04/2020. Please refer to that encounter for the full report and recommendations. Briefly, results suggested somewhat diffuse cognitive impairment with perhaps most prominent deficits exhibited across processing speed, attention/concentration, executive functioning, receptive language, and semantic fluency. Verbal memory was somewhat variable, but also below expectation as a whole. Variability was exhibited across visuospatial abilities. Relative to his previous evaluation, there was a good degree of stability. Performance declines were most notable across semantic fluency and on a list learning task. The etiology of dysfunction remains somewhat unclear but is likely mixed. Neuroimaging suggested mild to moderate small vessel ischemic changes and his medical history is positive for several conditions with effect the vasculature of the body and brain (i.e., type II diabetes, coronary artery disease, hyperlipidemia, hypertension, chronic kidney disease). As such, it seems safe to assume a vascular contribution to his current presentation. Deficits with processing speed, attention/concentration, and executive functioning are consistent with this etiology. However, neuroimaging also revealed notable volume loss (2nd percentile relative to age-matched peers), with particular emphasis on the hippocampus (<1st percentile volume relative to age-matched peers). Both of these findings are  advanced relative to a pure vascular etiology and suggest a co-occurring neurodegenerative condition. Of these conditions, Alzheimer's disease would appear most likely.  Ronald Wallace was accompanied by his wife and daughter during the current telephone call. They were within his residence while I was within my office. I discussed the limitations of evaluation and management by telemedicine and the availability of in person appointments. Ronald Wallace expressed his understanding and agreed to proceed. Content of the current session focused on the results of his neuropsychological evaluation. Ronald Wallace and his family were given the opportunity to ask questions and their questions were answered. They were encouraged to reach out should additional questions arise. A copy of his report was mailed at the conclusion of the visit.      25 minutes were spent conducting the current feedback session with Ronald Wallace, billed as one unit [phone].

## 2020-08-12 ENCOUNTER — Telehealth: Payer: Self-pay | Admitting: Nurse Practitioner

## 2020-08-12 DIAGNOSIS — E1159 Type 2 diabetes mellitus with other circulatory complications: Secondary | ICD-10-CM | POA: Diagnosis not present

## 2020-08-12 DIAGNOSIS — E1169 Type 2 diabetes mellitus with other specified complication: Secondary | ICD-10-CM | POA: Diagnosis not present

## 2020-08-12 DIAGNOSIS — D638 Anemia in other chronic diseases classified elsewhere: Secondary | ICD-10-CM | POA: Diagnosis not present

## 2020-08-12 DIAGNOSIS — N1832 Chronic kidney disease, stage 3b: Secondary | ICD-10-CM | POA: Diagnosis not present

## 2020-08-12 DIAGNOSIS — R0609 Other forms of dyspnea: Secondary | ICD-10-CM | POA: Diagnosis not present

## 2020-08-12 DIAGNOSIS — F419 Anxiety disorder, unspecified: Secondary | ICD-10-CM | POA: Diagnosis not present

## 2020-08-12 DIAGNOSIS — I152 Hypertension secondary to endocrine disorders: Secondary | ICD-10-CM | POA: Diagnosis not present

## 2020-08-12 DIAGNOSIS — E875 Hyperkalemia: Secondary | ICD-10-CM | POA: Diagnosis not present

## 2020-08-12 DIAGNOSIS — I517 Cardiomegaly: Secondary | ICD-10-CM | POA: Diagnosis not present

## 2020-08-12 DIAGNOSIS — Z0000 Encounter for general adult medical examination without abnormal findings: Secondary | ICD-10-CM | POA: Diagnosis not present

## 2020-08-12 DIAGNOSIS — E785 Hyperlipidemia, unspecified: Secondary | ICD-10-CM | POA: Diagnosis not present

## 2020-08-12 DIAGNOSIS — F32A Depression, unspecified: Secondary | ICD-10-CM | POA: Diagnosis not present

## 2020-08-12 DIAGNOSIS — R0600 Dyspnea, unspecified: Secondary | ICD-10-CM | POA: Diagnosis not present

## 2020-08-12 DIAGNOSIS — E669 Obesity, unspecified: Secondary | ICD-10-CM | POA: Diagnosis not present

## 2020-08-12 NOTE — Telephone Encounter (Signed)
STAT if HR is under 50 or over 120 (normal HR is 60-100 beats per minute)  What is your heart rate? 40's-50's  Do you have a log of your heart rate readings (document readings)? Trending 40's -50's has had brady before   Do you have any other symptoms? SOB dizziness

## 2020-08-12 NOTE — Telephone Encounter (Signed)
Spoke with patients wife per release form and heart rate was 45 and now is 49. On his watch is 42. She reports that it has been low before. Dizziness is when he bends over or gets up. Shortness of breath when he first gets up in the morning and then when he goes to bed. Also has this when he walks for a distance. Primary care stopped his losartan due to potassium level. Advised I would send to provider for review and recommendations.

## 2020-08-12 NOTE — Telephone Encounter (Signed)
Notes reviewed.  He has some degree of chronic bradycardia with normal chronotropic competence previously. He also has chronic DOE per our notes.  CXR ordered by PCP today - I cannot see result.  Labs drawn through PCP today appear stable - K better @ 4.7.  Creat stable @ 1.7.  Description of symptoms does sound orthostatic.  This may improve as he gets further away from stopping losartan.  Encourage hydration.  Depending on what CXR shows, he may be able reduce lasix to 1/2 tab daily, but will defer to PCP since he just saw him today and appears to be working this up.

## 2020-08-13 NOTE — Telephone Encounter (Signed)
Spoke with patients wife per release form and discussed providers recommendations. We reviewed orthostatic changes and precautions for that. We also reviewed his chronic bradycardia and shortness of breath with exertion. Recommended he hold onto something with position changes, hydrate, and since they stopped losartan this may help as well with those symptoms. She did state they called her with chest xray results and that it was normal. Advised I would update provider of these results. She verbalized understanding of our conversation with no further questions at this time.

## 2020-09-09 DIAGNOSIS — E1142 Type 2 diabetes mellitus with diabetic polyneuropathy: Secondary | ICD-10-CM | POA: Diagnosis not present

## 2020-09-09 DIAGNOSIS — Z794 Long term (current) use of insulin: Secondary | ICD-10-CM | POA: Diagnosis not present

## 2020-09-14 ENCOUNTER — Telehealth: Payer: Self-pay | Admitting: Cardiovascular Disease

## 2020-09-14 NOTE — Telephone Encounter (Signed)
Was able to reach back out to pt's wife Ronald Wallace regarding her husband's BP and dizziness. Wife reports pt has c/o dizziness 2-3 times a week.   Reports called [facility], but they were unable to take pt call d/t system "being down".  [facility] came out today, did orthostatics on pt, reports BP 108/60 sitting 92/60 standing This was this evening around 3 pm, took his IMDUR and Lasix early in the am  RN stated dizziness and low BP could be contributed to pt's lasix 20 mg daily. Stated pt had no swelling to his ankle or feet and wanted to know if pt could reduce his dosing or stop medication.  Pt takes continues to take IMDUR 30 mg daily  Has not been taking Losartan 50 mg d/t elevated K+ levels, per instruction by PCP.  Advised wife that if [facility] RN advised to hold lasix, then do so until appt on 9/1 with Ignacia Bayley, NP. Continue IMDUR, increase hydration, and sit and rest if spells of dizziness occur. If new or worsening symptoms occur, CP, increase SOB outside of reg SOB, weakness, lightheadedness then week the ED for further evaluation before appt in 2 days.   Wife thankful for the return call and her and Ronald Wallace will be for appt on 9/1 at 08:25 am.

## 2020-09-14 NOTE — Telephone Encounter (Signed)
Pt c/o medication issue:  1. Name of Medication: furosemide   2. How are you currently taking this medication (dosage and times per day)? 20 MG 1 tablet daily  3. Are you having a reaction (difficulty breathing--STAT)? BP low, dizziness   4. What is your medication issue? Patient spouse calling, would like to know if patient should come in sooner.  Not sure if medication is causing issue but wants to discuss.

## 2020-09-16 ENCOUNTER — Encounter: Payer: Self-pay | Admitting: Nurse Practitioner

## 2020-09-16 ENCOUNTER — Ambulatory Visit: Payer: HMO | Admitting: Nurse Practitioner

## 2020-09-16 ENCOUNTER — Other Ambulatory Visit: Payer: Self-pay

## 2020-09-16 VITALS — BP 123/56 | HR 49 | Ht 64.0 in | Wt 200.2 lb

## 2020-09-16 DIAGNOSIS — I5032 Chronic diastolic (congestive) heart failure: Secondary | ICD-10-CM

## 2020-09-16 DIAGNOSIS — N183 Chronic kidney disease, stage 3 unspecified: Secondary | ICD-10-CM

## 2020-09-16 DIAGNOSIS — I25810 Atherosclerosis of coronary artery bypass graft(s) without angina pectoris: Secondary | ICD-10-CM

## 2020-09-16 DIAGNOSIS — E785 Hyperlipidemia, unspecified: Secondary | ICD-10-CM | POA: Diagnosis not present

## 2020-09-16 DIAGNOSIS — I10 Essential (primary) hypertension: Secondary | ICD-10-CM

## 2020-09-16 DIAGNOSIS — I951 Orthostatic hypotension: Secondary | ICD-10-CM

## 2020-09-16 DIAGNOSIS — R0609 Other forms of dyspnea: Secondary | ICD-10-CM

## 2020-09-16 DIAGNOSIS — R0600 Dyspnea, unspecified: Secondary | ICD-10-CM

## 2020-09-16 MED ORDER — FUROSEMIDE 20 MG PO TABS: ORAL_TABLET | ORAL | 3 refills | Status: DC

## 2020-09-16 NOTE — Progress Notes (Addendum)
Office Visit    Patient Name: Ronald Wallace Date of Encounter: 09/16/2020  Primary Care Provider:  [name], MD Primary Cardiologist:  Ida Rogue, MD  Chief Complaint    80 year old male with a history of CAD, type 2 diabetes mellitus, hypertension, hyperlipidemia, neuropathy, stage III chronic kidney disease, and syncope, presents for follow-up related to orthostatic hypotension.  Past Medical History    Past Medical History:  Diagnosis Date   Anemia of chronic disease 07/01/2015   Arthritis    Benign prostatic hyperplasia with nocturia 10/30/2016   Bradycardia 03/12/2014   Bruit of left carotid artery 03/12/2014   CKD (chronic kidney disease) 09/27/2015   stage 3, GFR 30-59 ml/min   Coronary artery disease involving native coronary artery of native heart without angina pectoris    a. 02/2014 Neg Ex MV, EF 65%;  b. 06/2015 Abnl MV ([facility])-->Cath ([facility]): LM nl, LAD 30p/m, LCX 30p, OM2 50p, RCA 100p w/ L-->R collats, nl EF-->Med Rx; c. 10/2017 MV: No isch/scar; d. 11/2017 Cath: LM nl, LAD 45p/m, LCX 30ost, OM2 30, RCA 100 CTO, L->R collats. PA 31/16/22, PCWP 11; e. 05/2020 MV: EF 67%, no isch/scar->low risk.   Diabetes mellitus, type II, insulin dependent    Diabetic neuropathy    Diabetic retinopathy associated with type 2 diabetes mellitus 10/28/2018   Disorder of bone and cartilage    Diverticulitis    DOE (dyspnea on exertion) 03/12/2014   Generalized anxiety disorder    GERD (gastroesophageal reflux disease)    Hypertension associated with diabetes    Major depressive disorder    Major neurocognitive disorder due to multiple etiologies 08/04/2020   likely Alzheimer's disease w/vascular contributions   Mixed hyperlipidemia due to type 2 diabetes mellitus 02/06/2017   Obesity    Osteoporosis    Pure hypercholesterolemia    Syncope and collapse    a. 02/2014 Carotid Dopplers: 1-39% bilateral ICA with normal Subclavian & vertebral arteries; b. 02/2014 2  D Echo: EF 50-55%, Gr 1 DD, Mild MR & LA dilation -- was bradycardic in 40s; c. 04/2014 Event monitor: RSR/SA/SB. Occas PVC's. No AFib.   Past Surgical History:  Procedure Laterality Date   CARDIAC CATHETERIZATION Left 07/07/2015   Procedure: Left Heart Cath and Coronary Angiography;  Surgeon: Isaias Cowman, MD;  Location: [facility];  Service: Cardiovascular;  Laterality: Left;   CARPAL TUNNEL RELEASE     CATARACT EXTRACTION Right    CATARACT EXTRACTION W/PHACO Left 10/06/2015   Procedure: CATARACT EXTRACTION PHACO AND INTRAOCULAR LENS PLACEMENT ([place]);  Surgeon: Estill Cotta, MD;  Location: [facility];  Service: Ophthalmology;  Laterality: Left;  [place] 01:42 AP% 25.1 CDE 45.92 Fluid pack lot # [id number]   COLONOSCOPY     COLONOSCOPY WITH PROPOFOL N/A 12/04/2018   Procedure: COLONOSCOPY WITH PROPOFOL;  Surgeon: Toledo, Benay Pike, MD;  Location: [facility];  Service: Gastroenterology;  Laterality: N/A;   NM MYOVIEW LTD  03/13/2014   LOW RISK - positive EKG but negative imaging: 2 mm ST Depression in V4 & V5 but No scintigraphic evidence of Ischemia or Infarct o   RIGHT/LEFT HEART CATH AND CORONARY ANGIOGRAPHY N/A 11/21/2017   Procedure: RIGHT/LEFT HEART CATH AND CORONARY ANGIOGRAPHY;  Surgeon: Minna Merritts, MD;  Location: [facility];  Service: Cardiovascular;  Laterality: N/A;   TRANSTHORACIC ECHOCARDIOGRAM  03/12/2014   EF 50-55%, Gr 1 DD, no RWMA, Mlid MR & LA dil, Normla RV and bradycardic 40s -    Allergies  Allergies  Allergen Reactions   Amlodipine     Other reaction(s): Other (See Comments) Peripheral edema    History of Present Illness    80 year old male with the above past medical history including CAD, type 2 diabetes mellitus, hypertension, hyperlipidemia, neuropathy, stage III chronic kidney disease, and syncope.  He was previously evaluated for syncope in early 2016 with an echocardiogram that showed normal LV function, carotid ultrasound  that showed no significant disease, and event monitoring showing no significant arrhythmias.  Stress testing in 2017 in the setting of progressive dyspnea was followed by diagnostic catheterization revealing normal LV function, chronic total occlusion of the right coronary artery, and otherwise nonobstructive disease.  In the fall 2019, he had worsening dyspnea and underwent stress testing in October 2019, showing no significant ischemia.  However, in the setting of progressive symptoms, he underwent repeat catheterization in November 2019 showing stable, moderate LAD and circumflex disease, with a chronic total occlusion of the right coronary artery, and left-to-right collaterals.  Right heart pressures were relatively normal and again, medical therapy was recommended along with cardiac rehab enrollment.  In April 2022, he reported worsening dyspnea on exertion.  He had not started an exercise program.  He was bradycardic with rates in the 40s at baseline though chronotropic competence was normal with ambulation.  Stress testing was again undertaken and was low risk.  In the setting of mild lower extremity edema and ongoing dyspnea, he was switched from HCTZ to low-dose Lasix in May of this year.  Mr. Dacquisto was last seen in clinic in June, at which time he continued to report dyspnea on exertion.  Since then, losartan therapy was discontinued in the setting of elevated K+. His wife contacted our office on August 30, noting that he has been having increasing dizziness with a report of drop in blood pressure with standing.  He was advised to hydrate and hold Lasix.  Today, patient is mildly orthostatic when moving from a lying to a seated position.  He notes that he is most likely have symptoms when moving from a seated to standing position at home.  He has not had syncope.  He does not routinely weigh himself but feels as though his lower extremity swelling has been well controlled.  He has been off of Lasix for  2 days now.  He denies chest pain though continues to have chronic dyspnea on exertion associated with fatigue.  He also occasionally notes mild air hunger when first lying down in bed at night which resolves after taking a good breath.  Denies palpitations, PND, or early satiety.  Home Medications    Current Outpatient Medications  Medication Sig Dispense Refill   acetaminophen (TYLENOL) 325 MG tablet Take 650 mg by mouth every 6 (six) hours as needed.     aspirin EC 81 MG tablet Take 81 mg by mouth daily.     calcium carbonate (OS-CAL) 600 MG TABS tablet Take 600 mg by mouth daily with breakfast.     cholecalciferol (VITAMIN D) 1000 UNITS tablet Take 2,000 Units by mouth daily.      donepezil (ARICEPT) 10 MG tablet Take 10 mg by mouth daily.     escitalopram (LEXAPRO) 10 MG tablet Take 1 tablet by mouth daily.     ferrous sulfate 325 (65 FE) MG tablet Take 325 mg by mouth daily with breakfast.     furosemide (LASIX) 20 MG tablet Take 1 tablet (20 mg total) by mouth daily. 90 tablet 3  gabapentin (NEURONTIN) 300 MG capsule Take 300-600 mg by mouth See admin instructions. Take 1 capsule (300 mg) by mouth in the morning & 2 capsules (600 mg) by mouth every night.     isosorbide mononitrate (IMDUR) 30 MG 24 hr tablet Take 1 tablet by mouth once daily 90 tablet 0   LEVEMIR FLEXTOUCH 100 UNIT/ML FlexPen Inject 40 Units into the skin at bedtime.      levothyroxine (SYNTHROID) 50 MCG tablet Take 50 mcg by mouth daily before breakfast.     metFORMIN (GLUCOPHAGE) 500 MG tablet Take 250 mg by mouth 2 (two) times daily with a meal.     NOVOLOG 100 UNIT/ML injection Inject 20-38 Units into the skin 3 (three) times daily with meals. Sliding scale insulin base rates= 20 units in the morning, 26 units with lunch, 26 units with supper.     simvastatin (ZOCOR) 20 MG tablet Take 20 mg by mouth daily.     tamsulosin (FLOMAX) 0.4 MG CAPS capsule Take 0.4 mg by mouth daily. Takes 2 each morning.     losartan  (COZAAR) 50 MG tablet Take 50 mg by mouth daily.  (Patient not taking: Reported on 09/16/2020)     Vibegron (GEMTESA) 75 MG TABS Take 1 tablet by mouth daily. (Patient not taking: Reported on 09/16/2020) 30 tablet 11   No current facility-administered medications for this visit.     Review of Systems    He has chronic dyspnea on exertion and fatigue, which is unchanged.  He has not started a walking program.  He does not experience chest pain.  Has had some orthostatic lightheadedness as outlined above.  Occasional, mild ankle edema which has been stable.  All other systems reviewed and are otherwise negative except as noted above.  Physical Exam    VS:  BP (!) 123/56 (BP Location: Left Arm, Patient Position: Sitting, Cuff Size: Normal)   Pulse (!) 49   Ht '5\' 4"'$  (1.626 m)   Wt 200 lb 4 oz (90.8 kg)   SpO2 97%   BMI 34.37 kg/m  , BMI Body mass index is 34.37 kg/m.    Orthostatic VS for the past 24 hrs:  BP- Lying Pulse- Lying BP- Sitting Pulse- Sitting BP- Standing at 0 minutes Pulse- Standing at 0 minutes  09/16/20 0817 136/60 (!) 46 120/62 (!) 48 131/58 (!) 49    GEN: Well nourished, well developed, in no acute distress. HEENT: normal. Neck: Supple, no JVD, carotid bruits, or masses. Cardiac: RRR, no murmurs, rubs, or gallops. No clubbing, cyanosis, trace bilateral lower extremity edema just above the sock line.  PT 1+ and equal bilaterally.  Respiratory:  Respirations regular and unlabored, clear to auscultation bilaterally. GI: Soft, nontender, nondistended, BS + x 4. MS: no deformity or atrophy. Skin: warm and dry, no rash. Neuro:  Strength and sensation are intact. Psych: Normal affect.  Accessory Clinical Findings    ECG personally reviewed by me today -sinus bradycardia, 49 - no acute changes.  Lab Results  Component Value Date   WBC 7.4 06/18/2019   HGB 10.9 (L) 06/18/2019   HCT 32.0 (L) 06/18/2019   MCV 89.5 06/18/2019   PLT 151.0 06/18/2019   Lab Results   Component Value Date   CREATININE 1.43 (H) 06/07/2020   BUN 27 (H) 06/07/2020   NA 138 06/07/2020   K 4.5 06/07/2020   CL 105 06/07/2020   CO2 26 06/07/2020   Lab Results  Component Value Date   ALT 17  06/18/2019   AST 16 06/18/2019   ALKPHOS 53 06/18/2019   BILITOT 0.4 06/18/2019   Assessment & Plan    1.  Orthostatic hypotension: Patient with recent increase in orthostatic symptoms and lightheadedness.  No syncope.  Recently documented orthostatic blood pressures by home health nurse prompting visit today.  Here, he did drop his systolic blood pressure from 1 36-120 when moving from a lying to a seated position.  This was associated with dizziness.  He has been on Lasix 20 mg daily.  He also takes isosorbide mononitrate in the setting of known coronary disease and chronic dyspnea.  Lasix has been on hold for the past day.  I will check a basic metabolic panel.  I have encouraged him to continue to hold Lasix and only use on an as-needed basis for lower extremity edema.  He was previously on losartan which was discontinued secondary to hyperkalemia earlier in the summer.  He was advised to start back at a lower dose in July but never did.  At this time, I advised that he continue to hold off on losartan.  2.  Chronic dyspnea on exertion/HFpEF: Euvolemic on exam today with only trace edema above his sock line.  Chronic dyspnea is unchanged.  Heart rate and blood pressure well controlled.  As above, changing Lasix to as needed.  3.  Coronary artery disease: Status post catheterization 2019 revealing a chronic total occlusion of the right coronary artery with left-to-right collaterals and otherwise moderate, nonobstructive disease in the left coronary tree.  He had a low risk Myoview in early May.  He does not experience chest pain.  Continue aspirin, nitrate, and statin.  4.  Essential hypertension: As above, orthostatic today.  See #1.  5.  Hyperlipidemia: LDL 41 in March of this year.   Continue statin therapy.  6.  Type 2 diabetes mellitus: A1c up to 8.1 on August 25.  This is managed by his primary care provider.  On insulin and metformin.  With orthostasis, not likely a good candidate for SGLT2 inhibitor at this time.  7.  Stage III chronic kidney disease: Creatinine 1.7 by labs on July 28.  In the setting of orthostasis, we will follow-up labs today.  As above, Lasix changed to as needed.  8.  Sinus bradycardia:  chronic and stable w/ nl chronotropic competence demonstrated previously.  Pt has apple watch to monitor HRs @ home.  9.  Disposition: Follow-up basic metabolic panel today.  Follow-up in clinic in 4 to 6 weeks or sooner if necessary.   Murray Hodgkins, NP 09/16/2020, 8:41 AM

## 2020-09-16 NOTE — Patient Instructions (Signed)
Medication Instructions:  - Your physician has recommended you make the following change in your medication:   1) CHANGE lasix (furosemide) 20 mg -take 1 tablet by mouth once daily as needed for increased swelling/ weight gain/ shortness of breath  *If you need a refill on your cardiac medications before your next appointment, please call your pharmacy*   Lab Work: - Your physician recommends that you have lab work today: BMP  If you have labs (blood work) drawn today and your tests are completely normal, you will receive your results only by: [place] (if you have [facility]) OR A paper copy in the mail If you have any lab test that is abnormal or we need to change your treatment, we will call you to review the results.   Testing/Procedures: - none ordered   Follow-Up: At [facility], you and your health needs are our priority.  As part of our continuing mission to provide you with exceptional heart care, we have created designated Provider Care Teams.  These Care Teams include your primary Cardiologist (physician) and Advanced Practice Providers (APPs -  Physician Assistants and Nurse Practitioners) who all work together to provide you with the care you need, when you need it.  We recommend signing up for the patient portal called "MyChart".  Sign up information is provided on this After Visit Summary.  MyChart is used to connect with patients for Virtual Visits (Telemedicine).  Patients are able to view lab/test results, encounter notes, upcoming appointments, etc.  Non-urgent messages can be sent to your provider as well.   To learn more about what you can do with MyChart, go to [email or web address].    Your next appointment:   6 week(s)  The format for your next appointment:   In Person  Provider:   You may see Ida Rogue, MD or one of the following Advanced Practice Providers on your designated Care Team:   Murray Hodgkins, NP    Other  Instructions N/a

## 2020-09-17 ENCOUNTER — Telehealth: Payer: Self-pay | Admitting: *Deleted

## 2020-09-17 DIAGNOSIS — E875 Hyperkalemia: Secondary | ICD-10-CM

## 2020-09-17 LAB — BASIC METABOLIC PANEL
BUN/Creatinine Ratio: 20 (ref 10–24)
BUN: 30 mg/dL — ABNORMAL HIGH (ref 8–27)
CO2: 25 mmol/L (ref 20–29)
Calcium: 9.5 mg/dL (ref 8.6–10.2)
Chloride: 101 mmol/L (ref 96–106)
Creatinine, Ser: 1.49 mg/dL — ABNORMAL HIGH (ref 0.76–1.27)
Glucose: 214 mg/dL — ABNORMAL HIGH (ref 65–99)
Potassium: 5.4 mmol/L — ABNORMAL HIGH (ref 3.5–5.2)
Sodium: 139 mmol/L (ref 134–144)
eGFR: 48 mL/min/{1.73_m2} — ABNORMAL LOW (ref >59)

## 2020-09-17 NOTE — Telephone Encounter (Signed)
-----   Message from Theora Gianotti, NP sent at 09/17/2020  2:38 PM EDT ----- Kidney function is stable.  Potassium is elevated at 5.4.  Cont with plan for lasix as needed only.  Ensure that he is not taking any potassium supplements.  Remain off of losartan.  Avoid high potassium foods.  He should have a f/u bmet on Monday to ensure stability.

## 2020-09-17 NOTE — Telephone Encounter (Signed)
Left voicemail message for patient to call back for review of results and recommendations.  

## 2020-09-24 ENCOUNTER — Encounter: Payer: Self-pay | Admitting: Neurology

## 2020-09-24 ENCOUNTER — Ambulatory Visit: Payer: HMO | Admitting: Neurology

## 2020-09-24 ENCOUNTER — Other Ambulatory Visit: Payer: Self-pay

## 2020-09-24 VITALS — BP 122/65 | HR 54 | Ht 68.5 in | Wt 203.0 lb

## 2020-09-24 DIAGNOSIS — F01518 Vascular dementia, unspecified severity, with other behavioral disturbance: Secondary | ICD-10-CM

## 2020-09-24 DIAGNOSIS — F0151 Vascular dementia with behavioral disturbance: Secondary | ICD-10-CM

## 2020-09-24 DIAGNOSIS — G309 Alzheimer's disease, unspecified: Secondary | ICD-10-CM

## 2020-09-24 MED ORDER — ESCITALOPRAM OXALATE 20 MG PO TABS: 20.0000 mg | ORAL_TABLET | Freq: Every day | ORAL | 3 refills | Status: DC

## 2020-09-24 MED ORDER — DONEPEZIL HCL 10 MG PO TABS: 10.0000 mg | ORAL_TABLET | Freq: Every day | ORAL | 3 refills | Status: DC

## 2020-09-24 NOTE — Patient Instructions (Signed)
Increase Lexapro (escitalopram) to '20mg'$  daily  2. Contact our office in a month for an update. If no problems with the Lexapro, we will start Memantine to help slow down memory loss.   3. Continue Donepezil '10mg'$  daily  4. Continue to monitor driving, have someone with you at all times or family to drive.   5. Follow-up in 6 months, call for any changes   FALL PRECAUTIONS: Be cautious when walking. Scan the area for obstacles that may increase the risk of trips and falls. When getting up in the mornings, sit up at the edge of the bed for a few minutes before getting out of bed. Consider elevating the bed at the head end to avoid drop of blood pressure when getting up. Walk always in a well-lit room (use night lights in the walls). Avoid area rugs or power cords from appliances in the middle of the walkways. Use a walker or a cane if necessary and consider physical therapy for balance exercise. Get your eyesight checked regularly.  FINANCIAL OVERSIGHT: Supervision, especially oversight when making financial decisions or transactions is also recommended.  HOME SAFETY: Consider the safety of the kitchen when operating appliances like stoves, microwave oven, and blender. Consider having supervision and share cooking responsibilities until no longer able to participate in those. Accidents with firearms and other hazards in the house should be identified and addressed as well.  DRIVING: Regarding driving, in patients with progressive memory problems, driving will be impaired. We advise to have someone else do the driving if trouble finding directions or if minor accidents are reported. Independent driving assessment is available to determine safety of driving.  ABILITY TO BE LEFT ALONE: If patient is unable to contact 911 operator, consider using LifeLine, or when the need is there, arrange for someone to stay with patients. Smoking is a fire hazard, consider supervision or cessation. Risk of wandering  should be assessed by caregiver and if detected at any point, supervision and safe proof recommendations should be instituted.  MEDICATION SUPERVISION: Inability to self-administer medication needs to be constantly addressed. Implement a mechanism to ensure safe administration of the medications.  RECOMMENDATIONS FOR ALL PATIENTS WITH MEMORY PROBLEMS: 1. Continue to exercise (Recommend 30 minutes of walking everyday, or 3 hours every week) 2. Increase social interactions - continue going to [place] and enjoy social gatherings with friends and family 3. Eat healthy, avoid fried foods and eat more fruits and vegetables 4. Maintain adequate blood pressure, blood sugar, and blood cholesterol level. Reducing the risk of stroke and cardiovascular disease also helps promoting better memory. 5. Avoid stressful situations. Live a simple life and avoid aggravations. Organize your time and prepare for the next day in anticipation. 6. Sleep well, avoid any interruptions of sleep and avoid any distractions in the bedroom that may interfere with adequate sleep quality 7. Avoid sugar, avoid sweets as there is a strong link between excessive sugar intake, diabetes, and cognitive impairment We discussed the Mediterranean diet, which has been shown to help patients reduce the risk of progressive memory disorders and reduces cardiovascular risk. This includes eating fish, eat fruits and green leafy vegetables, nuts like almonds and hazelnuts, walnuts, and also use olive oil. Avoid fast foods and fried foods as much as possible. Avoid sweets and sugar as sugar use has been linked to worsening of memory function.  There is always a concern of gradual progression of memory problems. If this is the case, then we may need to adjust level of  care according to patient needs. Support, both to the patient and caregiver, should then be put into place.       Mediterranean Diet  Why follow it? Research shows. Those who follow  the Mediterranean diet have a reduced risk of heart disease  The diet is associated with a reduced incidence of Parkinson's and Alzheimer's diseases People following the diet may have longer life expectancies and lower rates of chronic diseases  The Dietary Guidelines for Americans recommends the Mediterranean diet as an eating plan to promote health and prevent disease  What Is the Mediterranean Diet?  Healthy eating plan based on typical foods and recipes of Mediterranean-style cooking The diet is primarily a plant based diet; these foods should make up a majority of meals   Starches - Plant based foods should make up a majority of meals - They are an important sources of vitamins, minerals, energy, antioxidants, and fiber - Choose whole grains, foods high in fiber and minimally processed items  - Typical grain sources include wheat, oats, barley, corn, brown rice, bulgar, farro, millet, polenta, couscous  - Various types of beans include chickpeas, lentils, fava beans, black beans, white beans   Fruits  Veggies - Large quantities of antioxidant rich fruits & veggies; 6 or more servings  - Vegetables can be eaten raw or lightly drizzled with oil and cooked  - Vegetables common to the traditional Mediterranean Diet include: artichokes, arugula, beets, broccoli, brussel sprouts, cabbage, carrots, celery, collard greens, cucumbers, eggplant, kale, leeks, lemons, lettuce, mushrooms, okra, onions, peas, peppers, potatoes, pumpkin, radishes, rutabaga, shallots, spinach, sweet potatoes, turnips, zucchini - Fruits common to the Mediterranean Diet include: apples, apricots, avocados, cherries, clementines, dates, figs, grapefruits, grapes, melons, nectarines, oranges, peaches, pears, pomegranates, strawberries, tangerines  Fats - Replace butter and margarine with healthy oils, such as olive oil, canola oil, and tahini  - Limit nuts to no more than a handful a day  - Nuts include walnuts, almonds,  pecans, pistachios, pine nuts  - Limit or avoid candied, honey roasted or heavily salted nuts - Olives are central to the [employer] - can be eaten whole or used in a variety of dishes   Meats Protein - Limiting red meat: no more than a few times a month - When eating red meat: choose lean cuts and keep the portion to the size of deck of cards - Eggs: approx. 0 to 4 times a week  - Fish and lean poultry: at least 2 a week  - Healthy protein sources include, chicken, [place], lean beef, lamb - Increase intake of seafood such as tuna, salmon, trout, mackerel, shrimp, scallops - Avoid or limit high fat processed meats such as sausage and bacon  Dairy - Include moderate amounts of low fat dairy products  - Focus on healthy dairy such as fat free yogurt, skim milk, low or reduced fat cheese - Limit dairy products higher in fat such as whole or 2% milk, cheese, ice cream  Alcohol - Moderate amounts of red wine is ok  - No more than 5 oz daily for women (all ages) and men older than age 80  - No more than 10 oz of wine daily for men younger than 2  Other - Limit sweets and other desserts  - Use herbs and spices instead of salt to flavor foods  - Herbs and spices common to the traditional Mediterranean Diet include: basil, bay leaves, chives, cloves, cumin, fennel, garlic, lavender, marjoram, mint, oregano, parsley, pepper,  rosemary, sage, savory, sumac, tarragon, thyme   It's not just a diet, it's a lifestyle:  The Mediterranean diet includes lifestyle factors typical of those in the region  Foods, drinks and meals are best eaten with others and savored Daily physical activity is important for overall good health This could be strenuous exercise like running and aerobics This could also be more leisurely activities such as walking, housework, yard-work, or taking the stairs Moderation is the key; a balanced and healthy diet accommodates most foods and drinks Consider portion sizes and  frequency of consumption of certain foods   Meal Ideas & Options:  Breakfast:  Whole wheat toast or whole wheat English muffins with peanut butter & hard boiled egg Steel cut oats topped with apples & cinnamon and skim milk  Fresh fruit: banana, strawberries, melon, berries, peaches  Smoothies: strawberries, bananas, greek yogurt, peanut butter Low fat greek yogurt with blueberries and granola  Egg white omelet with spinach and mushrooms Breakfast couscous: whole wheat couscous, apricots, skim milk, cranberries  Sandwiches:  Hummus and grilled vegetables (peppers, zucchini, squash) on whole wheat bread   Grilled chicken on whole wheat pita with lettuce, tomatoes, cucumbers or tzatziki  [place] salad on whole wheat bread: tuna salad made with greek yogurt, olives, red peppers, capers, green onions Garlic rosemary lamb pita: lamb sauted with garlic, rosemary, salt & pepper; add lettuce, cucumber, greek yogurt to pita - flavor with lemon juice and black pepper  Seafood:  Mediterranean grilled salmon, seasoned with garlic, basil, parsley, lemon juice and black pepper Shrimp, lemon, and spinach whole-grain pasta salad made with low fat greek yogurt  Seared scallops with lemon orzo  Seared tuna steaks seasoned salt, pepper, coriander topped with tomato mixture of olives, tomatoes, olive oil, minced garlic, parsley, green onions and cappers  Meats:  Herbed greek chicken salad with kalamata olives, cucumber, feta  Red bell peppers stuffed with spinach, bulgur, lean ground beef (or lentils) & topped with feta   Kebabs: skewers of chicken, tomatoes, onions, zucchini, squash  [place] burgers: made with red onions, mint, dill, lemon juice, feta cheese topped with roasted red peppers Vegetarian Cucumber salad: cucumbers, artichoke hearts, celery, red onion, feta cheese, tossed in olive oil & lemon juice  Hummus and whole grain pita points with a greek salad (lettuce, tomato, feta, olives, cucumbers, red  onion) Lentil soup with celery, carrots made with vegetable broth, garlic, salt and pepper  Tabouli salad: parsley, bulgur, mint, scallions, cucumbers, tomato, radishes, lemon juice, olive oil, salt and pepper.

## 2020-09-24 NOTE — Progress Notes (Signed)
NEUROLOGY FOLLOW UP OFFICE NOTE  Ronald Wallace [id number] 1940/10/15  HISTORY OF PRESENT ILLNESS: I had the pleasure of seeing Ronald Wallace in follow-up in the neurology clinic on 09/24/2020.  The patient was last seen 8 months ago for memory loss. He is again accompanied by his daughter Ronald Wallace who helps supplement the history today, his wife is on speakerphone.  Records and images were personally reviewed where available.  Repeat Neuropsychological evaluation in 07/2020 indicated transition to Major Neurocognitive Disorder (dementia) compared to prior evaluation. Although there was a good degree of stability compared to prior testing, there was diffuse cognitive impairment with most prominent deficits across processing speed, attention/concentration, executive functioning, receptive language, and semantic fluency. It was noted his affect was much improved compared to prior, which may account for mild improvements in some domains. 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  He feels his memory depends on his mood, "sometimes I can't remember too good." His wife notes it comes and goes, "not a whole lot worse." He continues to manage his medications, his wife checks behind him and notes he is doing good. She is always with him now when driving, or their grandson would drive for them. His wife manages finances. He has seen Cardiology for air hunger, it was felt this is not cardiac but related to anxiety and stress. He notes he gets frustrated over things. No paranoia or hallucinations. He sleeps well. He has headaches "all the time," no change in pattern. He has occasional dizzy spells, his wife reports he is doing better with this since he was taken off his fluid pill. No falls.    History on Initial Assessment 06/18/2019: This is a 80 year old right-handed man with a history of hypertension, hyperlipidemia, diabetes, CAD, CKD, bradycardia, anxiety, depression, presenting for evaluation of memory  loss. His daughter,as well as wife on speakerphone, are present to provide additional information. Records were reviewed. He was seen 6 days ago at [facility] by neurologist Dr. Manuella Wallace, however they would still like to proceed with visit today for the same concern. SLUMS score was 18/30.  Bloodwork was ordered, he is scheduled for an MRI brain with NeuroQuant sequence. His family feels that memory changes started only a month ago. He became tearful stating that "I cannot remember things," making him frustrated. He states "some things I can remember, but others I do not." He would forget a conversation from 5 minutes prior. He used to remember announcer names well. He lives with his wife who has to double check behind him on his insulin, telling him what dosage to take. He writes down bills and his wife pays them. He got lost driving twice in the past month. He got lost driving to their granddaughter's graduation, he apparently got very upset/agitated. He does not remember this. He does recall getting lost driving to get gas. He states he "just about quit driving." Family now has a GPS tracker, his wife is always in the car with him now. He is independent with dressing and bathing.  His daughter reports that he used to exercise more, then with the pandemic he got depressed. Family noticed that he did not want to be around his grandchildren anymore, getting easily agitated. Some people say something and it "sends him off to another pedestal." He can be so sweet sometimes then curl up and not want to talk to anybody. He has always had issues with his siblings, "always been a part of him," this seems  to be getting better. They do feel that since memory changes started, he is doing better with other people. Paxil was recently changed to Lexapro, which he has been taking for only 2 days. No paranoia or hallucinations. He was started on Donepezil '5mg'$  daily 3 weeks ago which he is overall tolerating. No family history  of dementia. He does not drink alcohol. He denies any significant head injuries. He had a syncopal episode in 2018 where he woke up on the ground at work. I personally reviewed MRI brain with and without contrast done 11/2016 which did not show any acute changes. There was mild diffuse atrophy and mild to moderate chronic microvascular disease.   He used to have frequent headaches when he was working and they quieted down, but recently come back "like they used to be." There is no associated nausea/vomiting, photo/phonophobia. Family thinks it is more stress-related. He denies any dizziness, diplopia, dysarthria/dysphagia, bowel/bladder dysfunction, anosmia. He has chronic neck and back pain. He has some tingling and numbness in his hands and feet. He has occasional hand tremors. Sleep is pretty good. They deny any REM behavior disorder, but he has fallen out of bed while sleeping. This was initially happening every 2-3 weeks, but has quieted down, last occurred 3 weeks ago. Family reports he used to exercise more but stopped when the pandemic hit.  Laboratory Data: MRI brain without contrast done 06/2019 did not show any acute changes. There was moderate chronic microvascular disease, diffuse volume loss with whole brain volume at 2nd percentile.  Neuropsychological testing done in 07/2019 noted that testing must be interpreted cautiously as he was tearful and visibly upset throughout much of the evaluation. He did not show clear memory storage problems and instead had notable encoding issues and relatively preserved retention of information across time on several tasks. He screened positive for depression. Diagnosis was Mild Cognitive Impairment, etiology unclear, possibly mixed AD and vascular, with interference from depression and anxiety.   PAST MEDICAL HISTORY: Past Medical History:  Diagnosis Date   Anemia of chronic disease 07/01/2015   Arthritis    Benign prostatic hyperplasia with nocturia  10/30/2016   Bradycardia 03/12/2014   Bruit of left carotid artery 03/12/2014   CKD (chronic kidney disease) 09/27/2015   stage 3, GFR 30-59 ml/min   Coronary artery disease involving native coronary artery of native heart without angina pectoris    a. 02/2014 Neg Ex MV, EF 65%;  b. 06/2015 Abnl MV ([facility])-->Cath ([facility]): LM nl, LAD 30p/m, LCX 30p, OM2 50p, RCA 100p w/ L-->R collats, nl EF-->Med Rx; c. 10/2017 MV: No isch/scar; d. 11/2017 Cath: LM nl, LAD 45p/m, LCX 30ost, OM2 30, RCA 100 CTO, L->R collats. PA 31/16/22, PCWP 11; e. 05/2020 MV: EF 67%, no isch/scar->low risk.   Diabetes mellitus, type II, insulin dependent    Diabetic neuropathy    Diabetic retinopathy associated with type 2 diabetes mellitus 10/28/2018   Disorder of bone and cartilage    Diverticulitis    DOE (dyspnea on exertion) 03/12/2014   Generalized anxiety disorder    GERD (gastroesophageal reflux disease)    Hypertension associated with diabetes    Major depressive disorder    Major neurocognitive disorder due to multiple etiologies 08/04/2020   likely Alzheimer's disease w/vascular contributions   Mixed hyperlipidemia due to type 2 diabetes mellitus 02/06/2017   Obesity    Osteoporosis    Pure hypercholesterolemia    Syncope and collapse    a. 02/2014 Carotid Dopplers: 1-39% bilateral  ICA with normal Subclavian & vertebral arteries; b. 02/2014 2 D Echo: EF 50-55%, Gr 1 DD, Mild MR & LA dilation -- was bradycardic in 40s; c. 04/2014 Event monitor: RSR/SA/SB. Occas PVC's. No AFib.    MEDICATIONS: Current Outpatient Medications on File Prior to Visit  Medication Sig Dispense Refill   acetaminophen (TYLENOL) 325 MG tablet Take 650 mg by mouth every 6 (six) hours as needed.     aspirin EC 81 MG tablet Take 81 mg by mouth daily.     calcium carbonate (OS-CAL) 600 MG TABS tablet Take 600 mg by mouth daily with breakfast.     cholecalciferol (VITAMIN D) 1000 UNITS tablet Take 2,000 Units by mouth daily.       donepezil (ARICEPT) 10 MG tablet Take 10 mg by mouth daily.     escitalopram (LEXAPRO) 10 MG tablet Take 1 tablet by mouth daily.     ferrous sulfate 325 (65 FE) MG tablet Take 325 mg by mouth daily with breakfast.     furosemide (LASIX) 20 MG tablet Take 1 tablet (20 mg) by mouth once daily as needed for swelling/ weight gain/ shortness of breath 90 tablet 3   gabapentin (NEURONTIN) 300 MG capsule Take 300-600 mg by mouth See admin instructions. Take 1 capsule (300 mg) by mouth in the morning & 2 capsules (600 mg) by mouth every night.     isosorbide mononitrate (IMDUR) 30 MG 24 hr tablet Take 1 tablet by mouth once daily 90 tablet 0   LEVEMIR FLEXTOUCH 100 UNIT/ML FlexPen Inject 40 Units into the skin at bedtime.      levothyroxine (SYNTHROID) 50 MCG tablet Take 50 mcg by mouth daily before breakfast.     losartan (COZAAR) 50 MG tablet Take 50 mg by mouth daily.  (Patient not taking: Reported on 09/16/2020)     metFORMIN (GLUCOPHAGE) 500 MG tablet Take 250 mg by mouth 2 (two) times daily with a meal.     NOVOLOG 100 UNIT/ML injection Inject 20-38 Units into the skin 3 (three) times daily with meals. Sliding scale insulin base rates= 20 units in the morning, 26 units with lunch, 26 units with supper.     simvastatin (ZOCOR) 20 MG tablet Take 20 mg by mouth daily.     tamsulosin (FLOMAX) 0.4 MG CAPS capsule Take 0.4 mg by mouth daily. Takes 2 each morning.     Vibegron (GEMTESA) 75 MG TABS Take 1 tablet by mouth daily. (Patient not taking: Reported on 09/16/2020) 30 tablet 11   No current facility-administered medications on file prior to visit.    ALLERGIES: Allergies  Allergen Reactions   Amlodipine     Other reaction(s): Other (See Comments) Peripheral edema    FAMILY HISTORY: Family History  Problem Relation Age of Onset   Heart attack Mother    Breast cancer Mother    Lung cancer Father     SOCIAL HISTORY: Social History   Socioeconomic History   Marital status: Married     Spouse name: Not on file   Number of children: Not on file   Years of education: 14   Highest education level: Associate degree: academic program  Occupational History   Occupation: Retired    Comment: Youth worker  Tobacco Use   Smoking status: Former    Types: Pipe    Quit date: 1965    Years since quitting: 57.7   Smokeless tobacco: Never  Vaping Use   Vaping Use: Never used  Substance and Sexual  Activity   Alcohol use: No   Drug use: No   Sexual activity: Not on file  Other Topics Concern   Not on file  Social History Narrative   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   Married - at least 1 daughter.   Never Smoked   Usually very active, but no exercise routine.   Right handed    Social Determinants of Health   Financial Resource Strain: Not on file  Food Insecurity: Not on file  Transportation Needs: Not on file  Physical Activity: Not on file  Stress: Not on file  Social Connections: Not on file  Intimate Partner Violence: Not on file     PHYSICAL EXAM: Vitals:   09/24/20 0952  BP: 122/65  Pulse: (!) 54  SpO2: 97%   General: No acute distress Head:  Normocephalic/atraumatic Skin/Extremities: No rash, no edema Neurological Exam: alert and awake. No aphasia or dysarthria. Fund of knowledge is appropriate.  Attention and concentration are normal.   Cranial nerves: Pupils equal, round. Extraocular movements intact with no nystagmus. Visual fields full.  No facial asymmetry.  Motor: Bulk and tone normal, muscle strength 5/5 throughout with no pronator drift.   Finger to nose testing intact.  Gait slightly wide-based, no ataxia.    IMPRESSION: This is a 80 yo RH man with a history of hypertension, hyperlipidemia, diabetes, CAD, CKD, bradycardia, anxiety, depression, with recent repeat Neuropsychological evaluation indicating transition to Major Neurocognitive Disorder (dementia), likely mixed vascular and AD. Findings discussed with patient and family today. He  has been on Donepezil '10mg'$  daily, we discussed adding on Memantine. Family also reports more anxiety and would like to start first with increasing Lexapro to '20mg'$  daily. They will update [place] in a month, if no issues with increase in Lexapro, we will start Memantine '10mg'$  qhs x 2 weeks, then increase to '10mg'$  BID. Side effects and expectations from medications were discussed. We again discussed the importance of control of vascular risk factors, physical exercise, brain stimulation exercises, and MIND diet for overall brain health. Discussed no further driving alone, continue to monitor. Follow-up in 6 months, they know to call for any changes.    Thank you for allowing me to participate in his care.  Please do not hesitate to call for any questions or concerns.    Ellouise Newer, M.D.   CC: Dr. Netty Starring

## 2020-09-29 NOTE — Telephone Encounter (Signed)
Was able to speak with pt's wife regarding recent labs and his potassium, she reports Mr. Pavao is not taking any potassium supplements. Educated on foods rich in potassium pt should avoid. Wife reports understanding.  [name] understands her husband will need to come back for repeat labs, she is currently having finger surgery tomorrow so her daughter will need to bring Mr. Kulas in for his repeat labs. She will have her daughter call the office to schedule a date and time to bring Mr. Darnley as she needs to work around her work schedule.   Otherwise all questions or concerns were address and no additional concerns at this time. Agreeable to plan, will call back for lab appt.

## 2020-09-29 NOTE — Telephone Encounter (Signed)
Patient returning call.

## 2020-09-29 NOTE — Telephone Encounter (Signed)
Attempted to schedule labs .  Wife wants to check with daughter about bringing him .  Please call to discuss results.

## 2020-09-29 NOTE — Telephone Encounter (Signed)
Attempte x2 to reach out to pt [name] for lab results  Kidney function is stable.  Potassium is elevated at 5.4.  Cont with plan for lasix as needed only.  Ensure that he is not taking any potassium supplements.  Remain off of losartan.  Avoid high potassium foods.  He should have a f/u bmet on Monday to ensure stability. -Ignacia Bayley, NP  Order for [facility] placed Pt will need to have this schedule in office when spoken too.

## 2020-10-04 ENCOUNTER — Other Ambulatory Visit: Payer: Self-pay

## 2020-10-04 ENCOUNTER — Other Ambulatory Visit (INDEPENDENT_AMBULATORY_CARE_PROVIDER_SITE_OTHER): Payer: HMO

## 2020-10-04 DIAGNOSIS — E875 Hyperkalemia: Secondary | ICD-10-CM

## 2020-10-05 LAB — BASIC METABOLIC PANEL
BUN/Creatinine Ratio: 18 (ref 10–24)
BUN: 27 mg/dL (ref 8–27)
CO2: 23 mmol/L (ref 20–29)
Calcium: 9 mg/dL (ref 8.6–10.2)
Chloride: 103 mmol/L (ref 96–106)
Creatinine, Ser: 1.5 mg/dL — ABNORMAL HIGH (ref 0.76–1.27)
Glucose: 269 mg/dL — ABNORMAL HIGH (ref 65–99)
Potassium: 5.3 mmol/L — ABNORMAL HIGH (ref 3.5–5.2)
Sodium: 139 mmol/L (ref 134–144)
eGFR: 47 mL/min/{1.73_m2} — ABNORMAL LOW (ref >59)

## 2020-10-13 NOTE — Telephone Encounter (Signed)
See telephone encounter and result notes. Lab results and recommendations reviewed with patients wife per release form and confirmed upcoming appointment.

## 2020-10-29 ENCOUNTER — Other Ambulatory Visit: Payer: Self-pay | Admitting: Cardiovascular Disease

## 2020-11-02 ENCOUNTER — Ambulatory Visit: Payer: HMO | Admitting: Cardiovascular Disease

## 2020-11-02 ENCOUNTER — Other Ambulatory Visit: Payer: Self-pay

## 2020-11-02 ENCOUNTER — Encounter: Payer: Self-pay | Admitting: Cardiovascular Disease

## 2020-11-02 VITALS — BP 132/64 | HR 53 | Ht 62.0 in | Wt 202.0 lb

## 2020-11-02 DIAGNOSIS — R0609 Other forms of dyspnea: Secondary | ICD-10-CM | POA: Diagnosis not present

## 2020-11-02 DIAGNOSIS — E785 Hyperlipidemia, unspecified: Secondary | ICD-10-CM

## 2020-11-02 DIAGNOSIS — I25118 Atherosclerotic heart disease of native coronary artery with other forms of angina pectoris: Secondary | ICD-10-CM

## 2020-11-02 DIAGNOSIS — I951 Orthostatic hypotension: Secondary | ICD-10-CM | POA: Diagnosis not present

## 2020-11-02 DIAGNOSIS — I10 Essential (primary) hypertension: Secondary | ICD-10-CM | POA: Diagnosis not present

## 2020-11-02 DIAGNOSIS — I5032 Chronic diastolic (congestive) heart failure: Secondary | ICD-10-CM | POA: Diagnosis not present

## 2020-11-02 DIAGNOSIS — N183 Chronic kidney disease, stage 3 unspecified: Secondary | ICD-10-CM | POA: Diagnosis not present

## 2020-11-02 MED ORDER — ISOSORBIDE MONONITRATE ER 30 MG PO TB24: 30.0000 mg | ORAL_TABLET | Freq: Every day | ORAL | 3 refills | Status: DC

## 2020-11-02 NOTE — Progress Notes (Signed)
Cardiology Office Note  Date:  11/02/2020   ID:  Ronald Wallace, DOB 07/24/1940, MRN [id number]  PCP:  [name], MD   Chief Complaint  Patient presents with   12 month follow up     Patient c/o shortness of breath when lying flat and is dizzy with walking. Medications reviewed by the patient verbally.     HPI:  [name] is a 80 y/o male with a prior h/o  syncope,  HTN,  HL,   DM, remote smoking, pipe, , no cigarettes increasing dyspnea and underwent stress testing @ [facility], which was abnl,   CAD cath in late June 2017 revealing moderate LAD and LCX/OM dzs with a total occlusion of the prox RCA,  L  R collaterals.   LV fxn was nl,  who presents for routine follow-up of his coronary artery disease,  chronic shortness of breath  In follow-up today, reports his sleep is poor, has chronic fatigue  Sedentary, no exercise, deconditioned Poor diet, A1c running high  Prior medication changes Off potassium and losartan for high potassium  Myoview 05/2020, reviewed on discussion today Normal pharmacologic myocardial perfusion stress test without significant ischemia or scar. The left ventricular ejection fraction is normal by visual estimation (LVEF 67% by Siemens calculation, 48% by QGS). Coronary artery calcification and aortic atherosclerosis are noted on the attenuation correction CT. This is a low risk study.   Lab work reviewed HBA1C 8.1 TSH 6.5 HCT 11.2 CR 1.5 total chol 111, LDL 59  EKG personally reviewed by myself on todays visit Shows Normal sinus rhythm with rate 53 bpm no significant ST or T-wave changes  Previous cardiac catheterization results from November 2019  Catheterization performed for shortness of breath  right and left heart cath   Right heart pressures were within normal range RA: mean 11, RV 39/10/15, PA: 31/16/22, Wedge: 11  Mild to moderate mid LAD disease, mild proximal and mid LCX disease, Occluded proximal RCA  with left to right collaterals Normal right heart pressures Prox LAD to Mid LAD lesion is 45% stenosed. 2nd Mrg lesion is 30% stenosed. Ost Cx to Prox Cx lesion is 30% stenosed. Prox RCA to Dist RCA lesion is 100% stenosed.      other past medical history reviewed  catheterization  June 2017 discussed with him, 30% LAD disease, occluded RCA with collaterals  02/2014 Carotid Dopplers: 1-39% bilateral ICA with normal Subclavian & vertebral arteries; b. 02/2014 2 D Echo: EF 50-55%, Gr 1 DD, Mild MR & LA dilation -- was bradycardic in 40s  02/2014 Neg Ex MV, EF 65%;  b. 06/2015 Abnl MV ([facility])-->Cath ([facility]): LM nl, LAD 30p/m, LCX 30p, OM2 50p, RCA 100p w/ L-->R collats, nl EF-->Med Rx.    PMH:   has a past medical history of Anemia of chronic disease (07/01/2015), Arthritis, Benign prostatic hyperplasia with nocturia (10/30/2016), Bradycardia (03/12/2014), Bruit of left carotid artery (03/12/2014), CKD (chronic kidney disease) (09/27/2015), Coronary artery disease involving native coronary artery of native heart without angina pectoris, Diabetes mellitus, type II, insulin dependent, Diabetic neuropathy, Diabetic retinopathy associated with type 2 diabetes mellitus (10/28/2018), Disorder of bone and cartilage, Diverticulitis, DOE (dyspnea on exertion) (03/12/2014), Generalized anxiety disorder, GERD (gastroesophageal reflux disease), Hypertension associated with diabetes, Major depressive disorder, Major neurocognitive disorder due to multiple etiologies (08/04/2020), Mixed hyperlipidemia due to type 2 diabetes mellitus (02/06/2017), Obesity, Osteoporosis, Pure hypercholesterolemia, and Syncope and collapse.  PSH:    Past Surgical History:  Procedure Laterality Date  CARDIAC CATHETERIZATION Left 07/07/2015   Procedure: Left Heart Cath and Coronary Angiography;  Surgeon: Isaias Cowman, MD;  Location: [place] [facility];  Service: Cardiovascular;  Laterality: Left;   CARPAL TUNNEL RELEASE      CATARACT EXTRACTION Right    CATARACT EXTRACTION W/PHACO Left 10/06/2015   Procedure: CATARACT EXTRACTION PHACO AND INTRAOCULAR LENS PLACEMENT ([place]);  Surgeon: Estill Cotta, MD;  Location: [facility];  Service: Ophthalmology;  Laterality: Left;  [place] 01:42 AP% 25.1 CDE 45.92 Fluid pack lot # [id number]   COLONOSCOPY     COLONOSCOPY WITH PROPOFOL N/A 12/04/2018   Procedure: COLONOSCOPY WITH PROPOFOL;  Surgeon: Toledo, Benay Pike, MD;  Location: [facility];  Service: Gastroenterology;  Laterality: N/A;   NM MYOVIEW LTD  03/13/2014   LOW RISK - positive EKG but negative imaging: 2 mm ST Depression in V4 & V5 but No scintigraphic evidence of Ischemia or Infarct o   RIGHT/LEFT HEART CATH AND CORONARY ANGIOGRAPHY N/A 11/21/2017   Procedure: RIGHT/LEFT HEART CATH AND CORONARY ANGIOGRAPHY;  Surgeon: Minna Merritts, MD;  Location: [place] [facility];  Service: Cardiovascular;  Laterality: N/A;   TRANSTHORACIC ECHOCARDIOGRAM  03/12/2014   EF 50-55%, Gr 1 DD, no RWMA, Mlid MR & LA dil, Normla RV and bradycardic 40s -    Current Outpatient Medications  Medication Sig Dispense Refill   acetaminophen (TYLENOL) 325 MG tablet Take 650 mg by mouth every 6 (six) hours as needed.     aspirin EC 81 MG tablet Take 81 mg by mouth daily.     calcium carbonate (OS-CAL) 600 MG TABS tablet Take 600 mg by mouth daily with breakfast.     cholecalciferol (VITAMIN D) 1000 UNITS tablet Take 2,000 Units by mouth daily.      donepezil (ARICEPT) 10 MG tablet Take 1 tablet (10 mg total) by mouth daily. 90 tablet 3   escitalopram (LEXAPRO) 20 MG tablet Take 1 tablet (20 mg total) by mouth daily. 90 tablet 3   ferrous sulfate 325 (65 FE) MG tablet Take 325 mg by mouth daily with breakfast.     furosemide (LASIX) 20 MG tablet Take 1 tablet (20 mg) by mouth once daily as needed for swelling/ weight gain/ shortness of breath 90 tablet 3   gabapentin (NEURONTIN) 300 MG capsule Take 300-600 mg by mouth See admin  instructions. Take 1 capsule (300 mg) by mouth in the morning & 2 capsules (600 mg) by mouth every night.     isosorbide mononitrate (IMDUR) 30 MG 24 hr tablet Take 1 tablet by mouth once daily 90 tablet 0   LEVEMIR FLEXTOUCH 100 UNIT/ML FlexPen Inject 25 Units into the skin at bedtime.     levothyroxine (SYNTHROID) 50 MCG tablet Take 50 mcg by mouth daily before breakfast.     metFORMIN (GLUCOPHAGE) 500 MG tablet Take 250 mg by mouth 2 (two) times daily with a meal.     NOVOLOG 100 UNIT/ML injection Inject 20-38 Units into the skin 3 (three) times daily with meals. Sliding scale insulin base rates= 20 units in the morning, 26 units with lunch, 26 units with supper.     simvastatin (ZOCOR) 20 MG tablet Take 20 mg by mouth daily.     tamsulosin (FLOMAX) 0.4 MG CAPS capsule Take 0.4 mg by mouth daily. Takes 2 each morning.     No current facility-administered medications for this visit.     Allergies:   Amlodipine   Social History:  The patient  reports that he  quit smoking about 57 years ago. His smoking use included pipe. He has never used smokeless tobacco. He reports that he does not drink alcohol and does not use drugs.   Family History:   family history includes Breast cancer in his mother; Heart attack in his mother; Lung cancer in his father.    Review of Systems: Review of Systems  Constitutional: Negative.   Respiratory:  Positive for shortness of breath.   Cardiovascular: Negative.   Gastrointestinal: Negative.   Musculoskeletal: Negative.   Neurological: Negative.   Psychiatric/Behavioral: Negative.    All other systems reviewed and are negative.  PHYSICAL EXAM: VS:  BP 132/64 (BP Location: Left Arm, Patient Position: Sitting, Cuff Size: Normal)   Pulse (!) 53   Ht 5\' 2"  (1.575 m)   Wt 202 lb (91.6 kg)   SpO2 98%   BMI 36.95 kg/m  , BMI Body mass index is 36.95 kg/m.  Constitutional:  oriented to person, place, and time. No distress.  HENT:  Head: Grossly  normal Eyes:  no discharge. No scleral icterus.  Neck: No JVD, no carotid bruits  Cardiovascular: Regular rate and rhythm, no murmurs appreciated Pulmonary/Chest: Clear to auscultation bilaterally, no wheezes or rails Abdominal: Soft.  no distension.  no tenderness.  Musculoskeletal: Normal range of motion Neurological:  normal muscle tone. Coordination normal. No atrophy Skin: Skin warm and dry Psychiatric: normal affect, pleasant  Recent Labs: 10/04/2020: BUN 27; Creatinine, Ser 1.50; Potassium 5.3; Sodium 139    Lipid Panel No results found for: CHOL, HDL, LDLCALC, TRIG    Wt Readings from Last 3 Encounters:  11/02/20 202 lb (91.6 kg)  09/24/20 203 lb (92.1 kg)  09/16/20 200 lb 4 oz (90.8 kg)      ASSESSMENT AND PLAN:  Shortness of breath catheterization November 2019 showing no significant change in his disease RCA occluded with collaterals from left to right -Underwent stress test May 2022 with no ischemia = We have recommended weight loss, walking program for conditioning  Essential hypertension -  Orthostatics checked today, no significant drop in blood pressure Heart rates in the 50s, blood pressures 453 systolic over 60 No medication changes made  Mixed hyperlipidemia -  Cholesterol is at goal on the current lipid regimen. No changes to the medications were made.  Coronary artery disease of native artery of native heart with stable angina pectoris ([place]) -  Occluded RCA with collaterals from left to right by catheterization June 2017 Same findings on catheterization November 2017 Diabetes poorly controlled, A1c 8.1, cholesterol at goal -Stress testing no ischemia  Diabetes mellitus, type II, insulin dependent ([place]) - Plan: EKG 12-Lead Very sedentary, poor diet A1c 8.1 Weight loss walking program recommended  DOE (dyspnea on exertion) -  Chronic shortness of breath Stress test no ischemia Recommended regular walking program He is sedentary,  deconditioned  Chronic renal impairment, stage 3 (moderate)  Lasix as needed for leg swelling No leg swelling on today's visit   Total encounter time more than 25 minutes  Greater than 50% was spent in counseling and coordination of care with the patient    No orders of the defined types were placed in this encounter.    Signed, Esmond Plants, M.D., Ph.D. 11/02/2020  [place], [place]

## 2020-11-02 NOTE — Patient Instructions (Addendum)
Medication Instructions:  No changes  If you need a refill on your cardiac medications before your next appointment, please call your pharmacy.   Lab work: No new labs needed  Testing/Procedures: No new testing needed  Follow-Up: At [facility], you and your health needs are our priority.  As part of our continuing mission to provide you with exceptional heart care, we have created designated Provider Care Teams.  These Care Teams include your primary Cardiologist (physician) and Advanced Practice Providers (APPs -  Physician Assistants and Nurse Practitioners) who all work together to provide you with the care you need, when you need it.  You will need a follow up appointment in 12 months  Providers on your designated Care Team:   Christopher Berge, NP Ryan Dunn, PA-C Jacquelyn Visser, PA-C Cadence Furth, PA-C  COVID-19 Vaccine Information can be found at: [email or web address] For questions related to vaccine distribution or appointments, please email [email or web address] or call [phone].    

## 2020-11-23 DIAGNOSIS — E1142 Type 2 diabetes mellitus with diabetic polyneuropathy: Secondary | ICD-10-CM | POA: Diagnosis not present

## 2020-11-23 DIAGNOSIS — I152 Hypertension secondary to endocrine disorders: Secondary | ICD-10-CM | POA: Diagnosis not present

## 2020-11-23 DIAGNOSIS — M5136 Other intervertebral disc degeneration, lumbar region: Secondary | ICD-10-CM | POA: Diagnosis not present

## 2020-11-23 DIAGNOSIS — Z794 Long term (current) use of insulin: Secondary | ICD-10-CM | POA: Diagnosis not present

## 2020-11-23 DIAGNOSIS — E1159 Type 2 diabetes mellitus with other circulatory complications: Secondary | ICD-10-CM | POA: Diagnosis not present

## 2020-11-23 DIAGNOSIS — M62830 Muscle spasm of back: Secondary | ICD-10-CM | POA: Diagnosis not present

## 2020-11-23 DIAGNOSIS — E039 Hypothyroidism, unspecified: Secondary | ICD-10-CM | POA: Diagnosis not present

## 2020-11-23 DIAGNOSIS — E785 Hyperlipidemia, unspecified: Secondary | ICD-10-CM | POA: Diagnosis not present

## 2020-11-23 DIAGNOSIS — M5416 Radiculopathy, lumbar region: Secondary | ICD-10-CM | POA: Diagnosis not present

## 2020-11-23 DIAGNOSIS — E1169 Type 2 diabetes mellitus with other specified complication: Secondary | ICD-10-CM | POA: Diagnosis not present

## 2020-11-29 DIAGNOSIS — M6281 Muscle weakness (generalized): Secondary | ICD-10-CM | POA: Diagnosis not present

## 2020-11-29 DIAGNOSIS — M25652 Stiffness of left hip, not elsewhere classified: Secondary | ICD-10-CM | POA: Diagnosis not present

## 2020-11-29 DIAGNOSIS — M25552 Pain in left hip: Secondary | ICD-10-CM | POA: Diagnosis not present

## 2020-11-29 DIAGNOSIS — M5416 Radiculopathy, lumbar region: Secondary | ICD-10-CM | POA: Diagnosis not present

## 2020-12-02 DIAGNOSIS — M25652 Stiffness of left hip, not elsewhere classified: Secondary | ICD-10-CM | POA: Diagnosis not present

## 2020-12-02 DIAGNOSIS — M6281 Muscle weakness (generalized): Secondary | ICD-10-CM | POA: Diagnosis not present

## 2020-12-02 DIAGNOSIS — M5416 Radiculopathy, lumbar region: Secondary | ICD-10-CM | POA: Diagnosis not present

## 2020-12-02 DIAGNOSIS — M25552 Pain in left hip: Secondary | ICD-10-CM | POA: Diagnosis not present

## 2020-12-05 DIAGNOSIS — M5416 Radiculopathy, lumbar region: Secondary | ICD-10-CM | POA: Diagnosis not present

## 2020-12-05 DIAGNOSIS — M25552 Pain in left hip: Secondary | ICD-10-CM | POA: Diagnosis not present

## 2020-12-05 DIAGNOSIS — M6281 Muscle weakness (generalized): Secondary | ICD-10-CM | POA: Diagnosis not present

## 2020-12-05 DIAGNOSIS — M25652 Stiffness of left hip, not elsewhere classified: Secondary | ICD-10-CM | POA: Diagnosis not present

## 2020-12-07 DIAGNOSIS — E1169 Type 2 diabetes mellitus with other specified complication: Secondary | ICD-10-CM | POA: Diagnosis not present

## 2020-12-07 DIAGNOSIS — E785 Hyperlipidemia, unspecified: Secondary | ICD-10-CM | POA: Diagnosis not present

## 2020-12-07 DIAGNOSIS — D638 Anemia in other chronic diseases classified elsewhere: Secondary | ICD-10-CM | POA: Diagnosis not present

## 2020-12-08 DIAGNOSIS — M25652 Stiffness of left hip, not elsewhere classified: Secondary | ICD-10-CM | POA: Diagnosis not present

## 2020-12-08 DIAGNOSIS — M25552 Pain in left hip: Secondary | ICD-10-CM | POA: Diagnosis not present

## 2020-12-08 DIAGNOSIS — M5416 Radiculopathy, lumbar region: Secondary | ICD-10-CM | POA: Diagnosis not present

## 2020-12-08 DIAGNOSIS — M6281 Muscle weakness (generalized): Secondary | ICD-10-CM | POA: Diagnosis not present

## 2020-12-14 DIAGNOSIS — F419 Anxiety disorder, unspecified: Secondary | ICD-10-CM | POA: Diagnosis not present

## 2020-12-14 DIAGNOSIS — E785 Hyperlipidemia, unspecified: Secondary | ICD-10-CM | POA: Diagnosis not present

## 2020-12-14 DIAGNOSIS — N1831 Chronic kidney disease, stage 3a: Secondary | ICD-10-CM | POA: Diagnosis not present

## 2020-12-14 DIAGNOSIS — D638 Anemia in other chronic diseases classified elsewhere: Secondary | ICD-10-CM | POA: Diagnosis not present

## 2020-12-14 DIAGNOSIS — I152 Hypertension secondary to endocrine disorders: Secondary | ICD-10-CM | POA: Diagnosis not present

## 2020-12-14 DIAGNOSIS — M25551 Pain in right hip: Secondary | ICD-10-CM | POA: Diagnosis not present

## 2020-12-14 DIAGNOSIS — M25552 Pain in left hip: Secondary | ICD-10-CM | POA: Diagnosis not present

## 2020-12-14 DIAGNOSIS — E875 Hyperkalemia: Secondary | ICD-10-CM | POA: Diagnosis not present

## 2020-12-14 DIAGNOSIS — E1169 Type 2 diabetes mellitus with other specified complication: Secondary | ICD-10-CM | POA: Diagnosis not present

## 2020-12-14 DIAGNOSIS — E669 Obesity, unspecified: Secondary | ICD-10-CM | POA: Diagnosis not present

## 2020-12-14 DIAGNOSIS — E1159 Type 2 diabetes mellitus with other circulatory complications: Secondary | ICD-10-CM | POA: Diagnosis not present

## 2020-12-14 DIAGNOSIS — R4189 Other symptoms and signs involving cognitive functions and awareness: Secondary | ICD-10-CM | POA: Diagnosis not present

## 2020-12-14 DIAGNOSIS — F32A Depression, unspecified: Secondary | ICD-10-CM | POA: Diagnosis not present

## 2020-12-16 DIAGNOSIS — M5416 Radiculopathy, lumbar region: Secondary | ICD-10-CM | POA: Diagnosis not present

## 2020-12-16 DIAGNOSIS — M48062 Spinal stenosis, lumbar region with neurogenic claudication: Secondary | ICD-10-CM | POA: Diagnosis not present

## 2021-01-05 DIAGNOSIS — M5416 Radiculopathy, lumbar region: Secondary | ICD-10-CM | POA: Diagnosis not present

## 2021-01-05 DIAGNOSIS — M62830 Muscle spasm of back: Secondary | ICD-10-CM | POA: Diagnosis not present

## 2021-01-05 DIAGNOSIS — M48062 Spinal stenosis, lumbar region with neurogenic claudication: Secondary | ICD-10-CM | POA: Diagnosis not present

## 2021-01-05 DIAGNOSIS — M5136 Other intervertebral disc degeneration, lumbar region: Secondary | ICD-10-CM | POA: Diagnosis not present

## 2021-01-18 DIAGNOSIS — E039 Hypothyroidism, unspecified: Secondary | ICD-10-CM | POA: Diagnosis not present

## 2021-01-18 DIAGNOSIS — E1142 Type 2 diabetes mellitus with diabetic polyneuropathy: Secondary | ICD-10-CM | POA: Diagnosis not present

## 2021-01-18 DIAGNOSIS — E785 Hyperlipidemia, unspecified: Secondary | ICD-10-CM | POA: Diagnosis not present

## 2021-01-18 DIAGNOSIS — E1159 Type 2 diabetes mellitus with other circulatory complications: Secondary | ICD-10-CM | POA: Diagnosis not present

## 2021-01-18 DIAGNOSIS — Z794 Long term (current) use of insulin: Secondary | ICD-10-CM | POA: Diagnosis not present

## 2021-01-18 DIAGNOSIS — E1169 Type 2 diabetes mellitus with other specified complication: Secondary | ICD-10-CM | POA: Diagnosis not present

## 2021-01-18 DIAGNOSIS — I152 Hypertension secondary to endocrine disorders: Secondary | ICD-10-CM | POA: Diagnosis not present

## 2021-03-25 ENCOUNTER — Other Ambulatory Visit: Payer: Self-pay

## 2021-03-25 ENCOUNTER — Encounter: Payer: Self-pay | Admitting: Physician Assistant

## 2021-03-25 ENCOUNTER — Ambulatory Visit: Payer: HMO | Admitting: Physician Assistant

## 2021-03-25 VITALS — BP 110/58 | HR 74 | Resp 18 | Ht 68.0 in | Wt 195.0 lb

## 2021-03-25 DIAGNOSIS — F01518 Vascular dementia, unspecified severity, with other behavioral disturbance: Secondary | ICD-10-CM

## 2021-03-25 DIAGNOSIS — G309 Alzheimer's disease, unspecified: Secondary | ICD-10-CM

## 2021-03-25 DIAGNOSIS — F02818 Dementia in other diseases classified elsewhere, unspecified severity, with other behavioral disturbance: Secondary | ICD-10-CM

## 2021-03-25 MED ORDER — MEMANTINE HCL 5 MG PO TABS: ORAL_TABLET | ORAL | 1 refills | Status: DC

## 2021-03-25 NOTE — Patient Instructions (Addendum)
Continue Lexapro (escitalopram  '20mg'$  daily  2. Start Memantine 5 mg tablets. Take 1 tablet at bedtime for 2 weeks, then 1 tablet twice daily.   Side effects include dizziness, headache, diarrhea or constipation.  Call if severe symptoms appear   3. Continue Donepezil '10mg'$  daily  4. Continue to monitor driving, have someone with you at all times or family to drive.   5. Follow-up in 6 months, call for any changes   FALL PRECAUTIONS: Be cautious when walking. Scan the area for obstacles that may increase the risk of trips and falls. When getting up in the mornings, sit up at the edge of the bed for a few minutes before getting out of bed. Consider elevating the bed at the head end to avoid drop of blood pressure when getting up. Walk always in a well-lit room (use night lights in the walls). Avoid area rugs or power cords from appliances in the middle of the walkways. Use a walker or a cane if necessary and consider physical therapy for balance exercise. Get your eyesight checked regularly.  FINANCIAL OVERSIGHT: Supervision, especially oversight when making financial decisions or transactions is also recommended.  HOME SAFETY: Consider the safety of the kitchen when operating appliances like stoves, microwave oven, and blender. Consider having supervision and share cooking responsibilities until no longer able to participate in those. Accidents with firearms and other hazards in the house should be identified and addressed as well.  DRIVING: Regarding driving, in patients with progressive memory problems, driving will be impaired. We advise to have someone else do the driving if trouble finding directions or if minor accidents are reported. Independent driving assessment is available to determine safety of driving.  ABILITY TO BE LEFT ALONE: If patient is unable to contact 911 operator, consider using LifeLine, or when the need is there, arrange for someone to stay with patients. Smoking is a fire  hazard, consider supervision or cessation. Risk of wandering should be assessed by caregiver and if detected at any point, supervision and safe proof recommendations should be instituted.  MEDICATION SUPERVISION: Inability to self-administer medication needs to be constantly addressed. Implement a mechanism to ensure safe administration of the medications.  RECOMMENDATIONS FOR ALL PATIENTS WITH MEMORY PROBLEMS: 1. Continue to exercise (Recommend 30 minutes of walking everyday, or 3 hours every week) 2. Increase social interactions - continue going to [place] and enjoy social gatherings with friends and family 3. Eat healthy, avoid fried foods and eat more fruits and vegetables 4. Maintain adequate blood pressure, blood sugar, and blood cholesterol level. Reducing the risk of stroke and cardiovascular disease also helps promoting better memory. 5. Avoid stressful situations. Live a simple life and avoid aggravations. Organize your time and prepare for the next day in anticipation. 6. Sleep well, avoid any interruptions of sleep and avoid any distractions in the bedroom that may interfere with adequate sleep quality 7. Avoid sugar, avoid sweets as there is a strong link between excessive sugar intake, diabetes, and cognitive impairment We discussed the Mediterranean diet, which has been shown to help patients reduce the risk of progressive memory disorders and reduces cardiovascular risk. This includes eating fish, eat fruits and green leafy vegetables, nuts like almonds and hazelnuts, walnuts, and also use olive oil. Avoid fast foods and fried foods as much as possible. Avoid sweets and sugar as sugar use has been linked to worsening of memory function.  There is always a concern of gradual progression of memory problems. If this is the  case, then we may need to adjust level of care according to patient needs. Support, both to the patient and caregiver, should then be put into  place.       Mediterranean Diet  Why follow it? Research shows Those who follow the Mediterranean diet have a reduced risk of heart disease  The diet is associated with a reduced incidence of Parkinson's and Alzheimer's diseases People following the diet may have longer life expectancies and lower rates of chronic diseases  The Dietary Guidelines for Americans recommends the Mediterranean diet as an eating plan to promote health and prevent disease  What Is the Mediterranean Diet?  Healthy eating plan based on typical foods and recipes of Mediterranean-style cooking The diet is primarily a plant based diet; these foods should make up a majority of meals   Starches - Plant based foods should make up a majority of meals - They are an important sources of vitamins, minerals, energy, antioxidants, and fiber - Choose whole grains, foods high in fiber and minimally processed items  - Typical grain sources include wheat, oats, barley, corn, brown rice, bulgar, farro, millet, polenta, couscous  - Various types of beans include chickpeas, lentils, fava beans, black beans, white beans   Fruits  Veggies - Large quantities of antioxidant rich fruits & veggies; 6 or more servings  - Vegetables can be eaten raw or lightly drizzled with oil and cooked  - Vegetables common to the traditional Mediterranean Diet include: artichokes, arugula, beets, broccoli, brussel sprouts, cabbage, carrots, celery, collard greens, cucumbers, eggplant, kale, leeks, lemons, lettuce, mushrooms, okra, onions, peas, peppers, potatoes, pumpkin, radishes, rutabaga, shallots, spinach, sweet potatoes, turnips, zucchini - Fruits common to the Mediterranean Diet include: apples, apricots, avocados, cherries, clementines, dates, figs, grapefruits, grapes, melons, nectarines, oranges, peaches, pears, pomegranates, strawberries, tangerines  Fats - Replace butter and margarine with healthy oils, such as olive oil, canola oil, and tahini   - Limit nuts to no more than a handful a day  - Nuts include walnuts, almonds, pecans, pistachios, pine nuts  - Limit or avoid candied, honey roasted or heavily salted nuts - Olives are central to the [employer] - can be eaten whole or used in a variety of dishes   Meats Protein - Limiting red meat: no more than a few times a month - When eating red meat: choose lean cuts and keep the portion to the size of deck of cards - Eggs: approx. 0 to 4 times a week  - Fish and lean poultry: at least 2 a week  - Healthy protein sources include, chicken, [place], lean beef, lamb - Increase intake of seafood such as tuna, salmon, trout, mackerel, shrimp, scallops - Avoid or limit high fat processed meats such as sausage and bacon  Dairy - Include moderate amounts of low fat dairy products  - Focus on healthy dairy such as fat free yogurt, skim milk, low or reduced fat cheese - Limit dairy products higher in fat such as whole or 2% milk, cheese, ice cream  Alcohol - Moderate amounts of red wine is ok  - No more than 5 oz daily for women (all ages) and men older than age 36  - No more than 10 oz of wine daily for men younger than 1  Other - Limit sweets and other desserts  - Use herbs and spices instead of salt to flavor foods  - Herbs and spices common to the traditional Mediterranean Diet include: basil, bay leaves, chives, cloves,  cumin, fennel, garlic, lavender, marjoram, mint, oregano, parsley, pepper, rosemary, sage, savory, sumac, tarragon, thyme   Its not just a diet, its a lifestyle:  The Mediterranean diet includes lifestyle factors typical of those in the region  Foods, drinks and meals are best eaten with others and savored Daily physical activity is important for overall good health This could be strenuous exercise like running and aerobics This could also be more leisurely activities such as walking, housework, yard-work, or taking the stairs Moderation is the key; a balanced  and healthy diet accommodates most foods and drinks Consider portion sizes and frequency of consumption of certain foods   Meal Ideas & Options:  Breakfast:  Whole wheat toast or whole wheat English muffins with peanut butter & hard boiled egg Steel cut oats topped with apples & cinnamon and skim milk  Fresh fruit: banana, strawberries, melon, berries, peaches  Smoothies: strawberries, bananas, greek yogurt, peanut butter Low fat greek yogurt with blueberries and granola  Egg white omelet with spinach and mushrooms Breakfast couscous: whole wheat couscous, apricots, skim milk, cranberries  Sandwiches:  Hummus and grilled vegetables (peppers, zucchini, squash) on whole wheat bread   Grilled chicken on whole wheat pita with lettuce, tomatoes, cucumbers or tzatziki  [place] salad on whole wheat bread: tuna salad made with greek yogurt, olives, red peppers, capers, green onions Garlic rosemary lamb pita: lamb sauted with garlic, rosemary, salt & pepper; add lettuce, cucumber, greek yogurt to pita - flavor with lemon juice and black pepper  Seafood:  Mediterranean grilled salmon, seasoned with garlic, basil, parsley, lemon juice and black pepper Shrimp, lemon, and spinach whole-grain pasta salad made with low fat greek yogurt  Seared scallops with lemon orzo  Seared tuna steaks seasoned salt, pepper, coriander topped with tomato mixture of olives, tomatoes, olive oil, minced garlic, parsley, green onions and cappers  Meats:  Herbed greek chicken salad with kalamata olives, cucumber, feta  Red bell peppers stuffed with spinach, bulgur, lean ground beef (or lentils) & topped with feta   Kebabs: skewers of chicken, tomatoes, onions, zucchini, squash  [place] burgers: made with red onions, mint, dill, lemon juice, feta cheese topped with roasted red peppers Vegetarian Cucumber salad: cucumbers, artichoke hearts, celery, red onion, feta cheese, tossed in olive oil & lemon juice  Hummus and whole  grain pita points with a greek salad (lettuce, tomato, feta, olives, cucumbers, red onion) Lentil soup with celery, carrots made with vegetable broth, garlic, salt and pepper  Tabouli salad: parsley, bulgur, mint, scallions, cucumbers, tomato, radishes, lemon juice, olive oil, salt and pepper.

## 2021-03-25 NOTE — Progress Notes (Signed)
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  Orientation 5 out of 5.  Object naming 2/2.  He is on donepezil 10 mg daily.  He is also on Lexapro 20 mg daily, tolerating well.   Recommendations:  Discussed safety both in and out of the home.  Discussed the importance of regular daily schedule with inclusion of crossword puzzles to maintain brain function.  Continue to monitor mood by PCP Stay active at least 30 minutes at least 3 times a week.  Naps should be scheduled and should be no longer than 60 minutes and should not occur after 2 PM.  Mediterranean diet is recommended  Control cardiovascular risk factors  Continue donepezil 10 mg daily Side effects were discussed  Start Memantine 10 mg: Take 1 tablet (10 mg at night) for 2 weeks, then increase to 1 tablet (10 mg) twice a day.  Side effects discussed  Follow up in 6  months.   Case discussed with Dr. Delice Lesch who agrees with the plan      Subjective:    Ronald Wallace is a very pleasant 81 y.o. RH male with a history of hypertension, hyperlipidemia, diabetes, CAD, CKD, bradycardia, anxiety, depression, with a diagnosis of Major Neurocognitive Disorder (dementia), likely mixed vascular and AD seen today in follow up for memory loss. This patient is accompanied in the office by his daughter who supplements the history.  Previous records as well as any outside records available were reviewed prior to todays visit.  Patient was last seen at our office on 09/24/2020.  He is currently on Donepezil 10 mg daily and on Lexapro 20 mg daily  for depression, tolerating well.  He has had some good days and bad days as described by him.  His main concern is his short-term memory, may be "a little worse ".  He has been taking care of his wife who had a fracture in her arm, requiring surgery, and that has been brought some mood changes  in him, which are intermittent.  "She needs more attention, I worry about her, and I lost my temper the other day ".  "I feel bad about it ". He denies any significant issues with long-term memory, confusion, hallucinations or paranoia.  He sleeps well, denies any REM behavior or with walking.  During the day, he likes to watch TV, especially the game programs.  He likes to watch showed that is "at 10 PM, and it helps with my memory ".  He continues to manage his medications, but his daughter helps to monitor.  He occasionally drives, only if necessary, otherwise his family will do so.  He has lost interest in driving.  His wife and daughter manages finances.  He has occasional headaches, which are chronic, and there is no change in pattern.  Occasionally he has nasal, but since taking of the fluid pill, this has improved.  There are no falls.  He did not develop left sciatic pain, followed by PCP, who is going to refer him for his neurosurgical evaluation.  He denies any urine incontinence, retention, or constipation.  He has not remittent loose stools.  He denies any unilateral weakness or numbness.  ER visits.  Appetite is good, but he has been preparing some meals, in view that his wife cannot do that after surgery.  No trouble swallowing or GERD symptoms.  History on Initial Assessment 06/18/2019: This is an 81 year old right-handed man with a history of hypertension, hyperlipidemia, diabetes, CAD, CKD, bradycardia, anxiety, depression, presenting for evaluation of memory loss. His daughter,as well as wife on speakerphone, are present to provide additional information. Records were reviewed. He was seen 6 days ago at [facility] by neurologist Dr. Manuella Ghazi, however they would still like to proceed with visit today for the same concern. SLUMS score was 18/30.  Bloodwork was ordered, he is scheduled for an MRI brain with NeuroQuant sequence. His family feels that memory changes started only a month  ago. He became tearful stating that "I cannot remember things," making him frustrated. He states "some things I can remember, but others I do not." He would forget a conversation from 5 minutes prior. He used to remember announcer names well. He lives with his wife who has to double check behind him on his insulin, telling him what dosage to take. He writes down bills and his wife pays them. He got lost driving twice in the past month. He got lost driving to their granddaughter's graduation, he apparently got very upset/agitated. He does not remember this. He does recall getting lost driving to get gas. He states he "just about quit driving." Family now has a GPS tracker, his wife is always in the car with him now. He is independent with dressing and bathing.  His daughter reports that he used to exercise more, then with the pandemic he got depressed. Family noticed that he did not want to be around his grandchildren anymore, getting easily agitated. Some people say something and it "sends him off to another pedestal." He can be so sweet sometimes then curl up and not want to talk to anybody. He has always had issues with his siblings, "always been a part of him," this seems to be getting better. They do feel that since memory changes started, he is doing better with other people. Paxil was recently changed to Lexapro, which he has been taking for only 2 days. No paranoia or hallucinations. He was started on Donepezil '5mg'$  daily 3 weeks ago which he is overall tolerating. No family history of dementia. He does not drink alcohol. He denies any significant head injuries. He had a syncopal episode in 2018 where he woke up on the ground at work. I personally reviewed MRI brain with and without contrast done 11/2016 which did not show any acute changes. There was mild diffuse atrophy and mild to moderate chronic microvascular disease.    He used to have frequent headaches when he was working and they quieted down, but  recently come back "like they used to be." There is no associated nausea/vomiting, photo/phonophobia. Family thinks it is more stress-related. He denies any dizziness, diplopia, dysarthria/dysphagia, bowel/bladder dysfunction, anosmia. He has chronic neck and back pain. He has some tingling and numbness in his hands and feet. He has occasional hand tremors. Sleep is pretty good. They deny any REM behavior disorder, but he has fallen out of bed while sleeping. This was initially happening every 2-3 weeks, but has quieted down, last occurred 3 weeks ago. Family reports he used to exercise more but stopped when the pandemic hit.   Laboratory Data: MRI brain without contrast done 06/2019 did not show any acute changes. There was moderate chronic microvascular disease, diffuse volume loss with whole brain volume at 2nd percentile.  Neuropsychological testing done in 07/2019 noted that testing must be interpreted cautiously as he was tearful  and visibly upset throughout much of the evaluation. He did not show clear memory storage problems and instead had notable encoding issues and relatively preserved retention of information across time on several tasks. He screened positive for depression. Diagnosis was Mild Cognitive Impairment, etiology unclear, possibly mixed AD and vascular, with interference from depression and anxiety.   PREVIOUS MEDICATIONS:   CURRENT MEDICATIONS:  Outpatient Encounter Medications as of 03/25/2021  Medication Sig   acetaminophen (TYLENOL) 325 MG tablet Take 650 mg by mouth every 6 (six) hours as needed.   aspirin EC 81 MG tablet Take 81 mg by mouth daily.   calcium carbonate (OS-CAL) 600 MG TABS tablet Take 600 mg by mouth daily with breakfast.   cholecalciferol (VITAMIN D) 1000 UNITS tablet Take 2,000 Units by mouth daily.    donepezil (ARICEPT) 10 MG tablet Take 1 tablet (10 mg total) by mouth daily.   escitalopram (LEXAPRO) 20 MG tablet Take 1 tablet (20 mg total) by mouth daily.    ferrous sulfate 325 (65 FE) MG tablet Take 325 mg by mouth daily with breakfast.   furosemide (LASIX) 20 MG tablet Take 1 tablet (20 mg) by mouth once daily as needed for swelling/ weight gain/ shortness of breath   gabapentin (NEURONTIN) 300 MG capsule Take 300-600 mg by mouth See admin instructions. Take 1 capsule (300 mg) by mouth in the morning & 2 capsules (600 mg) by mouth every night.   isosorbide mononitrate (IMDUR) 30 MG 24 hr tablet Take 1 tablet (30 mg total) by mouth daily.   LEVEMIR FLEXTOUCH 100 UNIT/ML FlexPen Inject 25 Units into the skin at bedtime.   levothyroxine (SYNTHROID) 50 MCG tablet Take 50 mcg by mouth daily before breakfast.   memantine (NAMENDA) 5 MG tablet Take 1 tablet (5 mg at night) for 2 weeks, then increase to 1 tablet (5 mg) twice a day   metFORMIN (GLUCOPHAGE) 500 MG tablet Take 250 mg by mouth 2 (two) times daily with a meal.   NOVOLOG 100 UNIT/ML injection Inject 20-38 Units into the skin 3 (three) times daily with meals. Sliding scale insulin base rates= 20 units in the morning, 26 units with lunch, 26 units with supper.   simvastatin (ZOCOR) 20 MG tablet Take 20 mg by mouth daily.   tamsulosin (FLOMAX) 0.4 MG CAPS capsule Take 0.4 mg by mouth daily. Takes 2 each morning.   No facility-administered encounter medications on file as of 03/25/2021.     Objective:     PHYSICAL EXAMINATION:    VITALS:   Vitals:   03/25/21 1105  BP: (!) 110/58  Pulse: 74  Resp: 18  SpO2: 95%  Weight: 195 lb (88.5 kg)  Height: '5\' 8"'$  (1.727 m)    GEN:  The patient appears stated age and is in NAD. HEENT:  Normocephalic, atraumatic.   Neurological examination:  General: NAD, well-groomed, appears stated age. Orientation: The patient is alert. Oriented to person, place and date Cranial nerves: There is good facial symmetry.The speech is fluent and clear. No aphasia or dysarthria. Fund of knowledge is appropriate. Recent and remote memory are impaired. Attention  and concentration are reduced.  Able to name objects2/2  and repeat phrases 0/1.  Hearing is intact to conversational tone.    Sensation: Sensation is intact to light touch throughout Motor: Strength is at least antigravity x4. Tremors: none  DTR's 2/4 in UE/LE    No flowsheet data found. MMSE - Mini Mental State Exam 03/25/2021 07/23/2019  Orientation to time  5 4  Orientation to Place 5 4  Registration 3 3  Attention/ Calculation 2 2  Recall 2 3  Language- name 2 objects 2 2  Language- repeat 0 0  Language- follow 3 step command 1 3  Language- read & follow direction 1 1  Write a sentence 1 1  Copy design 0 0  Total score 22 23    [place] Mental Exam 06/18/2019  Weekday Correct 1  Current year 1  What state are we in? 1  Amount spent 1  Amount left 2  # of Animals 3  5 objects recall 3  Number series 1  Hour markers 2  Time correct 1  Placed X in triangle correctly 1  Largest Figure 1  Name of male 0  Date back to work 0  Type of work 2  State she lived in 0  Total score 20       Movement examination: Tone: There is normal tone in the UE/LE Abnormal movements:  no tremor.  No myoclonus.  No asterixis.   Coordination:  There is no decremation with RAM's. Normal finger to nose  Gait and Station: The patient has come difficulty arising out of a deep-seated chair without the use of the hand due to chronic pain. The patient's stride length is good.  Gait is cautious and narrow.    Total time spent on today's visit was 30 minutes, including both face-to-face time and nonface-to-face time. Time included that spent on review of records (prior notes available to me/labs/imaging if pertinent), discussing treatment and goals, answering patient's questions and coordinating care.  Cc:  [name], MD Sharene Butters, PA-C

## 2021-03-30 DIAGNOSIS — E1122 Type 2 diabetes mellitus with diabetic chronic kidney disease: Secondary | ICD-10-CM | POA: Diagnosis not present

## 2021-03-30 DIAGNOSIS — E1169 Type 2 diabetes mellitus with other specified complication: Secondary | ICD-10-CM | POA: Diagnosis not present

## 2021-03-30 DIAGNOSIS — Z7984 Long term (current) use of oral hypoglycemic drugs: Secondary | ICD-10-CM | POA: Diagnosis not present

## 2021-03-30 DIAGNOSIS — E1151 Type 2 diabetes mellitus with diabetic peripheral angiopathy without gangrene: Secondary | ICD-10-CM | POA: Diagnosis not present

## 2021-03-30 DIAGNOSIS — N2581 Secondary hyperparathyroidism of renal origin: Secondary | ICD-10-CM | POA: Diagnosis not present

## 2021-03-30 DIAGNOSIS — D692 Other nonthrombocytopenic purpura: Secondary | ICD-10-CM | POA: Diagnosis not present

## 2021-03-30 DIAGNOSIS — E1165 Type 2 diabetes mellitus with hyperglycemia: Secondary | ICD-10-CM | POA: Diagnosis not present

## 2021-03-30 DIAGNOSIS — Z794 Long term (current) use of insulin: Secondary | ICD-10-CM | POA: Diagnosis not present

## 2021-03-30 DIAGNOSIS — N183 Chronic kidney disease, stage 3 unspecified: Secondary | ICD-10-CM | POA: Diagnosis not present

## 2021-03-30 DIAGNOSIS — F0390 Unspecified dementia without behavioral disturbance: Secondary | ICD-10-CM | POA: Diagnosis not present

## 2021-04-07 ENCOUNTER — Telehealth: Payer: Self-pay | Admitting: Neurology

## 2021-04-07 DIAGNOSIS — M5416 Radiculopathy, lumbar region: Secondary | ICD-10-CM | POA: Diagnosis not present

## 2021-04-07 DIAGNOSIS — M48062 Spinal stenosis, lumbar region with neurogenic claudication: Secondary | ICD-10-CM | POA: Diagnosis not present

## 2021-04-07 NOTE — Telephone Encounter (Signed)
Patients daughter called and stated that her dad was put on new medication a few weeks ago.  He has been tired, sleeping more, dizzy, and grumpy.  He is supposed to start on taking more, but she doesn't want to since what he is taking now is making him this way. ?

## 2021-04-07 NOTE — Telephone Encounter (Signed)
Will hold medicine for now and report back with status in a few weeks.  ?

## 2021-05-06 DIAGNOSIS — M5136 Other intervertebral disc degeneration, lumbar region: Secondary | ICD-10-CM | POA: Diagnosis not present

## 2021-05-06 DIAGNOSIS — M5416 Radiculopathy, lumbar region: Secondary | ICD-10-CM | POA: Diagnosis not present

## 2021-05-13 DIAGNOSIS — M5416 Radiculopathy, lumbar region: Secondary | ICD-10-CM | POA: Diagnosis not present

## 2021-05-16 DIAGNOSIS — M5416 Radiculopathy, lumbar region: Secondary | ICD-10-CM | POA: Diagnosis not present

## 2021-05-19 DIAGNOSIS — H35372 Puckering of macula, left eye: Secondary | ICD-10-CM | POA: Diagnosis not present

## 2021-05-23 DIAGNOSIS — M5416 Radiculopathy, lumbar region: Secondary | ICD-10-CM | POA: Diagnosis not present

## 2021-05-26 DIAGNOSIS — M5416 Radiculopathy, lumbar region: Secondary | ICD-10-CM | POA: Diagnosis not present

## 2021-05-30 DIAGNOSIS — M5416 Radiculopathy, lumbar region: Secondary | ICD-10-CM | POA: Diagnosis not present

## 2021-06-01 DIAGNOSIS — M5416 Radiculopathy, lumbar region: Secondary | ICD-10-CM | POA: Diagnosis not present

## 2021-06-06 DIAGNOSIS — X32XXXA Exposure to sunlight, initial encounter: Secondary | ICD-10-CM | POA: Diagnosis not present

## 2021-06-06 DIAGNOSIS — L570 Actinic keratosis: Secondary | ICD-10-CM | POA: Diagnosis not present

## 2021-06-06 DIAGNOSIS — D2272 Melanocytic nevi of left lower limb, including hip: Secondary | ICD-10-CM | POA: Diagnosis not present

## 2021-06-06 DIAGNOSIS — D2261 Melanocytic nevi of right upper limb, including shoulder: Secondary | ICD-10-CM | POA: Diagnosis not present

## 2021-06-06 DIAGNOSIS — D225 Melanocytic nevi of trunk: Secondary | ICD-10-CM | POA: Diagnosis not present

## 2021-06-06 DIAGNOSIS — D0362 Melanoma in situ of left upper limb, including shoulder: Secondary | ICD-10-CM | POA: Diagnosis not present

## 2021-06-06 DIAGNOSIS — Z85828 Personal history of other malignant neoplasm of skin: Secondary | ICD-10-CM | POA: Diagnosis not present

## 2021-06-06 DIAGNOSIS — D485 Neoplasm of uncertain behavior of skin: Secondary | ICD-10-CM | POA: Diagnosis not present

## 2021-06-08 DIAGNOSIS — E1169 Type 2 diabetes mellitus with other specified complication: Secondary | ICD-10-CM | POA: Diagnosis not present

## 2021-06-08 DIAGNOSIS — E039 Hypothyroidism, unspecified: Secondary | ICD-10-CM | POA: Diagnosis not present

## 2021-06-08 DIAGNOSIS — M5416 Radiculopathy, lumbar region: Secondary | ICD-10-CM | POA: Diagnosis not present

## 2021-06-08 DIAGNOSIS — D638 Anemia in other chronic diseases classified elsewhere: Secondary | ICD-10-CM | POA: Diagnosis not present

## 2021-06-08 DIAGNOSIS — E1159 Type 2 diabetes mellitus with other circulatory complications: Secondary | ICD-10-CM | POA: Diagnosis not present

## 2021-06-08 DIAGNOSIS — E785 Hyperlipidemia, unspecified: Secondary | ICD-10-CM | POA: Diagnosis not present

## 2021-06-08 DIAGNOSIS — N1831 Chronic kidney disease, stage 3a: Secondary | ICD-10-CM | POA: Diagnosis not present

## 2021-06-08 DIAGNOSIS — Z794 Long term (current) use of insulin: Secondary | ICD-10-CM | POA: Diagnosis not present

## 2021-06-08 DIAGNOSIS — E1142 Type 2 diabetes mellitus with diabetic polyneuropathy: Secondary | ICD-10-CM | POA: Diagnosis not present

## 2021-06-08 DIAGNOSIS — I152 Hypertension secondary to endocrine disorders: Secondary | ICD-10-CM | POA: Diagnosis not present

## 2021-06-14 DIAGNOSIS — R4189 Other symptoms and signs involving cognitive functions and awareness: Secondary | ICD-10-CM | POA: Diagnosis not present

## 2021-06-14 DIAGNOSIS — F419 Anxiety disorder, unspecified: Secondary | ICD-10-CM | POA: Diagnosis not present

## 2021-06-14 DIAGNOSIS — D696 Thrombocytopenia, unspecified: Secondary | ICD-10-CM | POA: Diagnosis not present

## 2021-06-14 DIAGNOSIS — F32A Depression, unspecified: Secondary | ICD-10-CM | POA: Diagnosis not present

## 2021-06-14 DIAGNOSIS — D638 Anemia in other chronic diseases classified elsewhere: Secondary | ICD-10-CM | POA: Diagnosis not present

## 2021-06-14 DIAGNOSIS — R001 Bradycardia, unspecified: Secondary | ICD-10-CM | POA: Diagnosis not present

## 2021-06-14 DIAGNOSIS — E785 Hyperlipidemia, unspecified: Secondary | ICD-10-CM | POA: Diagnosis not present

## 2021-06-14 DIAGNOSIS — I2510 Atherosclerotic heart disease of native coronary artery without angina pectoris: Secondary | ICD-10-CM | POA: Diagnosis not present

## 2021-06-14 DIAGNOSIS — Z0000 Encounter for general adult medical examination without abnormal findings: Secondary | ICD-10-CM | POA: Diagnosis not present

## 2021-06-14 DIAGNOSIS — D0362 Melanoma in situ of left upper limb, including shoulder: Secondary | ICD-10-CM | POA: Diagnosis not present

## 2021-06-14 DIAGNOSIS — E669 Obesity, unspecified: Secondary | ICD-10-CM | POA: Diagnosis not present

## 2021-06-14 DIAGNOSIS — E1169 Type 2 diabetes mellitus with other specified complication: Secondary | ICD-10-CM | POA: Diagnosis not present

## 2021-06-14 DIAGNOSIS — N1831 Chronic kidney disease, stage 3a: Secondary | ICD-10-CM | POA: Diagnosis not present

## 2021-06-16 DIAGNOSIS — E1169 Type 2 diabetes mellitus with other specified complication: Secondary | ICD-10-CM | POA: Diagnosis not present

## 2021-06-16 DIAGNOSIS — I152 Hypertension secondary to endocrine disorders: Secondary | ICD-10-CM | POA: Diagnosis not present

## 2021-06-16 DIAGNOSIS — E039 Hypothyroidism, unspecified: Secondary | ICD-10-CM | POA: Diagnosis not present

## 2021-06-16 DIAGNOSIS — Z794 Long term (current) use of insulin: Secondary | ICD-10-CM | POA: Diagnosis not present

## 2021-06-16 DIAGNOSIS — E1159 Type 2 diabetes mellitus with other circulatory complications: Secondary | ICD-10-CM | POA: Diagnosis not present

## 2021-06-16 DIAGNOSIS — E1142 Type 2 diabetes mellitus with diabetic polyneuropathy: Secondary | ICD-10-CM | POA: Diagnosis not present

## 2021-06-16 DIAGNOSIS — E785 Hyperlipidemia, unspecified: Secondary | ICD-10-CM | POA: Diagnosis not present

## 2021-06-17 ENCOUNTER — Telehealth: Payer: Self-pay | Admitting: Cardiovascular Disease

## 2021-06-17 NOTE — Telephone Encounter (Signed)
Pt c/o medication issue:  1. Name of Medication: isosorbide mononitrate (IMDUR) 30 MG 24 hr tablet  2. How are you currently taking this medication (dosage and times per day)? Take 1 tablet (30 mg total) by mouth daily  3. Are you having a reaction (difficulty breathing--STAT)? no  4. What is your medication issue? Wife calling to see if they medication can cause patient to lose balance and also be dizzy

## 2021-06-20 NOTE — Telephone Encounter (Signed)
Pavero, Harrell Gave, RPH 6 hours ago (9:59 AM)   Yes, dizziness has been reported in 4-11% of patients on isosorbide.  Would recommend patient take HR and BP to make sure I tis not dropping his HR or BP too significantly     Called and spoke with patient's wife. Relayed the above information.   Ronald Wallace states that she spoke with the pharmacist at her pharmacy who recommended that he try taking it at night. He has done this for the past 2 days and his dizziness has been much better.   Ronald Wallace reports that she will continue to check his HR and BP and if it's low or the dizziness returns, they will call.   Ronald Wallace voiced appreciation for the call.

## 2021-06-20 NOTE — Telephone Encounter (Signed)
Yes, dizziness has been reported in 4-11% of patients on isosorbide.  Would recommend patient take HR and BP to make sure I tis not dropping his HR or BP too significantly

## 2021-06-29 DIAGNOSIS — Z7984 Long term (current) use of oral hypoglycemic drugs: Secondary | ICD-10-CM | POA: Diagnosis not present

## 2021-06-29 DIAGNOSIS — E1169 Type 2 diabetes mellitus with other specified complication: Secondary | ICD-10-CM | POA: Diagnosis not present

## 2021-06-29 DIAGNOSIS — E785 Hyperlipidemia, unspecified: Secondary | ICD-10-CM | POA: Diagnosis not present

## 2021-06-29 DIAGNOSIS — Z794 Long term (current) use of insulin: Secondary | ICD-10-CM | POA: Diagnosis not present

## 2021-06-29 DIAGNOSIS — E1165 Type 2 diabetes mellitus with hyperglycemia: Secondary | ICD-10-CM | POA: Diagnosis not present

## 2021-07-07 ENCOUNTER — Other Ambulatory Visit: Payer: Self-pay | Admitting: Neurology

## 2021-07-11 ENCOUNTER — Telehealth: Payer: Self-pay | Admitting: Neurology

## 2021-07-11 MED ORDER — DONEPEZIL HCL 10 MG PO TABS: 10.0000 mg | ORAL_TABLET | Freq: Every day | ORAL | 0 refills | Status: DC

## 2021-07-11 MED ORDER — ESCITALOPRAM OXALATE 20 MG PO TABS: 20.0000 mg | ORAL_TABLET | Freq: Every day | ORAL | 0 refills | Status: DC

## 2021-07-11 NOTE — Telephone Encounter (Signed)
There are no samples for these medications, we can certainly ask the pharmacy to fill earlier, but pls let them know that they will likely be asked to pay out of pocket.

## 2021-07-27 DIAGNOSIS — E1142 Type 2 diabetes mellitus with diabetic polyneuropathy: Secondary | ICD-10-CM | POA: Diagnosis not present

## 2021-07-27 DIAGNOSIS — M48062 Spinal stenosis, lumbar region with neurogenic claudication: Secondary | ICD-10-CM | POA: Diagnosis not present

## 2021-07-27 DIAGNOSIS — M5416 Radiculopathy, lumbar region: Secondary | ICD-10-CM | POA: Diagnosis not present

## 2021-08-23 DIAGNOSIS — F32A Depression, unspecified: Secondary | ICD-10-CM | POA: Diagnosis not present

## 2021-08-23 DIAGNOSIS — F419 Anxiety disorder, unspecified: Secondary | ICD-10-CM | POA: Diagnosis not present

## 2021-08-30 DIAGNOSIS — E039 Hypothyroidism, unspecified: Secondary | ICD-10-CM | POA: Diagnosis not present

## 2021-08-30 DIAGNOSIS — I152 Hypertension secondary to endocrine disorders: Secondary | ICD-10-CM | POA: Diagnosis not present

## 2021-08-30 DIAGNOSIS — E785 Hyperlipidemia, unspecified: Secondary | ICD-10-CM | POA: Diagnosis not present

## 2021-08-30 DIAGNOSIS — Z794 Long term (current) use of insulin: Secondary | ICD-10-CM | POA: Diagnosis not present

## 2021-08-30 DIAGNOSIS — E1159 Type 2 diabetes mellitus with other circulatory complications: Secondary | ICD-10-CM | POA: Diagnosis not present

## 2021-08-30 DIAGNOSIS — E1169 Type 2 diabetes mellitus with other specified complication: Secondary | ICD-10-CM | POA: Diagnosis not present

## 2021-08-30 DIAGNOSIS — E1142 Type 2 diabetes mellitus with diabetic polyneuropathy: Secondary | ICD-10-CM | POA: Diagnosis not present

## 2021-09-23 ENCOUNTER — Ambulatory Visit: Payer: HMO | Admitting: Neurology

## 2021-09-23 ENCOUNTER — Encounter: Payer: Self-pay | Admitting: Neurology

## 2021-09-23 VITALS — BP 144/60 | HR 57 | Ht 65.0 in | Wt 194.6 lb

## 2021-09-23 DIAGNOSIS — G309 Alzheimer's disease, unspecified: Secondary | ICD-10-CM | POA: Diagnosis not present

## 2021-09-23 DIAGNOSIS — F01518 Vascular dementia, unspecified severity, with other behavioral disturbance: Secondary | ICD-10-CM | POA: Diagnosis not present

## 2021-09-23 DIAGNOSIS — F02818 Dementia in other diseases classified elsewhere, unspecified severity, with other behavioral disturbance: Secondary | ICD-10-CM

## 2021-09-23 DIAGNOSIS — R42 Dizziness and giddiness: Secondary | ICD-10-CM | POA: Diagnosis not present

## 2021-09-23 MED ORDER — ESCITALOPRAM OXALATE 20 MG PO TABS
ORAL_TABLET | ORAL | 3 refills | Status: DC
Start: 2021-09-23 — End: 2022-04-25

## 2021-09-23 MED ORDER — DONEPEZIL HCL 10 MG PO TABS
10.0000 mg | ORAL_TABLET | Freq: Every day | ORAL | 3 refills | Status: DC
Start: 2021-09-23 — End: 2022-04-25

## 2021-09-23 NOTE — Progress Notes (Signed)
NEUROLOGY FOLLOW UP OFFICE NOTE  Ronald Wallace [id number] 31-Jul-1940  HISTORY OF PRESENT ILLNESS: I had the pleasure of seeing Ronald Wallace in follow-up in the neurology clinic on 09/23/2021.  The patient was last seen 6 months ago by Memory Disorders PA Sharene Butters for dementia. MMSE 22/30 in 03/2021. Memantine was added however he had dizziness and stopped medication. He continues on Donepezil '10mg'$  daily. He is on Lexapro '20mg'$  daily for mood. He reports that despite stopping Memantine, the dizziness continues. There is a spinning sensation more when changing positions or getting out of bed/sitting up. It lasts 10-15 minutes until he gets situated. He feels things are moving with his vision. No associated nausea/vomiting, headache, focal numbness/tingling/weakness. He denies any falls but his family reports he has fallen/rolled out of bed a couple of times when dreaming. He acts out his dreams sometimes. No visual hallucinations. His wife feels his memory has really gotten bad, he cannot remember people's names from 5 minutes prior. He got very disoriented when they travelled for a wedding. She reports worsening irritability mostly with her, if she says something he says she is yelling at him. He states he has "quit worrying about things, I let it go." He states he blows up sometimes when he hears it "all the time." No paranoia or hallucinations. His wife fixes his pillbox and he takes medications by himself. She checks behind him and notes he does well. He drives once a month for an errand but has "just about given up" driving. He got a phone call scam the other day which upset him. He is always tired lately.   History on Initial Assessment 06/18/2019: This is an 81 year old right-handed man with a history of hypertension, hyperlipidemia, diabetes, CAD, CKD, bradycardia, anxiety, depression, presenting for evaluation of memory loss. His daughter,as well as wife on speakerphone, are present to provide  additional information. Records were reviewed. He was seen 6 days ago at [facility] by neurologist Dr. Manuella Ghazi, however they would still like to proceed with visit today for the same concern. SLUMS score was 18/30.  Bloodwork was ordered, he is scheduled for an MRI brain with NeuroQuant sequence. His family feels that memory changes started only a month ago. He became tearful stating that "I cannot remember things," making him frustrated. He states "some things I can remember, but others I do not." He would forget a conversation from 5 minutes prior. He used to remember announcer names well. He lives with his wife who has to double check behind him on his insulin, telling him what dosage to take. He writes down bills and his wife pays them. He got lost driving twice in the past month. He got lost driving to their granddaughter's graduation, he apparently got very upset/agitated. He does not remember this. He does recall getting lost driving to get gas. He states he "just about quit driving." Family now has a GPS tracker, his wife is always in the car with him now. He is independent with dressing and bathing.  His daughter reports that he used to exercise more, then with the pandemic he got depressed. Family noticed that he did not want to be around his grandchildren anymore, getting easily agitated. Some people say something and it "sends him off to another pedestal." He can be so sweet sometimes then curl up and not want to talk to anybody. He has always had issues with his siblings, "always been a part of him," this seems to  be getting better. They do feel that since memory changes started, he is doing better with other people. Paxil was recently changed to Lexapro, which he has been taking for only 2 days. No paranoia or hallucinations. He was started on Donepezil '5mg'$  daily 3 weeks ago which he is overall tolerating. No family history of dementia. He does not drink alcohol. He denies any significant head  injuries. He had a syncopal episode in 2018 where he woke up on the ground at work. I personally reviewed MRI brain with and without contrast done 11/2016 which did not show any acute changes. There was mild diffuse atrophy and mild to moderate chronic microvascular disease.   He used to have frequent headaches when he was working and they quieted down, but recently come back "like they used to be." There is no associated nausea/vomiting, photo/phonophobia. Family thinks it is more stress-related. He denies any dizziness, diplopia, dysarthria/dysphagia, bowel/bladder dysfunction, anosmia. He has chronic neck and back pain. He has some tingling and numbness in his hands and feet. He has occasional hand tremors. Sleep is pretty good. They deny any REM behavior disorder, but he has fallen out of bed while sleeping. This was initially happening every 2-3 weeks, but has quieted down, last occurred 3 weeks ago. Family reports he used to exercise more but stopped when the pandemic hit.  Laboratory Data: MRI brain without contrast done 06/2019 did not show any acute changes. There was moderate chronic microvascular disease, diffuse volume loss with whole brain volume at 2nd percentile.  Neuropsychological testing done in 07/2019 noted that testing must be interpreted cautiously as he was tearful and visibly upset throughout much of the evaluation. He did not show clear memory storage problems and instead had notable encoding issues and relatively preserved retention of information across time on several tasks. He screened positive for depression. Diagnosis was Mild Cognitive Impairment, etiology unclear, possibly mixed AD and vascular, with interference from depression and anxiety.   Repeat Neuropsychological evaluation in 07/2020 indicated transition to Major Neurocognitive Disorder (dementia) compared to prior evaluation. Although there was a good degree of stability compared to prior testing, there was diffuse  cognitive impairment with most prominent deficits across processing speed, attention/concentration, executive functioning, receptive language, and semantic fluency. It was noted his affect was much improved compared to prior, which may account for mild improvements in some domains. 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  PAST MEDICAL HISTORY: Past Medical History:  Diagnosis Date   Anemia of chronic disease 07/01/2015   Arthritis    Benign prostatic hyperplasia with nocturia 10/30/2016   Bradycardia 03/12/2014   Bruit of left carotid artery 03/12/2014   CKD (chronic kidney disease) 09/27/2015   stage 3, GFR 30-59 ml/min   Coronary artery disease involving native coronary artery of native heart without angina pectoris    a. 02/2014 Neg Ex MV, EF 65%;  b. 06/2015 Abnl MV ([facility])-->Cath ([facility]): LM nl, LAD 30p/m, LCX 30p, OM2 50p, RCA 100p w/ L-->R collats, nl EF-->Med Rx; c. 10/2017 MV: No isch/scar; d. 11/2017 Cath: LM nl, LAD 45p/m, LCX 30ost, OM2 30, RCA 100 CTO, L->R collats. PA 31/16/22, PCWP 11; e. 05/2020 MV: EF 67%, no isch/scar->low risk.   Diabetes mellitus, type II, insulin dependent    Diabetic neuropathy    Diabetic retinopathy associated with type 2 diabetes mellitus 10/28/2018   Disorder of bone and cartilage    Diverticulitis    DOE (dyspnea on exertion) 03/12/2014   Generalized anxiety disorder  GERD (gastroesophageal reflux disease)    Hypertension associated with diabetes    Major depressive disorder    Major neurocognitive disorder due to multiple etiologies 08/04/2020   likely Alzheimer's disease w/vascular contributions   Mixed hyperlipidemia due to type 2 diabetes mellitus 02/06/2017   Obesity    Osteoporosis    Pure hypercholesterolemia    Syncope and collapse    a. 02/2014 Carotid Dopplers: 1-39% bilateral ICA with normal Subclavian & vertebral arteries; b. 02/2014 2 D Echo: EF 50-55%, Gr 1 DD, Mild MR & LA dilation -- was bradycardic in 40s; c.  04/2014 Event monitor: RSR/SA/SB. Occas PVC's. No AFib.    MEDICATIONS: Current Outpatient Medications on File Prior to Visit  Medication Sig Dispense Refill   acetaminophen (TYLENOL) 325 MG tablet Take 650 mg by mouth every 6 (six) hours as needed.     aspirin EC 81 MG tablet Take 81 mg by mouth daily.     calcium carbonate (OS-CAL) 600 MG TABS tablet Take 600 mg by mouth daily with breakfast.     cholecalciferol (VITAMIN D) 1000 UNITS tablet Take 2,000 Units by mouth daily.      cyanocobalamin (VITAMIN B12) 1000 MCG tablet Take 1,000 mcg by mouth daily.     donepezil (ARICEPT) 10 MG tablet Take 1 tablet (10 mg total) by mouth daily. 90 tablet 0   escitalopram (LEXAPRO) 20 MG tablet Take 1 tablet (20 mg total) by mouth daily. 90 tablet 0   ferrous sulfate 325 (65 FE) MG tablet Take 325 mg by mouth daily with breakfast.     gabapentin (NEURONTIN) 300 MG capsule Take 300-600 mg by mouth See admin instructions. Take 1 capsule (300 mg) by mouth in the morning & 2 capsules (600 mg) by mouth every night.     hydrALAZINE (APRESOLINE) 25 MG tablet Take 25 mg by mouth 3 (three) times daily.     isosorbide mononitrate (IMDUR) 30 MG 24 hr tablet Take 1 tablet (30 mg total) by mouth daily. 90 tablet 3   levothyroxine (SYNTHROID) 50 MCG tablet Take 50 mcg by mouth daily before breakfast.     metFORMIN (GLUCOPHAGE) 500 MG tablet Take 250 mg by mouth 2 (two) times daily with a meal.     NOVOLOG 100 UNIT/ML injection Inject 20-38 Units into the skin 3 (three) times daily with meals. Sliding scale insulin base rates= 20 units in the morning, 26 units with lunch, 26 units with supper.     simvastatin (ZOCOR) 20 MG tablet Take 20 mg by mouth daily.     tamsulosin (FLOMAX) 0.4 MG CAPS capsule Take 0.4 mg by mouth daily. Takes 2 each morning.     Vibegron (GEMTESA) 75 MG TABS Take by mouth.     memantine (NAMENDA) 5 MG tablet Take 1 tablet (5 mg at night) for 2 weeks, then increase to 1 tablet (5 mg) twice a day  (Patient not taking: Reported on 09/23/2021) 60 tablet 1   traMADol (ULTRAM) 50 MG tablet Take 25-50 mg by mouth 2 (two) times daily as needed.     No current facility-administered medications on file prior to visit.    ALLERGIES: Allergies  Allergen Reactions   Amlodipine     Other reaction(s): Other (See Comments) Peripheral edema    FAMILY HISTORY: Family History  Problem Relation Age of Onset   Heart attack Mother    Breast cancer Mother    Lung cancer Father     SOCIAL HISTORY: Social History  Socioeconomic History   Marital status: Married    Spouse name: Not on file   Number of children: Not on file   Years of education: 14   Highest education level: Associate degree: academic program  Occupational History   Occupation: Retired    Comment: Youth worker  Tobacco Use   Smoking status: Former    Types: Pipe    Quit date: 1965    Years since quitting: 58.7   Smokeless tobacco: Never  Vaping Use   Vaping Use: Never used  Substance and Sexual Activity   Alcohol use: No   Drug use: No   Sexual activity: Not on file  Other Topics Concern   Not on file  Social History Narrative   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   Married - at least 1 daughter.   Never Smoked   Usually very active, but no exercise routine.   Right handed    Social Determinants of Health   Financial Resource Strain: Not on file  Food Insecurity: Not on file  Transportation Needs: Not on file  Physical Activity: Not on file  Stress: Not on file  Social Connections: Not on file  Intimate Partner Violence: Not on file     PHYSICAL EXAM: Vitals:   09/23/21 1010  BP: (!) 144/60  Pulse: (!) 57  SpO2: 95%   General: No acute distress Head:  Normocephalic/atraumatic Skin/Extremities: No rash, no edema Neurological Exam: alert and oriented to person, place, and time. No aphasia or dysarthria. Fund of knowledge is appropriate.  Recent and remote memory are intact, 3/3 delayed  recall.  Attention and concentration are reduced, 3/5 WORLD backwards.   Cranial nerves: Pupils equal, round. Extraocular movements intact with no nystagmus. Visual fields full.  No facial asymmetry.  Motor: Bulk and tone normal, muscle strength 5/5 throughout with no pronator drift.   Finger to nose testing intact.  Gait narrow-based and steady, no ataxia. Romberg negative.   IMPRESSION: This is a 81 yo RH man with a history of hypertension, hyperlipidemia, diabetes, CAD, CKD, bradycardia, anxiety, depression, with Neuropsychological evaluation indicating Major Neurocognitive Disorder (dementia), likely mixed vascular and AD. He reported dizziness with Memantine, however it is unclear if Memantine is the culprit as positional dizziness continues. He agrees to Vestibular therapy referral, and if dizziness improves, will consider restarting Memantine. Continue Donepezil '10mg'$  daily. His wife reports increased irritability, increase Lexapro to '30mg'$  daily. Continue close supervision, monitor driving. Follow-up with Memory Disorders PA Sharene Butters in 6 months, they know to call for any changes.   Thank you for allowing me to participate in his care.  Please do not hesitate to call for any questions or concerns.    Ellouise Newer, M.D.   CC: Dr. Netty Starring

## 2021-09-23 NOTE — Patient Instructions (Signed)
Good to see you.  Increase Lexapro (Escitalopram) '20mg'$ : take 1 and 1/2 tablets daily  2. Continue Donepezil (Aricept) '10mg'$  daily  3. Referral will be sent to Vestibular therapy. Depending on how you are feeling, please update me if you would like to restart the Memantine  4. Follow-up in 6 months, call for any changes   FALL PRECAUTIONS: Be cautious when walking. Scan the area for obstacles that may increase the risk of trips and falls. When getting up in the mornings, sit up at the edge of the bed for a few minutes before getting out of bed. Consider elevating the bed at the head end to avoid drop of blood pressure when getting up. Walk always in a well-lit room (use night lights in the walls). Avoid area rugs or power cords from appliances in the middle of the walkways. Use a walker or a cane if necessary and consider physical therapy for balance exercise. Get your eyesight checked regularly.  FINANCIAL OVERSIGHT: Supervision, especially oversight when making financial decisions or transactions is also recommended.  HOME SAFETY: Consider the safety of the kitchen when operating appliances like stoves, microwave oven, and blender. Consider having supervision and share cooking responsibilities until no longer able to participate in those. Accidents with firearms and other hazards in the house should be identified and addressed as well.  DRIVING: Regarding driving, in patients with progressive memory problems, driving will be impaired. We advise to have someone else do the driving if trouble finding directions or if minor accidents are reported. Independent driving assessment is available to determine safety of driving.  ABILITY TO BE LEFT ALONE: If patient is unable to contact 911 operator, consider using LifeLine, or when the need is there, arrange for someone to stay with patients. Smoking is a fire hazard, consider supervision or cessation. Risk of wandering should be assessed by caregiver and  if detected at any point, supervision and safe proof recommendations should be instituted.  MEDICATION SUPERVISION: Inability to self-administer medication needs to be constantly addressed. Implement a mechanism to ensure safe administration of the medications.  RECOMMENDATIONS FOR ALL PATIENTS WITH MEMORY PROBLEMS: 1. Continue to exercise (Recommend 30 minutes of walking everyday, or 3 hours every week) 2. Increase social interactions - continue going to [place] and enjoy social gatherings with friends and family 3. Eat healthy, avoid fried foods and eat more fruits and vegetables 4. Maintain adequate blood pressure, blood sugar, and blood cholesterol level. Reducing the risk of stroke and cardiovascular disease also helps promoting better memory. 5. Avoid stressful situations. Live a simple life and avoid aggravations. Organize your time and prepare for the next day in anticipation. 6. Sleep well, avoid any interruptions of sleep and avoid any distractions in the bedroom that may interfere with adequate sleep quality 7. Avoid sugar, avoid sweets as there is a strong link between excessive sugar intake, diabetes, and cognitive impairment The Mediterranean diet has been shown to help patients reduce the risk of progressive memory disorders and reduces cardiovascular risk. This includes eating fish, eat fruits and green leafy vegetables, nuts like almonds and hazelnuts, walnuts, and also use olive oil. Avoid fast foods and fried foods as much as possible. Avoid sweets and sugar as sugar use has been linked to worsening of memory function.  There is always a concern of gradual progression of memory problems. If this is the case, then we may need to adjust level of care according to patient needs. Support, both to the patient and caregiver, should  then be put into place.

## 2021-10-13 ENCOUNTER — Telehealth: Payer: Self-pay | Admitting: Neurology

## 2021-10-13 NOTE — Telephone Encounter (Signed)
Pt's wife called in stating the pt isn't tolerating the upped dosage of the Lexapro. It's been making him tired and not sleeping well.

## 2021-10-13 NOTE — Telephone Encounter (Signed)
Spoke with pt wife have Ronald Wallace  go back to Lexapro '20mg'$ : 1 tablet daily

## 2021-10-13 NOTE — Telephone Encounter (Signed)
Pls have him go back to '20mg'$ : 1 tablet daily. Thanks

## 2021-12-19 NOTE — Progress Notes (Unsigned)
Cardiology Office Note  Date:  12/20/2021   ID:  Ronald Wallace, DOB 02-26-1940, MRN [id number]  PCP:  [name], MD   Chief Complaint  Patient presents with   12 month follow up     Patient c/o feet swelling, shortness of breath, bradycardia and dizziness. Medications reviewed by the patient verbally.     HPI:  Ronald Wallace is a 81 y/o male with a prior h/o  syncope,  HTN,  HL,   DM, remote smoking, pipe, , no cigarettes increasing dyspnea and underwent stress testing @ [facility], which was abnl,   CAD cath in late June 2017 revealing moderate LAD and LCX/OM dzs with a total occlusion of the prox RCA,  L  R collaterals.   LV fxn was nl,  who presents for routine follow-up of his coronary artery disease,  chronic shortness of breath  Last seen in clinic by myself October 2022 In follow-up today he presents with his wife  He reports that he is having occasional dizzy spells,  legs chronically weak, Bradycardia has been noted Sedentary last 2 years no regular walking or exercise program Having mild lower extremity edema, sitting more Reports having chronic fatigue  Lab work reviewed A1c 8.8 CR 1.5 HGb 11.3  Prior medication changes Off potassium and losartan for high potassium  Myoview 05/2020, reviewed on discussion today Normal pharmacologic myocardial perfusion stress test without significant ischemia or scar. The left ventricular ejection fraction is normal by visual estimation (LVEF 67% by Siemens calculation, 48% by QGS). Coronary artery calcification and aortic atherosclerosis are noted on the attenuation correction CT. This is a low risk study.   EKG personally reviewed by myself on todays visit Shows Normal sinus rhythm with rate 53 bpm no significant ST or T-wave changes  cardiac catheterization November 2019 for shortness of breath  right and left heart cath   Right heart pressures were within normal range RA: mean 11, RV 39/10/15,  PA: 31/16/22, Wedge: 11  Mild to moderate mid LAD disease, mild proximal and mid LCX disease, Occluded proximal RCA with left to right collaterals Normal right heart pressures Prox LAD to Mid LAD lesion is 45% stenosed. 2nd Mrg lesion is 30% stenosed. Ost Cx to Prox Cx lesion is 30% stenosed. Prox RCA to Dist RCA lesion is 100% stenosed.     other past medical history reviewed  catheterization  June 2017 discussed with him, 30% LAD disease, occluded RCA with collaterals  02/2014 Carotid Dopplers: 1-39% bilateral ICA with normal Subclavian & vertebral arteries; b. 02/2014 2 D Echo: EF 50-55%, Gr 1 DD, Mild MR & LA dilation -- was bradycardic in 40s  02/2014 Neg Ex MV, EF 65%;  b. 06/2015 Abnl MV ([facility])-->Cath ([facility]): LM nl, LAD 30p/m, LCX 30p, OM2 50p, RCA 100p w/ L-->R collats, nl EF-->Med Rx.    PMH:   has a past medical history of Anemia of chronic disease (07/01/2015), Arthritis, Benign prostatic hyperplasia with nocturia (10/30/2016), Bradycardia (03/12/2014), Bruit of left carotid artery (03/12/2014), CKD (chronic kidney disease) (09/27/2015), Coronary artery disease involving native coronary artery of native heart without angina pectoris, Diabetes mellitus, type II, insulin dependent, Diabetic neuropathy, Diabetic retinopathy associated with type 2 diabetes mellitus (10/28/2018), Disorder of bone and cartilage, Diverticulitis, DOE (dyspnea on exertion) (03/12/2014), Generalized anxiety disorder, GERD (gastroesophageal reflux disease), Hypertension associated with diabetes, Major depressive disorder, Major neurocognitive disorder due to multiple etiologies (08/04/2020), Mixed hyperlipidemia due to type 2 diabetes mellitus (02/06/2017), Obesity, Osteoporosis, Pure hypercholesterolemia,  and Syncope and collapse.  PSH:    Past Surgical History:  Procedure Laterality Date   CARDIAC CATHETERIZATION Left 07/07/2015   Procedure: Left Heart Cath and Coronary Angiography;  Surgeon: Isaias Cowman, MD;  Location: [place] [facility];  Service: Cardiovascular;  Laterality: Left;   CARPAL TUNNEL RELEASE     CATARACT EXTRACTION Right    CATARACT EXTRACTION W/PHACO Left 10/06/2015   Procedure: CATARACT EXTRACTION PHACO AND INTRAOCULAR LENS PLACEMENT ([place]);  Surgeon: Estill Cotta, MD;  Location: [facility];  Service: Ophthalmology;  Laterality: Left;  [place] 01:42 AP% 25.1 CDE 45.92 Fluid pack lot # [id number]   COLONOSCOPY     COLONOSCOPY WITH PROPOFOL N/A 12/04/2018   Procedure: COLONOSCOPY WITH PROPOFOL;  Surgeon: Toledo, Benay Pike, MD;  Location: [facility];  Service: Gastroenterology;  Laterality: N/A;   NM MYOVIEW LTD  03/13/2014   LOW RISK - positive EKG but negative imaging: 2 mm ST Depression in V4 & V5 but No scintigraphic evidence of Ischemia or Infarct o   RIGHT/LEFT HEART CATH AND CORONARY ANGIOGRAPHY N/A 11/21/2017   Procedure: RIGHT/LEFT HEART CATH AND CORONARY ANGIOGRAPHY;  Surgeon: Minna Merritts, MD;  Location: [place] [facility];  Service: Cardiovascular;  Laterality: N/A;   TRANSTHORACIC ECHOCARDIOGRAM  03/12/2014   EF 50-55%, Gr 1 DD, no RWMA, Mlid MR & LA dil, Normla RV and bradycardic 40s -    Current Outpatient Medications  Medication Sig Dispense Refill   acetaminophen (TYLENOL) 325 MG tablet Take 650 mg by mouth every 6 (six) hours as needed.     aspirin EC 81 MG tablet Take 81 mg by mouth daily.     calcium carbonate (OS-CAL) 600 MG TABS tablet Take 600 mg by mouth daily with breakfast.     cholecalciferol (VITAMIN D) 1000 UNITS tablet Take 2,000 Units by mouth daily.      cyanocobalamin (VITAMIN B12) 1000 MCG tablet Take 1,000 mcg by mouth daily.     donepezil (ARICEPT) 10 MG tablet Take 1 tablet (10 mg total) by mouth daily. 90 tablet 3   escitalopram (LEXAPRO) 20 MG tablet Take 1 and 1/2 tablets daily 135 tablet 3   ferrous sulfate 325 (65 FE) MG tablet Take 325 mg by mouth daily with breakfast.     gabapentin (NEURONTIN) 300 MG capsule Take  300-600 mg by mouth See admin instructions. Take 1 capsule (300 mg) by mouth in the morning & 2 capsules (600 mg) by mouth every night.     hydrALAZINE (APRESOLINE) 25 MG tablet Take 25 mg by mouth 3 (three) times daily.     isosorbide mononitrate (IMDUR) 30 MG 24 hr tablet Take 1 tablet (30 mg total) by mouth daily. 90 tablet 3   levothyroxine (SYNTHROID) 50 MCG tablet Take 50 mcg by mouth daily before breakfast.     metFORMIN (GLUCOPHAGE) 500 MG tablet Take 250 mg by mouth 2 (two) times daily with a meal.     NOVOLOG 100 UNIT/ML injection Inject 20-38 Units into the skin 3 (three) times daily with meals. Sliding scale insulin base rates= 20 units in the morning, 26 units with lunch, 26 units with supper.     simvastatin (ZOCOR) 20 MG tablet Take 20 mg by mouth daily.     tamsulosin (FLOMAX) 0.4 MG CAPS capsule Take 0.4 mg by mouth daily. Takes 2 each morning.     traMADol (ULTRAM) 50 MG tablet Take 25-50 mg by mouth 2 (two) times daily as needed.  TRESIBA FLEXTOUCH 100 UNIT/ML FlexTouch Pen SMARTSIG:25 Unit(s) SUB-Q Daily     Vibegron (GEMTESA) 75 MG TABS Take by mouth. (Patient not taking: Reported on 12/20/2021)     No current facility-administered medications for this visit.     Allergies:   Amlodipine   Social History:  The patient  reports that he quit smoking about 58 years ago. His smoking use included pipe. He has never used smokeless tobacco. He reports that he does not drink alcohol and does not use drugs.   Family History:   family history includes Breast cancer in his mother; Heart attack in his mother; Lung cancer in his father.    Review of Systems: Review of Systems  Constitutional: Negative.   Respiratory:  Positive for shortness of breath.   Cardiovascular: Negative.   Gastrointestinal: Negative.   Musculoskeletal: Negative.   Neurological: Negative.   Psychiatric/Behavioral: Negative.    All other systems reviewed and are negative.  PHYSICAL EXAM: VS:  BP (!)  148/60 (BP Location: Left Arm, Patient Position: Sitting, Cuff Size: Normal)   Pulse (!) 53   Ht '5\' 8"'$  (1.727 m)   Wt 202 lb 8 oz (91.9 kg)   SpO2 95%   BMI 30.79 kg/m  , BMI Body mass index is 30.79 kg/m. Constitutional:  oriented to person, place, and time. No distress.  HENT:  Head: Grossly normal Eyes:  no discharge. No scleral icterus.  Neck: No JVD, no carotid bruits  Cardiovascular: Regular rate and rhythm, no murmurs appreciated Pulmonary/Chest: Clear to auscultation bilaterally, no wheezes or rails Abdominal: Soft.  no distension.  no tenderness.  Musculoskeletal: Normal range of motion Neurological:  normal muscle tone. Coordination normal. No atrophy Skin: Skin warm and dry Psychiatric: normal affect, pleasant  Recent Labs: No results found for requested labs within last 365 days.    Lipid Panel No results found for: "CHOL", "HDL", "LDLCALC", "TRIG"    Wt Readings from Last 3 Encounters:  12/20/21 202 lb 8 oz (91.9 kg)  09/23/21 194 lb 9.6 oz (88.3 kg)  03/25/21 195 lb (88.5 kg)      ASSESSMENT AND PLAN:  Coronary artery disease with stable angina catheterization November 2019 showing no significant change in his disease RCA occluded with collaterals from left to right -Underwent stress test May 2022 with no ischemia -We have recommended cardiac rehab Stressed importance of aggressive diabetes control, A1c running 8.8  Essential hypertension -  Reports having episodes of dizziness, we will place a ZIO monitor No change to medications given symptoms although blood pressure running mildly elevated on today's visit  Mixed hyperlipidemia -  Cholesterol is at goal on the current lipid regimen. No changes to the medications were made.  Diabetes mellitus, type II, insulin dependent ([place]) - Plan: EKG 12-Lead Very sedentary, poor diet A1c 8.8 We will recommend dietary changes, cardiac rehab  DOE (dyspnea on exertion) -  Chronic shortness of breath likely  secondary to chronic stable angina, deconditioning Stress test no ischemia Stressed importance of changing his sedentary lifestyle and working on his deconditioning  Chronic renal impairment, stage 3 (moderate)  Not on Lasix secondary to chronic renal insufficiency creatinine 1.5   Total encounter time more than 30 minutes  Greater than 50% was spent in counseling and coordination of care with the patient    No orders of the defined types were placed in this encounter.    Signed, Esmond Plants, M.D., Ph.D. 12/20/2021  [place], [place]

## 2021-12-20 ENCOUNTER — Ambulatory Visit (INDEPENDENT_AMBULATORY_CARE_PROVIDER_SITE_OTHER): Payer: HMO

## 2021-12-20 ENCOUNTER — Encounter: Payer: Self-pay | Admitting: Cardiovascular Disease

## 2021-12-20 ENCOUNTER — Ambulatory Visit: Payer: HMO | Attending: Cardiovascular Disease | Admitting: Cardiovascular Disease

## 2021-12-20 VITALS — BP 148/60 | HR 53 | Ht 68.0 in | Wt 202.5 lb

## 2021-12-20 DIAGNOSIS — E785 Hyperlipidemia, unspecified: Secondary | ICD-10-CM

## 2021-12-20 DIAGNOSIS — I10 Essential (primary) hypertension: Secondary | ICD-10-CM | POA: Diagnosis not present

## 2021-12-20 DIAGNOSIS — N183 Chronic kidney disease, stage 3 unspecified: Secondary | ICD-10-CM

## 2021-12-20 DIAGNOSIS — R001 Bradycardia, unspecified: Secondary | ICD-10-CM

## 2021-12-20 DIAGNOSIS — I25118 Atherosclerotic heart disease of native coronary artery with other forms of angina pectoris: Secondary | ICD-10-CM | POA: Diagnosis not present

## 2021-12-20 DIAGNOSIS — I951 Orthostatic hypotension: Secondary | ICD-10-CM

## 2021-12-20 DIAGNOSIS — R0609 Other forms of dyspnea: Secondary | ICD-10-CM

## 2021-12-20 DIAGNOSIS — I5032 Chronic diastolic (congestive) heart failure: Secondary | ICD-10-CM

## 2021-12-20 NOTE — Patient Instructions (Addendum)
Cardiac rehab: chronic stable angina   Medication Instructions:  No changes  If you need a refill on your cardiac medications before your next appointment, please call your pharmacy.   Lab work: No new labs needed  Testing/Procedures: Echocardiogram for CAD, leg swelling/edema Zio monitor for bradycardia, dizziness  Follow-Up: At [facility], you and your health needs are our priority.  As part of our continuing mission to provide you with exceptional heart care, we have created designated Provider Care Teams.  These Care Teams include your primary Cardiologist (physician) and Advanced Practice Providers (APPs -  Physician Assistants and Nurse Practitioners) who all work together to provide you with the care you need, when you need it.  You will need a follow up appointment in 3 months  Providers on your designated Care Team:   Murray Hodgkins, NP Christell Faith, PA-C Cadence Kathlen Mody, [place]  COVID-19 Vaccine Information can be found at: [email or web address] For questions related to vaccine distribution or appointments, please email [email or web address] or call 5068216413.

## 2021-12-24 DIAGNOSIS — R001 Bradycardia, unspecified: Secondary | ICD-10-CM

## 2022-01-03 ENCOUNTER — Other Ambulatory Visit: Payer: Self-pay | Admitting: Family Medicine

## 2022-01-03 DIAGNOSIS — M5416 Radiculopathy, lumbar region: Secondary | ICD-10-CM

## 2022-01-04 ENCOUNTER — Ambulatory Visit: Payer: PPO

## 2022-01-05 ENCOUNTER — Telehealth: Payer: Self-pay | Admitting: Physician Assistant

## 2022-01-05 NOTE — Telephone Encounter (Signed)
No answer at 4:02 01/05/2022

## 2022-01-05 NOTE — Telephone Encounter (Signed)
Pt's wife called in and left a message. She has a question about the dosage the pt should be taking of his escitalopram.

## 2022-01-06 NOTE — Telephone Encounter (Signed)
Left message to call office if he still needed anything, current dose per Clarise Cruz was '30mg'$ 

## 2022-01-14 ENCOUNTER — Other Ambulatory Visit: Payer: Self-pay | Admitting: Cardiovascular Disease

## 2022-01-14 IMAGING — MR MR HEAD W/O CM
3 series · 48 of 48 positions shown · non-contrast
Comparison: 11/17/2016

CLINICAL DATA: Cognitive impairment.

EXAM:
MRI HEAD WITHOUT CONTRAST
TECHNIQUE: Multiplanar, multiecho pulse sequences of the brain and surrounding
structures were obtained without intravenous contrast.
Additionally, using NeuroQuant software a 3D volumetric analysis of
the brain was performed and is compared to a normative database
adjusted for age, gender and intracranial volume.

[Series 52: nqsegcb_sc_cor · 1.00mm/px · 17 of 230 slices shown]
[im 1/230]
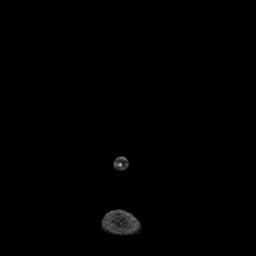
[im 15/230]
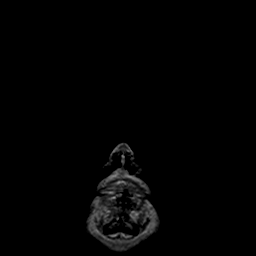
[im 29/230]
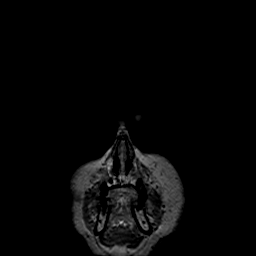
[im 43/230]
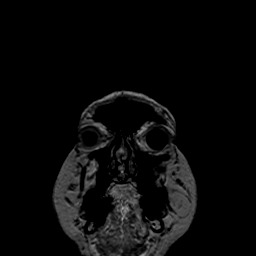
[im 58/230]
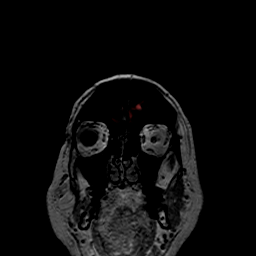
[im 72/230]
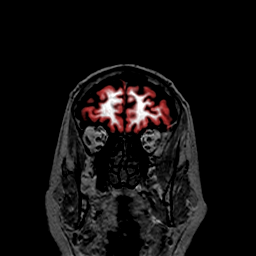
[im 86/230]
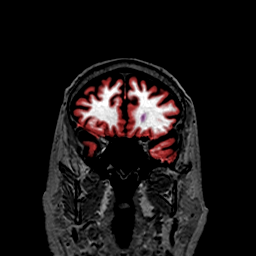
[im 101/230]
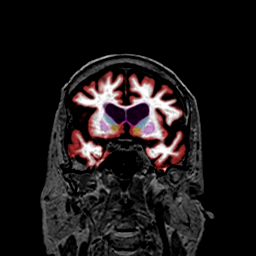
[im 115/230]
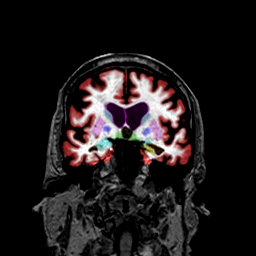
[im 129/230]
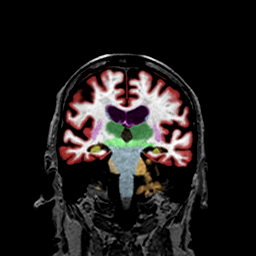
[im 144/230]
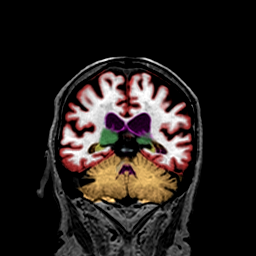
[im 158/230]
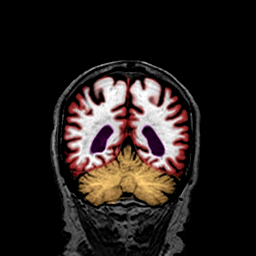
[im 172/230]
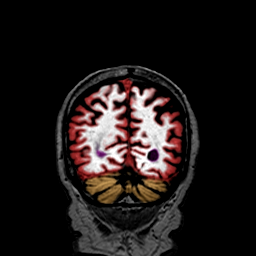
[im 187/230]
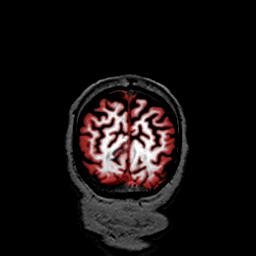
[im 201/230]
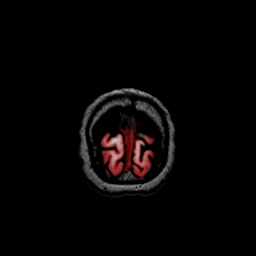
[im 215/230]
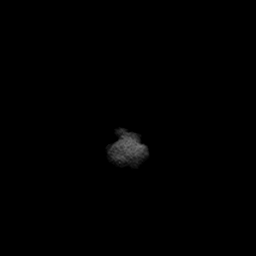
[im 230/230]
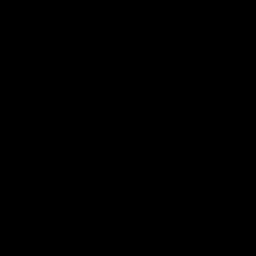

[Series 53: nqsegcb_sc_axl · 1.00mm/px · 16 of 209 slices shown]
[im 1/209]
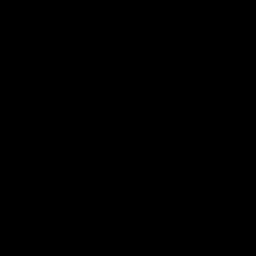
[im 14/209]
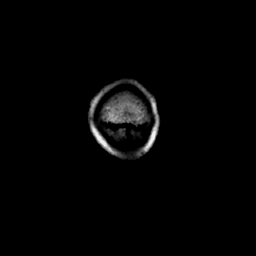
[im 28/209]
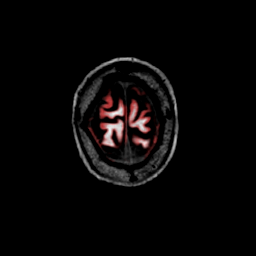
[im 42/209]
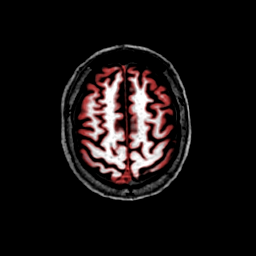
[im 56/209]
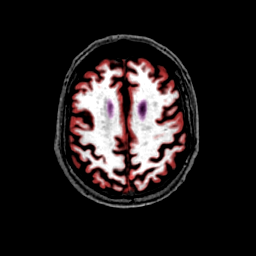
[im 70/209]
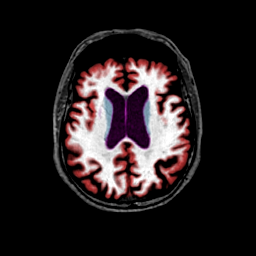
[im 84/209]
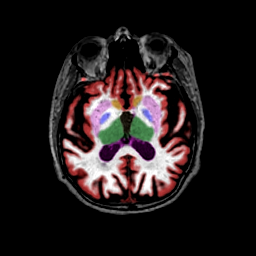
[im 98/209]
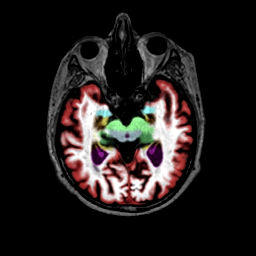
[im 111/209]
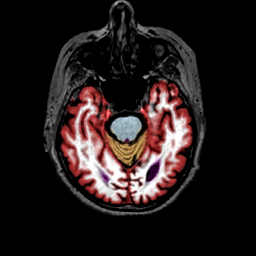
[im 125/209]
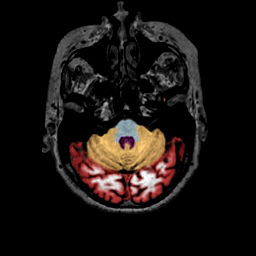
[im 139/209]
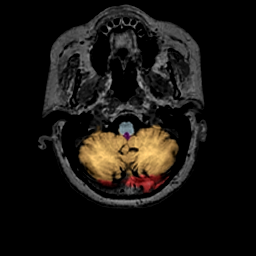
[im 153/209]
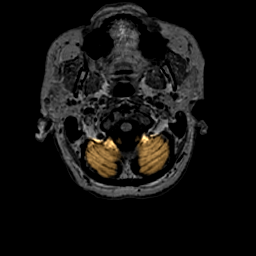
[im 167/209]
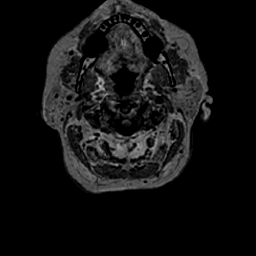
[im 181/209]
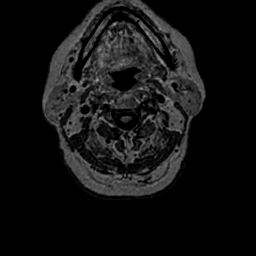
[im 195/209]
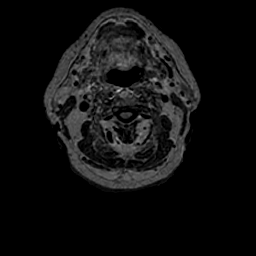
[im 209/209]
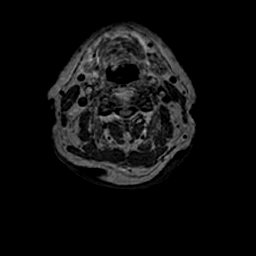

[Series 54: nqsegcb_sc_sag · 1.00mm/px · 15 of 198 slices shown]
[im 1/198]
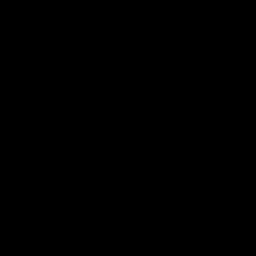
[im 15/198]
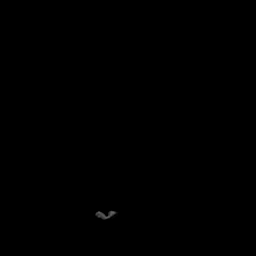
[im 29/198]
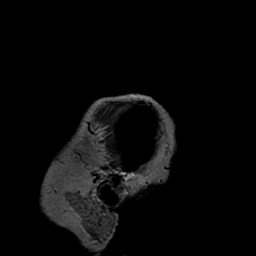
[im 43/198]
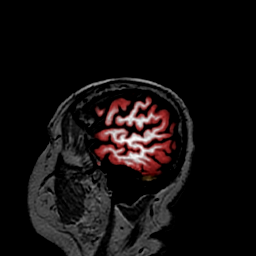
[im 57/198]
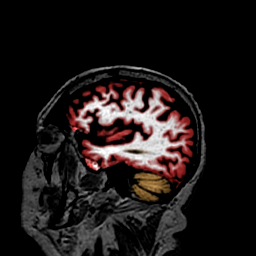
[im 71/198]
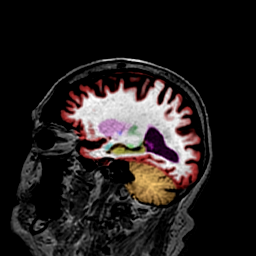
[im 85/198]
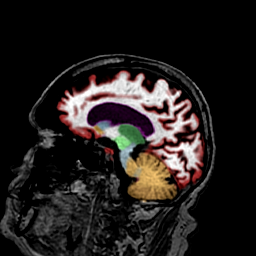
[im 99/198]
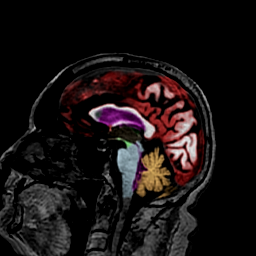
[im 113/198]
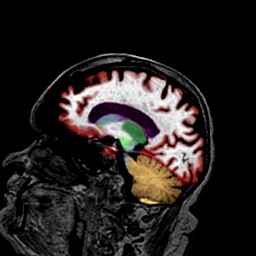
[im 127/198]
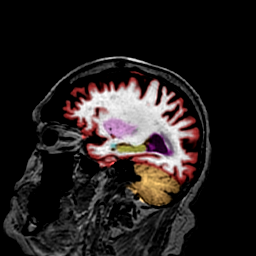
[im 141/198]
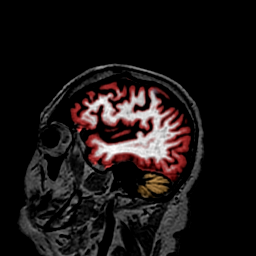
[im 155/198]
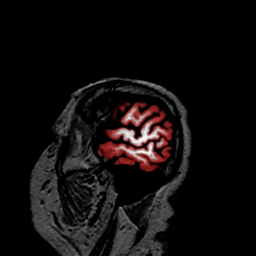
[im 169/198]
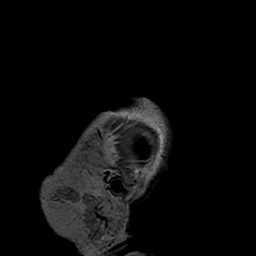
[im 183/198]
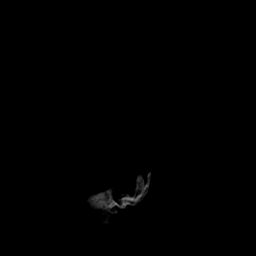
[im 198/198]
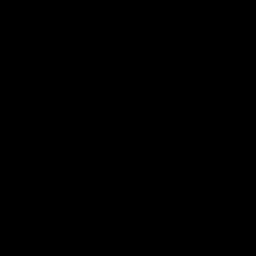

[48 of 48 positions shown; findings below may reference images not displayed]

FINDINGS: Brain: There is no evidence of acute infarct, intracranial
hemorrhage, mass, midline shift, or extra-axial fluid collection. T2
hyperintensities in the cerebral white matter and pons are stable to
minimally increased compared to the prior study and are nonspecific
but compatible with mild-to-moderate chronic small vessel ischemic
disease. Cerebral atrophy is unchanged.

Vascular: Major intracranial vascular flow voids are preserved.

Skull and upper cervical spine: Unremarkable bone marrow signal.

Sinuses/Orbits: Bilateral cataract extraction. Mild bilateral
ethmoid air cell mucosal thickening. Clear mastoid air cells.

Other: None.

NeuroQuant Findings:

Volumetric analysis of the brain was performed, with a fully
detailed report in [HOSPITAL] PACS. Briefly, the comparison with age and
gender matched reference reveals whole brain volume to be at the 2nd
percentile.
IMPRESSION: 1. No acute intracranial abnormality.
2. Mild-to-moderate chronic small vessel ischemic disease.
3. NeuroQuant volumetric analysis of the brain, see details on
[HOSPITAL] PACS.

## 2022-01-17 ENCOUNTER — Ambulatory Visit: Payer: PPO | Attending: Cardiovascular Disease

## 2022-01-17 DIAGNOSIS — I25118 Atherosclerotic heart disease of native coronary artery with other forms of angina pectoris: Secondary | ICD-10-CM | POA: Diagnosis not present

## 2022-01-17 LAB — ECHOCARDIOGRAM COMPLETE
AR max vel: 2.97 cm2
AV Area VTI: 2.87 cm2
AV Area mean vel: 3.01 cm2
AV Mean grad: 4 mmHg
AV Peak grad: 7.1 mmHg
Ao pk vel: 1.33 m/s
Area-P 1/2: 5.27 cm2
Calc EF: 60.3 %
S' Lateral: 3.7 cm
Single Plane A2C EF: 63.7 %
Single Plane A4C EF: 55 %

## 2022-01-18 ENCOUNTER — Ambulatory Visit: Payer: PPO | Admitting: Urology

## 2022-01-18 DIAGNOSIS — M25612 Stiffness of left shoulder, not elsewhere classified: Secondary | ICD-10-CM | POA: Diagnosis not present

## 2022-01-18 DIAGNOSIS — M6281 Muscle weakness (generalized): Secondary | ICD-10-CM | POA: Diagnosis not present

## 2022-01-18 DIAGNOSIS — M25512 Pain in left shoulder: Secondary | ICD-10-CM | POA: Diagnosis not present

## 2022-01-19 DIAGNOSIS — Z794 Long term (current) use of insulin: Secondary | ICD-10-CM | POA: Diagnosis not present

## 2022-01-19 DIAGNOSIS — I2510 Atherosclerotic heart disease of native coronary artery without angina pectoris: Secondary | ICD-10-CM | POA: Diagnosis not present

## 2022-01-19 DIAGNOSIS — E1142 Type 2 diabetes mellitus with diabetic polyneuropathy: Secondary | ICD-10-CM | POA: Diagnosis not present

## 2022-01-19 DIAGNOSIS — M810 Age-related osteoporosis without current pathological fracture: Secondary | ICD-10-CM | POA: Diagnosis not present

## 2022-01-19 DIAGNOSIS — I129 Hypertensive chronic kidney disease with stage 1 through stage 4 chronic kidney disease, or unspecified chronic kidney disease: Secondary | ICD-10-CM | POA: Diagnosis not present

## 2022-01-19 DIAGNOSIS — Z87891 Personal history of nicotine dependence: Secondary | ICD-10-CM | POA: Diagnosis not present

## 2022-01-19 DIAGNOSIS — N183 Chronic kidney disease, stage 3 unspecified: Secondary | ICD-10-CM | POA: Diagnosis not present

## 2022-01-19 DIAGNOSIS — Z9181 History of falling: Secondary | ICD-10-CM | POA: Diagnosis not present

## 2022-01-19 DIAGNOSIS — F32A Depression, unspecified: Secondary | ICD-10-CM | POA: Diagnosis not present

## 2022-01-19 DIAGNOSIS — N39498 Other specified urinary incontinence: Secondary | ICD-10-CM | POA: Diagnosis not present

## 2022-01-19 DIAGNOSIS — E669 Obesity, unspecified: Secondary | ICD-10-CM | POA: Diagnosis not present

## 2022-01-19 DIAGNOSIS — Z6831 Body mass index (BMI) 31.0-31.9, adult: Secondary | ICD-10-CM | POA: Diagnosis not present

## 2022-01-19 DIAGNOSIS — F419 Anxiety disorder, unspecified: Secondary | ICD-10-CM | POA: Diagnosis not present

## 2022-01-19 DIAGNOSIS — N401 Enlarged prostate with lower urinary tract symptoms: Secondary | ICD-10-CM | POA: Diagnosis not present

## 2022-01-19 DIAGNOSIS — D631 Anemia in chronic kidney disease: Secondary | ICD-10-CM | POA: Diagnosis not present

## 2022-01-19 DIAGNOSIS — E113599 Type 2 diabetes mellitus with proliferative diabetic retinopathy without macular edema, unspecified eye: Secondary | ICD-10-CM | POA: Diagnosis not present

## 2022-01-19 DIAGNOSIS — E7800 Pure hypercholesterolemia, unspecified: Secondary | ICD-10-CM | POA: Diagnosis not present

## 2022-01-19 DIAGNOSIS — Z7984 Long term (current) use of oral hypoglycemic drugs: Secondary | ICD-10-CM | POA: Diagnosis not present

## 2022-01-19 DIAGNOSIS — E1122 Type 2 diabetes mellitus with diabetic chronic kidney disease: Secondary | ICD-10-CM | POA: Diagnosis not present

## 2022-01-20 DIAGNOSIS — M25512 Pain in left shoulder: Secondary | ICD-10-CM | POA: Diagnosis not present

## 2022-01-20 DIAGNOSIS — M6281 Muscle weakness (generalized): Secondary | ICD-10-CM | POA: Diagnosis not present

## 2022-01-20 DIAGNOSIS — M25612 Stiffness of left shoulder, not elsewhere classified: Secondary | ICD-10-CM | POA: Diagnosis not present

## 2022-01-23 DIAGNOSIS — E1122 Type 2 diabetes mellitus with diabetic chronic kidney disease: Secondary | ICD-10-CM | POA: Diagnosis not present

## 2022-01-23 DIAGNOSIS — I129 Hypertensive chronic kidney disease with stage 1 through stage 4 chronic kidney disease, or unspecified chronic kidney disease: Secondary | ICD-10-CM | POA: Diagnosis not present

## 2022-01-23 DIAGNOSIS — F419 Anxiety disorder, unspecified: Secondary | ICD-10-CM | POA: Diagnosis not present

## 2022-01-23 DIAGNOSIS — N183 Chronic kidney disease, stage 3 unspecified: Secondary | ICD-10-CM | POA: Diagnosis not present

## 2022-01-23 DIAGNOSIS — E113599 Type 2 diabetes mellitus with proliferative diabetic retinopathy without macular edema, unspecified eye: Secondary | ICD-10-CM | POA: Diagnosis not present

## 2022-01-23 DIAGNOSIS — E1142 Type 2 diabetes mellitus with diabetic polyneuropathy: Secondary | ICD-10-CM | POA: Diagnosis not present

## 2022-01-23 DIAGNOSIS — D631 Anemia in chronic kidney disease: Secondary | ICD-10-CM | POA: Diagnosis not present

## 2022-01-26 DIAGNOSIS — E039 Hypothyroidism, unspecified: Secondary | ICD-10-CM | POA: Diagnosis not present

## 2022-01-26 DIAGNOSIS — E1159 Type 2 diabetes mellitus with other circulatory complications: Secondary | ICD-10-CM | POA: Diagnosis not present

## 2022-01-26 DIAGNOSIS — Z794 Long term (current) use of insulin: Secondary | ICD-10-CM | POA: Diagnosis not present

## 2022-01-26 DIAGNOSIS — I152 Hypertension secondary to endocrine disorders: Secondary | ICD-10-CM | POA: Diagnosis not present

## 2022-01-26 DIAGNOSIS — E1169 Type 2 diabetes mellitus with other specified complication: Secondary | ICD-10-CM | POA: Diagnosis not present

## 2022-01-26 DIAGNOSIS — E785 Hyperlipidemia, unspecified: Secondary | ICD-10-CM | POA: Diagnosis not present

## 2022-01-26 DIAGNOSIS — E1142 Type 2 diabetes mellitus with diabetic polyneuropathy: Secondary | ICD-10-CM | POA: Diagnosis not present

## 2022-01-27 ENCOUNTER — Ambulatory Visit: Payer: PPO | Admitting: Physician Assistant

## 2022-01-27 VITALS — BP 152/75 | HR 55 | Wt 197.4 lb

## 2022-01-27 DIAGNOSIS — R300 Dysuria: Secondary | ICD-10-CM | POA: Diagnosis not present

## 2022-01-27 DIAGNOSIS — R350 Frequency of micturition: Secondary | ICD-10-CM | POA: Diagnosis not present

## 2022-01-27 LAB — MICROSCOPIC EXAMINATION

## 2022-01-27 LAB — URINALYSIS, COMPLETE
Bilirubin, UA: NEGATIVE
Glucose, UA: NEGATIVE
Ketones, UA: NEGATIVE
Leukocytes,UA: NEGATIVE
Nitrite, UA: NEGATIVE
RBC, UA: NEGATIVE
Specific Gravity, UA: 1.02 (ref 1.005–1.030)
Urobilinogen, Ur: 0.2 mg/dL (ref 0.2–1.0)
pH, UA: 5.5 (ref 5.0–7.5)

## 2022-01-27 LAB — BLADDER SCAN AMB NON-IMAGING: Scan Result: 43

## 2022-01-27 MED ORDER — MIRABEGRON ER 50 MG PO TB24: 50.0000 mg | ORAL_TABLET | Freq: Every day | ORAL | 11 refills | Status: DC

## 2022-01-30 DIAGNOSIS — M75122 Complete rotator cuff tear or rupture of left shoulder, not specified as traumatic: Secondary | ICD-10-CM | POA: Diagnosis not present

## 2022-01-30 NOTE — Progress Notes (Signed)
01/27/2022 12:59 PM   Ronald Wallace Nov 26, 1940 [id number]  CC: Chief Complaint  Patient presents with   Urinary Frequency   HPI: Ronald Wallace is a 82 y.o. male with PMH BPH on Flomax and finasteride with urgency, frequency, and urge incontinence previously on Gemtesa and Myrbetriq who presents today for follow-up of his urinary symptoms.  He is accompanied today by his wife, who contributes to HPI.  Today he reports constant lower abdominal pain times several months as well as progressively worsening urgency and urge incontinence.  He has nocturia x 2-3.  He reports chronic back/hip pain issues and stopped spinal injections because they were not helping.  He was previously on [place], but he is no longer taking this and is not sure when he stopped it.  He does not recall ever having been on Myrbetriq.  In-office UA today positive for 2+ protein; urine microscopy pan negative. PVR 37m.  PMH: Past Medical History:  Diagnosis Date   Anemia of chronic disease 07/01/2015   Arthritis    Benign prostatic hyperplasia with nocturia 10/30/2016   Bradycardia 03/12/2014   Bruit of left carotid artery 03/12/2014   CKD (chronic kidney disease) 09/27/2015   stage 3, GFR 30-59 ml/min   Coronary artery disease involving native coronary artery of native heart without angina pectoris    a. 02/2014 Neg Ex MV, EF 65%;  b. 06/2015 Abnl MV ([facility])-->Cath ([facility]): LM nl, LAD 30p/m, LCX 30p, OM2 50p, RCA 100p w/ L-->R collats, nl EF-->Med Rx; c. 10/2017 MV: No isch/scar; d. 11/2017 Cath: LM nl, LAD 45p/m, LCX 30ost, OM2 30, RCA 100 CTO, L->R collats. PA 31/16/22, PCWP 11; e. 05/2020 MV: EF 67%, no isch/scar->low risk.   Diabetes mellitus, type II, insulin dependent    Diabetic neuropathy    Diabetic retinopathy associated with type 2 diabetes mellitus 10/28/2018   Disorder of bone and cartilage    Diverticulitis    DOE (dyspnea on exertion) 03/12/2014   Generalized anxiety disorder    GERD  (gastroesophageal reflux disease)    Hypertension associated with diabetes    Major depressive disorder    Major neurocognitive disorder due to multiple etiologies 08/04/2020   likely Alzheimer's disease w/vascular contributions   Mixed hyperlipidemia due to type 2 diabetes mellitus 02/06/2017   Obesity    Osteoporosis    Pure hypercholesterolemia    Syncope and collapse    a. 02/2014 Carotid Dopplers: 1-39% bilateral ICA with normal Subclavian & vertebral arteries; b. 02/2014 2 D Echo: EF 50-55%, Gr 1 DD, Mild MR & LA dilation -- was bradycardic in 40s; c. 04/2014 Event monitor: RSR/SA/SB. Occas PVC's. No AFib.    Surgical History: Past Surgical History:  Procedure Laterality Date   CARDIAC CATHETERIZATION Left 07/07/2015   Procedure: Left Heart Cath and Coronary Angiography;  Surgeon: AIsaias Cowman MD;  [facility];  Service: Cardiovascular;  Laterality: Left;   CARPAL TUNNEL RELEASE     CATARACT EXTRACTION Right    CATARACT EXTRACTION W/PHACO Left 10/06/2015   Procedure: CATARACT EXTRACTION PHACO AND INTRAOCULAR LENS PLACEMENT ([place];  Surgeon: SEstill Cotta MD;  Location: [facility];  Service: Ophthalmology;  Laterality: Left;  [place]:42 AP% 25.1 CDE 45.92 Fluid pack lot # [id number]   COLONOSCOPY     COLONOSCOPY WITH PROPOFOL N/A 12/04/2018   Procedure: COLONOSCOPY WITH PROPOFOL;  Surgeon: Toledo, TBenay Pike MD;  Location: [facility];  Service: Gastroenterology;  Laterality: N/A;   NM MYOVIEW LTD  03/13/2014  LOW RISK - positive EKG but negative imaging: 2 mm ST Depression in V4 & V5 but No scintigraphic evidence of Ischemia or Infarct o   RIGHT/LEFT HEART CATH AND CORONARY ANGIOGRAPHY N/A 11/21/2017   Procedure: RIGHT/LEFT HEART CATH AND CORONARY ANGIOGRAPHY;  Surgeon: Minna Merritts, MD;  Location: [place] [facility];  Service: Cardiovascular;  Laterality: N/A;   TRANSTHORACIC ECHOCARDIOGRAM  03/12/2014   EF 50-55%, Gr 1 DD, no RWMA, Mlid MR & LA  dil, Normla RV and bradycardic 40s -    Home Medications:  Allergies as of 01/27/2022       Reactions   Amlodipine    Other reaction(s): Other (See Comments) Peripheral edema        Medication List        Accurate as of January 27, 2022 11:59 PM. If you have any questions, ask your nurse or doctor.          STOP taking these medications    Gemtesa 75 MG Tabs Generic drug: Vibegron Stopped by: Debroah Loop, PA-C       TAKE these medications    acetaminophen 325 MG tablet Commonly known as: TYLENOL Take 650 mg by mouth every 6 (six) hours as needed.   aspirin EC 81 MG tablet Take 81 mg by mouth daily.   calcium carbonate 600 MG Tabs tablet Commonly known as: OS-CAL Take 600 mg by mouth daily with breakfast.   cholecalciferol 1000 units tablet Commonly known as: VITAMIN D Take 2,000 Units by mouth daily.   cyanocobalamin 1000 MCG tablet Commonly known as: VITAMIN B12 Take 1,000 mcg by mouth daily.   donepezil 10 MG tablet Commonly known as: ARICEPT Take 1 tablet (10 mg total) by mouth daily.   escitalopram 20 MG tablet Commonly known as: Lexapro Take 1 and 1/2 tablets daily   ferrous sulfate 325 (65 FE) MG tablet Take 325 mg by mouth daily with breakfast.   gabapentin 300 MG capsule Commonly known as: NEURONTIN Take 300-600 mg by mouth See admin instructions. Take 1 capsule (300 mg) by mouth in the morning & 2 capsules (600 mg) by mouth every night.   hydrALAZINE 25 MG tablet Commonly known as: APRESOLINE Take 25 mg by mouth 3 (three) times daily.   isosorbide mononitrate 30 MG 24 hr tablet Commonly known as: IMDUR Take 1 tablet by mouth once daily   levothyroxine 50 MCG tablet Commonly known as: SYNTHROID Take 50 mcg by mouth daily before breakfast.   metFORMIN 500 MG tablet Commonly known as: GLUCOPHAGE Take 250 mg by mouth 2 (two) times daily with a meal.   mirabegron ER 50 MG Tb24 tablet Commonly known as: MYRBETRIQ Take  1 tablet (50 mg total) by mouth daily. Started by: Debroah Loop, PA-C   NovoLOG 100 UNIT/ML injection Generic drug: insulin aspart Inject 20-38 Units into the skin 3 (three) times daily with meals. Sliding scale insulin base rates= 20 units in the morning, 26 units with lunch, 26 units with supper.   simvastatin 20 MG tablet Commonly known as: ZOCOR Take 20 mg by mouth daily.   tamsulosin 0.4 MG Caps capsule Commonly known as: FLOMAX Take 0.4 mg by mouth daily. Takes 2 each morning.   traMADol 50 MG tablet Commonly known as: ULTRAM Take 25-50 mg by mouth 2 (two) times daily as needed.   Tyler Aas FlexTouch 100 UNIT/ML FlexTouch Pen Generic drug: insulin degludec SMARTSIG:25 Unit(s) SUB-Q Daily        Allergies:  Allergies  Allergen  Reactions   Amlodipine     Other reaction(s): Other (See Comments) Peripheral edema    Family History: Family History  Problem Relation Age of Onset   Heart attack Mother    Breast cancer Mother    Lung cancer Father     Social History:   reports that he quit smoking about 59 years ago. His smoking use included pipe. He has never used smokeless tobacco. He reports that he does not drink alcohol and does not use drugs.  Physical Exam: BP (!) 152/75   Pulse (!) 55   Wt 197 lb 6 oz (89.5 kg)   BMI 30.01 kg/m   Constitutional:  Alert and oriented, no acute distress, nontoxic appearing HEENT: [place], AT Cardiovascular: No clubbing, cyanosis, or edema Respiratory: Normal respiratory effort, no increased work of breathing Skin: No rashes, bruises or suspicious lesions Neurologic: Grossly intact, no focal deficits, moving all 4 extremities Psychiatric: Normal mood and affect  Laboratory Data: Results for orders placed or performed in visit on 01/27/22  Microscopic Examination   Urine  Result Value Ref Range   WBC, UA 0-5 0 - 5 /hpf   RBC, Urine 0-2 0 - 2 /hpf   Epithelial Cells (non renal) 0-10 0 - 10 /hpf   Mucus, UA Present  (A) Not Estab.   Bacteria, UA Few None seen/Few  Urinalysis, Complete  Result Value Ref Range   Specific Gravity, UA 1.020 1.005 - 1.030   pH, UA 5.5 5.0 - 7.5   Color, UA Yellow Yellow   Appearance Ur Clear Clear   Leukocytes,UA Negative Negative   Protein,UA 2+ (A) Negative/Trace   Glucose, UA Negative Negative   Ketones, UA Negative Negative   RBC, UA Negative Negative   Bilirubin, UA Negative Negative   Urobilinogen, Ur 0.2 0.2 - 1.0 mg/dL   Nitrite, UA Negative Negative   Microscopic Examination See below:   Bladder Scan (Post Void Residual) in office  Result Value Ref Range   Scan Result 43 ml    Assessment & Plan:   1. Urination frequency Progressively worsening urgency, frequency, and urge incontinence off urinary agents.  Will resume Myrbetriq and plan for symptom recheck and PVR in 4 weeks.  He is in agreement with this plan.  We discussed that with bland UA, his lower abdominal pain is unlikely to be urologic in nature and per chart review it looks like this is not a new symptom.  I feel this is more likely musculoskeletal and to due to his chronic low back pain.  An MR lumbar spine has been ordered for further evaluation of his back problems and I encouraged him to pursue this, as less likely on my differential would be an advanced prostate cancer; will defer PSA testing given age and comorbidities pending any lytic lesions seen on MRI. - Urinalysis, Complete - Bladder Scan (Post Void Residual) in office - mirabegron ER (MYRBETRIQ) 50 MG TB24 tablet; Take 1 tablet (50 mg total) by mouth daily.  Dispense: 30 tablet; Refill: 11  Return in about 4 weeks (around 02/24/2022) for Symptom recheck with PVR.  Debroah Loop, PA-C  [facility] [address], [place], [place] [address] [phone]

## 2022-02-01 DIAGNOSIS — M25552 Pain in left hip: Secondary | ICD-10-CM | POA: Diagnosis not present

## 2022-02-01 DIAGNOSIS — M1612 Unilateral primary osteoarthritis, left hip: Secondary | ICD-10-CM | POA: Diagnosis not present

## 2022-02-01 DIAGNOSIS — M545 Low back pain, unspecified: Secondary | ICD-10-CM | POA: Diagnosis not present

## 2022-02-06 DIAGNOSIS — D044 Carcinoma in situ of skin of scalp and neck: Secondary | ICD-10-CM | POA: Diagnosis not present

## 2022-02-06 DIAGNOSIS — Z85828 Personal history of other malignant neoplasm of skin: Secondary | ICD-10-CM | POA: Diagnosis not present

## 2022-02-06 DIAGNOSIS — Z85820 Personal history of malignant melanoma of skin: Secondary | ICD-10-CM | POA: Diagnosis not present

## 2022-02-06 DIAGNOSIS — Z86006 Personal history of melanoma in-situ: Secondary | ICD-10-CM | POA: Diagnosis not present

## 2022-02-06 DIAGNOSIS — D2262 Melanocytic nevi of left upper limb, including shoulder: Secondary | ICD-10-CM | POA: Diagnosis not present

## 2022-02-06 DIAGNOSIS — D2272 Melanocytic nevi of left lower limb, including hip: Secondary | ICD-10-CM | POA: Diagnosis not present

## 2022-02-06 DIAGNOSIS — X32XXXA Exposure to sunlight, initial encounter: Secondary | ICD-10-CM | POA: Diagnosis not present

## 2022-02-06 DIAGNOSIS — L570 Actinic keratosis: Secondary | ICD-10-CM | POA: Diagnosis not present

## 2022-02-06 DIAGNOSIS — D485 Neoplasm of uncertain behavior of skin: Secondary | ICD-10-CM | POA: Diagnosis not present

## 2022-02-15 DIAGNOSIS — E113599 Type 2 diabetes mellitus with proliferative diabetic retinopathy without macular edema, unspecified eye: Secondary | ICD-10-CM | POA: Diagnosis not present

## 2022-02-15 DIAGNOSIS — E669 Obesity, unspecified: Secondary | ICD-10-CM | POA: Diagnosis not present

## 2022-02-15 DIAGNOSIS — N401 Enlarged prostate with lower urinary tract symptoms: Secondary | ICD-10-CM | POA: Diagnosis not present

## 2022-02-15 DIAGNOSIS — D631 Anemia in chronic kidney disease: Secondary | ICD-10-CM | POA: Diagnosis not present

## 2022-02-15 DIAGNOSIS — Z6831 Body mass index (BMI) 31.0-31.9, adult: Secondary | ICD-10-CM | POA: Diagnosis not present

## 2022-02-15 DIAGNOSIS — N39498 Other specified urinary incontinence: Secondary | ICD-10-CM | POA: Diagnosis not present

## 2022-02-15 DIAGNOSIS — I2510 Atherosclerotic heart disease of native coronary artery without angina pectoris: Secondary | ICD-10-CM | POA: Diagnosis not present

## 2022-02-15 DIAGNOSIS — E1122 Type 2 diabetes mellitus with diabetic chronic kidney disease: Secondary | ICD-10-CM | POA: Diagnosis not present

## 2022-02-15 DIAGNOSIS — F32A Depression, unspecified: Secondary | ICD-10-CM | POA: Diagnosis not present

## 2022-02-15 DIAGNOSIS — E1142 Type 2 diabetes mellitus with diabetic polyneuropathy: Secondary | ICD-10-CM | POA: Diagnosis not present

## 2022-02-15 DIAGNOSIS — Z794 Long term (current) use of insulin: Secondary | ICD-10-CM | POA: Diagnosis not present

## 2022-02-15 DIAGNOSIS — Z87891 Personal history of nicotine dependence: Secondary | ICD-10-CM | POA: Diagnosis not present

## 2022-02-15 DIAGNOSIS — M810 Age-related osteoporosis without current pathological fracture: Secondary | ICD-10-CM | POA: Diagnosis not present

## 2022-02-15 DIAGNOSIS — Z7984 Long term (current) use of oral hypoglycemic drugs: Secondary | ICD-10-CM | POA: Diagnosis not present

## 2022-02-15 DIAGNOSIS — E7800 Pure hypercholesterolemia, unspecified: Secondary | ICD-10-CM | POA: Diagnosis not present

## 2022-02-15 DIAGNOSIS — Z9181 History of falling: Secondary | ICD-10-CM | POA: Diagnosis not present

## 2022-02-15 DIAGNOSIS — F419 Anxiety disorder, unspecified: Secondary | ICD-10-CM | POA: Diagnosis not present

## 2022-02-15 DIAGNOSIS — N183 Chronic kidney disease, stage 3 unspecified: Secondary | ICD-10-CM | POA: Diagnosis not present

## 2022-02-15 DIAGNOSIS — I129 Hypertensive chronic kidney disease with stage 1 through stage 4 chronic kidney disease, or unspecified chronic kidney disease: Secondary | ICD-10-CM | POA: Diagnosis not present

## 2022-02-21 DIAGNOSIS — Z87891 Personal history of nicotine dependence: Secondary | ICD-10-CM | POA: Diagnosis not present

## 2022-02-21 DIAGNOSIS — E113599 Type 2 diabetes mellitus with proliferative diabetic retinopathy without macular edema, unspecified eye: Secondary | ICD-10-CM | POA: Diagnosis not present

## 2022-02-21 DIAGNOSIS — F419 Anxiety disorder, unspecified: Secondary | ICD-10-CM | POA: Diagnosis not present

## 2022-02-21 DIAGNOSIS — E1142 Type 2 diabetes mellitus with diabetic polyneuropathy: Secondary | ICD-10-CM | POA: Diagnosis not present

## 2022-02-21 DIAGNOSIS — M810 Age-related osteoporosis without current pathological fracture: Secondary | ICD-10-CM | POA: Diagnosis not present

## 2022-02-21 DIAGNOSIS — E669 Obesity, unspecified: Secondary | ICD-10-CM | POA: Diagnosis not present

## 2022-02-21 DIAGNOSIS — Z9181 History of falling: Secondary | ICD-10-CM | POA: Diagnosis not present

## 2022-02-21 DIAGNOSIS — D631 Anemia in chronic kidney disease: Secondary | ICD-10-CM | POA: Diagnosis not present

## 2022-02-21 DIAGNOSIS — N39498 Other specified urinary incontinence: Secondary | ICD-10-CM | POA: Diagnosis not present

## 2022-02-21 DIAGNOSIS — I2510 Atherosclerotic heart disease of native coronary artery without angina pectoris: Secondary | ICD-10-CM | POA: Diagnosis not present

## 2022-02-21 DIAGNOSIS — Z7984 Long term (current) use of oral hypoglycemic drugs: Secondary | ICD-10-CM | POA: Diagnosis not present

## 2022-02-21 DIAGNOSIS — N401 Enlarged prostate with lower urinary tract symptoms: Secondary | ICD-10-CM | POA: Diagnosis not present

## 2022-02-21 DIAGNOSIS — N183 Chronic kidney disease, stage 3 unspecified: Secondary | ICD-10-CM | POA: Diagnosis not present

## 2022-02-21 DIAGNOSIS — I129 Hypertensive chronic kidney disease with stage 1 through stage 4 chronic kidney disease, or unspecified chronic kidney disease: Secondary | ICD-10-CM | POA: Diagnosis not present

## 2022-02-21 DIAGNOSIS — Z794 Long term (current) use of insulin: Secondary | ICD-10-CM | POA: Diagnosis not present

## 2022-02-21 DIAGNOSIS — F32A Depression, unspecified: Secondary | ICD-10-CM | POA: Diagnosis not present

## 2022-02-21 DIAGNOSIS — Z6831 Body mass index (BMI) 31.0-31.9, adult: Secondary | ICD-10-CM | POA: Diagnosis not present

## 2022-02-21 DIAGNOSIS — E7800 Pure hypercholesterolemia, unspecified: Secondary | ICD-10-CM | POA: Diagnosis not present

## 2022-02-21 DIAGNOSIS — E1122 Type 2 diabetes mellitus with diabetic chronic kidney disease: Secondary | ICD-10-CM | POA: Diagnosis not present

## 2022-02-24 ENCOUNTER — Ambulatory Visit (INDEPENDENT_AMBULATORY_CARE_PROVIDER_SITE_OTHER): Payer: PPO | Admitting: Physician Assistant

## 2022-02-24 ENCOUNTER — Encounter: Payer: Self-pay | Admitting: Physician Assistant

## 2022-02-24 VITALS — BP 143/67 | HR 61 | Ht 68.0 in | Wt 199.0 lb

## 2022-02-24 DIAGNOSIS — R350 Frequency of micturition: Secondary | ICD-10-CM | POA: Diagnosis not present

## 2022-02-24 LAB — BLADDER SCAN AMB NON-IMAGING

## 2022-02-24 MED ORDER — MIRABEGRON ER 50 MG PO TB24: 50.0000 mg | ORAL_TABLET | Freq: Every day | ORAL | 3 refills | Status: DC

## 2022-02-24 NOTE — Progress Notes (Signed)
02/24/2022 3:38 PM   Ronald Wallace August 14, 1940 [id number]  CC: Chief Complaint  Patient presents with   Follow-up   HPI: Ronald Wallace is a 82 y.o. male with PMH BPH on Flomax and finasteride with urgency, frequency, and urge incontinence who presents today for symptom recheck after resuming Myrbetriq 50 mg daily.  He is accompanied today by his wife, who contributes to HPI.  Today he reports his storage related voiding symptoms have improved after resuming Myrbetriq.  He reports daytime frequency x 3-4, nocturia x 1-2, improved urgency, and very little urge incontinence.  He continues to have some postvoid dribbling but overall is pleased.  PVR 123 mL.  PMH: Past Medical History:  Diagnosis Date   Anemia of chronic disease 07/01/2015   Arthritis    Benign prostatic hyperplasia with nocturia 10/30/2016   Bradycardia 03/12/2014   Bruit of left carotid artery 03/12/2014   CKD (chronic kidney disease) 09/27/2015   stage 3, GFR 30-59 ml/min   Coronary artery disease involving native coronary artery of native heart without angina pectoris    a. 02/2014 Neg Ex MV, EF 65%;  b. 06/2015 Abnl MV ([facility])-->Cath ([facility]): LM nl, LAD 30p/m, LCX 30p, OM2 50p, RCA 100p w/ L-->R collats, nl EF-->Med Rx; c. 10/2017 MV: No isch/scar; d. 11/2017 Cath: LM nl, LAD 45p/m, LCX 30ost, OM2 30, RCA 100 CTO, L->R collats. PA 31/16/22, PCWP 11; e. 05/2020 MV: EF 67%, no isch/scar->low risk.   Diabetes mellitus, type II, insulin dependent    Diabetic neuropathy    Diabetic retinopathy associated with type 2 diabetes mellitus 10/28/2018   Disorder of bone and cartilage    Diverticulitis    DOE (dyspnea on exertion) 03/12/2014   Generalized anxiety disorder    GERD (gastroesophageal reflux disease)    Hypertension associated with diabetes    Major depressive disorder    Major neurocognitive disorder due to multiple etiologies 08/04/2020   likely Alzheimer's disease w/vascular contributions   Mixed  hyperlipidemia due to type 2 diabetes mellitus 02/06/2017   Obesity    Osteoporosis    Pure hypercholesterolemia    Syncope and collapse    a. 02/2014 Carotid Dopplers: 1-39% bilateral ICA with normal Subclavian & vertebral arteries; b. 02/2014 2 D Echo: EF 50-55%, Gr 1 DD, Mild MR & LA dilation -- was bradycardic in 40s; c. 04/2014 Event monitor: RSR/SA/SB. Occas PVC's. No AFib.    Surgical History: Past Surgical History:  Procedure Laterality Date   CARDIAC CATHETERIZATION Left 07/07/2015   Procedure: Left Heart Cath and Coronary Angiography;  Surgeon: Isaias Cowman, MD;  Location: [place] [facility];  Service: Cardiovascular;  Laterality: Left;   CARPAL TUNNEL RELEASE     CATARACT EXTRACTION Right    CATARACT EXTRACTION W/PHACO Left 10/06/2015   Procedure: CATARACT EXTRACTION PHACO AND INTRAOCULAR LENS PLACEMENT ([place]);  Surgeon: Estill Cotta, MD;  Location: [facility];  Service: Ophthalmology;  Laterality: Left;  [place] 01:42 AP% 25.1 CDE 45.92 Fluid pack lot # [id number]   COLONOSCOPY     COLONOSCOPY WITH PROPOFOL N/A 12/04/2018   Procedure: COLONOSCOPY WITH PROPOFOL;  Surgeon: Toledo, Benay Pike, MD;  Location: [facility];  Service: Gastroenterology;  Laterality: N/A;   NM MYOVIEW LTD  03/13/2014   LOW RISK - positive EKG but negative imaging: 2 mm ST Depression in V4 & V5 but No scintigraphic evidence of Ischemia or Infarct o   RIGHT/LEFT HEART CATH AND CORONARY ANGIOGRAPHY N/A 11/21/2017   Procedure: RIGHT/LEFT HEART CATH AND  CORONARY ANGIOGRAPHY;  Surgeon: Minna Merritts, MD;  Location: [place] [facility];  Service: Cardiovascular;  Laterality: N/A;   TRANSTHORACIC ECHOCARDIOGRAM  03/12/2014   EF 50-55%, Gr 1 DD, no RWMA, Mlid MR & LA dil, Normla RV and bradycardic 40s -    Home Medications:  Allergies as of 02/24/2022       Reactions   Amlodipine    Other reaction(s): Other (See Comments) Peripheral edema        Medication List        Accurate as of February 24, 2022  3:38 PM. If you have any questions, ask your nurse or doctor.          acetaminophen 325 MG tablet Commonly known as: TYLENOL Take 650 mg by mouth every 6 (six) hours as needed.   aspirin EC 81 MG tablet Take 81 mg by mouth daily.   calcium carbonate 600 MG Tabs tablet Commonly known as: OS-CAL Take 600 mg by mouth daily with breakfast.   cholecalciferol 1000 units tablet Commonly known as: VITAMIN D Take 2,000 Units by mouth daily.   cyanocobalamin 1000 MCG tablet Commonly known as: VITAMIN B12 Take 1,000 mcg by mouth daily.   donepezil 10 MG tablet Commonly known as: ARICEPT Take 1 tablet (10 mg total) by mouth daily.   escitalopram 20 MG tablet Commonly known as: Lexapro Take 1 and 1/2 tablets daily   ferrous sulfate 325 (65 FE) MG tablet Take 325 mg by mouth daily with breakfast.   FreeStyle Libre 2 Sensor Misc USE AS DIRECTED - REPLACE EVERY 14 DAYS   gabapentin 300 MG capsule Commonly known as: NEURONTIN Take 300-600 mg by mouth See admin instructions. Take 1 capsule (300 mg) by mouth in the morning & 2 capsules (600 mg) by mouth every night.   hydrALAZINE 25 MG tablet Commonly known as: APRESOLINE Take 25 mg by mouth 3 (three) times daily.   isosorbide mononitrate 30 MG 24 hr tablet Commonly known as: IMDUR Take 1 tablet by mouth once daily   levothyroxine 50 MCG tablet Commonly known as: SYNTHROID Take 50 mcg by mouth daily before breakfast.   metFORMIN 500 MG tablet Commonly known as: GLUCOPHAGE Take 250 mg by mouth 2 (two) times daily with a meal.   mirabegron ER 50 MG Tb24 tablet Commonly known as: MYRBETRIQ Take 1 tablet (50 mg total) by mouth daily.   NovoLOG 100 UNIT/ML injection Generic drug: insulin aspart Inject 20-38 Units into the skin 3 (three) times daily with meals. Sliding scale insulin base rates= 20 units in the morning, 26 units with lunch, 26 units with supper.   simvastatin 20 MG tablet Commonly known as:  ZOCOR Take 20 mg by mouth daily.   tamsulosin 0.4 MG Caps capsule Commonly known as: FLOMAX Take 0.4 mg by mouth daily. Takes 2 each morning.   traMADol 50 MG tablet Commonly known as: ULTRAM Take 25-50 mg by mouth 2 (two) times daily as needed.   Tyler Aas FlexTouch 100 UNIT/ML FlexTouch Pen Generic drug: insulin degludec SMARTSIG:25 Unit(s) SUB-Q Daily        Allergies:  Allergies  Allergen Reactions   Amlodipine     Other reaction(s): Other (See Comments) Peripheral edema    Family History: Family History  Problem Relation Age of Onset   Heart attack Mother    Breast cancer Mother    Lung cancer Father     Social History:   reports that he quit smoking about 59 years ago.  His smoking use included pipe. He has never used smokeless tobacco. He reports that he does not drink alcohol and does not use drugs.  Physical Exam: BP (!) 143/67 (BP Location: Left Arm, Patient Position: Sitting, Cuff Size: Normal)   Pulse 61   Ht 5' 8"$  (1.727 m)   Wt 199 lb (90.3 kg)   BMI 30.26 kg/m   Constitutional:  Alert and oriented, no acute distress, nontoxic appearing HEENT: [place], AT Cardiovascular: No clubbing, cyanosis, or edema Respiratory: Normal respiratory effort, no increased work of breathing Skin: No rashes, bruises or suspicious lesions Neurologic: Grossly intact, no focal deficits, moving all 4 extremities Psychiatric: Normal mood and affect  Laboratory Data: Results for orders placed or performed in visit on 02/24/22  Bladder Scan (Post Void Residual) in office  Result Value Ref Range   Scan Result 156m    Assessment & Plan:   1. Urination frequency Storage related voiding symptoms improved after resuming Myrbetriq 50 mg daily.  Will plan to continue this.  Encouraged him to continue finasteride and Flomax as well.  Will see him back next year for annual follow-up.  He is in agreement with this plan. - Bladder Scan (Post Void Residual) in office - mirabegron ER  (MYRBETRIQ) 50 MG TB24 tablet; Take 1 tablet (50 mg total) by mouth daily.  Dispense: 90 tablet; Refill: 3  Return in about 1 year (around 02/25/2023) for 170yrollow up IPSS & PVR .  SaDebroah LoopPA-C  [facility] [address] [phone]

## 2022-03-06 DIAGNOSIS — D044 Carcinoma in situ of skin of scalp and neck: Secondary | ICD-10-CM | POA: Diagnosis not present

## 2022-03-15 ENCOUNTER — Encounter: Payer: Self-pay | Admitting: Physician Assistant

## 2022-03-15 DIAGNOSIS — E669 Obesity, unspecified: Secondary | ICD-10-CM | POA: Diagnosis not present

## 2022-03-15 DIAGNOSIS — E113599 Type 2 diabetes mellitus with proliferative diabetic retinopathy without macular edema, unspecified eye: Secondary | ICD-10-CM | POA: Diagnosis not present

## 2022-03-15 DIAGNOSIS — N39498 Other specified urinary incontinence: Secondary | ICD-10-CM | POA: Diagnosis not present

## 2022-03-15 DIAGNOSIS — F419 Anxiety disorder, unspecified: Secondary | ICD-10-CM | POA: Diagnosis not present

## 2022-03-15 DIAGNOSIS — E7800 Pure hypercholesterolemia, unspecified: Secondary | ICD-10-CM | POA: Diagnosis not present

## 2022-03-15 DIAGNOSIS — Z794 Long term (current) use of insulin: Secondary | ICD-10-CM | POA: Diagnosis not present

## 2022-03-15 DIAGNOSIS — I129 Hypertensive chronic kidney disease with stage 1 through stage 4 chronic kidney disease, or unspecified chronic kidney disease: Secondary | ICD-10-CM | POA: Diagnosis not present

## 2022-03-15 DIAGNOSIS — D631 Anemia in chronic kidney disease: Secondary | ICD-10-CM | POA: Diagnosis not present

## 2022-03-15 DIAGNOSIS — Z6831 Body mass index (BMI) 31.0-31.9, adult: Secondary | ICD-10-CM | POA: Diagnosis not present

## 2022-03-15 DIAGNOSIS — E1122 Type 2 diabetes mellitus with diabetic chronic kidney disease: Secondary | ICD-10-CM | POA: Diagnosis not present

## 2022-03-15 DIAGNOSIS — I2510 Atherosclerotic heart disease of native coronary artery without angina pectoris: Secondary | ICD-10-CM | POA: Diagnosis not present

## 2022-03-15 DIAGNOSIS — M810 Age-related osteoporosis without current pathological fracture: Secondary | ICD-10-CM | POA: Diagnosis not present

## 2022-03-15 DIAGNOSIS — F32A Depression, unspecified: Secondary | ICD-10-CM | POA: Diagnosis not present

## 2022-03-15 DIAGNOSIS — Z87891 Personal history of nicotine dependence: Secondary | ICD-10-CM | POA: Diagnosis not present

## 2022-03-15 DIAGNOSIS — Z9181 History of falling: Secondary | ICD-10-CM | POA: Diagnosis not present

## 2022-03-15 DIAGNOSIS — Z7984 Long term (current) use of oral hypoglycemic drugs: Secondary | ICD-10-CM | POA: Diagnosis not present

## 2022-03-15 DIAGNOSIS — N401 Enlarged prostate with lower urinary tract symptoms: Secondary | ICD-10-CM | POA: Diagnosis not present

## 2022-03-15 DIAGNOSIS — N183 Chronic kidney disease, stage 3 unspecified: Secondary | ICD-10-CM | POA: Diagnosis not present

## 2022-03-15 DIAGNOSIS — E1142 Type 2 diabetes mellitus with diabetic polyneuropathy: Secondary | ICD-10-CM | POA: Diagnosis not present

## 2022-03-20 NOTE — Progress Notes (Unsigned)
Cardiology Office Note  Date:  03/21/2022   ID:  Ronald Wallace, DOB [birth year], MRN [id number]  PCP:  [name], MD   No chief complaint on file.   HPI:  [name] is a 82 y/o male with a prior h/o  syncope,  HTN,  HL,   DM, remote smoking, pipe, , no cigarettes increasing dyspnea and underwent stress testing @ [facility], which was abnl,   CAD cath in late June 2017 revealing moderate LAD and LCX/OM dzs with a total occlusion of the prox RCA,  L  R collaterals.   LV fxn was nl,  who presents for routine follow-up of his coronary artery disease,  chronic shortness of breath  Last seen in clinic by myself December 2023 Difficulty getting into the office today As he was checking in, legs weak, arms shaking coming into office, needed wheelchair Glu 156, blood pressure stable, heart rhythm stable Wife reports sugars low at home, recently seen by endocrine, changed tresiba dose 27 to 25  Long history of occasional dizzy spells,  legs chronically weak,, "done with PT" "PT helped the legs" Has a walker, no regular exercise program, very sedentary per the wife Does not go out much Sedentary last >2 years no regular walking or exercise program chronic fatigue  Takes isosorbide in the Am Reports he is not taking hydralazine which was on his list  Labs reviewed A1C 9.6 CR 1.5, stable Drinks lots of water HBG 11.3  Echocardiogram January 2024  Normal left and right ventricular size and function No significant valvular heart disease, normal pressures measured  Event monitor Normal rhythm with short runs of tachycardia, these were not triggered for symptoms Patient triggered symptoms associated with normal rhythm  Myoview 05/2020 Normal pharmacologic myocardial perfusion stress test without significant ischemia or scar. This is a low risk study.   EKG personally reviewed by myself on todays visit Shows Normal sinus rhythm with rate 61 bpm no  significant ST or T-wave changes  Other past medical history reviewed cardiac catheterization November 2019 for shortness of breath  right and left heart cath   Right heart pressures were within normal range RA: mean 11, RV 39/10/15, PA: 31/16/22, Wedge: 11  Mild to moderate mid LAD disease, mild proximal and mid LCX disease, Occluded proximal RCA with left to right collaterals Normal right heart pressures Prox LAD to Mid LAD lesion is 45% stenosed. 2nd Mrg lesion is 30% stenosed. Ost Cx to Prox Cx lesion is 30% stenosed. Prox RCA to Dist RCA lesion is 100% stenosed.     other past medical history reviewed  catheterization  June 2017 discussed with him, 30% LAD disease, occluded RCA with collaterals  02/2014 Carotid Dopplers: 1-39% bilateral ICA with normal Subclavian & vertebral arteries; b. 02/2014 2 D Echo: EF 50-55%, Gr 1 DD, Mild MR & LA dilation -- was bradycardic in 40s  02/2014 Neg Ex MV, EF 65%;  b. 06/2015 Abnl MV ([facility])-->Cath ([facility]): LM nl, LAD 30p/m, LCX 30p, OM2 50p, RCA 100p w/ L-->R collats, nl EF-->Med Rx.    PMH:   has a past medical history of Anemia of chronic disease (07/01/2015), Arthritis, Benign prostatic hyperplasia with nocturia (10/30/2016), Bradycardia (03/12/2014), Bruit of left carotid artery (03/12/2014), CKD (chronic kidney disease) (09/27/2015), Coronary artery disease involving native coronary artery of native heart without angina pectoris, Diabetes mellitus, type II, insulin dependent, Diabetic neuropathy, Diabetic retinopathy associated with type 2 diabetes mellitus (10/28/2018), Disorder of bone and cartilage, Diverticulitis, DOE (  dyspnea on exertion) (03/12/2014), Generalized anxiety disorder, GERD (gastroesophageal reflux disease), Hypertension associated with diabetes, Major depressive disorder, Major neurocognitive disorder due to multiple etiologies (08/04/2020), Mixed hyperlipidemia due to type 2 diabetes mellitus (02/06/2017), Obesity,  Osteoporosis, Pure hypercholesterolemia, and Syncope and collapse.  PSH:    Past Surgical History:  Procedure Laterality Date   CARDIAC CATHETERIZATION Left 07/07/2015   Procedure: Left Heart Cath and Coronary Angiography;  Surgeon: Isaias Cowman, MD;  Location: [place] [facility];  Service: Cardiovascular;  Laterality: Left;   CARPAL TUNNEL RELEASE     CATARACT EXTRACTION Right    CATARACT EXTRACTION W/PHACO Left 10/06/2015   Procedure: CATARACT EXTRACTION PHACO AND INTRAOCULAR LENS PLACEMENT ([place]);  Surgeon: Estill Cotta, MD;  Location: [facility];  Service: Ophthalmology;  Laterality: Left;  [place] 01:42 AP% 25.1 CDE 45.92 Fluid pack lot # [id number]   COLONOSCOPY     COLONOSCOPY WITH PROPOFOL N/A 12/04/2018   Procedure: COLONOSCOPY WITH PROPOFOL;  Surgeon: Toledo, Benay Pike, MD;  Location: [facility];  Service: Gastroenterology;  Laterality: N/A;   NM MYOVIEW LTD  03/13/2014   LOW RISK - positive EKG but negative imaging: 2 mm ST Depression in V4 & V5 but No scintigraphic evidence of Ischemia or Infarct o   RIGHT/LEFT HEART CATH AND CORONARY ANGIOGRAPHY N/A 11/21/2017   Procedure: RIGHT/LEFT HEART CATH AND CORONARY ANGIOGRAPHY;  Surgeon: Minna Merritts, MD;  Location: [place] [facility];  Service: Cardiovascular;  Laterality: N/A;   TRANSTHORACIC ECHOCARDIOGRAM  03/12/2014   EF 50-55%, Gr 1 DD, no RWMA, Mlid MR & LA dil, Normla RV and bradycardic 40s -    Current Outpatient Medications  Medication Sig Dispense Refill   acetaminophen (TYLENOL) 325 MG tablet Take 650 mg by mouth every 6 (six) hours as needed.     aspirin EC 81 MG tablet Take 81 mg by mouth daily.     calcium carbonate (OS-CAL) 600 MG TABS tablet Take 600 mg by mouth daily with breakfast.     cholecalciferol (VITAMIN D) 1000 UNITS tablet Take 2,000 Units by mouth daily.      Continuous Blood Gluc Sensor (FREESTYLE LIBRE 2 SENSOR) MISC USE AS DIRECTED - REPLACE EVERY 14 DAYS     cyanocobalamin (VITAMIN B12)  1000 MCG tablet Take 1,000 mcg by mouth daily.     donepezil (ARICEPT) 10 MG tablet Take 1 tablet (10 mg total) by mouth daily. 90 tablet 3   escitalopram (LEXAPRO) 20 MG tablet Take 1 and 1/2 tablets daily 135 tablet 3   ferrous sulfate 325 (65 FE) MG tablet Take 325 mg by mouth daily with breakfast.     gabapentin (NEURONTIN) 300 MG capsule Take 300-600 mg by mouth See admin instructions. Take 1 capsule (300 mg) by mouth in the morning & 2 capsules (600 mg) by mouth every night.     isosorbide mononitrate (IMDUR) 30 MG 24 hr tablet Take 1 tablet by mouth once daily 90 tablet 1   levothyroxine (SYNTHROID) 50 MCG tablet Take 50 mcg by mouth daily before breakfast.     metFORMIN (GLUCOPHAGE) 500 MG tablet Take 250 mg by mouth 2 (two) times daily with a meal.     mirabegron ER (MYRBETRIQ) 50 MG TB24 tablet Take 1 tablet (50 mg total) by mouth daily. 90 tablet 3   NOVOLOG 100 UNIT/ML injection Inject 20-38 Units into the skin 3 (three) times daily with meals. Sliding scale insulin base rates= 20 units in the morning, 26 units with lunch, 26 units  with supper.     simvastatin (ZOCOR) 20 MG tablet Take 20 mg by mouth daily.     tamsulosin (FLOMAX) 0.4 MG CAPS capsule Take 0.4 mg by mouth daily. Takes 2 each morning.     traMADol (ULTRAM) 50 MG tablet Take 25-50 mg by mouth 2 (two) times daily as needed.     TRESIBA FLEXTOUCH 100 UNIT/ML FlexTouch Pen SMARTSIG:25 Unit(s) SUB-Q Daily     No current facility-administered medications for this visit.     Allergies:   Amlodipine   Social History:  The patient  reports that he quit smoking about 59 years ago. His smoking use included pipe. He has never used smokeless tobacco. He reports that he does not drink alcohol and does not use drugs.   Family History:   family history includes Breast cancer in his mother; Heart attack in his mother; Lung cancer in his father.    Review of Systems: Review of Systems  Constitutional: Negative.   Respiratory:   Positive for shortness of breath.   Cardiovascular: Negative.   Gastrointestinal: Negative.   Musculoskeletal: Negative.   Neurological: Negative.   Psychiatric/Behavioral: Negative.    All other systems reviewed and are negative.  PHYSICAL EXAM: VS:  BP (!) 140/70 (BP Location: Left Arm)   Pulse 62   Ht '5\' 4"'$  (1.626 m)   Wt 200 lb (90.7 kg)   SpO2 93%   BMI 34.33 kg/m  , BMI Body mass index is 34.33 kg/m. Constitutional:  oriented to person, place, and time. No distress.  HENT:  Head: Grossly normal Eyes:  no discharge. No scleral icterus.  Neck: No JVD, no carotid bruits  Cardiovascular: Regular rate and rhythm, no murmurs appreciated Pulmonary/Chest: Clear to auscultation bilaterally, no wheezes or rails Abdominal: Soft.  no distension.  no tenderness.  Musculoskeletal: Normal range of motion Neurological:  normal muscle tone. Coordination normal. No atrophy Skin: Skin warm and dry Psychiatric: normal affect, pleasant  Recent Labs: No results found for requested labs within last 365 days.    Lipid Panel No results found for: "CHOL", "HDL", "LDLCALC", "TRIG"    Wt Readings from Last 3 Encounters:  03/21/22 200 lb (90.7 kg)  02/24/22 199 lb (90.3 kg)  01/27/22 197 lb 6 oz (89.5 kg)      ASSESSMENT AND PLAN:  Coronary artery disease with stable angina catheterization November 2019 showing no significant change in his disease RCA occluded with collaterals from left to right -Underwent stress test May 2022 with no ischemia Reports sugars running lower, cholesterol at goal total cholesterol 103  Essential hypertension -  Blood pressure is well controlled on today's visit. No changes made to the medications.We will move isosorbide to the evening  Mixed hyperlipidemia -  Cholesterol is at goal on the current lipid regimen. No changes to the medications were made.  Diabetes mellitus, type II, insulin dependent ([place]) - Plan: EKG 12-Lead Very sedentary, poor  diet A1c 9.6 followed by endocrine on insulin Tresiba Reports sugars have improved  DOE (dyspnea on exertion) -  Chronic shortness of breath likely secondary to chronic stable angina, deconditioning Stress test no ischemia, normal echocardiogram Suspect tremendous deconditioning  Chronic renal impairment, stage 3 (moderate)  In the setting of diabetes Not on Lasix secondary to chronic renal insufficiency creatinine 1.5 Renal function stable  Weakness Major issue, shaking coming into the office, inability to stand, no apparent reason Sugars normal, blood pressure stable, heart rate normal, oxygenation controlled Completed PT, now doing no exercise  Strongly recommended he do his exercises daily   Total encounter time more than 40 minutes  Greater than 50% was spent in counseling and coordination of care with the patient    No orders of the defined types were placed in this encounter.    Signed, Esmond Plants, M.D., Ph.D. 03/21/2022  [place], [place]

## 2022-03-21 ENCOUNTER — Encounter: Payer: Self-pay | Admitting: Cardiovascular Disease

## 2022-03-21 ENCOUNTER — Ambulatory Visit: Payer: PPO | Attending: Cardiovascular Disease | Admitting: Cardiovascular Disease

## 2022-03-21 VITALS — BP 140/70 | HR 62 | Ht 64.0 in | Wt 200.0 lb

## 2022-03-21 DIAGNOSIS — N183 Chronic kidney disease, stage 3 unspecified: Secondary | ICD-10-CM | POA: Diagnosis not present

## 2022-03-21 DIAGNOSIS — E119 Type 2 diabetes mellitus without complications: Secondary | ICD-10-CM

## 2022-03-21 DIAGNOSIS — I10 Essential (primary) hypertension: Secondary | ICD-10-CM | POA: Diagnosis not present

## 2022-03-21 DIAGNOSIS — E785 Hyperlipidemia, unspecified: Secondary | ICD-10-CM | POA: Diagnosis not present

## 2022-03-21 DIAGNOSIS — Z794 Long term (current) use of insulin: Secondary | ICD-10-CM

## 2022-03-21 DIAGNOSIS — I25118 Atherosclerotic heart disease of native coronary artery with other forms of angina pectoris: Secondary | ICD-10-CM | POA: Diagnosis not present

## 2022-03-21 DIAGNOSIS — R001 Bradycardia, unspecified: Secondary | ICD-10-CM | POA: Diagnosis not present

## 2022-03-21 DIAGNOSIS — R0609 Other forms of dyspnea: Secondary | ICD-10-CM

## 2022-03-21 DIAGNOSIS — I5032 Chronic diastolic (congestive) heart failure: Secondary | ICD-10-CM | POA: Diagnosis not present

## 2022-03-21 NOTE — Patient Instructions (Signed)
Medication Instructions:  Please move the isosorbide to dinner time  If you need a refill on your cardiac medications before your next appointment, please call your pharmacy.   Lab work: No new labs needed  Testing/Procedures: No new testing needed  Follow-Up: At [facility], you and your health needs are our priority.  As part of our continuing mission to provide you with exceptional heart care, we have created designated Provider Care Teams.  These Care Teams include your primary Cardiologist (physician) and Advanced Practice Providers (APPs -  Physician Assistants and Nurse Practitioners) who all work together to provide you with the care you need, when you need it.  You will need a follow up appointment in 12 months  Providers on your designated Care Team:   Murray Hodgkins, NP Christell Faith, PA-C Cadence Kathlen Mody, [place]  COVID-19 Vaccine Information can be found at: [email or web address] For questions related to vaccine distribution or appointments, please email [email or web address] or call [phone].

## 2022-03-22 NOTE — Addendum Note (Signed)
Addended by: Michel Santee on: 03/22/2022 01:47 PM   Modules accepted: Orders

## 2022-03-24 ENCOUNTER — Ambulatory Visit: Payer: HMO | Admitting: Physician Assistant

## 2022-03-24 DIAGNOSIS — M75122 Complete rotator cuff tear or rupture of left shoulder, not specified as traumatic: Secondary | ICD-10-CM | POA: Diagnosis not present

## 2022-03-28 DIAGNOSIS — M75122 Complete rotator cuff tear or rupture of left shoulder, not specified as traumatic: Secondary | ICD-10-CM | POA: Diagnosis not present

## 2022-03-30 DIAGNOSIS — E1165 Type 2 diabetes mellitus with hyperglycemia: Secondary | ICD-10-CM | POA: Diagnosis not present

## 2022-03-30 DIAGNOSIS — D692 Other nonthrombocytopenic purpura: Secondary | ICD-10-CM | POA: Diagnosis not present

## 2022-03-30 DIAGNOSIS — I25118 Atherosclerotic heart disease of native coronary artery with other forms of angina pectoris: Secondary | ICD-10-CM | POA: Diagnosis not present

## 2022-03-30 DIAGNOSIS — N183 Chronic kidney disease, stage 3 unspecified: Secondary | ICD-10-CM | POA: Diagnosis not present

## 2022-03-30 DIAGNOSIS — F3341 Major depressive disorder, recurrent, in partial remission: Secondary | ICD-10-CM | POA: Diagnosis not present

## 2022-03-30 DIAGNOSIS — F0390 Unspecified dementia without behavioral disturbance: Secondary | ICD-10-CM | POA: Diagnosis not present

## 2022-03-30 DIAGNOSIS — E1159 Type 2 diabetes mellitus with other circulatory complications: Secondary | ICD-10-CM | POA: Diagnosis not present

## 2022-03-30 DIAGNOSIS — E1122 Type 2 diabetes mellitus with diabetic chronic kidney disease: Secondary | ICD-10-CM | POA: Diagnosis not present

## 2022-03-30 DIAGNOSIS — E1151 Type 2 diabetes mellitus with diabetic peripheral angiopathy without gangrene: Secondary | ICD-10-CM | POA: Diagnosis not present

## 2022-03-30 DIAGNOSIS — Z794 Long term (current) use of insulin: Secondary | ICD-10-CM | POA: Diagnosis not present

## 2022-03-30 DIAGNOSIS — I10 Essential (primary) hypertension: Secondary | ICD-10-CM | POA: Diagnosis not present

## 2022-03-30 DIAGNOSIS — N2581 Secondary hyperparathyroidism of renal origin: Secondary | ICD-10-CM | POA: Diagnosis not present

## 2022-04-03 DIAGNOSIS — M75122 Complete rotator cuff tear or rupture of left shoulder, not specified as traumatic: Secondary | ICD-10-CM | POA: Diagnosis not present

## 2022-04-05 DIAGNOSIS — M75122 Complete rotator cuff tear or rupture of left shoulder, not specified as traumatic: Secondary | ICD-10-CM | POA: Diagnosis not present

## 2022-04-10 DIAGNOSIS — M75122 Complete rotator cuff tear or rupture of left shoulder, not specified as traumatic: Secondary | ICD-10-CM | POA: Diagnosis not present

## 2022-04-12 DIAGNOSIS — M75122 Complete rotator cuff tear or rupture of left shoulder, not specified as traumatic: Secondary | ICD-10-CM | POA: Diagnosis not present

## 2022-04-17 ENCOUNTER — Ambulatory Visit: Payer: HMO | Admitting: Physician Assistant

## 2022-04-25 ENCOUNTER — Ambulatory Visit: Payer: PPO | Admitting: Neurology

## 2022-04-25 ENCOUNTER — Encounter: Payer: Self-pay | Admitting: Neurology

## 2022-04-25 VITALS — BP 145/72 | HR 62 | Ht 64.0 in | Wt 204.6 lb

## 2022-04-25 DIAGNOSIS — G309 Alzheimer's disease, unspecified: Secondary | ICD-10-CM

## 2022-04-25 DIAGNOSIS — F02818 Dementia in other diseases classified elsewhere, unspecified severity, with other behavioral disturbance: Secondary | ICD-10-CM

## 2022-04-25 DIAGNOSIS — F01518 Vascular dementia, unspecified severity, with other behavioral disturbance: Secondary | ICD-10-CM

## 2022-04-25 DIAGNOSIS — F411 Generalized anxiety disorder: Secondary | ICD-10-CM

## 2022-04-25 MED ORDER — ESCITALOPRAM OXALATE 20 MG PO TABS: ORAL_TABLET | ORAL | 3 refills | Status: DC

## 2022-04-25 MED ORDER — DONEPEZIL HCL 10 MG PO TABS: 10.0000 mg | ORAL_TABLET | Freq: Every day | ORAL | 3 refills | Status: DC

## 2022-04-25 NOTE — Patient Instructions (Signed)
Good to see you. Continue all your medications. Follow-up as scheduled in September, call for any changes.   FALL PRECAUTIONS: Be cautious when walking. Scan the area for obstacles that may increase the risk of trips and falls. When getting up in the mornings, sit up at the edge of the bed for a few minutes before getting out of bed. Consider elevating the bed at the head end to avoid drop of blood pressure when getting up. Walk always in a well-lit room (use night lights in the walls). Avoid area rugs or power cords from appliances in the middle of the walkways. Use a walker or a cane if necessary and consider physical therapy for balance exercise. Get your eyesight checked regularly.  HOME SAFETY: Consider the safety of the kitchen when operating appliances like stoves, microwave oven, and blender. Consider having supervision and share cooking responsibilities until no longer able to participate in those. Accidents with firearms and other hazards in the house should be identified and addressed as well.  ABILITY TO BE LEFT ALONE: If patient is unable to contact 911 operator, consider using LifeLine, or when the need is there, arrange for someone to stay with patients. Smoking is a fire hazard, consider supervision or cessation. Risk of wandering should be assessed by caregiver and if detected at any point, supervision and safe proof recommendations should be instituted.  MEDICATION SUPERVISION: Inability to self-administer medication needs to be constantly addressed. Implement a mechanism to ensure safe administration of the medications.  RECOMMENDATIONS FOR ALL PATIENTS WITH MEMORY PROBLEMS: 1. Continue to exercise (Recommend 30 minutes of walking everyday, or 3 hours every week) 2. Increase social interactions - continue going to [place] and enjoy social gatherings with friends and family 3. Eat healthy, avoid fried foods and eat more fruits and vegetables 4. Maintain adequate blood pressure, blood  sugar, and blood cholesterol level. Reducing the risk of stroke and cardiovascular disease also helps promoting better memory. 5. Avoid stressful situations. Live a simple life and avoid aggravations. Organize your time and prepare for the next day in anticipation. 6. Sleep well, avoid any interruptions of sleep and avoid any distractions in the bedroom that may interfere with adequate sleep quality 7. Avoid sugar, avoid sweets as there is a strong link between excessive sugar intake, diabetes, and cognitive impairment The Mediterranean diet has been shown to help patients reduce the risk of progressive memory disorders and reduces cardiovascular risk. This includes eating fish, eat fruits and green leafy vegetables, nuts like almonds and hazelnuts, walnuts, and also use olive oil. Avoid fast foods and fried foods as much as possible. Avoid sweets and sugar as sugar use has been linked to worsening of memory function.  There is always a concern of gradual progression of memory problems. If this is the case, then we may need to adjust level of care according to patient needs. Support, both to the patient and caregiver, should then be put into place.

## 2022-04-25 NOTE — Progress Notes (Unsigned)
NEUROLOGY FOLLOW UP OFFICE NOTE  Ronald Wallace [id number] [birth year]  HISTORY OF PRESENT ILLNESS: I had the pleasure of seeing Ronald Wallace in follow-up in the neurology clinic on 04/25/2022.  The patient was last seen 7 months ago for dementia. MMSE 22/30 in 03/2021. He is again accompanied by his wife and daughter who help supplement the history today.  Records and images were personally reviewed where available.  ***.  About the same, still does my chores Dtr wants [place] to move to apt, he does his bath but can't move his left arm so she helps him, legs hurting real bad Wife manages meals, medications Sleeps like a baby No change in HAs; still having dizziness when he first gets up No falls since Dec/early Jan Good mood until they talk about moving; doing better per wife May get a sitter with him; someone comes every other month to clean Wife has helath issues, some of her brain powers are less; talking a toll on her and she won't admit it He is not driving March 24, 2022, Tues   I had the pleasure of seeing Ronald Wallace in follow-up in the neurology clinic on 09/23/2021.  The patient was last seen 6 months ago by [facility] PA Marlowe Kays for dementia. MMSE 22/30 in 03/2021. Memantine was added however he had dizziness and stopped medication. He continues on Donepezil 10mg  daily. He is on Lexapro 20mg  daily for mood. He reports that despite stopping Memantine, the dizziness continues. There is a spinning sensation more when changing positions or getting out of bed/sitting up. It lasts 10-15 minutes until he gets situated. He feels things are moving with his vision. No associated nausea/vomiting, headache, focal numbness/tingling/weakness. He denies any falls but his family reports he has fallen/rolled out of bed a couple of times when dreaming. He acts out his dreams sometimes. No visual hallucinations. His wife feels his memory has really gotten bad, he cannot remember people's names  from 5 minutes prior. He got very disoriented when they travelled for a wedding. She reports worsening irritability mostly with her, if she says something he says she is yelling at him. He states he has "quit worrying about things, I let it go." He states he blows up sometimes when he hears it "all the time." No paranoia or hallucinations. His wife fixes his pillbox and he takes medications by himself. She checks behind him and notes he does well. He drives once a month for an errand but has "just about given up" driving. He got a phone call scam the other day which upset him. He is always tired lately.   History on Initial Assessment 06/18/2019: This is a 82 year old right-handed man with a history of hypertension, hyperlipidemia, diabetes, CAD, CKD, bradycardia, anxiety, depression, presenting for evaluation of memory loss. His daughter,as well as wife on speakerphone, are present to provide additional information. Records were reviewed. He was seen 6 days ago at [facility] by neurologist Dr. Sherryll Burger, however they would still like to proceed with visit today for the same concern. SLUMS score was 18/30.  Bloodwork was ordered, he is scheduled for an MRI brain with NeuroQuant sequence. His family feels that memory changes started only a month ago. He became tearful stating that "I cannot remember things," making him frustrated. He states "some things I can remember, but others I do not." He would forget a conversation from 5 minutes prior. He used to remember announcer names well. He lives with his  wife who has to double check behind him on his insulin, telling him what dosage to take. He writes down bills and his wife pays them. He got lost driving twice in the past month. He got lost driving to their granddaughter's graduation, he apparently got very upset/agitated. He does not remember this. He does recall getting lost driving to get gas. He states he "just about quit driving." Family now has a GPS tracker,  his wife is always in the car with him now. He is independent with dressing and bathing.  His daughter reports that he used to exercise more, then with the pandemic he got depressed. Family noticed that he did not want to be around his grandchildren anymore, getting easily agitated. Some people say something and it "sends him off to another pedestal." He can be so sweet sometimes then curl up and not want to talk to anybody. He has always had issues with his siblings, "always been a part of him," this seems to be getting better. They do feel that since memory changes started, he is doing better with other people. Paxil was recently changed to Lexapro, which he has been taking for only 2 days. No paranoia or hallucinations. He was started on Donepezil 5mg  daily 3 weeks ago which he is overall tolerating. No family history of dementia. He does not drink alcohol. He denies any significant head injuries. He had a syncopal episode in 2018 where he woke up on the ground at work. I personally reviewed MRI brain with and without contrast done 11/2016 which did not show any acute changes. There was mild diffuse atrophy and mild to moderate chronic microvascular disease.   He used to have frequent headaches when he was working and they quieted down, but recently come back "like they used to be." There is no associated nausea/vomiting, photo/phonophobia. Family thinks it is more stress-related. He denies any dizziness, diplopia, dysarthria/dysphagia, bowel/bladder dysfunction, anosmia. He has chronic neck and back pain. He has some tingling and numbness in his hands and feet. He has occasional hand tremors. Sleep is pretty good. They deny any REM behavior disorder, but he has fallen out of bed while sleeping. This was initially happening every 2-3 weeks, but has quieted down, last occurred 3 weeks ago. Family reports he used to exercise more but stopped when the pandemic hit.  Laboratory Data: MRI brain without contrast  done 06/2019 did not show any acute changes. There was moderate chronic microvascular disease, diffuse volume loss with whole brain volume at 2nd percentile.  Neuropsychological testing done in 07/2019 noted that testing must be interpreted cautiously as he was tearful and visibly upset throughout much of the evaluation. He did not show clear memory storage problems and instead had notable encoding issues and relatively preserved retention of information across time on several tasks. He screened positive for depression. Diagnosis was Mild Cognitive Impairment, etiology unclear, possibly mixed AD and vascular, with interference from depression and anxiety.   Repeat Neuropsychological evaluation in 07/2020 indicated transition to Major Neurocognitive Disorder (dementia) compared to prior evaluation. Although there was a good degree of stability compared to prior testing, there was diffuse cognitive impairment with most prominent deficits across processing speed, attention/concentration, executive functioning, receptive language, and semantic fluency. It was noted his affect was much improved compared to prior, which may account for mild improvements in some domains. 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  PAST MEDICAL HISTORY: Past Medical History:  Diagnosis Date   Anemia of chronic disease  07/01/2015   Arthritis    Benign prostatic hyperplasia with nocturia 10/30/2016   Bradycardia 03/12/2014   Bruit of left carotid artery 03/12/2014   CKD (chronic kidney disease) 09/27/2015   stage 3, GFR 30-59 ml/min   Coronary artery disease involving native coronary artery of native heart without angina pectoris    a. 02/2014 Neg Ex MV, EF 65%;  b. 06/2015 Abnl MV ([facility])-->Cath ([facility]): LM nl, LAD 30p/m, LCX 30p, OM2 50p, RCA 100p w/ L-->R collats, nl EF-->Med Rx; c. 10/2017 MV: No isch/scar; d. 11/2017 Cath: LM nl, LAD 45p/m, LCX 30ost, OM2 30, RCA 100 CTO, L->R collats. PA 31/16/22, PCWP 11; e.  05/2020 MV: EF 67%, no isch/scar->low risk.   Diabetes mellitus, type II, insulin dependent    Diabetic neuropathy    Diabetic retinopathy associated with type 2 diabetes mellitus 10/28/2018   Disorder of bone and cartilage    Diverticulitis    DOE (dyspnea on exertion) 03/12/2014   Generalized anxiety disorder    GERD (gastroesophageal reflux disease)    Hypertension associated with diabetes    Major depressive disorder    Major neurocognitive disorder due to multiple etiologies 08/04/2020   likely Alzheimer's disease w/vascular contributions   Mixed hyperlipidemia due to type 2 diabetes mellitus 02/06/2017   Obesity    Osteoporosis    Pure hypercholesterolemia    Syncope and collapse    a. 02/2014 Carotid Dopplers: 1-39% bilateral ICA with normal Subclavian & vertebral arteries; b. 02/2014 2 D Echo: EF 50-55%, Gr 1 DD, Mild MR & LA dilation -- was bradycardic in 40s; c. 04/2014 Event monitor: RSR/SA/SB. Occas PVC's. No AFib.    MEDICATIONS: Current Outpatient Medications on File Prior to Visit  Medication Sig Dispense Refill   acetaminophen (TYLENOL) 325 MG tablet Take 650 mg by mouth every 6 (six) hours as needed.     donepezil (ARICEPT) 10 MG tablet Take 1 tablet (10 mg total) by mouth daily. 90 tablet 3   escitalopram (LEXAPRO) 20 MG tablet Take 1 and 1/2 tablets daily 135 tablet 3   gabapentin (NEURONTIN) 300 MG capsule Take 300-600 mg by mouth See admin instructions. Take 1 capsule (300 mg) by mouth in the morning & 2 capsules (600 mg) by mouth every night.     aspirin EC 81 MG tablet Take 81 mg by mouth daily.     calcium carbonate (OS-CAL) 600 MG TABS tablet Take 600 mg by mouth daily with breakfast.     cholecalciferol (VITAMIN D) 1000 UNITS tablet Take 2,000 Units by mouth daily.      Continuous Blood Gluc Sensor (FREESTYLE LIBRE 2 SENSOR) MISC USE AS DIRECTED - REPLACE EVERY 14 DAYS     cyanocobalamin (VITAMIN B12) 1000 MCG tablet Take 1,000 mcg by mouth daily.     ferrous  sulfate 325 (65 FE) MG tablet Take 325 mg by mouth daily with breakfast.     hydrALAZINE (APRESOLINE) 25 MG tablet Take 1 tablet by mouth 3 (three) times daily.     isosorbide mononitrate (IMDUR) 30 MG 24 hr tablet Take 1 tablet by mouth once daily 90 tablet 1   levothyroxine (SYNTHROID) 50 MCG tablet Take 50 mcg by mouth daily before breakfast.     metFORMIN (GLUCOPHAGE) 500 MG tablet Take 250 mg by mouth 2 (two) times daily with a meal.     mirabegron ER (MYRBETRIQ) 50 MG TB24 tablet Take 1 tablet (50 mg total) by mouth daily. 90 tablet 3   NOVOLOG 100 UNIT/ML  injection Inject 20-38 Units into the skin 3 (three) times daily with meals. Sliding scale insulin base rates= 20 units in the morning, 26 units with lunch, 26 units with supper.     simvastatin (ZOCOR) 20 MG tablet Take 20 mg by mouth daily.     tamsulosin (FLOMAX) 0.4 MG CAPS capsule Take 0.4 mg by mouth daily. Takes 2 each morning.     traMADol (ULTRAM) 50 MG tablet Take 25-50 mg by mouth 2 (two) times daily as needed.     TRESIBA FLEXTOUCH 100 UNIT/ML FlexTouch Pen SMARTSIG:25 Unit(s) SUB-Q Daily     No current facility-administered medications on file prior to visit.    ALLERGIES: Allergies  Allergen Reactions   Amlodipine     Other reaction(s): Other (See Comments) Peripheral edema    FAMILY HISTORY: Family History  Problem Relation Age of Onset   Heart attack Mother    Breast cancer Mother    Lung cancer Father     SOCIAL HISTORY: Social History   Socioeconomic History   Marital status: Married    Spouse name: Not on file   Number of children: Not on file   Years of education: 14   Highest education level: Associate degree: academic program  Occupational History   Occupation: Retired    Comment: Engineer, maintenance  Tobacco Use   Smoking status: Former    Types: Pipe    Quit date: 1965    Years since quitting: 59.3   Smokeless tobacco: Never  Vaping Use   Vaping Use: Never used  Substance and Sexual  Activity   Alcohol use: No   Drug use: No   Sexual activity: Not on file  Other Topics Concern   Not on file  Social History Narrative   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   Married - at least 1 daughter.   Never Smoked   Usually very active, but no exercise routine.   Right handed    Social Determinants of Health   Financial Resource Strain: Not on file  Food Insecurity: Not on file  Transportation Needs: Not on file  Physical Activity: Not on file  Stress: Not on file  Social Connections: Not on file  Intimate Partner Violence: Not on file     PHYSICAL EXAM: Vitals:   04/25/22 1515  BP: (!) 145/72  Pulse: 62  SpO2: 95%   General: No acute distress Head:  Normocephalic/atraumatic Skin/Extremities: No rash, no edema Neurological Exam: alert and oriented to person, place, and time. No aphasia or dysarthria. Fund of knowledge is appropriate.  Recent and remote memory are intact.  Attention and concentration are normal.   Cranial nerves: Pupils equal, round. Extraocular movements intact with no nystagmus. Visual fields full.  No facial asymmetry.  Motor: Bulk and tone normal, muscle strength 5/5 throughout with no pronator drift.   Finger to nose testing intact.  Gait narrow-based and steady, able to tandem walk adequately.  Romberg negative.   IMPRESSION: This is an 82 yo RH man with a history of hypertension, hyperlipidemia, diabetes, CAD, CKD, bradycardia, anxiety, depression, with Neuropsychological evaluation indicating Major Neurocognitive Disorder (dementia), likely mixed vascular and AD. He reported dizziness with Memantine, however it is unclear if Memantine is the culprit as positional dizziness continues. He agrees to Vestibular therapy referral, and if dizziness improves, will consider restarting Memantine. Continue Donepezil 10mg  daily. His wife reports increased irritability, increase Lexapro to 30mg  daily. Continue close supervision, monitor driving. Follow-up  with [facility] PA  Marlowe Kays in 6 months, they know to call for any changes.     Thank you for allowing me to participate in *** care.  Please do not hesitate to call for any questions or concerns.  The duration of this appointment visit was *** minutes of face-to-face time with the patient.  Greater than 50% of this time was spent in counseling, explanation of diagnosis, planning of further management, and coordination of care.   Patrcia Dolly, M.D.   CC: ***

## 2022-04-27 DIAGNOSIS — G309 Alzheimer's disease, unspecified: Secondary | ICD-10-CM | POA: Diagnosis not present

## 2022-04-27 DIAGNOSIS — F02818 Dementia in other diseases classified elsewhere, unspecified severity, with other behavioral disturbance: Secondary | ICD-10-CM | POA: Diagnosis not present

## 2022-04-27 DIAGNOSIS — F01518 Vascular dementia, unspecified severity, with other behavioral disturbance: Secondary | ICD-10-CM | POA: Diagnosis not present

## 2022-04-27 DIAGNOSIS — M7502 Adhesive capsulitis of left shoulder: Secondary | ICD-10-CM | POA: Diagnosis not present

## 2022-04-28 ENCOUNTER — Other Ambulatory Visit: Payer: Self-pay | Admitting: Cardiovascular Disease

## 2022-05-02 ENCOUNTER — Ambulatory Visit: Payer: HMO | Admitting: Physician Assistant

## 2022-05-04 DIAGNOSIS — E1142 Type 2 diabetes mellitus with diabetic polyneuropathy: Secondary | ICD-10-CM | POA: Diagnosis not present

## 2022-05-04 DIAGNOSIS — M7502 Adhesive capsulitis of left shoulder: Secondary | ICD-10-CM | POA: Diagnosis not present

## 2022-05-04 DIAGNOSIS — Z794 Long term (current) use of insulin: Secondary | ICD-10-CM | POA: Diagnosis not present

## 2022-05-09 DIAGNOSIS — E1159 Type 2 diabetes mellitus with other circulatory complications: Secondary | ICD-10-CM | POA: Diagnosis not present

## 2022-05-09 DIAGNOSIS — E1142 Type 2 diabetes mellitus with diabetic polyneuropathy: Secondary | ICD-10-CM | POA: Diagnosis not present

## 2022-05-09 DIAGNOSIS — I152 Hypertension secondary to endocrine disorders: Secondary | ICD-10-CM | POA: Diagnosis not present

## 2022-05-09 DIAGNOSIS — E039 Hypothyroidism, unspecified: Secondary | ICD-10-CM | POA: Diagnosis not present

## 2022-05-09 DIAGNOSIS — E785 Hyperlipidemia, unspecified: Secondary | ICD-10-CM | POA: Diagnosis not present

## 2022-05-09 DIAGNOSIS — E1169 Type 2 diabetes mellitus with other specified complication: Secondary | ICD-10-CM | POA: Diagnosis not present

## 2022-05-09 DIAGNOSIS — Z794 Long term (current) use of insulin: Secondary | ICD-10-CM | POA: Diagnosis not present

## 2022-05-18 DIAGNOSIS — M7502 Adhesive capsulitis of left shoulder: Secondary | ICD-10-CM | POA: Diagnosis not present

## 2022-05-22 DIAGNOSIS — M25512 Pain in left shoulder: Secondary | ICD-10-CM | POA: Diagnosis not present

## 2022-05-22 DIAGNOSIS — G8929 Other chronic pain: Secondary | ICD-10-CM | POA: Diagnosis not present

## 2022-05-25 DIAGNOSIS — E119 Type 2 diabetes mellitus without complications: Secondary | ICD-10-CM | POA: Diagnosis not present

## 2022-05-25 DIAGNOSIS — H43813 Vitreous degeneration, bilateral: Secondary | ICD-10-CM | POA: Diagnosis not present

## 2022-05-25 DIAGNOSIS — Z961 Presence of intraocular lens: Secondary | ICD-10-CM | POA: Diagnosis not present

## 2022-05-25 DIAGNOSIS — H35372 Puckering of macula, left eye: Secondary | ICD-10-CM | POA: Diagnosis not present

## 2022-05-25 DIAGNOSIS — H2640 Unspecified secondary cataract: Secondary | ICD-10-CM | POA: Diagnosis not present

## 2022-05-26 DIAGNOSIS — G8929 Other chronic pain: Secondary | ICD-10-CM | POA: Diagnosis not present

## 2022-05-26 DIAGNOSIS — M25512 Pain in left shoulder: Secondary | ICD-10-CM | POA: Diagnosis not present

## 2022-06-01 DIAGNOSIS — E785 Hyperlipidemia, unspecified: Secondary | ICD-10-CM | POA: Diagnosis not present

## 2022-06-01 DIAGNOSIS — E1169 Type 2 diabetes mellitus with other specified complication: Secondary | ICD-10-CM | POA: Diagnosis not present

## 2022-06-01 DIAGNOSIS — Z794 Long term (current) use of insulin: Secondary | ICD-10-CM | POA: Diagnosis not present

## 2022-06-01 DIAGNOSIS — E1142 Type 2 diabetes mellitus with diabetic polyneuropathy: Secondary | ICD-10-CM | POA: Diagnosis not present

## 2022-06-01 DIAGNOSIS — E1159 Type 2 diabetes mellitus with other circulatory complications: Secondary | ICD-10-CM | POA: Diagnosis not present

## 2022-06-01 DIAGNOSIS — I152 Hypertension secondary to endocrine disorders: Secondary | ICD-10-CM | POA: Diagnosis not present

## 2022-06-01 DIAGNOSIS — Z79899 Other long term (current) drug therapy: Secondary | ICD-10-CM | POA: Diagnosis not present

## 2022-06-01 DIAGNOSIS — E039 Hypothyroidism, unspecified: Secondary | ICD-10-CM | POA: Diagnosis not present

## 2022-06-02 DIAGNOSIS — G8929 Other chronic pain: Secondary | ICD-10-CM | POA: Diagnosis not present

## 2022-06-02 DIAGNOSIS — M25512 Pain in left shoulder: Secondary | ICD-10-CM | POA: Diagnosis not present

## 2022-06-06 DIAGNOSIS — E785 Hyperlipidemia, unspecified: Secondary | ICD-10-CM | POA: Diagnosis not present

## 2022-06-06 DIAGNOSIS — E1169 Type 2 diabetes mellitus with other specified complication: Secondary | ICD-10-CM | POA: Diagnosis not present

## 2022-06-06 DIAGNOSIS — D638 Anemia in other chronic diseases classified elsewhere: Secondary | ICD-10-CM | POA: Diagnosis not present

## 2022-06-06 DIAGNOSIS — Z79899 Other long term (current) drug therapy: Secondary | ICD-10-CM | POA: Diagnosis not present

## 2022-06-06 DIAGNOSIS — D696 Thrombocytopenia, unspecified: Secondary | ICD-10-CM | POA: Diagnosis not present

## 2022-06-06 DIAGNOSIS — Z794 Long term (current) use of insulin: Secondary | ICD-10-CM | POA: Diagnosis not present

## 2022-06-06 DIAGNOSIS — E039 Hypothyroidism, unspecified: Secondary | ICD-10-CM | POA: Diagnosis not present

## 2022-06-06 DIAGNOSIS — E1142 Type 2 diabetes mellitus with diabetic polyneuropathy: Secondary | ICD-10-CM | POA: Diagnosis not present

## 2022-06-07 DIAGNOSIS — M25512 Pain in left shoulder: Secondary | ICD-10-CM | POA: Diagnosis not present

## 2022-06-07 DIAGNOSIS — G8929 Other chronic pain: Secondary | ICD-10-CM | POA: Diagnosis not present

## 2022-06-09 DIAGNOSIS — M25512 Pain in left shoulder: Secondary | ICD-10-CM | POA: Diagnosis not present

## 2022-06-09 DIAGNOSIS — G8929 Other chronic pain: Secondary | ICD-10-CM | POA: Diagnosis not present

## 2022-06-15 DIAGNOSIS — Z794 Long term (current) use of insulin: Secondary | ICD-10-CM | POA: Diagnosis not present

## 2022-06-15 DIAGNOSIS — E1142 Type 2 diabetes mellitus with diabetic polyneuropathy: Secondary | ICD-10-CM | POA: Diagnosis not present

## 2022-06-15 DIAGNOSIS — M7502 Adhesive capsulitis of left shoulder: Secondary | ICD-10-CM | POA: Diagnosis not present

## 2022-06-15 DIAGNOSIS — G8929 Other chronic pain: Secondary | ICD-10-CM | POA: Diagnosis not present

## 2022-06-15 DIAGNOSIS — M25512 Pain in left shoulder: Secondary | ICD-10-CM | POA: Diagnosis not present

## 2022-06-19 DIAGNOSIS — D638 Anemia in other chronic diseases classified elsewhere: Secondary | ICD-10-CM | POA: Diagnosis not present

## 2022-06-19 DIAGNOSIS — E669 Obesity, unspecified: Secondary | ICD-10-CM | POA: Diagnosis not present

## 2022-06-19 DIAGNOSIS — F419 Anxiety disorder, unspecified: Secondary | ICD-10-CM | POA: Diagnosis not present

## 2022-06-19 DIAGNOSIS — N1831 Chronic kidney disease, stage 3a: Secondary | ICD-10-CM | POA: Diagnosis not present

## 2022-06-19 DIAGNOSIS — E785 Hyperlipidemia, unspecified: Secondary | ICD-10-CM | POA: Diagnosis not present

## 2022-06-19 DIAGNOSIS — R4189 Other symptoms and signs involving cognitive functions and awareness: Secondary | ICD-10-CM | POA: Diagnosis not present

## 2022-06-19 DIAGNOSIS — F32A Depression, unspecified: Secondary | ICD-10-CM | POA: Diagnosis not present

## 2022-06-19 DIAGNOSIS — I2510 Atherosclerotic heart disease of native coronary artery without angina pectoris: Secondary | ICD-10-CM | POA: Diagnosis not present

## 2022-06-19 DIAGNOSIS — E1169 Type 2 diabetes mellitus with other specified complication: Secondary | ICD-10-CM | POA: Diagnosis not present

## 2022-06-19 DIAGNOSIS — Z0000 Encounter for general adult medical examination without abnormal findings: Secondary | ICD-10-CM | POA: Diagnosis not present

## 2022-06-21 DIAGNOSIS — M25512 Pain in left shoulder: Secondary | ICD-10-CM | POA: Diagnosis not present

## 2022-06-21 DIAGNOSIS — G8929 Other chronic pain: Secondary | ICD-10-CM | POA: Diagnosis not present

## 2022-06-24 ENCOUNTER — Other Ambulatory Visit
Admission: RE | Admit: 2022-06-24 | Discharge: 2022-06-24 | Disposition: A | Discharge status: Home / Self Care | Payer: PPO | Attending: Orthopedic Surgery | Admitting: Orthopedic Surgery

## 2022-06-24 DIAGNOSIS — R051 Acute cough: Secondary | ICD-10-CM | POA: Diagnosis not present

## 2022-06-24 DIAGNOSIS — R300 Dysuria: Secondary | ICD-10-CM | POA: Insufficient documentation

## 2022-06-24 DIAGNOSIS — N3941 Urge incontinence: Secondary | ICD-10-CM | POA: Insufficient documentation

## 2022-06-24 DIAGNOSIS — R509 Fever, unspecified: Secondary | ICD-10-CM | POA: Diagnosis not present

## 2022-06-24 DIAGNOSIS — R918 Other nonspecific abnormal finding of lung field: Secondary | ICD-10-CM | POA: Diagnosis not present

## 2022-06-24 DIAGNOSIS — Z03818 Encounter for observation for suspected exposure to other biological agents ruled out: Secondary | ICD-10-CM | POA: Diagnosis not present

## 2022-06-24 DIAGNOSIS — R6889 Other general symptoms and signs: Secondary | ICD-10-CM | POA: Diagnosis not present

## 2022-06-24 LAB — BASIC METABOLIC PANEL
Anion gap: 7 (ref 5–15)
BUN: 26 mg/dL — ABNORMAL HIGH (ref 8–23)
CO2: 28 mmol/L (ref 22–32)
Calcium: 9.7 mg/dL (ref 8.9–10.3)
Chloride: 97 mmol/L — ABNORMAL LOW (ref 98–111)
Creatinine, Ser: 1.47 mg/dL — ABNORMAL HIGH (ref 0.61–1.24)
GFR, Estimated: 48 mL/min — ABNORMAL LOW (ref >60)
Glucose, Bld: 179 mg/dL — ABNORMAL HIGH (ref 70–99)
Potassium: 4.5 mmol/L (ref 3.5–5.1)
Sodium: 132 mmol/L — ABNORMAL LOW (ref 135–145)

## 2022-06-26 DIAGNOSIS — I10 Essential (primary) hypertension: Secondary | ICD-10-CM | POA: Diagnosis not present

## 2022-06-26 DIAGNOSIS — Z7982 Long term (current) use of aspirin: Secondary | ICD-10-CM | POA: Diagnosis not present

## 2022-06-26 DIAGNOSIS — N183 Chronic kidney disease, stage 3 unspecified: Secondary | ICD-10-CM | POA: Diagnosis not present

## 2022-06-26 DIAGNOSIS — Z515 Encounter for palliative care: Secondary | ICD-10-CM | POA: Diagnosis not present

## 2022-06-26 DIAGNOSIS — D692 Other nonthrombocytopenic purpura: Secondary | ICD-10-CM | POA: Diagnosis not present

## 2022-06-26 DIAGNOSIS — E1165 Type 2 diabetes mellitus with hyperglycemia: Secondary | ICD-10-CM | POA: Diagnosis not present

## 2022-06-26 DIAGNOSIS — E1122 Type 2 diabetes mellitus with diabetic chronic kidney disease: Secondary | ICD-10-CM | POA: Diagnosis not present

## 2022-06-26 DIAGNOSIS — E1151 Type 2 diabetes mellitus with diabetic peripheral angiopathy without gangrene: Secondary | ICD-10-CM | POA: Diagnosis not present

## 2022-06-26 DIAGNOSIS — E1159 Type 2 diabetes mellitus with other circulatory complications: Secondary | ICD-10-CM | POA: Diagnosis not present

## 2022-06-26 DIAGNOSIS — F330 Major depressive disorder, recurrent, mild: Secondary | ICD-10-CM | POA: Diagnosis not present

## 2022-06-26 DIAGNOSIS — F0393 Unspecified dementia, unspecified severity, with mood disturbance: Secondary | ICD-10-CM | POA: Diagnosis not present

## 2022-06-28 DIAGNOSIS — R3989 Other symptoms and signs involving the genitourinary system: Secondary | ICD-10-CM | POA: Diagnosis not present

## 2022-06-28 DIAGNOSIS — R4189 Other symptoms and signs involving cognitive functions and awareness: Secondary | ICD-10-CM | POA: Diagnosis not present

## 2022-06-28 DIAGNOSIS — E1142 Type 2 diabetes mellitus with diabetic polyneuropathy: Secondary | ICD-10-CM | POA: Diagnosis not present

## 2022-06-28 DIAGNOSIS — Z794 Long term (current) use of insulin: Secondary | ICD-10-CM | POA: Diagnosis not present

## 2022-06-29 DIAGNOSIS — E10311 Type 1 diabetes mellitus with unspecified diabetic retinopathy with macular edema: Secondary | ICD-10-CM | POA: Diagnosis not present

## 2022-06-29 DIAGNOSIS — Z794 Long term (current) use of insulin: Secondary | ICD-10-CM | POA: Diagnosis not present

## 2022-07-12 ENCOUNTER — Encounter: Payer: Self-pay | Admitting: Student in an Organized Health Care Education/Training Program

## 2022-07-12 ENCOUNTER — Ambulatory Visit (INDEPENDENT_AMBULATORY_CARE_PROVIDER_SITE_OTHER): Payer: PPO | Admitting: Student in an Organized Health Care Education/Training Program

## 2022-07-12 VITALS — BP 132/60 | HR 62 | Temp 97.6°F | Ht 64.0 in | Wt 199.4 lb

## 2022-07-12 DIAGNOSIS — G4733 Obstructive sleep apnea (adult) (pediatric): Secondary | ICD-10-CM | POA: Diagnosis not present

## 2022-07-12 DIAGNOSIS — R0602 Shortness of breath: Secondary | ICD-10-CM | POA: Diagnosis not present

## 2022-07-12 NOTE — Progress Notes (Signed)
Assessment & Plan:   1. Shortness of breath 2. OSA (obstructive sleep apnea)  Ronald Wallace is presenting for the evaluation of cough and gurgling sound that per patient's report only happens while Ronald Wallace goes to sleep.  On exam, his lungs are clear and I am unable to elicit said symptoms when Ronald Wallace's asked to lay flat or with maneuvers.  While patient is concerned for asthma, the presentation and constellation of symptoms is not consistent with reactive airway disease.  I would be more concerned about a possible ball-valve effect in his airway for which I will obtain a CT scan of the chest for workup.  I will also obtain a pulmonary function test to assess spirometry for any obstruction.  Finally, some of the symptoms described by the patient and wife as well as him falling asleep quickly in clinic when laying flat does raise concern for obstructive sleep apnea for which I will obtain a home sleep study.  - Home sleep test; Future - Pulmonary Function Test [facility] Only; Future - CT CHEST WO CONTRAST; Future   Return in about 3 months (around 10/12/2022).  I spent 45 minutes caring for this patient today, including preparing to see the patient, obtaining a medical history , reviewing a separately obtained history, performing a medically appropriate examination and/or evaluation, counseling and educating the patient/family/caregiver, ordering medications, tests, or procedures, documenting clinical information in the electronic health record, and independently interpreting results (not separately reported/billed) and communicating results to the patient/family/caregiver  Raechel Chute, MD [facility] 07/12/2022 4:50 PM    End of visit medications:  No orders of the defined types were placed in this encounter.    Current Outpatient Medications:    acetaminophen (TYLENOL) 325 MG tablet, Take 650 mg by mouth every 6 (six) hours as needed., Disp: , Rfl:    aspirin EC 81 MG tablet, Take 81  mg by mouth daily., Disp: , Rfl:    calcium carbonate (OS-CAL) 600 MG TABS tablet, Take 600 mg by mouth daily with breakfast., Disp: , Rfl:    cholecalciferol (VITAMIN D) 1000 UNITS tablet, Take 2,000 Units by mouth daily. , Disp: , Rfl:    Continuous Blood Gluc Sensor (FREESTYLE LIBRE 2 SENSOR) MISC, USE AS DIRECTED - REPLACE EVERY 14 DAYS, Disp: , Rfl:    cyanocobalamin (VITAMIN B12) 1000 MCG tablet, Take 1,000 mcg by mouth daily., Disp: , Rfl:    donepezil (ARICEPT) 10 MG tablet, Take 1 tablet (10 mg total) by mouth daily., Disp: 90 tablet, Rfl: 3   escitalopram (LEXAPRO) 20 MG tablet, Take 1 and 1/2 tablets daily, Disp: 135 tablet, Rfl: 3   ferrous sulfate 325 (65 FE) MG tablet, Take 325 mg by mouth daily with breakfast., Disp: , Rfl:    gabapentin (NEURONTIN) 300 MG capsule, Take 300-600 mg by mouth See admin instructions. Take 1 capsule (300 mg) by mouth in the morning & 2 capsules (600 mg) by mouth every night., Disp: , Rfl:    hydrALAZINE (APRESOLINE) 25 MG tablet, Take 1 tablet by mouth 3 (three) times daily., Disp: , Rfl:    insulin detemir (LEVEMIR) 100 UNIT/ML injection, Inject 50 Units into the skin daily., Disp: , Rfl:    isosorbide mononitrate (IMDUR) 30 MG 24 hr tablet, Take 1 tablet by mouth once daily, Disp: 90 tablet, Rfl: 3   levothyroxine (SYNTHROID) 50 MCG tablet, Take 50 mcg by mouth daily before breakfast., Disp: , Rfl:    metFORMIN (GLUCOPHAGE) 500 MG tablet, Take  250 mg by mouth 2 (two) times daily with a meal., Disp: , Rfl:    mirabegron ER (MYRBETRIQ) 50 MG TB24 tablet, Take 1 tablet (50 mg total) by mouth daily., Disp: 90 tablet, Rfl: 3   NOVOLOG 100 UNIT/ML injection, Inject 20-38 Units into the skin 3 (three) times daily with meals. Sliding scale insulin base rates= 20 units in the morning, 26 units with lunch, 26 units with supper., Disp: , Rfl:    simvastatin (ZOCOR) 20 MG tablet, Take 20 mg by mouth daily., Disp: , Rfl:    tamsulosin (FLOMAX) 0.4 MG CAPS capsule,  Take 0.4 mg by mouth daily. Takes 2 each morning., Disp: , Rfl:    traMADol (ULTRAM) 50 MG tablet, Take 25-50 mg by mouth 2 (two) times daily as needed., Disp: , Rfl:    TRESIBA FLEXTOUCH 100 UNIT/ML FlexTouch Pen, SMARTSIG:25 Unit(s) SUB-Q Daily, Disp: , Rfl:    Subjective:   PATIENT ID: Ronald Wallace GENDER: male DOB: [birth year], MRN: [id number]  Chief Complaint  Patient presents with   pulmonary consult    SOB, dry cough at night and wheezing.     HPI  Patient is a pleasant 82 year old male presenting to clinic for the evaluation of shortness of breath and cough.  Patient reports that his symptoms are most pronounced whenever Ronald Wallace is laying in bed to go to sleep.  Ronald Wallace reports a sensation of a cough that is only present when Ronald Wallace goes to sleep.  Symptoms started exactly 2 weeks ago and have been present since.  Patient denies any previous history of any similar symptoms or of any pulmonary disease.  His wife also thinks this could be a gargly or wheezy sound.  Ronald Wallace does not have a cough during the day and does not endorse any wheezing while up.  Ronald Wallace does not have any chest pain, denies any chest tightness, denies any sputum production, denies any night sweats, and does not have any weight loss.  Ronald Wallace does not have any exertional dyspnea to speak off.  Patient and wife are unable to tell me more about whether Ronald Wallace stops breathing at night or about his snoring.  Patient previously worked as a Human resources officer for [employer].  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  Ronald Wallace has a very distant history of smoking a pipe and quit in 1965.  No other occupational exposures are reported.  Ancillary information including prior medications, full medical/surgical/family/social histories, and PFTs (when available) are listed below and have been reviewed.   Review of Systems  Constitutional:  Negative for chills, fever, malaise/fatigue and weight loss.  Respiratory:  Positive for cough.  Negative for hemoptysis, sputum production, shortness of breath and wheezing.   Cardiovascular:  Negative for chest pain and palpitations.     Objective:   Vitals:   07/12/22 1538  BP: 132/60  Pulse: 62  Temp: 97.6 F (36.4 C)  TempSrc: Temporal  SpO2: 93%  Weight: 199 lb 6.4 oz (90.4 kg)  Height: 5\' 4"  (1.626 m)   93% on RA BMI Readings from Last 3 Encounters:  07/12/22 34.23 kg/m  04/25/22 35.12 kg/m  03/21/22 34.33 kg/m   Wt Readings from Last 3 Encounters:  07/12/22 199 lb 6.4 oz (90.4 kg)  04/25/22 204 lb 9.6 oz (92.8 kg)  03/21/22 200 lb (90.7 kg)    Physical Exam Constitutional:      Appearance: Normal appearance. Ronald Wallace is obese.  Cardiovascular:     Rate and  Rhythm: Normal rate and regular rhythm.     Pulses: Normal pulses.     Heart sounds: Normal heart sounds.  Pulmonary:     Effort: Pulmonary effort is normal. No respiratory distress.     Breath sounds: Normal breath sounds. No wheezing or rales.     Comments: Attempted to have the patient lay flat on the examination table, and his lung exam was clear without any wheezing and we were unable to reproduce the symptoms.  Patient did start to fall asleep, however. Abdominal:     General: There is distension.     Palpations: Abdomen is soft.     Hernia: A hernia (Abdominal hernia) is present.  Neurological:     General: No focal deficit present.     Mental Status: Ronald Wallace is alert.       Ancillary Information    Past Medical History:  Diagnosis Date   Anemia of chronic disease 07/01/2015   Arthritis    Benign prostatic hyperplasia with nocturia 10/30/2016   Bradycardia 03/12/2014   Bruit of left carotid artery 03/12/2014   CKD (chronic kidney disease) 09/27/2015   stage 3, GFR 30-59 ml/min   Coronary artery disease involving native coronary artery of native heart without angina pectoris    a. 02/2014 Neg Ex MV, EF 65%;  b. 06/2015 Abnl MV ([facility])-->Cath ([facility]): LM nl, LAD 30p/m, LCX 30p, OM2  50p, RCA 100p w/ L-->R collats, nl EF-->Med Rx; c. 10/2017 MV: No isch/scar; d. 11/2017 Cath: LM nl, LAD 45p/m, LCX 30ost, OM2 30, RCA 100 CTO, L->R collats. PA 31/16/22, PCWP 11; e. 05/2020 MV: EF 67%, no isch/scar->low risk.   Diabetes mellitus, type II, insulin dependent    Diabetic neuropathy    Diabetic retinopathy associated with type 2 diabetes mellitus 10/28/2018   Disorder of bone and cartilage    Diverticulitis    DOE (dyspnea on exertion) 03/12/2014   Generalized anxiety disorder    GERD (gastroesophageal reflux disease)    Hypertension associated with diabetes    Major depressive disorder    Major neurocognitive disorder due to multiple etiologies 08/04/2020   likely Alzheimer's disease w/vascular contributions   Mixed hyperlipidemia due to type 2 diabetes mellitus 02/06/2017   Obesity    Osteoporosis    Pure hypercholesterolemia    Syncope and collapse    a. 02/2014 Carotid Dopplers: 1-39% bilateral ICA with normal Subclavian & vertebral arteries; b. 02/2014 2 D Echo: EF 50-55%, Gr 1 DD, Mild MR & LA dilation -- was bradycardic in 40s; c. 04/2014 Event monitor: RSR/SA/SB. Occas PVC's. No AFib.     Family History  Problem Relation Age of Onset   Heart attack Mother    Breast cancer Mother    Lung cancer Father      Past Surgical History:  Procedure Laterality Date   CARDIAC CATHETERIZATION Left 07/07/2015   Procedure: Left Heart Cath and Coronary Angiography;  Surgeon: Marcina Millard, MD;  Location: [facility] IN[facility]IVE [facility];  Service: Cardiovascular;  Laterality: Left;   CARPAL TUNNEL RELEASE     CATARACT EXTRACTION Right    CATARACT EXTRACTION W/PHACO Left 10/06/2015   Procedure: CATARACT EXTRACTION PHACO AND INTRAOCULAR LENS PLACEMENT (IOC);  Surgeon: Sallee Lange, MD;  Location: [facility];  Service: Ophthalmology;  Laterality: Left;  [place] 01:42 AP% 25.1 CDE 45.92 Fluid pack lot # [id number]   COLONOSCOPY     COLONOSCOPY WITH PROPOFOL N/A 12/04/2018   Procedure:  COLONOSCOPY WITH PROPOFOL;  Surgeon: Norma Fredrickson, Boykin Nearing, MD;  Location: [facility];  Service: Gastroenterology;  Laterality: N/A;   NM MYOVIEW LTD  03/13/2014   LOW RISK - positive EKG but negative imaging: 2 mm ST Depression in V4 & V5 but No scintigraphic evidence of Ischemia or Infarct o   RIGHT/LEFT HEART CATH AND CORONARY ANGIOGRAPHY N/A 11/21/2017   Procedure: RIGHT/LEFT HEART CATH AND CORONARY ANGIOGRAPHY;  Surgeon: Antonieta Iba, MD;  Location: [facility];  Service: Cardiovascular;  Laterality: N/A;   TRANSTHORACIC ECHOCARDIOGRAM  03/12/2014   EF 50-55%, Gr 1 DD, no RWMA, Mlid MR & LA dil, Normla RV and bradycardic 40s -    Social History   Socioeconomic History   Marital status: Married    Spouse name: Not on file   Number of children: Not on file   Years of education: 14   Highest education level: Associate degree: academic program  Occupational History   Occupation: Retired    Comment: Engineer, maintenance  Tobacco Use   Smoking status: Former    Types: Pipe    Quit date: 1965    Years since quitting: 59.5   Smokeless tobacco: Never  Vaping Use   Vaping Use: Never used  Substance and Sexual Activity   Alcohol use: No   Drug use: No   Sexual activity: Not on file  Other Topics Concern   Not on file  Social History Narrative   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   Married - at least 1 daughter.   Never Smoked   Usually very active, but no exercise routine.   Right handed    Social Determinants of Health   Financial Resource Strain: Not on file  Food Insecurity: Not on file  Transportation Needs: Not on file  Physical Activity: Not on file  Stress: Not on file  Social Connections: Not on file  Intimate Partner Violence: Not on file     Allergies  Allergen Reactions   Amlodipine     Other reaction(s): Other (See Comments) Peripheral edema     CBC    Component Value Date/Time   WBC 7.4 06/18/2019 1154   RBC 3.57 (L) 06/18/2019 1154    HGB 10.9 (L) 06/18/2019 1154   HGB 12.2 (L) 02/15/2014 1328   HCT 32.0 (L) 06/18/2019 1154   HCT 36.7 (L) 02/15/2014 1328   PLT 151.0 06/18/2019 1154   PLT 176 02/15/2014 1328   MCV 89.5 06/18/2019 1154   MCV 87 02/15/2014 1328   MCH 30.3 11/20/2017 0859   MCHC 34.1 06/18/2019 1154   RDW 12.1 06/18/2019 1154   RDW 12.7 02/15/2014 1328    Pulmonary Functions Testing Results:     No data to display          Outpatient Medications Prior to Visit  Medication Sig Dispense Refill   acetaminophen (TYLENOL) 325 MG tablet Take 650 mg by mouth every 6 (six) hours as needed.     aspirin EC 81 MG tablet Take 81 mg by mouth daily.     calcium carbonate (OS-CAL) 600 MG TABS tablet Take 600 mg by mouth daily with breakfast.     cholecalciferol (VITAMIN D) 1000 UNITS tablet Take 2,000 Units by mouth daily.      Continuous Blood Gluc Sensor (FREESTYLE LIBRE 2 SENSOR) MISC USE AS DIRECTED - REPLACE EVERY 14 DAYS     cyanocobalamin (VITAMIN B12) 1000 MCG tablet Take 1,000 mcg by mouth daily.     donepezil (ARICEPT) 10 MG tablet Take 1 tablet (10  mg total) by mouth daily. 90 tablet 3   escitalopram (LEXAPRO) 20 MG tablet Take 1 and 1/2 tablets daily 135 tablet 3   ferrous sulfate 325 (65 FE) MG tablet Take 325 mg by mouth daily with breakfast.     gabapentin (NEURONTIN) 300 MG capsule Take 300-600 mg by mouth See admin instructions. Take 1 capsule (300 mg) by mouth in the morning & 2 capsules (600 mg) by mouth every night.     hydrALAZINE (APRESOLINE) 25 MG tablet Take 1 tablet by mouth 3 (three) times daily.     insulin detemir (LEVEMIR) 100 UNIT/ML injection Inject 50 Units into the skin daily.     isosorbide mononitrate (IMDUR) 30 MG 24 hr tablet Take 1 tablet by mouth once daily 90 tablet 3   levothyroxine (SYNTHROID) 50 MCG tablet Take 50 mcg by mouth daily before breakfast.     metFORMIN (GLUCOPHAGE) 500 MG tablet Take 250 mg by mouth 2 (two) times daily with a meal.     mirabegron ER  (MYRBETRIQ) 50 MG TB24 tablet Take 1 tablet (50 mg total) by mouth daily. 90 tablet 3   NOVOLOG 100 UNIT/ML injection Inject 20-38 Units into the skin 3 (three) times daily with meals. Sliding scale insulin base rates= 20 units in the morning, 26 units with lunch, 26 units with supper.     simvastatin (ZOCOR) 20 MG tablet Take 20 mg by mouth daily.     tamsulosin (FLOMAX) 0.4 MG CAPS capsule Take 0.4 mg by mouth daily. Takes 2 each morning.     traMADol (ULTRAM) 50 MG tablet Take 25-50 mg by mouth 2 (two) times daily as needed.     TRESIBA FLEXTOUCH 100 UNIT/ML FlexTouch Pen SMARTSIG:25 Unit(s) SUB-Q Daily     No facility-administered medications prior to visit.

## 2022-07-24 DIAGNOSIS — M25512 Pain in left shoulder: Secondary | ICD-10-CM | POA: Diagnosis not present

## 2022-07-24 DIAGNOSIS — G8929 Other chronic pain: Secondary | ICD-10-CM | POA: Diagnosis not present

## 2022-07-27 ENCOUNTER — Ambulatory Visit: Payer: PPO

## 2022-07-27 ENCOUNTER — Ambulatory Visit
Admission: RE | Admit: 2022-07-27 | Discharge: 2022-07-27 | Disposition: A | Discharge status: Home / Self Care | Payer: PPO | Source: Ambulatory Visit | Attending: Student in an Organized Health Care Education/Training Program | Admitting: Student in an Organized Health Care Education/Training Program

## 2022-07-27 DIAGNOSIS — R0602 Shortness of breath: Secondary | ICD-10-CM | POA: Diagnosis not present

## 2022-07-27 DIAGNOSIS — G4733 Obstructive sleep apnea (adult) (pediatric): Secondary | ICD-10-CM

## 2022-07-27 DIAGNOSIS — R079 Chest pain, unspecified: Secondary | ICD-10-CM | POA: Diagnosis not present

## 2022-07-27 DIAGNOSIS — R062 Wheezing: Secondary | ICD-10-CM | POA: Diagnosis not present

## 2022-07-28 DIAGNOSIS — G4733 Obstructive sleep apnea (adult) (pediatric): Secondary | ICD-10-CM | POA: Diagnosis not present

## 2022-08-01 ENCOUNTER — Other Ambulatory Visit: Payer: Self-pay | Admitting: Student in an Organized Health Care Education/Training Program

## 2022-08-01 ENCOUNTER — Telehealth: Payer: Self-pay

## 2022-08-01 DIAGNOSIS — G8929 Other chronic pain: Secondary | ICD-10-CM | POA: Diagnosis not present

## 2022-08-01 DIAGNOSIS — M25512 Pain in left shoulder: Secondary | ICD-10-CM | POA: Diagnosis not present

## 2022-08-01 DIAGNOSIS — M7502 Adhesive capsulitis of left shoulder: Secondary | ICD-10-CM | POA: Diagnosis not present

## 2022-08-01 DIAGNOSIS — G4733 Obstructive sleep apnea (adult) (pediatric): Secondary | ICD-10-CM

## 2022-08-01 NOTE — Telephone Encounter (Signed)
Patient's spouse, Rebecca(DPR) is aware that cpap has been ordered. She voiced her understanding.  Nothing further needed.

## 2022-08-15 ENCOUNTER — Telehealth: Payer: Self-pay | Admitting: Student in an Organized Health Care Education/Training Program

## 2022-08-15 ENCOUNTER — Ambulatory Visit: Payer: PPO | Admitting: Physician Assistant

## 2022-08-15 VITALS — BP 126/57 | HR 54 | Ht 64.0 in | Wt 199.0 lb

## 2022-08-15 DIAGNOSIS — D2262 Melanocytic nevi of left upper limb, including shoulder: Secondary | ICD-10-CM | POA: Diagnosis not present

## 2022-08-15 DIAGNOSIS — D044 Carcinoma in situ of skin of scalp and neck: Secondary | ICD-10-CM | POA: Diagnosis not present

## 2022-08-15 DIAGNOSIS — L570 Actinic keratosis: Secondary | ICD-10-CM | POA: Diagnosis not present

## 2022-08-15 DIAGNOSIS — D2261 Melanocytic nevi of right upper limb, including shoulder: Secondary | ICD-10-CM | POA: Diagnosis not present

## 2022-08-15 DIAGNOSIS — D225 Melanocytic nevi of trunk: Secondary | ICD-10-CM | POA: Diagnosis not present

## 2022-08-15 DIAGNOSIS — N3943 Post-void dribbling: Secondary | ICD-10-CM

## 2022-08-15 DIAGNOSIS — D2272 Melanocytic nevi of left lower limb, including hip: Secondary | ICD-10-CM | POA: Diagnosis not present

## 2022-08-15 DIAGNOSIS — D485 Neoplasm of uncertain behavior of skin: Secondary | ICD-10-CM | POA: Diagnosis not present

## 2022-08-15 DIAGNOSIS — Z85828 Personal history of other malignant neoplasm of skin: Secondary | ICD-10-CM | POA: Diagnosis not present

## 2022-08-15 DIAGNOSIS — Z85820 Personal history of malignant melanoma of skin: Secondary | ICD-10-CM | POA: Diagnosis not present

## 2022-08-15 LAB — URINALYSIS, COMPLETE
Bilirubin, UA: NEGATIVE
Ketones, UA: NEGATIVE
Leukocytes,UA: NEGATIVE
Nitrite, UA: NEGATIVE
RBC, UA: NEGATIVE
Specific Gravity, UA: 1.025 (ref 1.005–1.030)
Urobilinogen, Ur: 0.2 mg/dL (ref 0.2–1.0)
pH, UA: 5.5 (ref 5.0–7.5)

## 2022-08-15 LAB — MICROSCOPIC EXAMINATION

## 2022-08-15 MED ORDER — GEMTESA 75 MG PO TABS
75.0000 mg | ORAL_TABLET | Freq: Every day | ORAL | 0 refills | Status: DC
Start: 2022-08-15 — End: 2022-09-25

## 2022-08-15 NOTE — Telephone Encounter (Signed)
Lurena Joiner wife is returning phone call. Lurena Joiner phone number is [phone].

## 2022-08-15 NOTE — Progress Notes (Signed)
08/15/2022 12:05 PM   Ronald Wallace 1940/03/28 [id number]  CC: Chief Complaint  Patient presents with   Follow-up   HPI: Ronald Wallace is a 82 y.o. male with PMH BPH on Flomax and finasteride with urgency, frequency, and urge incontinence who presents today for follow-up follow-up on Myrbetriq 50 mg.  He is accompanied today by his wife, who contributes to HPI.   Today he reports he remains on Myrbetriq, but they are unsure if it continues to help him.  He is continuing to have urinary urgency and dribbling both before and after voiding.  He denies dysuria and gross hematuria.  PMH: Past Medical History:  Diagnosis Date   Anemia of chronic disease 07/01/2015   Arthritis    Benign prostatic hyperplasia with nocturia 10/30/2016   Bradycardia 03/12/2014   Bruit of left carotid artery 03/12/2014   CKD (chronic kidney disease) 09/27/2015   stage 3, GFR 30-59 ml/min   Coronary artery disease involving native coronary artery of native heart without angina pectoris    a. 02/2014 Neg Ex MV, EF 65%;  b. 06/2015 Abnl MV ([facility])-->Cath ([facility]): LM nl, LAD 30p/m, LCX 30p, OM2 50p, RCA 100p w/ L-->R collats, nl EF-->Med Rx; c. 10/2017 MV: No isch/scar; d. 11/2017 Cath: LM nl, LAD 45p/m, LCX 30ost, OM2 30, RCA 100 CTO, L->R collats. PA 31/16/22, PCWP 11; e. 05/2020 MV: EF 67%, no isch/scar->low risk.   Diabetes mellitus, type II, insulin dependent    Diabetic neuropathy    Diabetic retinopathy associated with type 2 diabetes mellitus 10/28/2018   Disorder of bone and cartilage    Diverticulitis    DOE (dyspnea on exertion) 03/12/2014   Generalized anxiety disorder    GERD (gastroesophageal reflux disease)    Hypertension associated with diabetes    Major depressive disorder    Major neurocognitive disorder due to multiple etiologies 08/04/2020   likely Alzheimer's disease w/vascular contributions   Mixed hyperlipidemia due to type 2 diabetes mellitus 02/06/2017   Obesity     Osteoporosis    Pure hypercholesterolemia    Syncope and collapse    a. 02/2014 Carotid Dopplers: 1-39% bilateral ICA with normal Subclavian & vertebral arteries; b. 02/2014 2 D Echo: EF 50-55%, Gr 1 DD, Mild MR & LA dilation -- was bradycardic in 40s; c. 04/2014 Event monitor: RSR/SA/SB. Occas PVC's. No AFib.    Surgical History: Past Surgical History:  Procedure Laterality Date   CARDIAC CATHETERIZATION Left 07/07/2015   Procedure: Left Heart Cath and Coronary Angiography;  Surgeon: Marcina Millard, MD;  Location: [facility];  Service: Cardiovascular;  Laterality: Left;   CARPAL TUNNEL RELEASE     CATARACT EXTRACTION Right    CATARACT EXTRACTION W/PHACO Left 10/06/2015   Procedure: CATARACT EXTRACTION PHACO AND INTRAOCULAR LENS PLACEMENT (IOC);  Surgeon: Sallee Lange, MD;  Location: [facility];  Service: Ophthalmology;  Laterality: Left;  [place] 01:42 AP% 25.1 CDE 45.92 Fluid pack lot # [id number]   COLONOSCOPY     COLONOSCOPY WITH PROPOFOL N/A 12/04/2018   Procedure: COLONOSCOPY WITH PROPOFOL;  Surgeon: Toledo, Boykin Nearing, MD;  Location: [facility];  Service: Gastroenterology;  Laterality: N/A;   NM MYOVIEW LTD  03/13/2014   LOW RISK - positive EKG but negative imaging: 2 mm ST Depression in V4 & V5 but No scintigraphic evidence of Ischemia or Infarct o   RIGHT/LEFT HEART CATH AND CORONARY ANGIOGRAPHY N/A 11/21/2017   Procedure: RIGHT/LEFT HEART CATH AND CORONARY ANGIOGRAPHY;  Surgeon: Antonieta Iba, MD;  Location: [facility];  Service: Cardiovascular;  Laterality: N/A;   TRANSTHORACIC ECHOCARDIOGRAM  03/12/2014   EF 50-55%, Gr 1 DD, no RWMA, Mlid MR & LA dil, Normla RV and bradycardic 40s -    Home Medications:  Allergies as of 08/15/2022       Reactions   Amlodipine    Other reaction(s): Other (See Comments) Peripheral edema        Medication List        Accurate as of August 15, 2022 12:05 PM. If you have any questions, ask your nurse or doctor.           STOP taking these medications    mirabegron ER 50 MG Tb24 tablet Commonly known as: MYRBETRIQ       TAKE these medications    acetaminophen 325 MG tablet Commonly known as: TYLENOL Take 650 mg by mouth every 6 (six) hours as needed.   aspirin EC 81 MG tablet Take 81 mg by mouth daily.   calcium carbonate 600 MG Tabs tablet Commonly known as: OS-CAL Take 600 mg by mouth daily with breakfast.   cholecalciferol 1000 units tablet Commonly known as: VITAMIN D Take 2,000 Units by mouth daily.   cyanocobalamin 1000 MCG tablet Commonly known as: VITAMIN B12 Take 1,000 mcg by mouth daily.   donepezil 10 MG tablet Commonly known as: ARICEPT Take 1 tablet (10 mg total) by mouth daily.   escitalopram 20 MG tablet Commonly known as: Lexapro Take 1 and 1/2 tablets daily   ferrous sulfate 325 (65 FE) MG tablet Take 325 mg by mouth daily with breakfast.   FreeStyle Libre 2 Sensor Misc USE AS DIRECTED - REPLACE EVERY 14 DAYS   gabapentin 300 MG capsule Commonly known as: NEURONTIN Take 300-600 mg by mouth See admin instructions. Take 1 capsule (300 mg) by mouth in the morning & 2 capsules (600 mg) by mouth every night.   Gemtesa 75 MG Tabs Generic drug: Vibegron Take 1 tablet (75 mg total) by mouth daily.   hydrALAZINE 25 MG tablet Commonly known as: APRESOLINE Take 1 tablet by mouth 3 (three) times daily.   insulin detemir 100 UNIT/ML injection Commonly known as: LEVEMIR Inject 50 Units into the skin daily.   isosorbide mononitrate 30 MG 24 hr tablet Commonly known as: IMDUR Take 1 tablet by mouth once daily   levothyroxine 50 MCG tablet Commonly known as: SYNTHROID Take 50 mcg by mouth daily before breakfast.   metFORMIN 500 MG tablet Commonly known as: GLUCOPHAGE Take 250 mg by mouth 2 (two) times daily with a meal.   NovoLOG 100 UNIT/ML injection Generic drug: insulin aspart Inject 20-38 Units into the skin 3 (three) times daily with meals.  Sliding scale insulin base rates= 20 units in the morning, 26 units with lunch, 26 units with supper.   simvastatin 20 MG tablet Commonly known as: ZOCOR Take 20 mg by mouth daily.   tamsulosin 0.4 MG Caps capsule Commonly known as: FLOMAX Take 0.4 mg by mouth daily. Takes 2 each morning.   traMADol 50 MG tablet Commonly known as: ULTRAM Take 25-50 mg by mouth 2 (two) times daily as needed.   Evaristo Bury FlexTouch 100 UNIT/ML FlexTouch Pen Generic drug: insulin degludec SMARTSIG:25 Unit(s) SUB-Q Daily        Allergies:  Allergies  Allergen Reactions   Amlodipine     Other reaction(s): Other (See Comments) Peripheral edema    Family History: Family History  Problem Relation Age of Onset  Heart attack Mother    Breast cancer Mother    Lung cancer Father     Social History:   reports that he quit smoking about 59 years ago. His smoking use included pipe. He has never used smokeless tobacco. He reports that he does not drink alcohol and does not use drugs.  Physical Exam: BP (!) 126/57   Pulse (!) 54   Ht 5\' 4"  (1.626 m)   Wt 199 lb (90.3 kg)   BMI 34.16 kg/m   Constitutional:  Alert and oriented, no acute distress, nontoxic appearing HEENT: [place], AT Cardiovascular: No clubbing, cyanosis, or edema Respiratory: Normal respiratory effort, no increased work of breathing Skin: No rashes, bruises or suspicious lesions Neurologic: Grossly intact, no focal deficits, moving all 4 extremities Psychiatric: Normal mood and affect  Assessment & Plan:   1. Urinary dribbling He was previously on Gemtesa but stopped it for unclear reasons.  Will switch him back today, samples provided.  May consider augmenting with third line therapies in the future.  Will plan for symptom recheck and PVR in 6 weeks.  I asked him to leave a urine sample on the way out today and we will contact him with his results to rule out infection. - Vibegron (GEMTESA) 75 MG TABS; Take 1 tablet (75 mg total)  by mouth daily.  Dispense: 42 tablet; Refill: 0 - Urinalysis, Complete  Return in about 6 weeks (around 09/26/2022) for Symptom recheck with PVR, will call with UA results.  Carman Ching, PA-C  [facility] [place] [address], [address] [place], [place] [address] [phone]

## 2022-08-15 NOTE — Telephone Encounter (Signed)
Bonney Leitz, CMA 08/15/2022  1:33 PM EDT Back to Top    I have notified the patient's wife.   Nothing further needed.   Rosaland Lao, CMA 08/15/2022 11:34 AM EDT     Lm x1 for patient.   Raechel Chute, MD 08/15/2022 11:03 AM EDT     CT chest is within normal and doesn't show any abnormalities to explain shortness of breath. A reassuring result. Please call the patient with the result, thank you!

## 2022-08-30 DIAGNOSIS — G4733 Obstructive sleep apnea (adult) (pediatric): Secondary | ICD-10-CM | POA: Diagnosis not present

## 2022-08-31 ENCOUNTER — Telehealth: Payer: Self-pay | Admitting: Physician Assistant

## 2022-08-31 DIAGNOSIS — R350 Frequency of micturition: Secondary | ICD-10-CM

## 2022-08-31 DIAGNOSIS — N3943 Post-void dribbling: Secondary | ICD-10-CM

## 2022-08-31 NOTE — Telephone Encounter (Signed)
Patient's wife called and stated that Gemtesa samples that patient was given at last appointment seem to be working, and he would like a prescription. Pharmacy is [facility] on [address]

## 2022-09-04 NOTE — Telephone Encounter (Signed)
He's scheduled to see me back for 6 week symptom recheck and PVR on 9/10, please keep that appointment to ensure he's emptying appropriately and I will prescribe at that visit.

## 2022-09-04 NOTE — Telephone Encounter (Signed)
PTs spouse aware.

## 2022-09-13 DIAGNOSIS — R1310 Dysphagia, unspecified: Secondary | ICD-10-CM | POA: Diagnosis not present

## 2022-09-13 DIAGNOSIS — W57XXXA Bitten or stung by nonvenomous insect and other nonvenomous arthropods, initial encounter: Secondary | ICD-10-CM | POA: Diagnosis not present

## 2022-09-13 DIAGNOSIS — R600 Localized edema: Secondary | ICD-10-CM | POA: Diagnosis not present

## 2022-09-13 DIAGNOSIS — S80862A Insect bite (nonvenomous), left lower leg, initial encounter: Secondary | ICD-10-CM | POA: Diagnosis not present

## 2022-09-13 DIAGNOSIS — N1831 Chronic kidney disease, stage 3a: Secondary | ICD-10-CM | POA: Diagnosis not present

## 2022-09-24 ENCOUNTER — Emergency Department
Admission: EM | Admit: 2022-09-24 | Discharge: 2022-09-24 | Discharge status: Against Medical Advice | Payer: PPO | Attending: Emergency Medicine | Admitting: Emergency Medicine

## 2022-09-24 DIAGNOSIS — R531 Weakness: Secondary | ICD-10-CM | POA: Diagnosis not present

## 2022-09-24 DIAGNOSIS — Y9222 Religious institution as the place of occurrence of the external cause: Secondary | ICD-10-CM | POA: Insufficient documentation

## 2022-09-24 DIAGNOSIS — W19XXXA Unspecified fall, initial encounter: Secondary | ICD-10-CM | POA: Insufficient documentation

## 2022-09-24 DIAGNOSIS — M79605 Pain in left leg: Secondary | ICD-10-CM | POA: Diagnosis not present

## 2022-09-24 DIAGNOSIS — R001 Bradycardia, unspecified: Secondary | ICD-10-CM | POA: Diagnosis not present

## 2022-09-24 LAB — COMPREHENSIVE METABOLIC PANEL
ALT: 23 U/L (ref 0–44)
AST: 22 U/L (ref 15–41)
Albumin: 3.9 g/dL (ref 3.5–5.0)
Alkaline Phosphatase: 67 U/L (ref 38–126)
Anion gap: 8 (ref 5–15)
BUN: 21 mg/dL (ref 8–23)
CO2: 25 mmol/L (ref 22–32)
Calcium: 9.4 mg/dL (ref 8.9–10.3)
Chloride: 103 mmol/L (ref 98–111)
Creatinine, Ser: 1.55 mg/dL — ABNORMAL HIGH (ref 0.61–1.24)
GFR, Estimated: 45 mL/min — ABNORMAL LOW (ref >60)
Glucose, Bld: 237 mg/dL — ABNORMAL HIGH (ref 70–99)
Potassium: 5 mmol/L (ref 3.5–5.1)
Sodium: 136 mmol/L (ref 135–145)
Total Bilirubin: 0.6 mg/dL (ref 0.3–1.2)
Total Protein: 7.2 g/dL (ref 6.5–8.1)

## 2022-09-24 NOTE — ED Notes (Addendum)
RN to bedside to introduce self to pt. Pt is resting comfprtably and family at bedside.Family is disgruntled on how long they have had to wait already. Pt and family were educated that this is an emergency room and there is always a wait unless the patient is critical.

## 2022-09-24 NOTE — ED Notes (Signed)
Family of this pt stormed out of the room and demanded to see a provider wondering, "How much longer is this going to take!?" It was explained that the pt hasn't been in the ER for long, we are monitoring them, and there are two providers that are trying to see all of the pts.  The woman stated that she was going to give [place] another hour and if "Nothing was done by then, then we will leave."

## 2022-09-24 NOTE — ED Notes (Signed)
Pt is very unsteady on his feet at this time. Pt left in care of his daughter and wife.

## 2022-09-24 NOTE — ED Notes (Signed)
Pt family is very rude and disgruntled to this RN about the wait and why it was still taking so long.

## 2022-09-24 NOTE — ED Triage Notes (Signed)
C/O fall today.  Left leg has been hurting for weeks and gets weak sometimes. 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  Arrives with c/o left leg pain, same pain that has been ongoing.  NO new pain today.

## 2022-09-24 NOTE — ED Provider Notes (Signed)
[facility] Provider Note   Event Date/Time   First MD Initiated Contact with Patient 09/24/22 1946     (approximate) History  Fall  HPI Ronald Wallace is a 82 y.o. male who presents for left leg pain and weakness including a fall at church today.  Patient continues to have leg pain with no new pains today.  No head trauma or blood thinner use ROS: Unable to assess   Physical Exam  Triage Vital Signs: ED Triage Vitals  Encounter Vitals Group     BP 09/24/22 1815 (!) 189/64     Systolic BP Percentile --      Diastolic BP Percentile --      Pulse Rate 09/24/22 1815 (!) 48     Resp 09/24/22 1815 16     Temp 09/24/22 1815 (!) 97.3 F (36.3 C)     Temp Source 09/24/22 1815 Oral     SpO2 09/24/22 1815 97 %     Weight 09/24/22 1816 199 lb 1.2 oz (90.3 kg)     Height 09/24/22 1816 5\' 4"  (1.626 m)     Head Circumference --      Peak Flow --      Pain Score 09/24/22 1816 4     Pain Loc --      Pain Education --      Exclude from Growth Chart --    Most recent vital signs: Vitals:   09/24/22 2000 09/24/22 2100  BP: (!) 189/65 (!) 173/64  Pulse: (!) 43 (!) 44  Resp:    Temp:    SpO2: 98% 97%   General: Awake, cooperative CV:  Good peripheral perfusion.  Resp:  Normal effort.  Abd:  No distention.  Other:  Elderly overweight Caucasian male resting comfortably in no acute distress ED Results / Procedures / Treatments  Labs (all labs ordered are listed, but only abnormal results are displayed) Labs Reviewed  COMPREHENSIVE METABOLIC PANEL - Abnormal; Notable for the following components:      Result Value   Glucose, Bld 237 (*)    Creatinine, Ser 1.55 (*)    GFR, Estimated 45 (*)    All other components within normal limits  CBC WITH DIFFERENTIAL/PLATELET  CBC WITH DIFFERENTIAL/PLATELET   EKG ED ECG REPORT I, Merwyn Katos, the attending physician, personally viewed and interpreted this ECG. Date: 09/24/2022 EKG Time: 1826 Rate:  42 Rhythm: normal sinus rhythm QRS Axis: normal Intervals: normal ST/T Wave abnormalities: normal Narrative Interpretation: no evidence of acute ischemia PROCEDURES: Critical Care performed: No Procedures MEDICATIONS ORDERED IN ED: Medications - No data to display IMPRESSION / MDM / ASSESSMENT AND PLAN / ED COURSE  I reviewed the triage vital signs and the nursing notes.                             The patient is on the cardiac monitor to evaluate for evidence of arrhythmia and/or significant heart rate changes. Patient's presentation is most consistent with acute presentation with potential threat to life or bodily function. 82 year old male presents for left leg pain Given history, exam and workup I have low suspicion for fracture, dislocation, significant ligamentous injury, septic arthritis, gout flare, new autoimmune arthropathy, or gonococcal arthropathy.  Interventions: None Disposition: This patient has elected to leave against medical advice. In my opinion, the patient has capacity to leave AMA. The patient is clinically sober, free from distracting injury, appears to  have intact insight and judgment and reason, and in my opinion has capacity to make decisions. I explained to the patient that his symptoms may represent DVT, arterial occlusion, or cellulitis and the patient verbalized understanding of my concerns.  I had a discussion with the patient about their workup and results, and that they may still have symptomatic bradycardia or significant arterial occlusion/vascular occlusion of the leg . I informed the patient that the next step in diagnosis and treatment would be further imaging of this left lower extremity, and they verbalized understanding of this as well. I explained the risks of leaving without further workup or treatment, which included reasonably foreseeable complications such as death, serious injury, permanent disability. I also offered alternatives to departing  AMA such as assigning the patient a different provider or an alternate workup pathway.  The patient is refusing any further care, specifically imaging, and is leaving against medical advice. I am unable to convince the patient to stay. I have asked them to return as soon as possible to complete their evaluation, and also explained that they were welcome to return to the ER for further evaluation whenever they choose. I have asked the patient to follow up with their primary doctor as soon as possible. I have answered all their questions. Patient signed AMA paperwork.   FINAL CLINICAL IMPRESSION(S) / ED DIAGNOSES   Final diagnoses:  Left leg pain   Rx / DC Orders   ED Discharge Orders     None      Note:  This document was prepared using Dragon voice recognition software and may include unintentional dictation errors.   Merwyn Katos, MD 09/24/22 [phone]

## 2022-09-24 NOTE — ED Notes (Signed)
Family of pt has been to this RN's desk and demanding to leave. This RN to bedside to get family to sign an AMA>

## 2022-09-24 NOTE — ED Notes (Signed)
Family of the pt have been to the secretary's desk multiple times stating "we have been here too long. Why is this taking so long. We are leaving".

## 2022-09-24 NOTE — ED Notes (Signed)
Signature pad not working. Pt verbalizes understanding of leaving against medical advice. Pt is still determined to leave.

## 2022-09-25 ENCOUNTER — Ambulatory Visit: Payer: HMO | Admitting: Neurology

## 2022-09-25 DIAGNOSIS — I2510 Atherosclerotic heart disease of native coronary artery without angina pectoris: Secondary | ICD-10-CM | POA: Diagnosis not present

## 2022-09-25 DIAGNOSIS — Z794 Long term (current) use of insulin: Secondary | ICD-10-CM | POA: Diagnosis not present

## 2022-09-25 DIAGNOSIS — N1831 Chronic kidney disease, stage 3a: Secondary | ICD-10-CM | POA: Diagnosis not present

## 2022-09-25 DIAGNOSIS — M5442 Lumbago with sciatica, left side: Secondary | ICD-10-CM | POA: Diagnosis not present

## 2022-09-25 DIAGNOSIS — R42 Dizziness and giddiness: Secondary | ICD-10-CM | POA: Diagnosis not present

## 2022-09-25 DIAGNOSIS — E1142 Type 2 diabetes mellitus with diabetic polyneuropathy: Secondary | ICD-10-CM | POA: Diagnosis not present

## 2022-09-25 DIAGNOSIS — R001 Bradycardia, unspecified: Secondary | ICD-10-CM | POA: Diagnosis not present

## 2022-09-25 DIAGNOSIS — G8929 Other chronic pain: Secondary | ICD-10-CM | POA: Diagnosis not present

## 2022-09-25 MED ORDER — GEMTESA 75 MG PO TABS
75.0000 mg | ORAL_TABLET | Freq: Every day | ORAL | 11 refills | Status: DC
Start: 2022-09-25 — End: 2022-11-14

## 2022-09-25 NOTE — Telephone Encounter (Signed)
Pt called office to r/s appt due to pt falling at church yesterday.  He only has 5 pills left of Gemtesa that you gave him samples of.  He is r/s for next Wednesday 9/18.  Do you want to give him a week worth of samples OR send in RX?

## 2022-09-25 NOTE — Telephone Encounter (Signed)
82-year-old Rx sent to [facility] on [address].

## 2022-09-25 NOTE — Group Note (Deleted)

## 2022-09-25 NOTE — Addendum Note (Signed)
Addended by: Debarah Crape on: 09/25/2022 01:37 PM   Modules accepted: Orders

## 2022-09-26 ENCOUNTER — Ambulatory Visit: Payer: PPO | Admitting: Physician Assistant

## 2022-09-28 ENCOUNTER — Telehealth: Payer: Self-pay | Admitting: Cardiovascular Disease

## 2022-09-28 NOTE — Telephone Encounter (Signed)
Spoke with patients wife per release form. Patient has low heart rate of 42 with some dizziness. Reviewed chart and it appears he is consistently low rates. She did report at church he fell but the ushers did catch him to keep him from falling to the floor. Advised that I would send message over to Dr. Mariah Milling for his review and any recommendations he may have. She verbalized understanding with no further questions.

## 2022-09-28 NOTE — Telephone Encounter (Signed)
STAT if HR is under 50 or over 120 (normal HR is 60-100 beats per minute)  What is your heart rate? On Sunday was 42  Do you have a log of your heart rate readings (document readings)?   Do you have any other symptoms? Patient's wife stated that hte patient was bending down to pick something up on Sunday, and that's when he fell. They checked his hr and its was 42. Please advise.

## 2022-09-29 ENCOUNTER — Ambulatory Visit: Payer: PPO | Attending: Cardiovascular Disease

## 2022-09-29 ENCOUNTER — Telehealth: Payer: Self-pay | Admitting: Emergency Medicine

## 2022-09-29 DIAGNOSIS — R001 Bradycardia, unspecified: Secondary | ICD-10-CM

## 2022-09-29 NOTE — Telephone Encounter (Signed)
Called and spoke with wife. Notified her of the following from Dr. Mariah Milling.  We have had documentation of low heart rates before on event monitor end of 2023  Typically EKGs show rates 50-60  Blood pressure was elevated in the emergency room  Suspect low heart rate likely not be because of his fall, most likely from mechanical fall, leg weakness.  To further examine low heart rate would recommend a 3-day Holter monitor  Currently not on any medications we can stop to make his heart rate go faster  Would recommend they continue to monitor blood pressure at home  Thx  TGollan   Wife verbalizes understanding. Orders placed for Zio monitor.

## 2022-09-29 NOTE — Telephone Encounter (Signed)
Called and spoke with wife. Informed her of the following from Dr. Mariah Milling.  We have had documentation of low heart rates before on event monitor end of 2023  Typically EKGs show rates 50-60  Blood pressure was elevated in the emergency room  Suspect low heart rate likely not be because of his fall, most likely from mechanical fall, leg weakness.  To further examine low heart rate would recommend a 3-day Holter monitor  Currently not on any medications we can stop to make his heart rate go faster  Would recommend they continue to monitor blood pressure at home  Thx  TGollan   Wife verbalizes understanding.

## 2022-09-30 DIAGNOSIS — G4733 Obstructive sleep apnea (adult) (pediatric): Secondary | ICD-10-CM | POA: Diagnosis not present

## 2022-10-04 ENCOUNTER — Ambulatory Visit: Payer: PPO | Admitting: Physician Assistant

## 2022-10-04 DIAGNOSIS — I152 Hypertension secondary to endocrine disorders: Secondary | ICD-10-CM | POA: Diagnosis not present

## 2022-10-04 DIAGNOSIS — E785 Hyperlipidemia, unspecified: Secondary | ICD-10-CM | POA: Diagnosis not present

## 2022-10-04 DIAGNOSIS — E1169 Type 2 diabetes mellitus with other specified complication: Secondary | ICD-10-CM | POA: Diagnosis not present

## 2022-10-04 DIAGNOSIS — Z794 Long term (current) use of insulin: Secondary | ICD-10-CM | POA: Diagnosis not present

## 2022-10-04 DIAGNOSIS — E1142 Type 2 diabetes mellitus with diabetic polyneuropathy: Secondary | ICD-10-CM | POA: Diagnosis not present

## 2022-10-04 DIAGNOSIS — E1159 Type 2 diabetes mellitus with other circulatory complications: Secondary | ICD-10-CM | POA: Diagnosis not present

## 2022-10-04 DIAGNOSIS — E039 Hypothyroidism, unspecified: Secondary | ICD-10-CM | POA: Diagnosis not present

## 2022-10-06 ENCOUNTER — Ambulatory Visit: Payer: PPO | Admitting: Physician Assistant

## 2022-10-06 ENCOUNTER — Encounter: Payer: Self-pay | Admitting: Physician Assistant

## 2022-10-06 VITALS — BP 129/84 | HR 61 | Ht 65.0 in | Wt 202.0 lb

## 2022-10-06 DIAGNOSIS — M48061 Spinal stenosis, lumbar region without neurogenic claudication: Secondary | ICD-10-CM | POA: Diagnosis not present

## 2022-10-06 DIAGNOSIS — N3943 Post-void dribbling: Secondary | ICD-10-CM | POA: Diagnosis not present

## 2022-10-06 DIAGNOSIS — M5441 Lumbago with sciatica, right side: Secondary | ICD-10-CM | POA: Diagnosis not present

## 2022-10-06 DIAGNOSIS — M79604 Pain in right leg: Secondary | ICD-10-CM | POA: Diagnosis not present

## 2022-10-06 DIAGNOSIS — M5136 Other intervertebral disc degeneration, lumbar region: Secondary | ICD-10-CM | POA: Diagnosis not present

## 2022-10-06 DIAGNOSIS — M47816 Spondylosis without myelopathy or radiculopathy, lumbar region: Secondary | ICD-10-CM | POA: Diagnosis not present

## 2022-10-06 DIAGNOSIS — M5116 Intervertebral disc disorders with radiculopathy, lumbar region: Secondary | ICD-10-CM | POA: Diagnosis not present

## 2022-10-06 DIAGNOSIS — M5442 Lumbago with sciatica, left side: Secondary | ICD-10-CM | POA: Diagnosis not present

## 2022-10-06 DIAGNOSIS — M4807 Spinal stenosis, lumbosacral region: Secondary | ICD-10-CM | POA: Diagnosis not present

## 2022-10-06 DIAGNOSIS — G8929 Other chronic pain: Secondary | ICD-10-CM | POA: Diagnosis not present

## 2022-10-06 DIAGNOSIS — M5137 Other intervertebral disc degeneration, lumbosacral region: Secondary | ICD-10-CM | POA: Diagnosis not present

## 2022-10-06 DIAGNOSIS — M79605 Pain in left leg: Secondary | ICD-10-CM | POA: Diagnosis not present

## 2022-10-06 LAB — BLADDER SCAN AMB NON-IMAGING

## 2022-10-06 NOTE — Progress Notes (Signed)
10/06/2022 3:51 PM   Ronald Wallace 03/26/1940 [id number]  CC: Chief Complaint  Patient presents with   Follow-up   Urinary Incontinence   HPI: Ronald Wallace is a 82 y.o. male with PMH dementia, BPH, and OAB wet previously on Myrbetriq who presents today for symptom recheck on Gemtesa.  He is accompanied today by his wife, who contributes to HPI.  Today they reports they initially thought the Ronald Wallace was helping, but now they are unsure.  He describes voiding 3 times daily with some urgency and dribbling.  His wife admits that his dementia makes symptom assessment more challenging.  He is hesitant to attempt a drug holiday because of how bad his symptoms are at baseline.  Notably, we previously stated that he was on maximum BPH pharmacotherapy with Flomax and finasteride, however per chart review it does not appear that he is on finasteride.  He is taking Flomax 0.8 mg daily.  Bladder scan on arrival , last void approximately 1 hour prior.  PMH: Past Medical History:  Diagnosis Date   Anemia of chronic disease 07/01/2015   Arthritis    Benign prostatic hyperplasia with nocturia 10/30/2016   Bradycardia 03/12/2014   Bruit of left carotid artery 03/12/2014   CKD (chronic kidney disease) 09/27/2015   stage 3, GFR 30-59 ml/min   Coronary artery disease involving native coronary artery of native heart without angina pectoris    a. 02/2014 Neg Ex MV, EF 65%;  b. 06/2015 Abnl MV ([facility])-->Cath (Parachos): LM nl, LAD 30p/m, LCX 30p, OM2 50p, RCA 100p w/ L-->R collats, nl EF-->Med Rx; c. 10/2017 MV: No isch/scar; d. 11/2017 Cath: LM nl, LAD 45p/m, LCX 30ost, OM2 30, RCA 100 CTO, L->R collats. PA 31/16/22, PCWP 11; e. 05/2020 MV: EF 67%, no isch/scar->low risk.   Diabetes mellitus, type II, insulin dependent    Diabetic neuropathy    Diabetic retinopathy associated with type 2 diabetes mellitus 10/28/2018   Disorder of bone and cartilage    Diverticulitis    DOE (dyspnea on  exertion) 03/12/2014   Generalized anxiety disorder    GERD (gastroesophageal reflux disease)    Hypertension associated with diabetes    Major depressive disorder    Major neurocognitive disorder due to multiple etiologies 08/04/2020   likely Alzheimer's disease w/vascular contributions   Mixed hyperlipidemia due to type 2 diabetes mellitus 02/06/2017   Obesity    Osteoporosis    Pure hypercholesterolemia    Syncope and collapse    a. 02/2014 Carotid Dopplers: 1-39% bilateral ICA with normal Subclavian & vertebral arteries; b. 02/2014 2 D Echo: EF 50-55%, Gr 1 DD, Mild MR & LA dilation -- was bradycardic in 40s; c. 04/2014 Event monitor: RSR/SA/SB. Occas PVC's. No AFib.    Surgical History: Past Surgical History:  Procedure Laterality Date   CARDIAC CATHETERIZATION Left 07/07/2015   Procedure: Left Heart Cath and Coronary Angiography;  Surgeon: Marcina Millard, MD;  Location: [facility] INVASIVE CV LAB;  Service: Cardiovascular;  Laterality: Left;   CARPAL TUNNEL RELEASE     CATARACT EXTRACTION Right    CATARACT EXTRACTION W/PHACO Left 10/06/2015   Procedure: CATARACT EXTRACTION PHACO AND INTRAOCULAR LENS PLACEMENT (IOC);  Surgeon: Sallee Lange, MD;  Location: [facility] ORS;  Service: Ophthalmology;  Laterality: Left;  [place] 01:42 AP% 25.1 CDE 45.92 Fluid pack lot # [id number]   COLONOSCOPY     COLONOSCOPY WITH PROPOFOL N/A 12/04/2018   Procedure: COLONOSCOPY WITH PROPOFOL;  Surgeon: Toledo, Boykin Nearing, MD;  Location: [facility] ENDOSCOPY;  Service: Gastroenterology;  Laterality: N/A;   NM MYOVIEW LTD  03/13/2014   LOW RISK - positive EKG but negative imaging: 2 mm ST Depression in V4 & V5 but No scintigraphic evidence of Ischemia or Infarct o   RIGHT/LEFT HEART CATH AND CORONARY ANGIOGRAPHY N/A 11/21/2017   Procedure: RIGHT/LEFT HEART CATH AND CORONARY ANGIOGRAPHY;  Surgeon: Antonieta Iba, MD;  Location: [facility] INVASIVE CV LAB;  Service: Cardiovascular;  Laterality: N/A;   TRANSTHORACIC  ECHOCARDIOGRAM  03/12/2014   EF 50-55%, Gr 1 DD, no RWMA, Mlid MR & LA dil, Normla RV and bradycardic 40s -    Home Medications:  Allergies as of 10/06/2022       Reactions   Amlodipine    Other reaction(s): Other (See Comments) Peripheral edema        Medication List        Accurate as of October 06, 2022  3:51 PM. If you have any questions, ask your nurse or doctor.          acetaminophen 325 MG tablet Commonly known as: TYLENOL Take 650 mg by mouth every 6 (six) hours as needed.   aspirin EC 81 MG tablet Take 81 mg by mouth daily.   calcium carbonate 600 MG Tabs tablet Commonly known as: OS-CAL Take 600 mg by mouth daily with breakfast.   cholecalciferol 1000 units tablet Commonly known as: VITAMIN D Take 2,000 Units by mouth daily.   cyanocobalamin 1000 MCG tablet Commonly known as: VITAMIN B12 Take 1,000 mcg by mouth daily.   donepezil 10 MG tablet Commonly known as: ARICEPT Take 1 tablet (10 mg total) by mouth daily.   escitalopram 20 MG tablet Commonly known as: Lexapro Take 1 and 1/2 tablets daily   ferrous sulfate 325 (65 FE) MG tablet Take 325 mg by mouth daily with breakfast.   FreeStyle Libre 2 Sensor Misc USE AS DIRECTED - REPLACE EVERY 14 DAYS   gabapentin 300 MG capsule Commonly known as: NEURONTIN Take 300-600 mg by mouth See admin instructions. Take 1 capsule (300 mg) by mouth in the morning & 2 capsules (600 mg) by mouth every night.   Gemtesa 75 MG Tabs Generic drug: Vibegron Take 1 tablet (75 mg total) by mouth daily.   hydrALAZINE 25 MG tablet Commonly known as: APRESOLINE Take 1 tablet by mouth 3 (three) times daily.   insulin detemir 100 UNIT/ML injection Commonly known as: LEVEMIR Inject 50 Units into the skin daily.   isosorbide mononitrate 30 MG 24 hr tablet Commonly known as: IMDUR Take 1 tablet by mouth once daily   levothyroxine 50 MCG tablet Commonly known as: SYNTHROID Take 50 mcg by mouth daily before  breakfast.   metFORMIN 500 MG tablet Commonly known as: GLUCOPHAGE Take 250 mg by mouth 2 (two) times daily with a meal.   NovoLOG 100 UNIT/ML injection Generic drug: insulin aspart Inject 20-38 Units into the skin 3 (three) times daily with meals. Sliding scale insulin base rates= 20 units in the morning, 26 units with lunch, 26 units with supper.   simvastatin 20 MG tablet Commonly known as: ZOCOR Take 20 mg by mouth daily.   tamsulosin 0.4 MG Caps capsule Commonly known as: FLOMAX Take 0.4 mg by mouth daily. Takes 2 each morning.   traMADol 50 MG tablet Commonly known as: ULTRAM Take 25-50 mg by mouth 2 (two) times daily as needed.   Evaristo Bury FlexTouch 100 UNIT/ML FlexTouch Pen Generic drug: insulin degludec SMARTSIG:25 Unit(s) SUB-Q Daily  Allergies:  Allergies  Allergen Reactions   Amlodipine     Other reaction(s): Other (See Comments) Peripheral edema    Family History: Family History  Problem Relation Age of Onset   Heart attack Mother    Breast cancer Mother    Lung cancer Father     Social History:   reports that he quit smoking about 59 years ago. His smoking use included pipe. He has never been exposed to tobacco smoke. He has never used smokeless tobacco. He reports that he does not drink alcohol and does not use drugs.  Physical Exam: BP 129/84   Pulse 61   Ht 5\' 5"  (1.651 m)   Wt 202 lb (91.6 kg)   BMI 33.61 kg/m   Constitutional:  Alert and oriented, no acute distress, nontoxic appearing HEENT: [address], AT Cardiovascular: No clubbing, cyanosis, or edema Respiratory: Normal respiratory effort, no increased work of breathing Skin: No rashes, bruises or suspicious lesions Neurologic: Grossly intact, no focal deficits, moving all 4 extremities Psychiatric: Normal mood and affect  Laboratory Data: Results for orders placed or performed in visit on 10/06/22  BLADDER SCAN AMB NON-IMAGING  Result Value Ref Range   Scan Result     Assessment & Plan:   1. Urinary dribbling Unclear symptomatic improvement on Gemtesa, though his hesitancy to attempt a drug holiday makes me suspect that it is working for him.  Will plan to continue Gemtesa.  Bladder scan is appropriate given duration since last void.  He is scheduled to follow-up with me in February, will plan for IPSS and PVR at that time.  If he is having more obstructive symptoms, will encourage starting finasteride. - BLADDER SCAN AMB NON-IMAGING  Return if symptoms worsen or fail to improve.  Carman Ching, PA-C  [facility] [place] [address], [address] [place], [place] [address] [phone]

## 2022-10-08 DIAGNOSIS — R001 Bradycardia, unspecified: Secondary | ICD-10-CM | POA: Diagnosis not present

## 2022-10-11 ENCOUNTER — Other Ambulatory Visit: Payer: Self-pay | Admitting: Sports Medicine

## 2022-10-11 DIAGNOSIS — M79604 Pain in right leg: Secondary | ICD-10-CM

## 2022-10-11 DIAGNOSIS — M51369 Other intervertebral disc degeneration, lumbar region without mention of lumbar back pain or lower extremity pain: Secondary | ICD-10-CM

## 2022-10-11 DIAGNOSIS — M4807 Spinal stenosis, lumbosacral region: Secondary | ICD-10-CM

## 2022-10-11 DIAGNOSIS — M51379 Other intervertebral disc degeneration, lumbosacral region without mention of lumbar back pain or lower extremity pain: Secondary | ICD-10-CM

## 2022-10-11 DIAGNOSIS — M5137 Other intervertebral disc degeneration, lumbosacral region: Secondary | ICD-10-CM

## 2022-10-11 DIAGNOSIS — M48061 Spinal stenosis, lumbar region without neurogenic claudication: Secondary | ICD-10-CM

## 2022-10-11 DIAGNOSIS — G8929 Other chronic pain: Secondary | ICD-10-CM

## 2022-10-11 DIAGNOSIS — M5136 Other intervertebral disc degeneration, lumbar region: Secondary | ICD-10-CM

## 2022-10-11 DIAGNOSIS — M5116 Intervertebral disc disorders with radiculopathy, lumbar region: Secondary | ICD-10-CM

## 2022-10-11 DIAGNOSIS — M47816 Spondylosis without myelopathy or radiculopathy, lumbar region: Secondary | ICD-10-CM

## 2022-10-12 ENCOUNTER — Telehealth: Payer: Self-pay

## 2022-10-12 ENCOUNTER — Ambulatory Visit: Payer: PPO | Attending: Student in an Organized Health Care Education/Training Program

## 2022-10-12 DIAGNOSIS — R0609 Other forms of dyspnea: Secondary | ICD-10-CM | POA: Insufficient documentation

## 2022-10-12 DIAGNOSIS — R942 Abnormal results of pulmonary function studies: Secondary | ICD-10-CM | POA: Diagnosis not present

## 2022-10-12 DIAGNOSIS — R0602 Shortness of breath: Secondary | ICD-10-CM | POA: Diagnosis not present

## 2022-10-12 DIAGNOSIS — J984 Other disorders of lung: Secondary | ICD-10-CM | POA: Insufficient documentation

## 2022-10-12 LAB — PULMONARY FUNCTION TEST ARMC ONLY
DL/VA % pred: 78 %
DL/VA: 3.06 ml/min/mmHg/L
DLCO unc % pred: 57 %
DLCO unc: 13.12 ml/min/mmHg
FEF 25-75 Post: 1.62 L/sec
FEF 25-75 Pre: 1.58 L/sec
FEF2575-%Change-Post: 2 %
FEF2575-%Pred-Post: 90 %
FEF2575-%Pred-Pre: 88 %
FEV1-%Change-Post: 0 %
FEV1-%Pred-Post: 71 %
FEV1-%Pred-Pre: 71 %
FEV1-Post: 1.87 L
FEV1-Pre: 1.87 L
FEV1FVC-%Change-Post: 6 %
FEV1FVC-%Pred-Pre: 109 %
FEV6-%Change-Post: -6 %
FEV6-%Pred-Post: 65 %
FEV6-%Pred-Pre: 69 %
FEV6-Post: 2.26 L
FEV6-Pre: 2.4 L
FEV6FVC-%Pred-Post: 107 %
FEV6FVC-%Pred-Pre: 107 %
FVC-%Change-Post: -6 %
FVC-%Pred-Post: 60 %
FVC-%Pred-Pre: 65 %
FVC-Post: 2.26 L
FVC-Pre: 2.4 L
Post FEV1/FVC ratio: 83 %
Post FEV6/FVC ratio: 100 %
Pre FEV1/FVC ratio: 78 %
Pre FEV6/FVC Ratio: 100 %
RV % pred: 86 %
RV: 2.19 L
TLC % pred: 70 %
TLC: 4.69 L

## 2022-10-12 MED ORDER — ALBUTEROL SULFATE (2.5 MG/3ML) 0.083% IN NEBU
2.5000 mg | INHALATION_SOLUTION | Freq: Once | RESPIRATORY_TRACT | Status: AC
  Administered 2022-10-12: 2.5 mg via RESPIRATORY_TRACT
  Filled 2022-10-12: qty 3

## 2022-10-12 NOTE — Telephone Encounter (Signed)
Lm for Brad with [employer] to request CPAP DL.

## 2022-10-13 ENCOUNTER — Ambulatory Visit: Payer: PPO | Admitting: Student in an Organized Health Care Education/Training Program

## 2022-10-13 DIAGNOSIS — E1122 Type 2 diabetes mellitus with diabetic chronic kidney disease: Secondary | ICD-10-CM | POA: Diagnosis not present

## 2022-10-13 DIAGNOSIS — E1159 Type 2 diabetes mellitus with other circulatory complications: Secondary | ICD-10-CM | POA: Diagnosis not present

## 2022-10-13 DIAGNOSIS — G4733 Obstructive sleep apnea (adult) (pediatric): Secondary | ICD-10-CM | POA: Diagnosis not present

## 2022-10-13 DIAGNOSIS — F330 Major depressive disorder, recurrent, mild: Secondary | ICD-10-CM | POA: Diagnosis not present

## 2022-10-13 DIAGNOSIS — Z66 Do not resuscitate: Secondary | ICD-10-CM | POA: Diagnosis not present

## 2022-10-13 DIAGNOSIS — Z515 Encounter for palliative care: Secondary | ICD-10-CM | POA: Diagnosis not present

## 2022-10-13 DIAGNOSIS — G8929 Other chronic pain: Secondary | ICD-10-CM | POA: Diagnosis not present

## 2022-10-13 DIAGNOSIS — F0390 Unspecified dementia without behavioral disturbance: Secondary | ICD-10-CM | POA: Diagnosis not present

## 2022-10-13 DIAGNOSIS — E1165 Type 2 diabetes mellitus with hyperglycemia: Secondary | ICD-10-CM | POA: Diagnosis not present

## 2022-10-13 DIAGNOSIS — Z6832 Body mass index (BMI) 32.0-32.9, adult: Secondary | ICD-10-CM | POA: Diagnosis not present

## 2022-10-13 NOTE — Telephone Encounter (Signed)
Spoke to [place] with [employer]. He has sent a message to cpap team to request download.  Will await fax.

## 2022-10-13 NOTE — Telephone Encounter (Signed)
Download received. Nothing further needed

## 2022-10-14 DIAGNOSIS — R001 Bradycardia, unspecified: Secondary | ICD-10-CM | POA: Diagnosis not present

## 2022-10-18 ENCOUNTER — Encounter: Payer: Self-pay | Admitting: Student in an Organized Health Care Education/Training Program

## 2022-10-18 ENCOUNTER — Ambulatory Visit: Payer: PPO | Admitting: Student in an Organized Health Care Education/Training Program

## 2022-10-18 VITALS — BP 126/66 | HR 50 | Temp 97.8°F | Ht 65.0 in | Wt 204.4 lb

## 2022-10-18 DIAGNOSIS — R0602 Shortness of breath: Secondary | ICD-10-CM | POA: Diagnosis not present

## 2022-10-18 DIAGNOSIS — G4733 Obstructive sleep apnea (adult) (pediatric): Secondary | ICD-10-CM | POA: Diagnosis not present

## 2022-10-18 DIAGNOSIS — I5032 Chronic diastolic (congestive) heart failure: Secondary | ICD-10-CM | POA: Diagnosis not present

## 2022-10-18 NOTE — Progress Notes (Signed)
Assessment & Plan:   1. Shortness of breath 2. OSA (obstructive sleep apnea) 3. HFpEF   He is presenting for the evaluation of cough and dyspnea. Workup has included a chest CT that does not show any parenchymal lung abnormalities, and on exam his lungs are clear. PFT's are mostly notable for drop in TLC and in DLCO, with very minimal signs of obstruction on the flow volume loop. His exam is also notable for significant bilateral lower extremity pitting edema and the patient reports significant salt intake (cold cuts, frozen meals, etc...).  Given his history of diastolic dysfunction (grade II) and findings of decompensated heart failure on exam (lower extremity edema), I suspect that his symptom burden is mostly driven by decompensated HFpEF. I have counseled the patient on the importance of salt avoidance and recommended that he follow up with cardiology for initiation of diuresis. I will also place a referral to our CHF clinic. Would recommend right heart catheterization should there remain a diagnostic uncertainty regarding heart failure to assess his PAOP.  At this point, will hold off on initiation of bronchodilator therapy, and will consider this should cardiac workup and diuresis not yield significant improvement in symptoms.    - Continue with CPAP - AMB referral to CHF clinic   Return in about 6 months (around 04/18/2023).  I spent 30 minutes caring for this patient today, including preparing to see the patient, obtaining a medical history , reviewing a separately obtained history, performing a medically appropriate examination and/or evaluation, counseling and educating the patient/family/caregiver, ordering medications, tests, or procedures, and documenting clinical information in the electronic health record  Raechel Chute, MD [facility] 10/18/2022 3:48 PM    End of visit medications:  No orders of the defined types were placed in this  encounter.    Current Outpatient Medications:    acetaminophen (TYLENOL) 325 MG tablet, Take 650 mg by mouth every 6 (six) hours as needed., Disp: , Rfl:    aspirin EC 81 MG tablet, Take 81 mg by mouth daily., Disp: , Rfl:    calcium carbonate (OS-CAL) 600 MG TABS tablet, Take 600 mg by mouth daily with breakfast., Disp: , Rfl:    cholecalciferol (VITAMIN D) 1000 UNITS tablet, Take 2,000 Units by mouth daily. , Disp: , Rfl:    Continuous Blood Gluc Sensor (FREESTYLE LIBRE 2 SENSOR) MISC, USE AS DIRECTED - REPLACE EVERY 14 DAYS, Disp: , Rfl:    cyanocobalamin (VITAMIN B12) 1000 MCG tablet, Take 1,000 mcg by mouth daily., Disp: , Rfl:    donepezil (ARICEPT) 10 MG tablet, Take 1 tablet (10 mg total) by mouth daily., Disp: 90 tablet, Rfl: 3   escitalopram (LEXAPRO) 20 MG tablet, Take 1 and 1/2 tablets daily, Disp: 135 tablet, Rfl: 3   ferrous sulfate 325 (65 FE) MG tablet, Take 325 mg by mouth daily with breakfast., Disp: , Rfl:    gabapentin (NEURONTIN) 300 MG capsule, Take 300-600 mg by mouth See admin instructions. Take 1 capsule (300 mg) by mouth in the morning & 2 capsules (600 mg) by mouth every night., Disp: , Rfl:    hydrALAZINE (APRESOLINE) 25 MG tablet, Take 1 tablet by mouth 3 (three) times daily., Disp: , Rfl:    insulin detemir (LEVEMIR) 100 UNIT/ML injection, Inject 50 Units into the skin daily., Disp: , Rfl:    isosorbide mononitrate (IMDUR) 30 MG 24 hr tablet, Take 1 tablet by mouth once daily, Disp: 90 tablet, Rfl: 3  levothyroxine (SYNTHROID) 50 MCG tablet, Take 50 mcg by mouth daily before breakfast., Disp: , Rfl:    metFORMIN (GLUCOPHAGE) 500 MG tablet, Take 250 mg by mouth 2 (two) times daily with a meal., Disp: , Rfl:    NOVOLOG 100 UNIT/ML injection, Inject 20-38 Units into the skin 3 (three) times daily with meals. Sliding scale insulin base rates= 20 units in the morning, 26 units with lunch, 26 units with supper., Disp: , Rfl:    simvastatin (ZOCOR) 20 MG tablet, Take 20  mg by mouth daily., Disp: , Rfl:    tamsulosin (FLOMAX) 0.4 MG CAPS capsule, Take 0.4 mg by mouth daily. Takes 2 each morning., Disp: , Rfl:    traMADol (ULTRAM) 50 MG tablet, Take 25-50 mg by mouth 2 (two) times daily as needed., Disp: , Rfl:    TRESIBA FLEXTOUCH 100 UNIT/ML FlexTouch Pen, SMARTSIG:25 Unit(s) SUB-Q Daily, Disp: , Rfl:    Vibegron (GEMTESA) 75 MG TABS, Take 1 tablet (75 mg total) by mouth daily., Disp: 30 tablet, Rfl: 11   Subjective:   PATIENT ID: Ronald Wallace GENDER: male DOB: 07-Jul-1940, MRN: [id number]  Chief Complaint  Patient presents with   Follow-up    Wheezing. Shortness of breath on exertion.     HPI  Patient is a pleasant 82 year old male presenting to clinic for follow up. I initially saw him in June of 2024.  Patient's symptoms remain unchanged. He continues to have shortness of breath with exertion as well as laying flat. Cough is stable and is non-productive. Since his initial visit, he's had a chest CT and pulmonary function testing for workup. Patient was also started on CPAP secondary to sleep apnea diagnosed on home sleep study. They report not tolerating the face mask, and he switched to a nasal mask which is better tolerated.  Initial history obtained in June was notable for exertional dyspnea and a nocturnal cough. There was also a report of wheeze. He had denied chest pain, chest tightness, sputum production, night sweats, or weight loss.    Patient previously worked as a Human resources officer for [employer].  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  He has a very distant history of smoking a pipe and quit in 1965.  No other occupational exposures are reported.  Ancillary information including prior medications, full medical/surgical/family/social histories, and PFTs (when available) are listed below and have been reviewed.   Review of Systems  Constitutional:  Negative for chills, fever, malaise/fatigue and weight loss.   Respiratory:  Positive for cough and shortness of breath. Negative for hemoptysis, sputum production and wheezing.   Cardiovascular:  Negative for chest pain and palpitations.     Objective:   Vitals:   10/18/22 1531  BP: 126/66  Pulse: (!) 50  Temp: 97.8 F (36.6 C)  TempSrc: Temporal  SpO2: 96%  Weight: 204 lb 6.4 oz (92.7 kg)  Height: 5\' 5"  (1.651 m)   96% on RA  BMI Readings from Last 3 Encounters:  10/18/22 34.01 kg/m  10/06/22 33.61 kg/m  09/24/22 34.17 kg/m   Wt Readings from Last 3 Encounters:  10/18/22 204 lb 6.4 oz (92.7 kg)  10/06/22 202 lb (91.6 kg)  09/24/22 199 lb 1.2 oz (90.3 kg)    Physical Exam Constitutional:      Appearance: Normal appearance. He is obese.  Cardiovascular:     Rate and Rhythm: Normal rate and regular rhythm.     Pulses: Normal pulses.  Heart sounds: Normal heart sounds.  Pulmonary:     Effort: Pulmonary effort is normal. No respiratory distress.     Breath sounds: Normal breath sounds. No wheezing or rales.     Comments: Attempted to have the patient lay flat on the examination table, and his lung exam was clear without any wheezing and we were unable to reproduce the symptoms.  Patient did start to fall asleep, however. Abdominal:     General: There is distension.     Palpations: Abdomen is soft.     Hernia: A hernia (Abdominal hernia) is present.  Musculoskeletal:     Right lower leg: Edema present.     Left lower leg: Edema present.  Neurological:     General: No focal deficit present.     Mental Status: He is alert.       Ancillary Information    Past Medical History:  Diagnosis Date   Anemia of chronic disease 07/01/2015   Arthritis    Benign prostatic hyperplasia with nocturia 10/30/2016   Bradycardia 03/12/2014   Bruit of left carotid artery 03/12/2014   CKD (chronic kidney disease) 09/27/2015   stage 3, GFR 30-59 ml/min   Coronary artery disease involving native coronary artery of native heart  without angina pectoris    a. 02/2014 Neg Ex MV, EF 65%;  b. 06/2015 Abnl MV ([facility])-->Cath ([facility]): LM nl, LAD 30p/m, LCX 30p, OM2 50p, RCA 100p w/ L-->R collats, nl EF-->Med Rx; c. 10/2017 MV: No isch/scar; d. 11/2017 Cath: LM nl, LAD 45p/m, LCX 30ost, OM2 30, RCA 100 CTO, L->R collats. PA 31/16/22, PCWP 11; e. 05/2020 MV: EF 67%, no isch/scar->low risk.   Diabetes mellitus, type II, insulin dependent    Diabetic neuropathy    Diabetic retinopathy associated with type 2 diabetes mellitus 10/28/2018   Disorder of bone and cartilage    Diverticulitis    DOE (dyspnea on exertion) 03/12/2014   Generalized anxiety disorder    GERD (gastroesophageal reflux disease)    Hypertension associated with diabetes    Major depressive disorder    Major neurocognitive disorder due to multiple etiologies 08/04/2020   likely Alzheimer's disease w/vascular contributions   Mixed hyperlipidemia due to type 2 diabetes mellitus 02/06/2017   Obesity    Osteoporosis    Pure hypercholesterolemia    Syncope and collapse    a. 02/2014 Carotid Dopplers: 1-39% bilateral ICA with normal Subclavian & vertebral arteries; b. 02/2014 2 D Echo: EF 50-55%, Gr 1 DD, Mild MR & LA dilation -- was bradycardic in 40s; c. 04/2014 Event monitor: RSR/SA/SB. Occas PVC's. No AFib.     Family History  Problem Relation Age of Onset   Heart attack Mother    Breast cancer Mother    Lung cancer Father      Past Surgical History:  Procedure Laterality Date   CARDIAC CATHETERIZATION Left 07/07/2015   Procedure: Left Heart Cath and Coronary Angiography;  Surgeon: Marcina Millard, MD;  Location: [facility];  Service: Cardiovascular;  Laterality: Left;   CARPAL TUNNEL RELEASE     CATARACT EXTRACTION Right    CATARACT EXTRACTION W/PHACO Left 10/06/2015   Procedure: CATARACT EXTRACTION PHACO AND INTRAOCULAR LENS PLACEMENT (IOC);  Surgeon: Sallee Lange, MD;  Location: [facility] OR[facility];  Service: Ophthalmology;  Laterality: Left;   [place] 01:42 AP% 25.1 CDE 45.92 Fluid pack lot # [id number]   COLONOSCOPY     COLONOSCOPY WITH PROPOFOL N/A 12/04/2018   Procedure: COLONOSCOPY WITH PROPOFOL;  Surgeon: [place], [place]  K, MD;  Location: [facility];  Service: Gastroenterology;  Laterality: N/A;   NM MYOVIEW LTD  03/13/2014   LOW RISK - positive EKG but negative imaging: 2 mm ST Depression in V4 & V5 but No scintigraphic evidence of Ischemia or Infarct o   RIGHT/LEFT HEART CATH AND CORONARY ANGIOGRAPHY N/A 11/21/2017   Procedure: RIGHT/LEFT HEART CATH AND CORONARY ANGIOGRAPHY;  Surgeon: Antonieta Iba, MD;  Location: [facility];  Service: Cardiovascular;  Laterality: N/A;   TRANSTHORACIC ECHOCARDIOGRAM  03/12/2014   EF 50-55%, Gr 1 DD, no RWMA, Mlid MR & LA dil, Normla RV and bradycardic 40s -    Social History   Socioeconomic History   Marital status: Married    Spouse name: Not on file   Number of children: Not on file   Years of education: 14   Highest education level: Associate degree: academic program  Occupational History   Occupation: Retired    Comment: Engineer, maintenance  Tobacco Use   Smoking status: Former    Types: Pipe    Quit date: 1965    Years since quitting: 59.7    Passive exposure: Never   Smokeless tobacco: Never  Vaping Use   Vaping status: Never Used  Substance and Sexual Activity   Alcohol use: No   Drug use: No   Sexual activity: Not on file  Other Topics Concern   Not on file  Social History Narrative   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   Married - at least 1 daughter.   Never Smoked   Usually very active, but no exercise routine.   Right handed    Social Determinants of Health   Financial Resource Strain: Low Risk  (09/13/2022)   Received [facility]   Overall Financial Resource Strain (CARDIA)    Difficulty of Paying Living Expenses: Not hard at all  Food Insecurity: No Food Insecurity (09/13/2022)   Received from [facility]   Hunger Vital Sign    Worried About Running Out of Food in the Last Year: Never true    Ran Out of Food in the Last Year: Never true  Transportation Needs: No Transportation Needs (09/13/2022)   [facility] from [facility]    In the past 12 months, has lack of transportation kept you from medical appointments or from getting medications?: No    Lack of Transportation (Non-Medical): No  Physical Activity: Not on file  Stress: Not on file  Social Connections: Not on file  Intimate Partner Violence: Not on file     Allergies  Allergen Reactions   Amlodipine     Other reaction(s): Other (See Comments) Peripheral edema     CBC    Component Value Date/Time   WBC 7.4 06/18/2019 1154   RBC 3.57 (L) 06/18/2019 1154   HGB 10.9 (L) 06/18/2019 1154   HGB 12.2 (L) 02/15/2014 1328   HCT 32.0 (L) 06/18/2019 1154   HCT 36.7 (L) 02/15/2014 1328   PLT 151.0 06/18/2019 1154   PLT 176 02/15/2014 1328   MCV 89.5 06/18/2019 1154   MCV 87 02/15/2014 1328   MCH 30.3 11/20/2017 0859   MCHC 34.1 06/18/2019 1154   RDW 12.1 06/18/2019 1154   RDW 12.7 02/15/2014 1328    Pulmonary Functions Testing Results:    Latest Ref Rng & Units 10/12/2022   11:36 AM  PFT Results  FVC-Pre L 2.40   FVC-Predicted  Pre % 65   FVC-Post L 2.26   FVC-Predicted Post % 60   Pre FEV1/FVC % % 78   Post FEV1/FCV % % 83   FEV1-Pre L 1.87   FEV1-Predicted Pre % 71   FEV1-Post L 1.87   DLCO uncorrected ml/min/mmHg 13.12   DLCO UNC% % 57   DLVA Predicted % 78   TLC L 4.69   TLC % Predicted % 70   RV % Predicted % 86     Outpatient Medications Prior to Visit  Medication Sig Dispense Refill   acetaminophen (TYLENOL) 325 MG tablet Take 650 mg by mouth every 6 (six) hours as needed.     aspirin EC 81 MG tablet Take 81 mg by mouth daily.     calcium carbonate (OS-CAL) 600 MG TABS tablet Take 600 mg by mouth daily with breakfast.     cholecalciferol (VITAMIN D)  1000 UNITS tablet Take 2,000 Units by mouth daily.      Continuous Blood Gluc Sensor (FREESTYLE LIBRE 2 SENSOR) MISC USE AS DIRECTED - REPLACE EVERY 14 DAYS     cyanocobalamin (VITAMIN B12) 1000 MCG tablet Take 1,000 mcg by mouth daily.     donepezil (ARICEPT) 10 MG tablet Take 1 tablet (10 mg total) by mouth daily. 90 tablet 3   escitalopram (LEXAPRO) 20 MG tablet Take 1 and 1/2 tablets daily 135 tablet 3   ferrous sulfate 325 (65 FE) MG tablet Take 325 mg by mouth daily with breakfast.     gabapentin (NEURONTIN) 300 MG capsule Take 300-600 mg by mouth See admin instructions. Take 1 capsule (300 mg) by mouth in the morning & 2 capsules (600 mg) by mouth every night.     hydrALAZINE (APRESOLINE) 25 MG tablet Take 1 tablet by mouth 3 (three) times daily.     insulin detemir (LEVEMIR) 100 UNIT/ML injection Inject 50 Units into the skin daily.     isosorbide mononitrate (IMDUR) 30 MG 24 hr tablet Take 1 tablet by mouth once daily 90 tablet 3   levothyroxine (SYNTHROID) 50 MCG tablet Take 50 mcg by mouth daily before breakfast.     metFORMIN (GLUCOPHAGE) 500 MG tablet Take 250 mg by mouth 2 (two) times daily with a meal.     NOVOLOG 100 UNIT/ML injection Inject 20-38 Units into the skin 3 (three) times daily with meals. Sliding scale insulin base rates= 20 units in the morning, 26 units with lunch, 26 units with supper.     simvastatin (ZOCOR) 20 MG tablet Take 20 mg by mouth daily.     tamsulosin (FLOMAX) 0.4 MG CAPS capsule Take 0.4 mg by mouth daily. Takes 2 each morning.     traMADol (ULTRAM) 50 MG tablet Take 25-50 mg by mouth 2 (two) times daily as needed.     TRESIBA FLEXTOUCH 100 UNIT/ML FlexTouch Pen SMARTSIG:25 Unit(s) SUB-Q Daily     Vibegron (GEMTESA) 75 MG TABS Take 1 tablet (75 mg total) by mouth daily. 30 tablet 11   No facility-administered medications prior to visit.

## 2022-10-24 ENCOUNTER — Telehealth: Payer: Self-pay | Admitting: Cardiovascular Disease

## 2022-10-24 DIAGNOSIS — D044 Carcinoma in situ of skin of scalp and neck: Secondary | ICD-10-CM | POA: Diagnosis not present

## 2022-10-24 NOTE — Progress Notes (Unsigned)
This cardiology Office Note  Date:  10/25/2022   ID:  Ronald Wallace, DOB [birth year], MRN [id number]  PCP:  Marisue Ivan, MD   Chief Complaint  Patient presents with   Acute Visit    Pt c/o SOB, chest pain and dizziness x several months. Referred by pulmonologist. Medications reviewed verbally.     HPI:  [name] is a 82 y/o male with a prior h/o  syncope,  HTN,  HL,   DM, remote smoking, pipe, , no cigarettes increasing dyspnea and underwent stress testing @ [facility], which was abnl,   CAD cath in late June 2017 revealing moderate LAD and LCX/OM dzs with a total occlusion of the prox RCA,  L  R collaterals.   LV fxn was nl,  Right and left heart catheterization 2019 for shortness of breath Chronic shortness of breath who presents for routine follow-up of his coronary artery disease,  chronic shortness of breath  Last seen in clinic by myself March 2024 Recently seen by pulmonary October 2024 Chronic shortness of breath dating back many years Concern for lower extremity edema He was recommended to decrease his salt intake  Echocardiogram January 2024 normal left and right ventricular size and function Normal right heart pressures  Reports in August was seen by primary care, prescribed Lasix 20 mg daily as needed for leg swelling Has taken Lasix twice since August  Appreciates strong heartbeat after he exerts himself in the house, tires out quickly, has to sit down Reports he is very sedentary at baseline Left leg hurts on ambulation limiting his ability to exert himself On gabapentin Uses cane  Has CPAP  Labs reviewed Aic 8.6 Total chol 96, LDL 39  EKG personally reviewed by myself on todays visit EKG Interpretation Date/Time:  Wednesday October 25 2022 12:06:28 EDT Ventricular Rate:  44 PR Interval:  202 QRS Duration:  82 QT Interval:  478 QTC Calculation: 408 R Axis:   21  Text Interpretation: Marked sinus bradycardia When compared  with ECG of 24-Sep-2022 18:26, No significant change was found Confirmed by Julien Nordmann [id number]) on 10/25/2022 12:19:28 PM   Cardiac imaging reviewed Echocardiogram January 2024  Normal left and right ventricular size and function No significant valvular heart disease, normal pressures measured  Event monitor Normal rhythm with short runs of tachycardia, these were not triggered for symptoms Patient triggered symptoms associated with normal rhythm  Myoview 05/2020 Normal pharmacologic myocardial perfusion stress test without significant ischemia or scar. This is a low risk study.  cardiac catheterization November 2019 for shortness of breath  right and left heart cath   Right heart pressures were within normal range RA: mean 11, RV 39/10/15, PA: 31/16/22, Wedge: 11  Mild to moderate mid LAD disease, mild proximal and mid LCX disease, Occluded proximal RCA with left to right collaterals Normal right heart pressures Prox LAD to Mid LAD lesion is 45% stenosed. 2nd Mrg lesion is 30% stenosed. Ost Cx to Prox Cx lesion is 30% stenosed. Prox RCA to Dist RCA lesion is 100% stenosed.     other past medical history reviewed  catheterization  June 2017 discussed with him, 30% LAD disease, occluded RCA with collaterals  02/2014 Carotid Dopplers: 1-39% bilateral ICA with normal Subclavian & vertebral arteries; b. 02/2014 2 D Echo: EF 50-55%, Gr 1 DD, Mild MR & LA dilation -- was bradycardic in 40s  02/2014 Neg Ex MV, EF 65%;  b. 06/2015 Abnl MV ([facility])-->Cath ([facility]): LM nl, LAD 30p/m, LCX  30p, OM2 50p, RCA 100p w/ L-->R collats, nl EF-->Med Rx.    PMH:   has a past medical history of Anemia of chronic disease (07/01/2015), Arthritis, Benign prostatic hyperplasia with nocturia (10/30/2016), Bradycardia (03/12/2014), Bruit of left carotid artery (03/12/2014), CKD (chronic kidney disease) (09/27/2015), Coronary artery disease involving native coronary artery of native heart without angina  pectoris, Diabetes mellitus, type II, insulin dependent, Diabetic neuropathy, Diabetic retinopathy associated with type 2 diabetes mellitus (10/28/2018), Disorder of bone and cartilage, Diverticulitis, DOE (dyspnea on exertion) (03/12/2014), Generalized anxiety disorder, GERD (gastroesophageal reflux disease), Hypertension associated with diabetes, Major depressive disorder, Major neurocognitive disorder due to multiple etiologies (08/04/2020), Mixed hyperlipidemia due to type 2 diabetes mellitus (02/06/2017), Obesity, Osteoporosis, Pure hypercholesterolemia, and Syncope and collapse.  PSH:    Past Surgical History:  Procedure Laterality Date   CARDIAC CATHETERIZATION Left 07/07/2015   Procedure: Left Heart Cath and Coronary Angiography;  Surgeon: Marcina Millard, MD;  Location: [facility] INVASIVE CV LAB;  Service: Cardiovascular;  Laterality: Left;   CARPAL TUNNEL RELEASE     CATARACT EXTRACTION Right    CATARACT EXTRACTION W/PHACO Left 10/06/2015   Procedure: CATARACT EXTRACTION PHACO AND INTRAOCULAR LENS PLACEMENT (IOC);  Surgeon: Sallee Lange, MD;  Location: [facility] ORS;  Service: Ophthalmology;  Laterality: Left;  [place] 01:42 AP% 25.1 CDE 45.92 Fluid pack lot # [id number]   COLONOSCOPY     COLONOSCOPY WITH PROPOFOL N/A 12/04/2018   Procedure: COLONOSCOPY WITH PROPOFOL;  Surgeon: Toledo, Boykin Nearing, MD;  Location: [facility] ENDOSCOPY;  Service: Gastroenterology;  Laterality: N/A;   NM MYOVIEW LTD  03/13/2014   LOW RISK - positive EKG but negative imaging: 2 mm ST Depression in V4 & V5 but No scintigraphic evidence of Ischemia or Infarct o   RIGHT/LEFT HEART CATH AND CORONARY ANGIOGRAPHY N/A 11/21/2017   Procedure: RIGHT/LEFT HEART CATH AND CORONARY ANGIOGRAPHY;  Surgeon: Antonieta Iba, MD;  Location: [facility] INV[facility]IVE CV LAB;  Service: Cardiovascular;  Laterality: N/A;   TRANSTHORACIC ECHOCARDIOGRAM  03/12/2014   EF 50-55%, Gr 1 DD, no RWMA, Mlid MR & LA dil, Normla RV and bradycardic 40s -    Current  Outpatient Medications  Medication Sig Dispense Refill   acetaminophen (TYLENOL) 325 MG tablet Take 650 mg by mouth every 6 (six) hours as needed.     aspirin EC 81 MG tablet Take 81 mg by mouth daily.     calcium carbonate (OS-CAL) 600 MG TABS tablet Take 600 mg by mouth daily with breakfast.     cholecalciferol (VITAMIN D) 1000 UNITS tablet Take 2,000 Units by mouth daily.      Continuous Blood Gluc Sensor (FREESTYLE LIBRE 2 SENSOR) MISC USE AS DIRECTED - REPLACE EVERY 14 DAYS     cyanocobalamin (VITAMIN B12) 1000 MCG tablet Take 1,000 mcg by mouth daily.     donepezil (ARICEPT) 10 MG tablet Take 1 tablet (10 mg total) by mouth daily. 90 tablet 3   escitalopram (LEXAPRO) 20 MG tablet Take 1 and 1/2 tablets daily 135 tablet 3   ferrous sulfate 325 (65 FE) MG tablet Take 325 mg by mouth daily with breakfast.     gabapentin (NEURONTIN) 300 MG capsule Take 300-600 mg by mouth See admin instructions. Take 1 capsule (300 mg) by mouth in the morning & 2 capsules (600 mg) by mouth every night.     hydrALAZINE (APRESOLINE) 25 MG tablet Take 1 tablet by mouth 3 (three) times daily.     insulin detemir (LEVEMIR) 100 UNIT/ML injection Inject 50  Units into the skin daily.     isosorbide mononitrate (IMDUR) 30 MG 24 hr tablet Take 1 tablet by mouth once daily 90 tablet 3   levothyroxine (SYNTHROID) 50 MCG tablet Take 50 mcg by mouth daily before breakfast.     metFORMIN (GLUCOPHAGE) 500 MG tablet Take 250 mg by mouth 2 (two) times daily with a meal.     NOVOLOG 100 UNIT/ML injection Inject 20-38 Units into the skin 3 (three) times daily with meals. Sliding scale insulin base rates= 20 units in the morning, 26 units with lunch, 26 units with supper.     simvastatin (ZOCOR) 20 MG tablet Take 20 mg by mouth daily.     tamsulosin (FLOMAX) 0.4 MG CAPS capsule Take 0.4 mg by mouth daily. Takes 2 each morning.     traMADol (ULTRAM) 50 MG tablet Take 25-50 mg by mouth 2 (two) times daily as needed.     TRESIBA  FLEXTOUCH 100 UNIT/ML FlexTouch Pen SMARTSIG:25 Unit(s) SUB-Q Daily     Vibegron (GEMTESA) 75 MG TABS Take 1 tablet (75 mg total) by mouth daily. 30 tablet 11   No current facility-administered medications for this visit.    Allergies:   Amlodipine   Social History:  The patient  reports that he quit smoking about 59 years ago. His smoking use included pipe. He has never been exposed to tobacco smoke. He has never used smokeless tobacco. He reports that he does not drink alcohol and does not use drugs.   Family History:   family history includes Breast cancer in his mother; Heart attack in his mother; Lung cancer in his father.    Review of Systems: Review of Systems  Constitutional: Negative.   Respiratory:  Positive for shortness of breath.   Cardiovascular: Negative.   Gastrointestinal: Negative.   Musculoskeletal: Negative.   Neurological: Negative.   Psychiatric/Behavioral: Negative.    All other systems reviewed and are negative.  PHYSICAL EXAM: VS:  BP 118/72 (BP Location: Left Arm, Patient Position: Sitting, Cuff Size: Normal)   Pulse (!) 44   Ht 5\' 8"  (1.727 m)   Wt 200 lb 12.8 oz (91.1 kg)   SpO2 96%   BMI 30.53 kg/m  , BMI Body mass index is 30.53 kg/m. Constitutional:  oriented to person, place, and time. No distress.  HENT:  Head: Grossly normal Eyes:  no discharge. No scleral icterus.  Neck: No JVD, no carotid bruits  Cardiovascular: Regular rate and rhythm, no murmurs appreciated Pulmonary/Chest: Clear to auscultation bilaterally, no wheezes or rails Abdominal: Soft.  no distension.  no tenderness.  Musculoskeletal: Normal range of motion Neurological:  normal muscle tone. Coordination normal. No atrophy Skin: Skin warm and dry Psychiatric: normal affect, pleasant  Recent Labs: 09/24/2022: ALT 23; BUN 21; Creatinine, Ser 1.55; Potassium 5.0; Sodium 136    Lipid Panel No results found for: "CHOL", "HDL", "LDLCALC", "TRIG"    Wt Readings from Last 3  Encounters:  10/25/22 200 lb 12.8 oz (91.1 kg)  10/18/22 204 lb 6.4 oz (92.7 kg)  10/06/22 202 lb (91.6 kg)     ASSESSMENT AND PLAN:  Coronary artery disease with stable angina catheterization November 2019 showing no significant change in his disease RCA occluded with collaterals from left to right -Underwent stress test May 2022 with no ischemia Discussed potentially repeating his stress test Symptoms somewhat atypical, likely exacerbated by deconditioning Will try Lasix as below  Chronic shortness of breath Dating back before 2017 Catheterization 2017, repeat right and  left heart catheterization for similar symptoms 2019, normal right heart pressures noted -Very deconditioned at baseline, sedentary, reports activity limited by chronic leg pain Minimal leg edema, likely dependent edema, less likely CHF -With that said, recommend he try Lasix 20 mg 3 days a week -Would likely be unable to participate in lung works or cardiac rehab program  Essential hypertension -  Blood pressure is well controlled on today's visit. No changes made to the medications.  Mixed hyperlipidemia -  Cholesterol is at goal on the current lipid regimen. No changes to the medications were made.  Diabetes mellitus, type II, insulin dependent (HCC) - Plan: EKG 12-Lead Very sedentary, poor diet Followed by endocrine  Chronic renal impairment, stage 3 (moderate)  In the setting of diabetes Creatinine 1.5  Weakness Major issue, walks with a cane No longer doing PT ordered home exercises Significant concern for long-term debility   Total encounter time more than 40 minutes  Greater than 50% was spent in counseling and coordination of care with the patient    Orders Placed This Encounter  Procedures   EKG 12-Lead     Signed, Dossie Arbour, M.D., Ph.D. 10/25/2022  [facility] [place], [place] [phone]

## 2022-10-24 NOTE — Telephone Encounter (Signed)
Patient's daughter is calling to let Dr. Mariah Milling and nurse know that patient fell at church on Sunday 10/06 and then went to hospital following.  She states that patient pulmonologist is requesting patient to be seen by card.  She would like a call after patients been seen at appt with Dr. Mariah Milling 10/09.

## 2022-10-25 ENCOUNTER — Encounter: Payer: Self-pay | Admitting: Cardiovascular Disease

## 2022-10-25 ENCOUNTER — Ambulatory Visit: Payer: PPO | Attending: Cardiovascular Disease | Admitting: Cardiovascular Disease

## 2022-10-25 VITALS — BP 118/72 | HR 44 | Ht 68.0 in | Wt 200.8 lb

## 2022-10-25 DIAGNOSIS — N183 Chronic kidney disease, stage 3 unspecified: Secondary | ICD-10-CM

## 2022-10-25 DIAGNOSIS — E119 Type 2 diabetes mellitus without complications: Secondary | ICD-10-CM | POA: Diagnosis not present

## 2022-10-25 DIAGNOSIS — I25118 Atherosclerotic heart disease of native coronary artery with other forms of angina pectoris: Secondary | ICD-10-CM

## 2022-10-25 DIAGNOSIS — R0609 Other forms of dyspnea: Secondary | ICD-10-CM

## 2022-10-25 DIAGNOSIS — D638 Anemia in other chronic diseases classified elsewhere: Secondary | ICD-10-CM

## 2022-10-25 DIAGNOSIS — Z794 Long term (current) use of insulin: Secondary | ICD-10-CM

## 2022-10-25 DIAGNOSIS — I5032 Chronic diastolic (congestive) heart failure: Secondary | ICD-10-CM | POA: Diagnosis not present

## 2022-10-25 DIAGNOSIS — E785 Hyperlipidemia, unspecified: Secondary | ICD-10-CM

## 2022-10-25 DIAGNOSIS — R001 Bradycardia, unspecified: Secondary | ICD-10-CM | POA: Diagnosis not present

## 2022-10-25 DIAGNOSIS — I951 Orthostatic hypotension: Secondary | ICD-10-CM

## 2022-10-25 MED ORDER — FUROSEMIDE 20 MG PO TABS: 20.0000 mg | ORAL_TABLET | ORAL | Status: DC

## 2022-10-25 NOTE — Patient Instructions (Addendum)
Medication Instructions:  Please take lasix 20 mg three times a week   If you need a refill on your cardiac medications before your next appointment, please call your pharmacy.   Lab work: No new labs needed  Testing/Procedures: No new testing needed  Follow-Up: At [facility], you and your health needs are our priority.  As part of our continuing mission to provide you with exceptional heart care, we have created designated Provider Care Teams.  These Care Teams include your primary Cardiologist (physician) and Advanced Practice Providers (APPs -  Physician Assistants and Nurse Practitioners) who all work together to provide you with the care you need, when you need it.  You will need a follow up appointment in 3 months  Providers on your designated Care Team:   Nicolasa Ducking, NP Eula Listen, PA-C Cadence Fransico Michael, [place]  COVID-19 Vaccine Information can be found at: [email or web address] For questions related to vaccine distribution or appointments, please email [email or web address] or call [phone].

## 2022-10-26 NOTE — Telephone Encounter (Signed)
Patient and wife verbalize understanding of instructions and state that they do not need their daughter called. Patient and wife informed if they or their daughter have any questions to call the office.

## 2022-10-30 DIAGNOSIS — G4733 Obstructive sleep apnea (adult) (pediatric): Secondary | ICD-10-CM | POA: Diagnosis not present

## 2022-10-30 DIAGNOSIS — I6523 Occlusion and stenosis of bilateral carotid arteries: Secondary | ICD-10-CM | POA: Diagnosis not present

## 2022-10-30 DIAGNOSIS — R42 Dizziness and giddiness: Secondary | ICD-10-CM | POA: Diagnosis not present

## 2022-11-07 ENCOUNTER — Encounter: Payer: Self-pay | Admitting: Neurology

## 2022-11-07 ENCOUNTER — Ambulatory Visit: Payer: PPO | Admitting: Neurology

## 2022-11-07 VITALS — BP 134/68 | HR 56 | Ht 67.0 in | Wt 202.6 lb

## 2022-11-07 DIAGNOSIS — F01518 Vascular dementia, unspecified severity, with other behavioral disturbance: Secondary | ICD-10-CM

## 2022-11-07 DIAGNOSIS — F02818 Dementia in other diseases classified elsewhere, unspecified severity, with other behavioral disturbance: Secondary | ICD-10-CM

## 2022-11-07 DIAGNOSIS — G309 Alzheimer's disease, unspecified: Secondary | ICD-10-CM

## 2022-11-07 DIAGNOSIS — F411 Generalized anxiety disorder: Secondary | ICD-10-CM | POA: Diagnosis not present

## 2022-11-07 MED ORDER — ESCITALOPRAM OXALATE 20 MG PO TABS: ORAL_TABLET | ORAL | 3 refills | Status: DC

## 2022-11-07 MED ORDER — DONEPEZIL HCL 10 MG PO TABS: 10.0000 mg | ORAL_TABLET | Freq: Every day | ORAL | 3 refills | Status: DC

## 2022-11-07 NOTE — Progress Notes (Signed)
NEUROLOGY FOLLOW UP OFFICE NOTE  Ronald Wallace [id number] Dec 11, 1940  HISTORY OF PRESENT ILLNESS: I had the pleasure of seeing Ronald Wallace in follow-up in the neurology clinic on 11/07/2022.  The patient was last seen 6 months ago for dementia. He is again accompanied by his wife and daughter Ronald Wallace who help supplement the history today.  Records and images were personally reviewed where available.  MMSE 23/30 in 04/2022. He is on Donepezil 10mg  daily and Lexapro 30mg  daily. Since his last visit, symptoms overall stable except for an increase in anxiety. His wife manages medications, finances, meals. Concern on last visit was that her primary caregiving was taking a toll on her, however they refused to move for increased level of care. Their son in [place] visits more frequently, they have decided that as long as they stay in the house, they know that help may be needed. Sometimes his wife leaves him alone, they have alarms set up connected to their phones. They note he gets irritated easily. He always wants to know where his wife is, Ronald Wallace notes this is worse. He gets tired easily and has seen his cardiologist. He gets dizzy a lot, he leaned down at church to pick up the bulletin on the floor, the ushers caught him before he hit the floor. Appetite is not as good, he is not allowed to eat processed meats but has adjusted pretty well, trying to eat mor vegetables.    History on Initial Assessment 06/18/2019: This is a 83 year old right-handed man with a history of hypertension, hyperlipidemia, diabetes, CAD, CKD, bradycardia, anxiety, depression, presenting for evaluation of memory loss. His daughter,as well as wife on speakerphone, are present to provide additional information. Records were reviewed. He was seen 6 days ago at [facility] by neurologist Dr. Sherryll Wallace, however they would still like to proceed with visit today for the same concern. SLUMS score was 18/30.  Bloodwork was ordered, he  is scheduled for an MRI brain with NeuroQuant sequence. His family feels that memory changes started only a month ago. He became tearful stating that "I cannot remember things," making him frustrated. He states "some things I can remember, but others I do not." He would forget a conversation from 5 minutes prior. He used to remember announcer names well. He lives with his wife who has to double check behind him on his insulin, telling him what dosage to take. He writes down bills and his wife pays them. He got lost driving twice in the past month. He got lost driving to their granddaughter's graduation, he apparently got very upset/agitated. He does not remember this. He does recall getting lost driving to get gas. He states he "just about quit driving." Family now has a GPS tracker, his wife is always in the car with him now. He is independent with dressing and bathing.  His daughter reports that he used to exercise more, then with the pandemic he got depressed. Family noticed that he did not want to be around his grandchildren anymore, getting easily agitated. Some people say something and it "sends him off to another pedestal." He can be so sweet sometimes then curl up and not want to talk to anybody. He has always had issues with his siblings, "always been a part of him," this seems to be getting better. They do feel that since memory changes started, he is doing better with other people. Paxil was recently changed to Lexapro, which he has been taking for only  2 days. No paranoia or hallucinations. He was started on Donepezil 5mg  daily 3 weeks ago which he is overall tolerating. No family history of dementia. He does not drink alcohol. He denies any significant head injuries. He had a syncopal episode in 2018 where he woke up on the ground at work. I personally reviewed MRI brain with and without contrast done 11/2016 which did not show any acute changes. There was mild diffuse atrophy and mild to moderate  chronic microvascular disease.   He used to have frequent headaches when he was working and they quieted down, but recently come back "like they used to be." There is no associated nausea/vomiting, photo/phonophobia. Family thinks it is more stress-related. He denies any dizziness, diplopia, dysarthria/dysphagia, bowel/bladder dysfunction, anosmia. He has chronic neck and back pain. He has some tingling and numbness in his hands and feet. He has occasional hand tremors. Sleep is pretty good. They deny any REM behavior disorder, but he has fallen out of bed while sleeping. This was initially happening every 2-3 weeks, but has quieted down, last occurred 3 weeks ago. Family reports he used to exercise more but stopped when the pandemic hit.  Laboratory Data: MRI brain without contrast done 06/2019 did not show any acute changes. There was moderate chronic microvascular disease, diffuse volume loss with whole brain volume at 2nd percentile.  Neuropsychological testing done in 07/2019 noted that testing must be interpreted cautiously as he was tearful and visibly upset throughout much of the evaluation. He did not show clear memory storage problems and instead had notable encoding issues and relatively preserved retention of information across time on several tasks. He screened positive for depression. Diagnosis was Mild Cognitive Impairment, etiology unclear, possibly mixed AD and vascular, with interference from depression and anxiety.   Repeat Neuropsychological evaluation in 07/2020 indicated transition to Major Neurocognitive Disorder (dementia) compared to prior evaluation. Although there was a good degree of stability compared to prior testing, there was diffuse cognitive impairment with most prominent deficits across processing speed, attention/concentration, executive functioning, receptive language, and semantic fluency. It was noted his affect was much improved compared to prior, which may account for  mild improvements in some domains. 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  PAST MEDICAL HISTORY: Past Medical History:  Diagnosis Date   Anemia of chronic disease 07/01/2015   Arthritis    Benign prostatic hyperplasia with nocturia 10/30/2016   Bradycardia 03/12/2014   Bruit of left carotid artery 03/12/2014   CKD (chronic kidney disease) 09/27/2015   stage 3, GFR 30-59 ml/min   Coronary artery disease involving native coronary artery of native heart without angina pectoris    a. 02/2014 Neg Ex MV, EF 65%;  b. 06/2015 Abnl MV ([facility])-->Cath ([facility]): LM nl, LAD 30p/m, LCX 30p, OM2 50p, RCA 100p w/ L-->R collats, nl EF-->Med Rx; c. 10/2017 MV: No isch/scar; d. 11/2017 Cath: LM nl, LAD 45p/m, LCX 30ost, OM2 30, RCA 100 CTO, L->R collats. PA 31/16/22, PCWP 11; e. 05/2020 MV: EF 67%, no isch/scar->low risk.   Diabetes mellitus, type II, insulin dependent    Diabetic neuropathy    Diabetic retinopathy associated with type 2 diabetes mellitus 10/28/2018   Disorder of bone and cartilage    Diverticulitis    DOE (dyspnea on exertion) 03/12/2014   Generalized anxiety disorder    GERD (gastroesophageal reflux disease)    Hypertension associated with diabetes    Major depressive disorder    Major neurocognitive disorder due to multiple etiologies 08/04/2020  likely Alzheimer's disease w/vascular contributions   Mixed hyperlipidemia due to type 2 diabetes mellitus 02/06/2017   Obesity    Osteoporosis    Pure hypercholesterolemia    Syncope and collapse    a. 02/2014 Carotid Dopplers: 1-39% bilateral ICA with normal Subclavian & vertebral arteries; b. 02/2014 2 D Echo: EF 50-55%, Gr 1 DD, Mild MR & LA dilation -- was bradycardic in 40s; c. 04/2014 Event monitor: RSR/SA/SB. Occas PVC's. No AFib.    MEDICATIONS: Current Outpatient Medications on File Prior to Visit  Medication Sig Dispense Refill   acetaminophen (TYLENOL) 325 MG tablet Take 650 mg by mouth every 6 (six) hours as  needed.     aspirin EC 81 MG tablet Take 81 mg by mouth daily.     calcium carbonate (OS-CAL) 600 MG TABS tablet Take 600 mg by mouth daily with breakfast.     cholecalciferol (VITAMIN D) 1000 UNITS tablet Take 2,000 Units by mouth daily.      Continuous Blood Gluc Sensor (FREESTYLE LIBRE 2 SENSOR) MISC USE AS DIRECTED - REPLACE EVERY 14 DAYS     cyanocobalamin (VITAMIN B12) 1000 MCG tablet Take 1,000 mcg by mouth daily.     donepezil (ARICEPT) 10 MG tablet Take 1 tablet (10 mg total) by mouth daily. 90 tablet 3   escitalopram (LEXAPRO) 20 MG tablet Take 1 and 1/2 tablets daily 135 tablet 3   ferrous sulfate 325 (65 FE) MG tablet Take 325 mg by mouth daily with breakfast.     furosemide (LASIX) 20 MG tablet Take 1 tablet (20 mg total) by mouth 3 (three) times a week.     gabapentin (NEURONTIN) 300 MG capsule Take 300-600 mg by mouth See admin instructions. Take 1 capsule (300 mg) by mouth in the morning & 2 capsules (600 mg) by mouth every night.     hydrALAZINE (APRESOLINE) 25 MG tablet Take 1 tablet by mouth 3 (three) times daily.     insulin detemir (LEVEMIR) 100 UNIT/ML injection Inject 50 Units into the skin daily.     isosorbide mononitrate (IMDUR) 30 MG 24 hr tablet Take 1 tablet by mouth once daily 90 tablet 3   levothyroxine (SYNTHROID) 50 MCG tablet Take 50 mcg by mouth daily before breakfast.     metFORMIN (GLUCOPHAGE) 500 MG tablet Take 250 mg by mouth 2 (two) times daily with a meal.     NOVOLOG 100 UNIT/ML injection Inject 20-38 Units into the skin 3 (three) times daily with meals. Sliding scale insulin base rates= 20 units in the morning, 26 units with lunch, 26 units with supper.     simvastatin (ZOCOR) 20 MG tablet Take 20 mg by mouth daily.     tamsulosin (FLOMAX) 0.4 MG CAPS capsule Take 0.4 mg by mouth daily. Takes 2 each morning.     traMADol (ULTRAM) 50 MG tablet Take 25-50 mg by mouth 2 (two) times daily as needed.     TRESIBA FLEXTOUCH 100 UNIT/ML FlexTouch Pen SMARTSIG:25  Unit(s) SUB-Q Daily     Vibegron (GEMTESA) 75 MG TABS Take 1 tablet (75 mg total) by mouth daily. 30 tablet 11   No current facility-administered medications on file prior to visit.    ALLERGIES: Allergies  Allergen Reactions   Amlodipine     Other reaction(s): Other (See Comments) Peripheral edema    FAMILY HISTORY: Family History  Problem Relation Age of Onset   Heart attack Mother    Breast cancer Mother    Lung cancer Father  SOCIAL HISTORY: Social History   Socioeconomic History   Marital status: Married    Spouse name: Not on file   Number of children: Not on file   Years of education: 14   Highest education level: Associate degree: academic program  Occupational History   Occupation: Retired    Comment: Engineer, maintenance  Tobacco Use   Smoking status: Former    Types: Pipe    Quit date: 1965    Years since quitting: 59.8    Passive exposure: Never   Smokeless tobacco: Never  Vaping Use   Vaping status: Never Used  Substance and Sexual Activity   Alcohol use: No   Drug use: No   Sexual activity: Not on file  Other Topics Concern   Not on file  Social History Narrative   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   Married - at least 1 daughter.   Never Smoked   Usually very active, but no exercise routine.   Right handed    Social Determinants of Health   Financial Resource Strain: Low Risk  (09/13/2022)   Received from [facility]   Overall Financial Resource Strain (CARDIA)    Difficulty of Paying Living Expenses: Not hard at all  Food Insecurity: No Food Insecurity (09/13/2022)   Received from [facility]   Hunger Vital Sign    Worried About Running Out of Food in the Last Year: Never true    Ran Out of Food in the Last Year: Never true  Transportation Needs: No Transportation Needs (09/13/2022)   Received from [facility] - [facility]    In the past 12 months, has lack of  transportation kept you from medical appointments or from getting medications?: No    Lack of Transportation (Non-Medical): No  Physical Activity: Not on file  Stress: Not on file  Social Connections: Not on file  Intimate Partner Violence: Not on file     PHYSICAL EXAM: Vitals:   11/07/22 1417  BP: 134/68  Pulse: (!) 56  SpO2: 94%   General: No acute distress Head:  Normocephalic/atraumatic Skin/Extremities: No rash, no edema Neurological Exam: alert and awake. No aphasia or dysarthria. Fund of knowledge is appropriate.  Attention and concentration are normal.   Cranial nerves: Pupils equal, round. Extraocular movements intact with no nystagmus. Visual fields full.  No facial asymmetry.  Motor: Bulk and tone normal, muscle strength 5/5 throughout with no pronator drift.   Finger to nose testing intact.  Gait slow and cautious with cane, no ataxia.    IMPRESSION: This is an 82 yo RH man with a history of hypertension, hyperlipidemia, diabetes, CAD, CKD, bradycardia, anxiety, depression, with Neuropsychological evaluation indicating Major Neurocognitive Disorder (dementia), likely mixed vascular and AD. MMSE 23/30 in 04/2022. Continue Donepezil 10mg  daily. He is having more anxiety, mostly related to his wife's whereabouts. We discussed the option of starting Buspirone 5mg  BID, side effects discussed. They would like to hold off first and complete his cardiology workup before starting any new medications. Continue close supervision. We had a long discussion on last visit about help at home, with his wife as primary caregiver, and have agreed to closely monitor situation. He does not drive. Follow-up with [facility] PA Marlowe Kays in 6 months, call for any changes.   Thank you for allowing me to participate in his care.  Please do not hesitate to call for any questions or concerns.  Patrcia Dolly, M.D.   CC: Dr. Quentin Ore

## 2022-11-07 NOTE — Patient Instructions (Signed)
Good to see you.  Continue all your medications  2. Continue close supervision  3. Follow-up with Memory Disorders PA Marlowe Kays in 6 months, I will see you in a year   FALL PRECAUTIONS: Be cautious when walking. Scan the area for obstacles that may increase the risk of trips and falls. When getting up in the mornings, sit up at the edge of the bed for a few minutes before getting out of bed. Consider elevating the bed at the head end to avoid drop of blood pressure when getting up. Walk always in a well-lit room (use night lights in the walls). Avoid area rugs or power cords from appliances in the middle of the walkways. Use a walker or a cane if necessary and consider physical therapy for balance exercise. Get your eyesight checked regularly.  HOME SAFETY: Consider the safety of the kitchen when operating appliances like stoves, microwave oven, and blender. Consider having supervision and share cooking responsibilities until no longer able to participate in those. Accidents with firearms and other hazards in the house should be identified and addressed as well.  ABILITY TO BE LEFT ALONE: If patient is unable to contact 911 operator, consider using LifeLine, or when the need is there, arrange for someone to stay with patients. Smoking is a fire hazard, consider supervision or cessation. Risk of wandering should be assessed by caregiver and if detected at any point, supervision and safe proof recommendations should be instituted.  RECOMMENDATIONS FOR ALL PATIENTS WITH MEMORY PROBLEMS: 1. Continue to exercise (Recommend 30 minutes of walking everyday, or 3 hours every week) 2. Increase social interactions - continue going to [place] and enjoy social gatherings with friends and family 3. Eat healthy, avoid fried foods and eat more fruits and vegetables 4. Maintain adequate blood pressure, blood sugar, and blood cholesterol level. Reducing the risk of stroke and cardiovascular disease also helps  promoting better memory. 5. Avoid stressful situations. Live a simple life and avoid aggravations. Organize your time and prepare for the next day in anticipation. 6. Sleep well, avoid any interruptions of sleep and avoid any distractions in the bedroom that may interfere with adequate sleep quality 7. Avoid sugar, avoid sweets as there is a strong link between excessive sugar intake, diabetes, and cognitive impairment The Mediterranean diet has been shown to help patients reduce the risk of progressive memory disorders and reduces cardiovascular risk. This includes eating fish, eat fruits and green leafy vegetables, nuts like almonds and hazelnuts, walnuts, and also use olive oil. Avoid fast foods and fried foods as much as possible. Avoid sweets and sugar as sugar use has been linked to worsening of memory function.  There is always a concern of gradual progression of memory problems. If this is the case, then we may need to adjust level of care according to patient needs. Support, both to the patient and caregiver, should then be put into place.

## 2022-11-14 ENCOUNTER — Telehealth: Payer: Self-pay

## 2022-11-14 DIAGNOSIS — R350 Frequency of micturition: Secondary | ICD-10-CM

## 2022-11-14 MED ORDER — MIRABEGRON ER 50 MG PO TB24
50.0000 mg | ORAL_TABLET | Freq: Every day | ORAL | 11 refills | Status: DC
Start: 2022-11-14 — End: 2023-11-28

## 2022-11-14 NOTE — Telephone Encounter (Signed)
Pts wife can no longer can afford Gemtesa. Can he go back on mybetriq?    LV  9/24  [facility] [address] [place].   Pls advise.

## 2022-11-14 NOTE — Telephone Encounter (Signed)
Yes, prescription sent to [facility].

## 2022-11-15 NOTE — Telephone Encounter (Signed)
Pt's wife informed, voiced understanding.

## 2022-11-17 DIAGNOSIS — E1142 Type 2 diabetes mellitus with diabetic polyneuropathy: Secondary | ICD-10-CM | POA: Diagnosis not present

## 2022-11-17 DIAGNOSIS — R0602 Shortness of breath: Secondary | ICD-10-CM | POA: Diagnosis not present

## 2022-11-17 DIAGNOSIS — G4733 Obstructive sleep apnea (adult) (pediatric): Secondary | ICD-10-CM | POA: Diagnosis not present

## 2022-11-17 DIAGNOSIS — Z794 Long term (current) use of insulin: Secondary | ICD-10-CM | POA: Diagnosis not present

## 2022-11-17 DIAGNOSIS — N1831 Chronic kidney disease, stage 3a: Secondary | ICD-10-CM | POA: Diagnosis not present

## 2022-11-21 ENCOUNTER — Encounter: Payer: Self-pay | Admitting: Emergency Medicine

## 2022-11-22 DIAGNOSIS — B351 Tinea unguium: Secondary | ICD-10-CM | POA: Diagnosis not present

## 2022-11-22 DIAGNOSIS — L600 Ingrowing nail: Secondary | ICD-10-CM | POA: Diagnosis not present

## 2022-11-22 DIAGNOSIS — E1140 Type 2 diabetes mellitus with diabetic neuropathy, unspecified: Secondary | ICD-10-CM | POA: Diagnosis not present

## 2022-11-29 DIAGNOSIS — G4733 Obstructive sleep apnea (adult) (pediatric): Secondary | ICD-10-CM | POA: Diagnosis not present

## 2022-11-30 DIAGNOSIS — G4733 Obstructive sleep apnea (adult) (pediatric): Secondary | ICD-10-CM | POA: Diagnosis not present

## 2022-12-18 DIAGNOSIS — E1169 Type 2 diabetes mellitus with other specified complication: Secondary | ICD-10-CM | POA: Diagnosis not present

## 2022-12-18 DIAGNOSIS — D638 Anemia in other chronic diseases classified elsewhere: Secondary | ICD-10-CM | POA: Diagnosis not present

## 2022-12-18 DIAGNOSIS — E785 Hyperlipidemia, unspecified: Secondary | ICD-10-CM | POA: Diagnosis not present

## 2022-12-19 DIAGNOSIS — E66812 Obesity, class 2: Secondary | ICD-10-CM | POA: Diagnosis not present

## 2022-12-19 DIAGNOSIS — D696 Thrombocytopenia, unspecified: Secondary | ICD-10-CM | POA: Diagnosis not present

## 2022-12-19 DIAGNOSIS — E1169 Type 2 diabetes mellitus with other specified complication: Secondary | ICD-10-CM | POA: Diagnosis not present

## 2022-12-19 DIAGNOSIS — E1142 Type 2 diabetes mellitus with diabetic polyneuropathy: Secondary | ICD-10-CM | POA: Diagnosis not present

## 2022-12-19 DIAGNOSIS — Z0000 Encounter for general adult medical examination without abnormal findings: Secondary | ICD-10-CM | POA: Diagnosis not present

## 2022-12-19 DIAGNOSIS — Z6835 Body mass index (BMI) 35.0-35.9, adult: Secondary | ICD-10-CM | POA: Diagnosis not present

## 2022-12-19 DIAGNOSIS — D638 Anemia in other chronic diseases classified elsewhere: Secondary | ICD-10-CM | POA: Diagnosis not present

## 2022-12-19 DIAGNOSIS — Z794 Long term (current) use of insulin: Secondary | ICD-10-CM | POA: Diagnosis not present

## 2022-12-19 DIAGNOSIS — E785 Hyperlipidemia, unspecified: Secondary | ICD-10-CM | POA: Diagnosis not present

## 2022-12-19 DIAGNOSIS — N1831 Chronic kidney disease, stage 3a: Secondary | ICD-10-CM | POA: Diagnosis not present

## 2022-12-19 DIAGNOSIS — I2510 Atherosclerotic heart disease of native coronary artery without angina pectoris: Secondary | ICD-10-CM | POA: Diagnosis not present

## 2022-12-30 DIAGNOSIS — G4733 Obstructive sleep apnea (adult) (pediatric): Secondary | ICD-10-CM | POA: Diagnosis not present

## 2023-01-29 ENCOUNTER — Ambulatory Visit: Payer: PPO | Admitting: Cardiovascular Disease

## 2023-01-30 DIAGNOSIS — G4733 Obstructive sleep apnea (adult) (pediatric): Secondary | ICD-10-CM | POA: Diagnosis not present

## 2023-02-05 ENCOUNTER — Telehealth: Payer: Self-pay | Admitting: Cardiovascular Disease

## 2023-02-05 NOTE — Telephone Encounter (Signed)
Pt c/o swelling/edema: STAT if pt has developed SOB within 24 hours  If swelling, where is the swelling located? Both legs  How much weight have you gained and in what time span? N/A  Have you gained 2 pounds in a day or 5 pounds in a week? Yes   Do you have a log of your daily weights (if so, list)? No   Are you currently taking a fluid pill? No   Are you currently SOB? No   Have you traveled recently in a car or plane for an extended period of time? No

## 2023-02-05 NOTE — Telephone Encounter (Signed)
Called and spoke with son per DPR. Patient with bilateral leg swelling times 2-3 days. Patient with left knee pain and more swelling to left leg. Patient has an appointment with orthopedist on 02/07/23. Denies weight gain of 2 lbs in a day or 5 lbs in a week. Denies any shortness of breath. Patient prescribed Furosemide 20 MG three days a week but has not been taking it. Encouraged to start taking Furosemide as prescribed. Offered appointment to son. Son states that they will start his Furosemide back and see the Orthopedist. Son states that if the swelling does not improve or if symptoms become worse they will call back for an appointment.

## 2023-02-06 DIAGNOSIS — I152 Hypertension secondary to endocrine disorders: Secondary | ICD-10-CM | POA: Diagnosis not present

## 2023-02-06 DIAGNOSIS — E66812 Obesity, class 2: Secondary | ICD-10-CM | POA: Diagnosis not present

## 2023-02-06 DIAGNOSIS — E1169 Type 2 diabetes mellitus with other specified complication: Secondary | ICD-10-CM | POA: Diagnosis not present

## 2023-02-06 DIAGNOSIS — E1142 Type 2 diabetes mellitus with diabetic polyneuropathy: Secondary | ICD-10-CM | POA: Diagnosis not present

## 2023-02-06 DIAGNOSIS — E039 Hypothyroidism, unspecified: Secondary | ICD-10-CM | POA: Diagnosis not present

## 2023-02-06 DIAGNOSIS — E1159 Type 2 diabetes mellitus with other circulatory complications: Secondary | ICD-10-CM | POA: Diagnosis not present

## 2023-02-06 DIAGNOSIS — Z794 Long term (current) use of insulin: Secondary | ICD-10-CM | POA: Diagnosis not present

## 2023-02-06 DIAGNOSIS — E785 Hyperlipidemia, unspecified: Secondary | ICD-10-CM | POA: Diagnosis not present

## 2023-02-06 DIAGNOSIS — Z6835 Body mass index (BMI) 35.0-35.9, adult: Secondary | ICD-10-CM | POA: Diagnosis not present

## 2023-02-07 DIAGNOSIS — G8929 Other chronic pain: Secondary | ICD-10-CM | POA: Diagnosis not present

## 2023-02-07 DIAGNOSIS — M25562 Pain in left knee: Secondary | ICD-10-CM | POA: Diagnosis not present

## 2023-02-07 DIAGNOSIS — M1712 Unilateral primary osteoarthritis, left knee: Secondary | ICD-10-CM | POA: Diagnosis not present
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
## 2023-02-08 ENCOUNTER — Encounter: Payer: Self-pay | Admitting: Physician Assistant

## 2023-02-08 ENCOUNTER — Ambulatory Visit: Payer: PPO | Attending: Physician Assistant | Admitting: Physician Assistant

## 2023-02-08 VITALS — BP 120/63 | HR 53 | Ht 68.0 in | Wt 203.8 lb

## 2023-02-08 DIAGNOSIS — I5032 Chronic diastolic (congestive) heart failure: Secondary | ICD-10-CM | POA: Diagnosis not present

## 2023-02-08 DIAGNOSIS — R0609 Other forms of dyspnea: Secondary | ICD-10-CM | POA: Diagnosis not present

## 2023-02-08 DIAGNOSIS — I2510 Atherosclerotic heart disease of native coronary artery without angina pectoris: Secondary | ICD-10-CM

## 2023-02-08 DIAGNOSIS — E785 Hyperlipidemia, unspecified: Secondary | ICD-10-CM

## 2023-02-08 DIAGNOSIS — M7989 Other specified soft tissue disorders: Secondary | ICD-10-CM | POA: Diagnosis not present

## 2023-02-08 DIAGNOSIS — Z79899 Other long term (current) drug therapy: Secondary | ICD-10-CM | POA: Diagnosis not present

## 2023-02-08 DIAGNOSIS — N183 Chronic kidney disease, stage 3 unspecified: Secondary | ICD-10-CM | POA: Diagnosis not present

## 2023-02-08 DIAGNOSIS — R001 Bradycardia, unspecified: Secondary | ICD-10-CM

## 2023-02-08 DIAGNOSIS — I10 Essential (primary) hypertension: Secondary | ICD-10-CM

## 2023-02-08 DIAGNOSIS — I5030 Unspecified diastolic (congestive) heart failure: Secondary | ICD-10-CM | POA: Diagnosis not present

## 2023-02-08 NOTE — Patient Instructions (Addendum)
Medication Instructions:  Your Physician recommend you continue on your current medication as directed.    *If you need a refill on your cardiac medications before your next appointment, please call your pharmacy*   Lab Work: Your provider would like for you to have following labs drawn today CMP and BNP.   If you have labs (blood work) drawn today and your tests are completely normal, you will receive your results only by: MyChart Message (if you have MyChart) OR A paper copy in the mail If you have any lab test that is abnormal or we need to change your treatment, we will call you to review the results.   Testing/Procedures: Your physician has requested that you have an echocardiogram. Echocardiography is a painless test that uses sound waves to create images of your heart. It provides your doctor with information about the size and shape of your heart and how well your heart's chambers and valves are working.   You may receive an ultrasound enhancing agent through an IV if needed to better visualize your heart during the echo. This procedure takes approximately one hour.  There are no restrictions for this procedure.  This will take place at [address] [facility]) [address], [place] [address]  Please note: We ask at that you not bring children with you during ultrasound (echo/ vascular) testing. Due to room size and safety concerns, children are not allowed in the ultrasound rooms during exams. Our front office staff cannot provide observation of children in our lobby area while testing is being conducted. An adult accompanying a patient to their appointment will only be allowed in the ultrasound room at the discretion of the ultrasound technician under special circumstances. We apologize for any inconvenience.  Follow-Up: At [facility], you and your health needs are our priority.  As part of our continuing mission to provide you with exceptional heart care, we  have created designated Provider Care Teams.  These Care Teams include your primary Cardiologist (physician) and Advanced Practice Providers (APPs -  Physician Assistants and Nurse Practitioners) who all work together to provide you with the care you need, when you need it.  We recommend signing up for the patient portal called "MyChart".  Sign up information is provided on this After Visit Summary.  MyChart is used to connect with patients for Virtual Visits (Telemedicine).  Patients are able to view lab/test results, encounter notes, upcoming appointments, etc.  Non-urgent messages can be sent to your provider as well.   To learn more about what you can do with MyChart, go to [email or web address].    Your next appointment:   2-3 months  Provider:   You may see Julien Nordmann, MD

## 2023-02-09 LAB — COMPREHENSIVE METABOLIC PANEL
ALT: 20 [IU]/L (ref 0–44)
AST: 14 [IU]/L (ref 0–40)
Albumin: 4.1 g/dL (ref 3.7–4.7)
Alkaline Phosphatase: 76 [IU]/L (ref 44–121)
BUN/Creatinine Ratio: 21 (ref 10–24)
BUN: 34 mg/dL — ABNORMAL HIGH (ref 8–27)
Bilirubin Total: 0.3 mg/dL (ref 0.0–1.2)
CO2: 23 mmol/L (ref 20–29)
Calcium: 9.3 mg/dL (ref 8.6–10.2)
Chloride: 99 mmol/L (ref 96–106)
Creatinine, Ser: 1.62 mg/dL — ABNORMAL HIGH (ref 0.76–1.27)
Globulin, Total: 2.5 g/dL (ref 1.5–4.5)
Glucose: 437 mg/dL — ABNORMAL HIGH (ref 70–99)
Potassium: 5.1 mmol/L (ref 3.5–5.2)
Sodium: 137 mmol/L (ref 134–144)
Total Protein: 6.6 g/dL (ref 6.0–8.5)
eGFR: 42 mL/min/{1.73_m2} — ABNORMAL LOW (ref >59)

## 2023-02-09 LAB — BRAIN NATRIURETIC PEPTIDE: BNP: 126.1 pg/mL — ABNORMAL HIGH (ref 0.0–100.0)

## 2023-02-12 ENCOUNTER — Other Ambulatory Visit: Payer: Self-pay | Admitting: Emergency Medicine

## 2023-02-12 DIAGNOSIS — Z79899 Other long term (current) drug therapy: Secondary | ICD-10-CM

## 2023-02-12 MED ORDER — FUROSEMIDE 20 MG PO TABS: 20.0000 mg | ORAL_TABLET | ORAL | 3 refills | Status: AC

## 2023-02-21 ENCOUNTER — Other Ambulatory Visit: Payer: Self-pay | Admitting: Emergency Medicine

## 2023-02-21 DIAGNOSIS — Z79899 Other long term (current) drug therapy: Secondary | ICD-10-CM

## 2023-02-26 ENCOUNTER — Ambulatory Visit: Payer: PPO | Admitting: Physician Assistant

## 2023-02-28 ENCOUNTER — Ambulatory Visit: Payer: PPO | Attending: Physician Assistant

## 2023-02-28 DIAGNOSIS — R0609 Other forms of dyspnea: Secondary | ICD-10-CM | POA: Diagnosis not present

## 2023-02-28 DIAGNOSIS — Z79899 Other long term (current) drug therapy: Secondary | ICD-10-CM | POA: Diagnosis not present

## 2023-02-28 DIAGNOSIS — M7989 Other specified soft tissue disorders: Secondary | ICD-10-CM | POA: Diagnosis not present

## 2023-02-28 LAB — ECHOCARDIOGRAM COMPLETE
AV Mean grad: 3 mm[Hg]
AV Peak grad: 6.5 mm[Hg]
Ao pk vel: 1.27 m/s
Area-P 1/2: 3.08 cm2

## 2023-03-01 LAB — BASIC METABOLIC PANEL
BUN/Creatinine Ratio: 16 (ref 10–24)
BUN: 25 mg/dL (ref 8–27)
CO2: 24 mmol/L (ref 20–29)
Calcium: 9.6 mg/dL (ref 8.6–10.2)
Chloride: 102 mmol/L (ref 96–106)
Creatinine, Ser: 1.57 mg/dL — ABNORMAL HIGH (ref 0.76–1.27)
Glucose: 201 mg/dL — ABNORMAL HIGH (ref 70–99)
Potassium: 4.5 mmol/L (ref 3.5–5.2)
Sodium: 143 mmol/L (ref 134–144)
eGFR: 44 mL/min/{1.73_m2} — ABNORMAL LOW (ref >59)

## 2023-03-02 DIAGNOSIS — G4733 Obstructive sleep apnea (adult) (pediatric): Secondary | ICD-10-CM | POA: Diagnosis not present

## 2023-03-23 ENCOUNTER — Ambulatory Visit: Payer: PPO | Admitting: Cardiovascular Disease

## 2023-03-26 NOTE — Progress Notes (Unsigned)
 This cardiology Office Note  Date:  03/27/2023   ID:  Ronald Wallace, DOB August 31, 1940, MRN [id number]  PCP:  Marisue Ivan, MD   Chief Complaint  Patient presents with   3 month follow up     Discuss Echo results. Patient c/o shortness of breath with little to no exertion & chest pain at times.     HPI:  Ronald Wallace is a 83 y/o male with a prior h/o  syncope,  HTN,  HL,   DM, remote smoking, pipe, , no cigarettes increasing dyspnea and underwent stress testing @ [facility], which was abnl,   CAD cath in late June 2017 revealing moderate LAD and LCX/OM dzs with a total occlusion of the prox RCA,  L  R collaterals.   LV fxn was nl,  Right and left heart catheterization 2019 for shortness of breath Chronic shortness of breath who presents for routine follow-up of his coronary artery disease,  chronic shortness of breath  Last seen in clinic by myself October 2024 Seen by one of our providers January 2025  Family presents with him on today's visit Echocardiogram February 2025, results discussed in detail EF 55 to 60%, normal RV size and function, no significant valvular heart disease  Myoview May 2022 Low risk study  Lab work reviewed Pressure at home 149/60 before medications, typically will run 130 systolic after medication Takes 2 Flomax in the morning, all of his medications in the morning isosorbide, lisinopril  Tries to stay active, does walking in the house, down to the mailbox but no regular exercise program  Denies significant chest pain Trace leg swelling, stable, takes Lasix 3 days a week  EKG personally reviewed by myself on todays visit EKG Interpretation Date/Time:  Tuesday March 27 2023 11:32:16 EDT Ventricular Rate:  53 PR Interval:  198 QRS Duration:  82 QT Interval:  428 QTC Calculation: 401 R Axis:   10  Text Interpretation: Sinus bradycardia When compared with ECG of 08-Feb-2023 15:13, No significant change was found Confirmed by  Julien Nordmann [phone]) on 03/27/2023 11:50:58 AM    Echocardiogram January 2024 normal left and right ventricular size and function Normal right heart pressures  Cardiac imaging reviewed Echocardiogram January 2024  Normal left and right ventricular size and function No significant valvular heart disease, normal pressures measured  Event monitor Normal rhythm with short runs of tachycardia, these were not triggered for symptoms Patient triggered symptoms associated with normal rhythm  Myoview 05/2020 Normal pharmacologic myocardial perfusion stress test without significant ischemia or scar. This is a low risk study.  cardiac catheterization November 2019 for shortness of breath  right and left heart cath   Right heart pressures were within normal range RA: mean 11, RV 39/10/15, PA: 31/16/22, Wedge: 11  Mild to moderate mid LAD disease, mild proximal and mid LCX disease, Occluded proximal RCA with left to right collaterals Normal right heart pressures Prox LAD to Mid LAD lesion is 45% stenosed. 2nd Mrg lesion is 30% stenosed. Ost Cx to Prox Cx lesion is 30% stenosed. Prox RCA to Dist RCA lesion is 100% stenosed.    other past medical history reviewed  catheterization  June 2017 discussed with him, 30% LAD disease, occluded RCA with collaterals  02/2014 Carotid Dopplers: 1-39% bilateral ICA with normal Subclavian & vertebral arteries; b. 02/2014 2 D Echo: EF 50-55%, Gr 1 DD, Mild MR & LA dilation -- was bradycardic in 40s  02/2014 Neg Ex MV, EF 65%;  b. 06/2015  Abnl MV ([facility])-->Cath ([facility]): LM nl, LAD 30p/m, LCX 30p, OM2 50p, RCA 100p w/ L-->R collats, nl EF-->Med Rx.   PMH:   has a past medical history of Anemia of chronic disease (07/01/2015), Arthritis, Benign prostatic hyperplasia with nocturia (10/30/2016), Bradycardia (03/12/2014), Bruit of left carotid artery (03/12/2014), CKD (chronic kidney disease) (09/27/2015), Coronary artery disease involving native coronary  artery of native heart without angina pectoris, Diabetes mellitus, type II, insulin dependent, Diabetic neuropathy, Diabetic retinopathy associated with type 2 diabetes mellitus (10/28/2018), Disorder of bone and cartilage, Diverticulitis, DOE (dyspnea on exertion) (03/12/2014), Generalized anxiety disorder, GERD (gastroesophageal reflux disease), Hypertension associated with diabetes, Major depressive disorder, Major neurocognitive disorder due to multiple etiologies (08/04/2020), Mixed hyperlipidemia due to type 2 diabetes mellitus (02/06/2017), Obesity, Osteoporosis, Pure hypercholesterolemia, and Syncope and collapse.  PSH:    Past Surgical History:  Procedure Laterality Date   CARDIAC CATHETERIZATION Left 07/07/2015   Procedure: Left Heart Cath and Coronary Angiography;  Surgeon: Marcina Millard, MD;  Location: [facility] INVASIVE CV LAB;  Service: Cardiovascular;  Laterality: Left;   CARPAL TUNNEL RELEASE     CATARACT EXTRACTION Right    CATARACT EXTRACTION W/PHACO Left 10/06/2015   Procedure: CATARACT EXTRACTION PHACO AND INTRAOCULAR LENS PLACEMENT (IOC);  Surgeon: Sallee Lange, MD;  Location: [facility] ORS;  Service: Ophthalmology;  Laterality: Left;  [place] 01:42 AP% 25.1 CDE 45.92 Fluid pack lot # [id number]   COLONOSCOPY     COLONOSCOPY WITH PROPOFOL N/A 12/04/2018   Procedure: COLONOSCOPY WITH PROPOFOL;  Surgeon: Toledo, Boykin Nearing, MD;  Location: [facility] ENDOSCOPY;  Service: Gastroenterology;  Laterality: N/A;   NM MYOVIEW LTD  03/13/2014   LOW RISK - positive EKG but negative imaging: 2 mm ST Depression in V4 & V5 but No scintigraphic evidence of Ischemia or Infarct o   RIGHT/LEFT HEART CATH AND CORONARY ANGIOGRAPHY N/A 11/21/2017   Procedure: RIGHT/LEFT HEART CATH AND CORONARY ANGIOGRAPHY;  Surgeon: Antonieta Iba, MD;  Location: [facility] INVASIVE CV LAB;  Service: Cardiovascular;  Laterality: N/A;   TRANSTHORACIC ECHOCARDIOGRAM  03/12/2014   EF 50-55%, Gr 1 DD, no RWMA, Mlid MR & LA dil, Normla  RV and bradycardic 40s -    Current Outpatient Medications  Medication Sig Dispense Refill   aspirin EC 81 MG tablet Take 81 mg by mouth daily.     cholecalciferol (VITAMIN D) 1000 UNITS tablet Take 2,000 Units by mouth daily.      Continuous Glucose Receiver (FREESTYLE LIBRE 3 READER) DEVI USE AS DIRECTED TO MONITOR BLOOD GLUCOSE     Continuous Glucose Sensor (DEXCOM G7 SENSOR) MISC Use 1 each every 10 (ten) days     cyanocobalamin (VITAMIN B12) 1000 MCG tablet Take 1,000 mcg by mouth daily.     donepezil (ARICEPT) 10 MG tablet Take 1 tablet (10 mg total) by mouth daily. 90 tablet 3   escitalopram (LEXAPRO) 20 MG tablet Take 1 and 1/2 tablets daily 135 tablet 3   ferrous sulfate 325 (65 FE) MG tablet Take 325 mg by mouth daily with breakfast.     furosemide (LASIX) 20 MG tablet Take 1 tablet (20 mg total) by mouth 3 (three) times a week. 90 tablet 3   gabapentin (NEURONTIN) 300 MG capsule Take 300-600 mg by mouth See admin instructions. Take 1 capsule (300 mg) by mouth in the morning & 2 capsules (600 mg) by mouth every night.     hydrALAZINE (APRESOLINE) 25 MG tablet Take 1 tablet by mouth 3 (three) times daily.  insulin detemir (LEVEMIR) 100 UNIT/ML injection Inject 50 Units into the skin daily.     levothyroxine (SYNTHROID) 50 MCG tablet Take 50 mcg by mouth daily before breakfast.     metFORMIN (GLUCOPHAGE) 500 MG tablet Take 250 mg by mouth 2 (two) times daily with a meal.     mirabegron ER (MYRBETRIQ) 50 MG TB24 tablet Take 1 tablet (50 mg total) by mouth daily. 30 tablet 11   NOVOLOG 100 UNIT/ML injection Inject 20-38 Units into the skin 3 (three) times daily with meals. Sliding scale insulin base rates= 12 units in the morning, 20 units with lunch,18 units with supper.     simvastatin (ZOCOR) 20 MG tablet Take 20 mg by mouth daily.     tamsulosin (FLOMAX) 0.4 MG CAPS capsule Take 0.4 mg by mouth daily. Takes 2 each morning.     traMADol (ULTRAM) 50 MG tablet Take 25-50 mg by mouth  2 (two) times daily as needed.     TRESIBA FLEXTOUCH 100 UNIT/ML FlexTouch Pen 31 Units at night     acetaminophen (TYLENOL) 325 MG tablet Take 650 mg by mouth every 6 (six) hours as needed. (Patient not taking: Reported on 03/27/2023)     calcium carbonate (OS-CAL) 600 MG TABS tablet Take 600 mg by mouth daily with breakfast. (Patient not taking: Reported on 02/08/2023)     Continuous Blood Gluc Sensor (FREESTYLE LIBRE 2 SENSOR) MISC USE AS DIRECTED - REPLACE EVERY 14 DAYS (Patient not taking: Reported on 03/27/2023)     isosorbide mononitrate (IMDUR) 30 MG 24 hr tablet Take 1 tablet (30 mg total) by mouth daily. 90 tablet 3   No current facility-administered medications for this visit.    Allergies:   Amlodipine   Social History:  The patient  reports that he quit smoking about 60 years ago. His smoking use included pipe. He has never been exposed to tobacco smoke. He has never used smokeless tobacco. He reports that he does not drink alcohol and does not use drugs.   Family History:   family history includes Breast cancer in his mother; Heart attack in his mother; Lung cancer in his father.   Review of Systems: Review of Systems  Constitutional: Negative.   Respiratory:  Positive for shortness of breath.   Cardiovascular: Negative.   Gastrointestinal: Negative.   Musculoskeletal: Negative.   Neurological: Negative.   Psychiatric/Behavioral: Negative.    All other systems reviewed and are negative.  PHYSICAL EXAM: VS:  BP (!) 100/50 (BP Location: Left Arm, Patient Position: Sitting, Cuff Size: Normal)   Pulse (!) 53   Ht 5' 8.5" (1.74 m)   Wt 200 lb (90.7 kg)   SpO2 98%   BMI 29.97 kg/m  , BMI Body mass index is 29.97 kg/m. Constitutional:  oriented to person, place, and time. No distress.  HENT:  Head: Grossly normal Eyes:  no discharge. No scleral icterus.  Neck: No JVD, no carotid bruits  Cardiovascular: Regular rate and rhythm, no murmurs appreciated Pulmonary/Chest:  Clear to auscultation bilaterally, no wheezes or rails Abdominal: Soft.  no distension.  no tenderness.  Musculoskeletal: Normal range of motion Neurological:  normal muscle tone. Coordination normal. No atrophy Skin: Skin warm and dry Psychiatric: normal affect, pleasant  Recent Labs: 02/08/2023: ALT 20; BNP 126.1 02/28/2023: BUN 25; Creatinine, Ser 1.57; Potassium 4.5; Sodium 143    Lipid Panel No results found for: "CHOL", "HDL", "LDLCALC", "TRIG"    Wt Readings from Last 3 Encounters:  03/27/23 200 lb (  90.7 kg)  02/08/23 203 lb 12.8 oz (92.4 kg)  11/07/22 202 lb 9.6 oz (91.9 kg)     ASSESSMENT AND PLAN:  Coronary artery disease with stable angina catheterization November 2019 showing no significant change in his disease RCA occluded with collaterals from left to right -Underwent stress test May 2022 with no ischemia Currently with no symptoms of angina. No further workup at this time. Continue current medication regimen.  Chronic shortness of breath Dating back before 2017 Catheterization 2017, repeat right and left heart catheterization for similar symptoms 2019, normal right heart pressures noted Recommend he continue Lasix 3 days a week, creatinine 1.5 stable over the past 3 years Will try to avoid hypovolemia Minimal leg swelling on today's visit, appears relatively euvolemic, weight stable Deconditioning likely playing a role, recommended regular walking program, low carbohydrate diet for weight loss  Essential hypertension -  Recommend they move the isosorbide later in the day.  Blood pressure was low in the office today Recommend they monitor blood pressures after he take his two Flomax in the morning.  If low may need to change Flomax to 1 twice daily  Mixed hyperlipidemia -  Cholesterol is at goal on the current lipid regimen. No changes to the medications were made.  Diabetes mellitus, type II, insulin dependent (HCC) - Plan: EKG 12-Lead Followed by  endocrine, recommended low carbohydrate diet Recommend they work closely with endocrine in effort to improve his numbers  Chronic renal impairment, stage 3 (moderate)  In the setting of diabetes Creatinine 1.5 stable over the past 3 years Avoid NSAIDs  Weakness Chronic issue, recommend regular walking program, PT TD at home  Orders Placed This Encounter  Procedures   EKG 12-Lead     Signed, Dossie Arbour, M.D., Ph.D. 03/27/2023  [facility] [place], [place] [phone]

## 2023-03-27 ENCOUNTER — Ambulatory Visit: Payer: PPO | Attending: Cardiovascular Disease | Admitting: Cardiovascular Disease

## 2023-03-27 ENCOUNTER — Encounter: Payer: Self-pay | Admitting: Cardiovascular Disease

## 2023-03-27 VITALS — BP 100/50 | HR 53 | Ht 68.5 in | Wt 200.0 lb

## 2023-03-27 DIAGNOSIS — R0609 Other forms of dyspnea: Secondary | ICD-10-CM | POA: Diagnosis not present

## 2023-03-27 DIAGNOSIS — M7989 Other specified soft tissue disorders: Secondary | ICD-10-CM | POA: Diagnosis not present

## 2023-03-27 DIAGNOSIS — I5032 Chronic diastolic (congestive) heart failure: Secondary | ICD-10-CM

## 2023-03-27 DIAGNOSIS — R001 Bradycardia, unspecified: Secondary | ICD-10-CM | POA: Diagnosis not present

## 2023-03-27 DIAGNOSIS — I10 Essential (primary) hypertension: Secondary | ICD-10-CM | POA: Diagnosis not present

## 2023-03-27 DIAGNOSIS — N183 Chronic kidney disease, stage 3 unspecified: Secondary | ICD-10-CM

## 2023-03-27 DIAGNOSIS — E785 Hyperlipidemia, unspecified: Secondary | ICD-10-CM | POA: Diagnosis not present

## 2023-03-27 DIAGNOSIS — I2510 Atherosclerotic heart disease of native coronary artery without angina pectoris: Secondary | ICD-10-CM

## 2023-03-27 MED ORDER — ISOSORBIDE MONONITRATE ER 30 MG PO TB24: 30.0000 mg | ORAL_TABLET | Freq: Every day | ORAL | 3 refills | Status: DC

## 2023-03-27 NOTE — Patient Instructions (Addendum)
 Medication Instructions:  No changes  Move the isosorbide to the PM  If you need a refill on your cardiac medications before your next appointment, please call your pharmacy.   Lab work: No new labs needed  Testing/Procedures: No new testing needed  Follow-Up: At [facility], you and your health needs are our priority.  As part of our continuing mission to provide you with exceptional heart care, we have created designated Provider Care Teams.  These Care Teams include your primary Cardiologist (physician) and Advanced Practice Providers (APPs -  Physician Assistants and Nurse Practitioners) who all work together to provide you with the care you need, when you need it.  You will need a follow up appointment in 12 months  Providers on your designated Care Team:   Nicolasa Ducking, NP Eula Listen, PA-C Cadence Fransico Michael, [place]  COVID-19 Vaccine Information can be found at: [email or web address] For questions related to vaccine distribution or appointments, please email [email or web address] or call [phone].

## 2023-03-28 DIAGNOSIS — H35372 Puckering of macula, left eye: Secondary | ICD-10-CM | POA: Diagnosis not present

## 2023-03-28 DIAGNOSIS — Z961 Presence of intraocular lens: Secondary | ICD-10-CM | POA: Diagnosis not present

## 2023-03-28 DIAGNOSIS — H43813 Vitreous degeneration, bilateral: Secondary | ICD-10-CM | POA: Diagnosis not present

## 2023-03-28 DIAGNOSIS — E119 Type 2 diabetes mellitus without complications: Secondary | ICD-10-CM | POA: Diagnosis not present

## 2023-03-30 DIAGNOSIS — G4733 Obstructive sleep apnea (adult) (pediatric): Secondary | ICD-10-CM | POA: Diagnosis not present

## 2023-04-04 ENCOUNTER — Ambulatory Visit: Payer: PPO | Admitting: Physician Assistant

## 2023-04-04 ENCOUNTER — Encounter: Payer: Self-pay | Admitting: Physician Assistant

## 2023-04-04 VITALS — BP 123/60 | HR 60

## 2023-04-04 DIAGNOSIS — R350 Frequency of micturition: Secondary | ICD-10-CM

## 2023-04-04 DIAGNOSIS — N401 Enlarged prostate with lower urinary tract symptoms: Secondary | ICD-10-CM | POA: Diagnosis not present

## 2023-04-04 DIAGNOSIS — R3915 Urgency of urination: Secondary | ICD-10-CM

## 2023-04-04 DIAGNOSIS — R3916 Straining to void: Secondary | ICD-10-CM

## 2023-04-04 LAB — BLADDER SCAN AMB NON-IMAGING: Scan Result: 88

## 2023-04-04 NOTE — Progress Notes (Signed)
 04/04/2023 12:40 PM   [name] 1940/01/26 [id number]  CC: Chief Complaint  Patient presents with   Over Active Bladder   HPI: Ronald Wallace is a 83 y.o. male with PMH dementia, BPH on Flomax 0.8 mg daily, and OAB wet on Myrbetriq 50 mg who failed Gemtesa due to cost who presents today for follow-up.  He is accompanied today by a caregiver.  Today he reports some improvement on Myrbetriq, however he is not yet at his treatment goal..  He is most bothered by urgency.  He wears 3-4 depends daily and they can be dry.  He does not wish to pursue surgical intervention.  IPSS 17/unhappy as below.  PVR 88mL.   IPSS     Row Name 04/04/23 1100         International Prostate Symptom Score   How often have you had the sensation of not emptying your bladder? Less than 1 in 5     How often have you had to urinate less than every two hours? Less than 1 in 5 times     How often have you found you stopped and started again several times when you urinated? More than half the time     How often have you found it difficult to postpone urination? More than half the time     How often have you had a weak urinary stream? Less than half the time     How often have you had to strain to start urination? More than half the time     How many times did you typically get up at night to urinate? 1 Time     Total IPSS Score 17       Quality of Life due to urinary symptoms   If you were to spend the rest of your life with your urinary condition just the way it is now how would you feel about that? Unhappy               PMH: Past Medical History:  Diagnosis Date   Anemia of chronic disease 07/01/2015   Arthritis    Benign prostatic hyperplasia with nocturia 10/30/2016   Bradycardia 03/12/2014   Bruit of left carotid artery 03/12/2014   CKD (chronic kidney disease) 09/27/2015   stage 3, GFR 30-59 ml/min   Coronary artery disease involving native coronary artery of native heart without  angina pectoris    a. 02/2014 Neg Ex MV, EF 65%;  b. 06/2015 Abnl MV ([facility])-->Cath ([facility]): LM nl, LAD 30p/m, LCX 30p, OM2 50p, RCA 100p w/ L-->R collats, nl EF-->Med Rx; c. 10/2017 MV: No isch/scar; d. 11/2017 Cath: LM nl, LAD 45p/m, LCX 30ost, OM2 30, RCA 100 CTO, L->R collats. PA 31/16/22, PCWP 11; e. 05/2020 MV: EF 67%, no isch/scar->low risk.   Diabetes mellitus, type II, insulin dependent    Diabetic neuropathy    Diabetic retinopathy associated with type 2 diabetes mellitus 10/28/2018   Disorder of bone and cartilage    Diverticulitis    DOE (dyspnea on exertion) 03/12/2014   Generalized anxiety disorder    GERD (gastroesophageal reflux disease)    Hypertension associated with diabetes    Major depressive disorder    Major neurocognitive disorder due to multiple etiologies 08/04/2020   likely Alzheimer's disease w/vascular contributions   Mixed hyperlipidemia due to type 2 diabetes mellitus 02/06/2017   Obesity    Osteoporosis    Pure hypercholesterolemia    Syncope and collapse  a. 02/2014 Carotid Dopplers: 1-39% bilateral ICA with normal Subclavian & vertebral arteries; b. 02/2014 2 D Echo: EF 50-55%, Gr 1 DD, Mild MR & LA dilation -- was bradycardic in 40s; c. 04/2014 Event monitor: RSR/SA/SB. Occas PVC's. No AFib.    Surgical History: Past Surgical History:  Procedure Laterality Date   CARDIAC CATHETERIZATION Left 07/07/2015   Procedure: Left Heart Cath and Coronary Angiography;  Surgeon: Marcina Millard, MD;  Location: [facility] INVASIVE CV LAB;  Service: Cardiovascular;  Laterality: Left;   CARPAL TUNNEL RELEASE     CATARACT EXTRACTION Right    CATARACT EXTRACTION W/PHACO Left 10/06/2015   Procedure: CATARACT EXTRACTION PHACO AND INTRAOCULAR LENS PLACEMENT (IOC);  Surgeon: Sallee Lange, MD;  Location: [facility] ORS;  Service: Ophthalmology;  Laterality: Left;  [place] 01:42 AP% 25.1 CDE 45.92 Fluid pack lot # [id number]   COLONOSCOPY     COLONOSCOPY WITH PROPOFOL N/A  12/04/2018   Procedure: COLONOSCOPY WITH PROPOFOL;  Surgeon: Toledo, Boykin Nearing, MD;  Location: [facility] ENDOSCOPY;  Service: Gastroenterology;  Laterality: N/A;   NM MYOVIEW LTD  03/13/2014   LOW RISK - positive EKG but negative imaging: 2 mm ST Depression in V4 & V5 but No scintigraphic evidence of Ischemia or Infarct o   RIGHT/LEFT HEART CATH AND CORONARY ANGIOGRAPHY N/A 11/21/2017   Procedure: RIGHT/LEFT HEART CATH AND CORONARY ANGIOGRAPHY;  Surgeon: Antonieta Iba, MD;  Location: [facility] INV[facility] CV LAB;  Service: Cardiovascular;  Laterality: N/A;   TRANSTHORACIC ECHOCARDIOGRAM  03/12/2014   EF 50-55%, Gr 1 DD, no RWMA, Mlid MR & LA dil, Normla RV and bradycardic 40s -    Home Medications:  Allergies as of 04/04/2023       Reactions   Amlodipine    Other reaction(s): Other (See Comments) Peripheral edema        Medication List        Accurate as of April 04, 2023 12:40 PM. If you have any questions, ask your nurse or doctor.          acetaminophen 325 MG tablet Commonly known as: TYLENOL Take 650 mg by mouth every 6 (six) hours as needed.   aspirin EC 81 MG tablet Take 81 mg by mouth daily.   calcium carbonate 600 MG Tabs tablet Commonly known as: OS-CAL Take 600 mg by mouth daily with breakfast.   cholecalciferol 1000 units tablet Commonly known as: VITAMIN D Take 2,000 Units by mouth daily.   cyanocobalamin 1000 MCG tablet Commonly known as: VITAMIN B12 Take 1,000 mcg by mouth daily.   donepezil 10 MG tablet Commonly known as: ARICEPT Take 1 tablet (10 mg total) by mouth daily.   escitalopram 20 MG tablet Commonly known as: Lexapro Take 1 and 1/2 tablets daily   ferrous sulfate 325 (65 FE) MG tablet Take 325 mg by mouth daily with breakfast.   FreeStyle Libre 2 Sensor Misc USE AS DIRECTED - REPLACE EVERY 14 DAYS   Dexcom G7 Sensor Misc Use 1 each every 10 (ten) days   FreeStyle Libre 3 Reader Devi USE AS DIRECTED TO MONITOR BLOOD GLUCOSE    furosemide 20 MG tablet Commonly known as: LASIX Take 1 tablet (20 mg total) by mouth 3 (three) times a week.   gabapentin 300 MG capsule Commonly known as: NEURONTIN Take 300-600 mg by mouth See admin instructions. Take 1 capsule (300 mg) by mouth in the morning & 2 capsules (600 mg) by mouth every night.   hydrALAZINE 25 MG tablet Commonly known as: APRESOLINE  Take 1 tablet by mouth 3 (three) times daily.   insulin detemir 100 UNIT/ML injection Commonly known as: LEVEMIR Inject 50 Units into the skin daily.   isosorbide mononitrate 30 MG 24 hr tablet Commonly known as: IMDUR Take 1 tablet (30 mg total) by mouth daily.   levothyroxine 50 MCG tablet Commonly known as: SYNTHROID Take 50 mcg by mouth daily before breakfast.   metFORMIN 500 MG tablet Commonly known as: GLUCOPHAGE Take 250 mg by mouth 2 (two) times daily with a meal.   mirabegron ER 50 MG Tb24 tablet Commonly known as: MYRBETRIQ Take 1 tablet (50 mg total) by mouth daily.   NovoLOG 100 UNIT/ML injection Generic drug: insulin aspart Inject 20-38 Units into the skin 3 (three) times daily with meals. Sliding scale insulin base rates= 12 units in the morning, 20 units with lunch,18 units with supper.   simvastatin 20 MG tablet Commonly known as: ZOCOR Take 20 mg by mouth daily.   tamsulosin 0.4 MG Caps capsule Commonly known as: FLOMAX Take 0.4 mg by mouth daily. Takes 2 each morning.   traMADol 50 MG tablet Commonly known as: ULTRAM Take 25-50 mg by mouth 2 (two) times daily as needed.   Evaristo Bury FlexTouch 100 UNIT/ML FlexTouch Pen Generic drug: insulin degludec 31 Units at night        Allergies:  Allergies  Allergen Reactions   Amlodipine     Other reaction(s): Other (See Comments) Peripheral edema    Family History: Family History  Problem Relation Age of Onset   Heart attack Mother    Breast cancer Mother    Lung cancer Father     Social History:   reports that he quit smoking  about 60 years ago. His smoking use included pipe. He has never been exposed to tobacco smoke. He has never used smokeless tobacco. He reports that he does not drink alcohol and does not use drugs.  Physical Exam: BP 123/60   Pulse 60   Constitutional:  Alert and oriented, no acute distress, nontoxic appearing HEENT: [place], AT Cardiovascular: No clubbing, cyanosis, or edema Respiratory: Normal respiratory effort, no increased work of breathing Skin: No rashes, bruises or suspicious lesions Neurologic: Grossly intact, no focal deficits, moving all 4 extremities Psychiatric: Normal mood and affect  Laboratory Data: Results for orders placed or performed in visit on 04/04/23  BLADDER SCAN AMB NON-IMAGING   Collection Time: 04/04/23 11:38 AM  Result Value Ref Range   Scan Result 88    Assessment & Plan:   1. Urinary urgency (Primary) Per IPSS, he reports mixed obstructive and storage related symptoms, however he is primarily bothered by his storage related symptoms.  Will continue Myrbetriq, since he did have some symptomatic improvement with this.  We discussed augmenting with PTNS.  He is unsure if this will be feasible because he no longer drives himself, but would like to discuss with his daughter and let us know if he wishes to proceed.  I do not think he is a good candidate for intravesical Botox with his BPH and obstructive symptoms and he does not wish to pursue surgery, so InterStim is also not an option. - BLADDER SCAN AMB NON-IMAGING  2. Benign prostatic hyperplasia (BPH) with straining on urination He is on maximum dose Flomax, but continues to describe obstructive symptoms including straining and intermittency.  We discussed augmenting him with finasteride and he would like to discuss it with his daughter and let me know.  Return in about 1  year (around 04/03/2024) for Annual IPSS/PVR.  Carman Ching, PA-C  [facility] [place] [address] [place], [place] [address] [phone]

## 2023-04-04 NOTE — Patient Instructions (Signed)
 Please continue Flomax (tamsulosin) two pills daily.  Today we discussed that you are most bothered by your urgency to urinate and urinary leakage, however on your survey you also describe straining to urinate and a urine stream that starts and stops. This is a bit of a mixed picture, with enlarged prostate issues causing the straining and stopping and starting, and more of an overactive bladder causing the urgency and leakage. Overactive bladder can often happen in response to an enlarged prostate, so these are likely related issues.  We discussed two options at this point: Start PTNS (percutaneous tibial nerve stimulation) treatments, which would require 12 30-minute weekly treatments in clinic, followed by monthly 30-minute maintenance treatments if you did well with this. This would address the overactive bladder issues of urgency and leakage, and we can combine this therapy with Myrbetriq. Start finasteride, another medication for enlarged prostate that works to shrink the prostate in size over time. This would address the straining and stopping and starting but may not help with the overactive bladder symptoms. This is a daily medication that you would stay on long-term, but it takes 6-9 months for the full effect to occur so improvement would be slow and gradual.  Please discuss these options with your family and let me know how you'd like to proceed. I'll plan to see you back in clinic in 1 year, or sooner if you decide to do PTNS.

## 2023-04-11 ENCOUNTER — Telehealth: Payer: Self-pay

## 2023-04-11 NOTE — Telephone Encounter (Signed)
 Received a message that patient wanted to discuss PTNS scheduling, I was not giving a certain number to call back so I called the main number listed in the chart and left a message.  No PA is needed, patient will have a co pay $15 each visit x 12 weeks and then needs 1 month f.u after last PTNS. Left message for patient to call back to discuss if still interested.

## 2023-04-12 NOTE — Telephone Encounter (Signed)
 Patient's daughter Astrid Drafts) lvm returning call to Anastasiya. She said she can be reached after 3:30 pm at [phone].

## 2023-04-13 NOTE — Telephone Encounter (Signed)
 Spoke with Carollee Herter and advised of below information, appointments scheduled.

## 2023-04-18 ENCOUNTER — Other Ambulatory Visit: Payer: Self-pay

## 2023-04-18 MED ORDER — ISOSORBIDE MONONITRATE ER 30 MG PO TB24: 30.0000 mg | ORAL_TABLET | Freq: Every day | ORAL | 3 refills | Status: AC

## 2023-04-30 DIAGNOSIS — G4733 Obstructive sleep apnea (adult) (pediatric): Secondary | ICD-10-CM | POA: Diagnosis not present

## 2023-05-02 DIAGNOSIS — E785 Hyperlipidemia, unspecified: Secondary | ICD-10-CM | POA: Diagnosis not present

## 2023-05-02 DIAGNOSIS — Z794 Long term (current) use of insulin: Secondary | ICD-10-CM | POA: Diagnosis not present

## 2023-05-02 DIAGNOSIS — E1169 Type 2 diabetes mellitus with other specified complication: Secondary | ICD-10-CM | POA: Diagnosis not present

## 2023-05-02 DIAGNOSIS — E1142 Type 2 diabetes mellitus with diabetic polyneuropathy: Secondary | ICD-10-CM | POA: Diagnosis not present

## 2023-05-02 DIAGNOSIS — I152 Hypertension secondary to endocrine disorders: Secondary | ICD-10-CM | POA: Diagnosis not present

## 2023-05-02 DIAGNOSIS — E1159 Type 2 diabetes mellitus with other circulatory complications: Secondary | ICD-10-CM | POA: Diagnosis not present

## 2023-05-02 DIAGNOSIS — E039 Hypothyroidism, unspecified: Secondary | ICD-10-CM | POA: Diagnosis not present

## 2023-05-03 DIAGNOSIS — D2262 Melanocytic nevi of left upper limb, including shoulder: Secondary | ICD-10-CM | POA: Diagnosis not present

## 2023-05-03 DIAGNOSIS — D2271 Melanocytic nevi of right lower limb, including hip: Secondary | ICD-10-CM | POA: Diagnosis not present

## 2023-05-03 DIAGNOSIS — D044 Carcinoma in situ of skin of scalp and neck: Secondary | ICD-10-CM | POA: Diagnosis not present

## 2023-05-03 DIAGNOSIS — Z86006 Personal history of melanoma in-situ: Secondary | ICD-10-CM | POA: Diagnosis not present

## 2023-05-03 DIAGNOSIS — D485 Neoplasm of uncertain behavior of skin: Secondary | ICD-10-CM | POA: Diagnosis not present

## 2023-05-03 DIAGNOSIS — Z85820 Personal history of malignant melanoma of skin: Secondary | ICD-10-CM | POA: Diagnosis not present

## 2023-05-03 DIAGNOSIS — D2272 Melanocytic nevi of left lower limb, including hip: Secondary | ICD-10-CM | POA: Diagnosis not present

## 2023-05-03 DIAGNOSIS — Z85828 Personal history of other malignant neoplasm of skin: Secondary | ICD-10-CM | POA: Diagnosis not present

## 2023-05-03 DIAGNOSIS — D2261 Melanocytic nevi of right upper limb, including shoulder: Secondary | ICD-10-CM | POA: Diagnosis not present

## 2023-05-03 DIAGNOSIS — L570 Actinic keratosis: Secondary | ICD-10-CM | POA: Diagnosis not present

## 2023-05-16 ENCOUNTER — Ambulatory Visit: Admitting: Urology

## 2023-05-16 DIAGNOSIS — R3915 Urgency of urination: Secondary | ICD-10-CM | POA: Diagnosis not present

## 2023-05-16 DIAGNOSIS — R350 Frequency of micturition: Secondary | ICD-10-CM

## 2023-05-16 NOTE — Patient Instructions (Signed)

## 2023-05-16 NOTE — Progress Notes (Signed)
 PTNS  Session # 1/12  Health & Social Factors: No change  Caffeine: 0 Alcohol: 0 Daytime voids #per day: 7 Night-time voids #per night: 4 Urgency: Severe Incontinence Episodes #per day: 2  Ankle used: Left  Treatment Setting: 5 Feeling/ Response: Sensory  Comments: Patient tolerated  Performed By: Matilde Son, PA-C   Follow Up: One week for # 2/12 PTNS

## 2023-05-21 ENCOUNTER — Ambulatory Visit: Payer: PPO | Admitting: Physician Assistant

## 2023-05-23 ENCOUNTER — Ambulatory Visit: Admitting: Urology

## 2023-05-25 DIAGNOSIS — F419 Anxiety disorder, unspecified: Secondary | ICD-10-CM | POA: Diagnosis not present

## 2023-05-25 DIAGNOSIS — F32A Depression, unspecified: Secondary | ICD-10-CM | POA: Diagnosis not present

## 2023-05-28 DIAGNOSIS — Z111 Encounter for screening for respiratory tuberculosis: Secondary | ICD-10-CM | POA: Diagnosis not present

## 2023-05-30 ENCOUNTER — Ambulatory Visit: Admitting: Urology

## 2023-05-30 ENCOUNTER — Encounter: Payer: Self-pay | Admitting: Urology

## 2023-05-30 VITALS — BP 152/69 | HR 60 | Ht 68.5 in | Wt 200.0 lb

## 2023-05-30 DIAGNOSIS — G4733 Obstructive sleep apnea (adult) (pediatric): Secondary | ICD-10-CM | POA: Diagnosis not present

## 2023-05-30 DIAGNOSIS — R3915 Urgency of urination: Secondary | ICD-10-CM | POA: Diagnosis not present

## 2023-05-30 NOTE — Progress Notes (Signed)
 PTNS  Session # 2/12  Health & Social Factors: No changes Caffeine: 1 Alcohol: 1 Daytime voids #per day: 6 Night-time voids #per night: 1 Urgency: Severe Incontinence Episodes #per day: 2 Ankle used: Left Treatment Setting: 11 Feeling/ Response: Sensory Comments: Patient tolerated   Performed By: Matilde Son, PA-C   Follow Up: 1 week for number 3 out of 12 PTNS

## 2023-05-30 NOTE — Patient Instructions (Signed)

## 2023-06-06 ENCOUNTER — Ambulatory Visit: Admitting: Urology

## 2023-06-06 ENCOUNTER — Encounter: Payer: Self-pay | Admitting: Urology

## 2023-06-06 VITALS — Ht 68.5 in | Wt 200.0 lb

## 2023-06-06 DIAGNOSIS — R3915 Urgency of urination: Secondary | ICD-10-CM | POA: Diagnosis not present

## 2023-06-06 NOTE — Progress Notes (Signed)
 PTNS  Session # 3  Health & Social Factors: no change Caffeine: No Alcohol: No  Daytime voids #per day: 6-8 Night-time voids #per night: 1 Urgency: strong Incontinence Episodes #per day: 1-2 Ankle used: right Treatment Setting: 2 Feeling/ Response: Sensory Comments: Patient tolerated  Performed By: Matilde Son PA-C   Follow Up: One week #4/12 PTNS

## 2023-06-06 NOTE — Patient Instructions (Signed)

## 2023-06-07 DIAGNOSIS — E1142 Type 2 diabetes mellitus with diabetic polyneuropathy: Secondary | ICD-10-CM | POA: Diagnosis not present

## 2023-06-07 DIAGNOSIS — Z794 Long term (current) use of insulin: Secondary | ICD-10-CM | POA: Diagnosis not present

## 2023-06-08 DIAGNOSIS — E785 Hyperlipidemia, unspecified: Secondary | ICD-10-CM | POA: Diagnosis not present

## 2023-06-08 DIAGNOSIS — D696 Thrombocytopenia, unspecified: Secondary | ICD-10-CM | POA: Diagnosis not present

## 2023-06-08 DIAGNOSIS — D638 Anemia in other chronic diseases classified elsewhere: Secondary | ICD-10-CM | POA: Diagnosis not present

## 2023-06-08 DIAGNOSIS — E1169 Type 2 diabetes mellitus with other specified complication: Secondary | ICD-10-CM | POA: Diagnosis not present

## 2023-06-13 ENCOUNTER — Ambulatory Visit (INDEPENDENT_AMBULATORY_CARE_PROVIDER_SITE_OTHER): Admitting: Physician Assistant

## 2023-06-13 DIAGNOSIS — R3915 Urgency of urination: Secondary | ICD-10-CM

## 2023-06-13 NOTE — Progress Notes (Signed)
 PTNS  Session # 4  Health & Social Factors: no change Caffeine: 0 Alcohol: 0 Daytime voids #per day: 5 Night-time voids #per night: 3 Urgency: severe Incontinence Episodes #per day: 5 Ankle used: right Treatment Setting: 1 Feeling/ Response: sensory Comments: Patient tolerated well.  Performed By: Vinisha Faxon, PA-C   Follow Up: 1 week

## 2023-06-13 NOTE — Patient Instructions (Signed)

## 2023-06-18 NOTE — Progress Notes (Unsigned)
 PTNS  Session # 5/12  Health & Social Factors: No changes Caffeine: 0 Alcohol: 0 Daytime voids #per day: 6 Night-time voids #per night: 2 Urgency: mild Incontinence Episodes #per day: 0 Ankle used: Left Treatment Setting: 2 Feeling/ Response: Sensory Comments: Patient tolerated  Performed By: Matilde Son, PA-C   Follow Up: One week for #6/12 PTNS

## 2023-06-19 DIAGNOSIS — D638 Anemia in other chronic diseases classified elsewhere: Secondary | ICD-10-CM | POA: Diagnosis not present

## 2023-06-19 DIAGNOSIS — E66811 Obesity, class 1: Secondary | ICD-10-CM | POA: Diagnosis not present

## 2023-06-19 DIAGNOSIS — N1831 Chronic kidney disease, stage 3a: Secondary | ICD-10-CM | POA: Diagnosis not present

## 2023-06-19 DIAGNOSIS — E6609 Other obesity due to excess calories: Secondary | ICD-10-CM | POA: Diagnosis not present

## 2023-06-19 DIAGNOSIS — F419 Anxiety disorder, unspecified: Secondary | ICD-10-CM | POA: Diagnosis not present

## 2023-06-19 DIAGNOSIS — G309 Alzheimer's disease, unspecified: Secondary | ICD-10-CM | POA: Diagnosis not present

## 2023-06-19 DIAGNOSIS — E785 Hyperlipidemia, unspecified: Secondary | ICD-10-CM | POA: Diagnosis not present

## 2023-06-19 DIAGNOSIS — Z6834 Body mass index (BMI) 34.0-34.9, adult: Secondary | ICD-10-CM | POA: Diagnosis not present

## 2023-06-19 DIAGNOSIS — E1169 Type 2 diabetes mellitus with other specified complication: Secondary | ICD-10-CM | POA: Diagnosis not present

## 2023-06-19 DIAGNOSIS — F01518 Vascular dementia, unspecified severity, with other behavioral disturbance: Secondary | ICD-10-CM | POA: Diagnosis not present

## 2023-06-19 DIAGNOSIS — F02818 Dementia in other diseases classified elsewhere, unspecified severity, with other behavioral disturbance: Secondary | ICD-10-CM | POA: Diagnosis not present

## 2023-06-19 DIAGNOSIS — F32A Depression, unspecified: Secondary | ICD-10-CM | POA: Diagnosis not present

## 2023-06-20 ENCOUNTER — Ambulatory Visit (INDEPENDENT_AMBULATORY_CARE_PROVIDER_SITE_OTHER): Admitting: Urology

## 2023-06-20 DIAGNOSIS — R3915 Urgency of urination: Secondary | ICD-10-CM

## 2023-06-27 ENCOUNTER — Ambulatory Visit: Admitting: Physician Assistant

## 2023-06-27 ENCOUNTER — Encounter: Payer: Self-pay | Admitting: Physician Assistant

## 2023-06-27 VITALS — BP 123/69 | HR 50 | Ht 68.5 in | Wt 200.0 lb

## 2023-06-27 DIAGNOSIS — R3915 Urgency of urination: Secondary | ICD-10-CM | POA: Diagnosis not present

## 2023-06-27 NOTE — Progress Notes (Signed)
 PTNS  Session # 6  Health & Social Factors: Increased right sided sciatica this week Caffeine: 0 Alcohol: 0 Daytime voids #per day: 3-4 Night-time voids #per night: 2 Urgency: severe Incontinence Episodes #per day: 1 Ankle used: right Treatment Setting: 14 Feeling/ Response: sensory Comments: Patient tolerated well.  Performed By: Ola Raap, PA-C   Follow Up: 1 week

## 2023-06-27 NOTE — Patient Instructions (Signed)

## 2023-06-28 ENCOUNTER — Ambulatory Visit: Admitting: Physician Assistant

## 2023-06-28 ENCOUNTER — Encounter: Payer: Self-pay | Admitting: Physician Assistant

## 2023-06-28 VITALS — BP 142/65 | HR 57 | Resp 20 | Ht 68.5 in | Wt 202.0 lb

## 2023-06-28 DIAGNOSIS — G309 Alzheimer's disease, unspecified: Secondary | ICD-10-CM

## 2023-06-28 DIAGNOSIS — F01518 Vascular dementia, unspecified severity, with other behavioral disturbance: Secondary | ICD-10-CM

## 2023-06-28 DIAGNOSIS — F02818 Dementia in other diseases classified elsewhere, unspecified severity, with other behavioral disturbance: Secondary | ICD-10-CM

## 2023-06-28 NOTE — Patient Instructions (Addendum)
.  plaGood to see you.  Continue all your medications  2. Continue close supervision  3. Follow-up with Memory Disorders PA Tex Filbert in 1 year, Dr Ty Gales will see you in 6 month   Dr. Alethea Hutchinson - [phone] [facility]) - [phone] [facility] [place]) - [phone] Dr. Daphine Eagle [facility]) [phone] [facility] [place]) [phone] [facility] [place]) - [phone] [facility] [place]) [phone] [facility], [address] [place], [place] [phone] Dr. Ricky Charter (neuropsychiatry); Margot Sheng; [facility]; 9th Floor; [place], [place] [phone] Dr. Todd Fossa; [facility]; [address]; [place], [place] [address]  [phone]     FALL PRECAUTIONS: Be cautious when walking. Scan the area for obstacles that may increase the risk of trips and falls. When getting up in the mornings, sit up at the edge of the bed for a few minutes before getting out of bed. Consider elevating the bed at the head end to avoid drop of blood pressure when getting up. Walk always in a well-lit room (use night lights in the walls). Avoid area rugs or power cords from appliances in the middle of the walkways. Use a walker or a cane if necessary and consider physical therapy for balance exercise. Get your eyesight checked regularly.  HOME SAFETY: Consider the safety of the kitchen when operating appliances like stoves, microwave oven, and blender. Consider having supervision and share cooking responsibilities until no longer able to participate in those. Accidents with firearms and other hazards in the house should be identified and addressed as well.  ABILITY TO BE LEFT ALONE: If patient is unable to contact 911 operator, consider using LifeLine, or when the need is there,  arrange for someone to stay with patients. Smoking is a fire hazard, consider supervision or cessation. Risk of wandering should be assessed by caregiver and if detected at any point, supervision and safe proof recommendations should be instituted.  RECOMMENDATIONS FOR ALL PATIENTS WITH MEMORY PROBLEMS: 1. Continue to exercise (Recommend 30 minutes of walking everyday, or 3 hours every week) 2. Increase social interactions - continue going to [place] and enjoy social gatherings with friends and family 3. Eat healthy, avoid fried foods and eat more fruits and vegetables 4. Maintain adequate blood pressure, blood sugar, and blood cholesterol level. Reducing the risk of stroke and cardiovascular disease also helps promoting better memory. 5. Avoid stressful situations. Live a simple life and avoid aggravations. Organize your time and prepare for the next day in anticipation. 6. Sleep well, avoid any interruptions of sleep and avoid any distractions in the bedroom that may interfere with adequate sleep quality 7. Avoid sugar, avoid sweets as there is a strong link between excessive sugar intake, diabetes, and cognitive impairment The Mediterranean diet has been shown to help patients reduce the risk of progressive memory disorders and reduces cardiovascular risk. This includes eating fish, eat fruits and green leafy vegetables, nuts like almonds and hazelnuts, walnuts, and also use olive oil. Avoid fast foods and fried foods as much as possible. Avoid sweets and sugar as sugar use has been linked to worsening of memory function.  There is always a concern of gradual progression of memory problems. If this is the case, then we may need to adjust level of care according to patient needs. Support, both to the patient and caregiver, should then be put into place.

## 2023-06-28 NOTE — Progress Notes (Addendum)
 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 RH male with a history of hypertension, hyperlipidemia, diabetes, mild bradycardia, CAD, CKD, anxiety, depression  and a history of mixed dementia both vascular and Alzheimer's etiology  seen today in follow up for memory loss. Patient is currently on donepezil  10 mg daily and memantine  10 mg twice daily. For mood is on Lexapro  30 mg daily.  Memory has remained fairly stable despite recent events in his life, including the sudden death of his wife which she is trying to adjust to. MMSE today is 22/30. His daughter states that he may be interested in attending grief counseling, but he has significant family support to get through this difficult time.  In addition, the patient is shortly to attend adult day program which will be helpful for cognitive and social stimulation.    Follow up in 6 months. Continue donepezil  10 mg daily and memantine  10 mg twice daily, side effects discussed. Recommend good control of her cardiovascular risk factors Continue to control mood, he is on Lexapro  30 mg daily . Agree with aerobic program for social and cognitive stimulation. Agree with grief counseling   Subjective:    This patient is accompanied in the office by daughter who supplements the history.  Previous records as well as any outside records available were reviewed prior to todays visit. Patient was last seen on 11/07/22    Any changes in memory since last visit?  Some good and bad days.  STM is worse .  LTM is good.  During the day, he likes to watch TV, especially game shows. Calls his son his brother and his dad, and his daughter his wife.  His wife died in 13-Jan-2024 of sudden heart failure which has been very difficult for him since he was very dependent on her.  Going to start attending a Adult Day Program, has a sitter who will be helping  and his grand-daughter who will be working from, and be with him for company and support.   Repeats oneself?  Endorsed, especially with stories. Disoriented when walking into a room? Denies.    Leaving objects?  May misplace things such as the key but not in unusual places.  Wandering behavior?  denies   Any personality changes since last visit?  As before, he is dealing with grief, has moments of irritability however he is trying to adjust to the new life without his dear wife. Any worsening depression?:  Denies.   Hallucinations or paranoia?  Denies.   Seizures? denies    Any sleep changes?  Denies vivid dreams, REM behavior or sleepwalking   Sleep apnea?   Denies.   Any hygiene concerns? Denies.  Independent of bathing and dressing?  Endorsed  Does the patient needs help with medications?  Granddaughter is in charge   Who is in charge of the finances?  Daughter and her husband is in charge.  Got scammed 1 month ago when they told him that he did not paid the bill     Any changes in appetite?  Appetite is fair.  He tries to eat healthier.    Patient have trouble swallowing? Denies.   Does the patient cook? No Any headaches?   denies   Any vision changes? Denies  Chronic back pain  denies   Ambulates with difficulty? Uses the cane for stability  Recent falls or head injuries?  While getting the mail he fell and needed help. No LOC or head injury.     Unilateral weakness, numbness or tingling? denies   Any tremors?  Denies   Any anosmia?  Denies   Any incontinence of urine?  Endorsed, wears pads and diapers. Doing PT NS treatment fr urge incontinence Any bowel dysfunction?   Denies      Patient lives  at home   Does the patient drive? No longer drives         History on Initial Assessment 06/18/2019: This is a 83 year old right-handed man with a history of hypertension, hyperlipidemia, diabetes, CAD, CKD, bradycardia, anxiety, depression, presenting for evaluation of memory loss.  His daughter,as well as wife on speakerphone, are present to provide additional information. Records were reviewed. He was seen 6 days ago at [facility] by neurologist Dr. Mason Sole, however they would still like to proceed with visit today for the same concern. SLUMS score was 18/30.  Bloodwork was ordered, he is scheduled for an MRI brain with NeuroQuant sequence. His family feels that memory changes started only a month ago. He became tearful stating that I cannot remember things, making him frustrated. He states some things I can remember, but others I do not. He would forget a conversation from 5 minutes prior. He used to remember announcer names well. He lives with his wife who has to double check behind him on his insulin, telling him what dosage to take. He writes down bills and his wife pays them. He got lost driving twice in the past month. He got lost driving to their granddaughter's graduation, he apparently got very upset/agitated. He does not remember this. He does recall getting lost driving to get gas. He states he just about quit driving. Family now has a GPS tracker, his wife is always in the car with him now. He is independent with dressing and bathing.  His daughter reports that he used to exercise more, then with the pandemic he got depressed. Family noticed that he did not want to be around his grandchildren anymore, getting easily agitated. Some people say something and it sends him off to another pedestal. He can be so sweet sometimes then curl up and not want to talk to anybody. He has always had issues with his siblings, always been a part of him, this seems to be getting better. They do feel that since memory changes started, he is doing better with other people. Paxil was recently changed to Lexapro , which he has been taking for only 2 days. No paranoia or hallucinations. He was started on Donepezil  5mg  daily 3 weeks ago which he is overall tolerating. No family history of  dementia. He does not drink alcohol. He denies any significant head injuries. He had a syncopal episode in 2018 where he woke up on the ground at work. I personally reviewed MRI brain with and without contrast done 11/2016 which did not show any acute changes. There was mild diffuse atrophy and mild to moderate chronic microvascular disease.    He used to have frequent headaches when he was working and they quieted down, but recently come back like they used to be. There is no associated nausea/vomiting, photo/phonophobia. Family thinks it is more stress-related. He denies any dizziness, diplopia, dysarthria/dysphagia, bowel/bladder dysfunction, anosmia. He has chronic neck and back pain. He has some tingling and numbness in his hands and feet. He has occasional hand tremors. Sleep is pretty good. They deny any REM behavior  disorder, but he has fallen out of bed while sleeping. This was initially happening every 2-3 weeks, but has quieted down, last occurred 3 weeks ago. Family reports he used to exercise more but stopped when the pandemic hit.   Laboratory Data: MRI brain without contrast done 06/2019 did not show any acute changes. There was moderate chronic microvascular disease, diffuse volume loss with whole brain volume at 2nd percentile. Neuropsychological testing done in 07/2019 noted that testing must be interpreted cautiously as he was tearful and visibly upset throughout much of the evaluation. He did not show clear memory storage problems and instead had notable encoding issues and relatively preserved retention of information across time on several tasks. He screened positive for depression. Diagnosis was Mild Cognitive Impairment, etiology unclear, possibly mixed AD and vascular, with interference from depression and anxiety.   PREVIOUS MEDICATIONS:   CURRENT MEDICATIONS:  Outpatient Encounter Medications as of 06/28/2023  Medication Sig   acetaminophen  (TYLENOL ) 325 MG tablet Take 650 mg by  mouth every 6 (six) hours as needed.   aspirin  EC 81 MG tablet Take 81 mg by mouth daily.   cholecalciferol (VITAMIN D) 1000 UNITS tablet Take 2,000 Units by mouth daily.    Continuous Glucose Receiver (FREESTYLE LIBRE 3 READER) DEVI USE AS DIRECTED TO MONITOR BLOOD GLUCOSE   Continuous Glucose Sensor (DEXCOM G7 SENSOR) MISC Use 1 each every 10 (ten) days   cyanocobalamin  (VITAMIN B12) 1000 MCG tablet Take 1,000 mcg by mouth daily.   donepezil  (ARICEPT ) 10 MG tablet Take 1 tablet (10 mg total) by mouth daily.   escitalopram  (LEXAPRO ) 20 MG tablet Take 1 and 1/2 tablets daily   ferrous sulfate 325 (65 FE) MG tablet Take 325 mg by mouth daily with breakfast.   furosemide  (LASIX ) 20 MG tablet Take 1 tablet (20 mg total) by mouth 3 (three) times a week.   gabapentin (NEURONTIN) 300 MG capsule Take 300-600 mg by mouth See admin instructions. Take 1 capsule (300 mg) by mouth in the morning & 2 capsules (600 mg) by mouth every night.   isosorbide  mononitrate (IMDUR ) 30 MG 24 hr tablet Take 1 tablet (30 mg total) by mouth daily.   levothyroxine (SYNTHROID) 50 MCG tablet Take 50 mcg by mouth daily before breakfast.   metFORMIN (GLUCOPHAGE) 500 MG tablet Take 250 mg by mouth 2 (two) times daily with a meal.   mirabegron  ER (MYRBETRIQ ) 50 MG TB24 tablet Take 1 tablet (50 mg total) by mouth daily.   NOVOLOG 100 UNIT/ML injection Inject 20-38 Units into the skin 3 (three) times daily with meals. Sliding scale insulin base rates= 12 units in the morning, 20 units with lunch,18 units with supper.   simvastatin (ZOCOR) 20 MG tablet Take 20 mg by mouth daily.   tamsulosin (FLOMAX) 0.4 MG CAPS capsule Take 0.4 mg by mouth daily. Takes 2 each morning.   traMADol (ULTRAM) 50 MG tablet Take 25-50 mg by mouth 2 (two) times daily as needed.   TRESIBA FLEXTOUCH 100 UNIT/ML FlexTouch Pen 30 Units. 31 Units at night   No facility-administered encounter medications on file as of 06/28/2023.       06/28/2023   12:00 PM  04/25/2022    3:00 PM 03/25/2021   12:00 PM  MMSE - Mini Mental State Exam  Orientation to time 2 3 5   Orientation to Place 3 5 5   Registration 3 3 3   Attention/ Calculation 4 4 2   Recall 2 1 2   Language- name 2 objects  2 2 2   Language- repeat 1 0 0  Language- follow 3 step command 3 2 1   Language- read & follow direction 1 1 1   Write a sentence 1 1 1   Copy design 0 1 0  Total score 22 23 22        No data to display          Objective:     PHYSICAL EXAMINATION:    VITALS:   Vitals:   06/28/23 1028  BP: (!) 142/65  Pulse: (!) 57  Resp: 20  SpO2: 95%  Weight: 202 lb (91.6 kg)  Height: 5' 8.5 (1.74 m)    GEN:  The patient appears stated age and is in NAD. HEENT:  Normocephalic, atraumatic.   Neurological examination:  General: NAD, well-groomed, appears stated age. Orientation: The patient is alert. Oriented to person, place and date Cranial nerves: There is good facial symmetry.The speech is fluent and clear. No aphasia or dysarthria. Fund of knowledge is appropriate. Recent and remote memory are impaired. Attention and concentration are reduced. Able to name objects and repeat phrases.  Hearing is decreased to conversational tone.   Sensation: Sensation is intact to light touch throughout Motor: Strength is at least antigravity x4. DTR's 2/4 in UE/LE     Movement examination: Tone: There is normal tone in the UE/LE Abnormal movements:  no tremor.  No myoclonus.  No asterixis.   Coordination:  There is no decremation with RAM's. Normal finger to nose  Gait and Station: The patient has some difficulty arising out of a deep-seated chair without the use of the hands due to chronic pain. The patient's stride length is good.  Gait is cautious and narrow.    Thank you for allowing us  the opportunity to participate in the care of this nice patient. Please do not hesitate to contact us  for any questions or concerns.   Total time spent on today's visit was 30 minutes  dedicated to this patient today, preparing to see patient, examining the patient, ordering tests and/or medications and counseling the patient, documenting clinical information in the EHR or other health record, independently interpreting results and communicating results to the patient/family, discussing treatment and goals, answering patient's questions and coordinating care.  Cc:  Monique Ano, MD  Tex Filbert 06/28/2023 12:19 PM

## 2023-06-30 DIAGNOSIS — G4733 Obstructive sleep apnea (adult) (pediatric): Secondary | ICD-10-CM | POA: Diagnosis not present

## 2023-07-04 ENCOUNTER — Ambulatory Visit: Admitting: Urology

## 2023-07-05 ENCOUNTER — Ambulatory Visit: Admitting: Physician Assistant

## 2023-07-05 DIAGNOSIS — R350 Frequency of micturition: Secondary | ICD-10-CM | POA: Diagnosis not present

## 2023-07-05 NOTE — Progress Notes (Signed)
 PTNS  Session # 7  Health & Social Factors: no change Caffeine: none Alcohol: none Daytime voids #per day: 3-4 Night-time voids #per night: 2 Urgency: none to mild Incontinence Episodes #per day: 2-3 Ankle used: right Treatment Setting: 4 Feeling/ Response: sensory Comments: none  Performed By: Delane Fear RMA  Follow Up: 1 week

## 2023-07-10 NOTE — Progress Notes (Unsigned)
 PTNS  Session # 8/12  Health & Social Factors: No change Caffeine: 0 Alcohol: 0 Daytime voids #per day: 6 Night-time voids #per night: 2 Urgency: strong Incontinence Episodes #per day: 3 Ankle used: Right Treatment Setting: 15 Feeling/ Response: Sensory Comments: Patient tolerated  Performed By: CLOTILDA CORNWALL, PA-C   Follow Up: One week for 9/12

## 2023-07-11 ENCOUNTER — Ambulatory Visit: Admitting: Urology

## 2023-07-11 DIAGNOSIS — R350 Frequency of micturition: Secondary | ICD-10-CM | POA: Diagnosis not present

## 2023-07-18 ENCOUNTER — Ambulatory Visit
Admission: RE | Admit: 2023-07-18 | Discharge: 2023-07-18 | Disposition: A | Discharge status: Home / Self Care | Source: Ambulatory Visit | Attending: Physician Assistant | Admitting: Physician Assistant

## 2023-07-18 ENCOUNTER — Other Ambulatory Visit: Payer: Self-pay | Admitting: Physician Assistant

## 2023-07-18 DIAGNOSIS — R1032 Left lower quadrant pain: Secondary | ICD-10-CM | POA: Insufficient documentation

## 2023-07-18 DIAGNOSIS — R1012 Left upper quadrant pain: Secondary | ICD-10-CM | POA: Diagnosis not present

## 2023-07-18 DIAGNOSIS — K8689 Other specified diseases of pancreas: Secondary | ICD-10-CM | POA: Diagnosis not present

## 2023-07-18 DIAGNOSIS — K5730 Diverticulosis of large intestine without perforation or abscess without bleeding: Secondary | ICD-10-CM | POA: Diagnosis not present

## 2023-07-18 DIAGNOSIS — K429 Umbilical hernia without obstruction or gangrene: Secondary | ICD-10-CM | POA: Diagnosis not present

## 2023-07-18 LAB — POCT I-STAT CREATININE: Creatinine, Ser: 1.5 mg/dL — ABNORMAL HIGH (ref 0.61–1.24)

## 2023-07-18 MED ORDER — IOHEXOL 300 MG/ML  SOLN
100.0000 mL | Freq: Once | INTRAMUSCULAR | Status: AC | PRN
  Administered 2023-07-18: 100 mL via INTRAVENOUS

## 2023-07-19 ENCOUNTER — Ambulatory Visit: Admitting: Urology

## 2023-07-19 DIAGNOSIS — R350 Frequency of micturition: Secondary | ICD-10-CM | POA: Diagnosis not present

## 2023-07-19 NOTE — Progress Notes (Signed)
 PTNS  Session # 9/12  Health & Social Factors: No change  Caffeine: 0 Alcohol: 0 Daytime voids #per day: 6 Night-time voids #per night: 2 Urgency: Mild Incontinence Episodes #per day: 3 Ankle used: Right Treatment Setting: 8 Feeling/ Response: Sensory  Comments: Patient tolerated   Performed By: CLOTILDA CORNWALL, PA-C   Follow Up: One week for # 10/12 PTNS

## 2023-07-24 NOTE — Progress Notes (Unsigned)
 PTNS  Session # 10/10  Health & Social Factors: No change Caffeine: 0 Alcohol: 0 Daytime voids #per day: 8 Night-time voids #per night: 2 Urgency: strong Incontinence Episodes #per day: 3 Ankle used: right  Treatment Setting: 5 Feeling/ Response: Sensory  Comments: Patient tolerated  Performed By: CLOTILDA CORNWALL, PA-C   Follow Up:  One week for # 11/12 PTNS

## 2023-07-25 ENCOUNTER — Ambulatory Visit: Admitting: Urology

## 2023-07-25 DIAGNOSIS — R350 Frequency of micturition: Secondary | ICD-10-CM

## 2023-07-30 DIAGNOSIS — G4733 Obstructive sleep apnea (adult) (pediatric): Secondary | ICD-10-CM | POA: Diagnosis not present

## 2023-07-31 NOTE — Progress Notes (Unsigned)
 PTNS  Session # 11/12  Health & Social Factors: no change Caffeine: 0 Alcohol: 0 Daytime voids #per day: 5-6 Night-time voids #per night: 2-3 Urgency: severe Incontinence Episodes #per day: 1-2 Ankle used: right Treatment Setting: 13 Feeling/ Response: toe flex Comments: n/a  Performed By: Mathew Pinal RN  Follow Up: 1 week for number 12 out of 12 PTNS

## 2023-08-01 ENCOUNTER — Ambulatory Visit: Admitting: Urology

## 2023-08-01 ENCOUNTER — Telehealth: Payer: Self-pay | Admitting: Neurology

## 2023-08-01 ENCOUNTER — Other Ambulatory Visit: Payer: Self-pay | Admitting: *Deleted

## 2023-08-01 DIAGNOSIS — R350 Frequency of micturition: Secondary | ICD-10-CM

## 2023-08-01 DIAGNOSIS — F411 Generalized anxiety disorder: Secondary | ICD-10-CM

## 2023-08-01 DIAGNOSIS — F02818 Dementia in other diseases classified elsewhere, unspecified severity, with other behavioral disturbance: Secondary | ICD-10-CM

## 2023-08-01 MED ORDER — ESCITALOPRAM OXALATE 10 MG PO TABS: 10.0000 mg | ORAL_TABLET | Freq: Every day | ORAL | 3 refills | Status: DC

## 2023-08-01 MED ORDER — ESCITALOPRAM OXALATE 10 MG PO TABS: ORAL_TABLET | ORAL | 3 refills | Status: DC

## 2023-08-01 MED ORDER — ESCITALOPRAM OXALATE 20 MG PO TABS: ORAL_TABLET | ORAL | 3 refills | Status: DC

## 2023-08-01 MED ORDER — ESCITALOPRAM OXALATE 20 MG PO TABS: 20.0000 mg | ORAL_TABLET | Freq: Every day | ORAL | 3 refills | Status: DC

## 2023-08-01 NOTE — Addendum Note (Signed)
 Addended by: GEORJEAN DARICE HERO on: 08/01/2023 11:04 PM   Modules accepted: Orders

## 2023-08-01 NOTE — Telephone Encounter (Signed)
 Pt. Caregiver called about Rx dosage ESCITALOPRAM , it ask for 1 tab and 1/2 so there is a large amount of extra. Can it be changed to fit the correct dosage

## 2023-08-01 NOTE — Telephone Encounter (Signed)
 We can send for 2 prescriptions: 20mg  and 10mg , he will need to take both once a day for a total of 30mg  daily. If they agree, ok to send in Rx for Escitalopram  20mg : take 1 tablet daily (take with 10mg  tablet for total of 30mg  daily), and another Rx for Escitalopram  10mg : Take 1 tablet daily (take with 20mg  tablet for total of 30mg  daily). Thanks

## 2023-08-01 NOTE — Telephone Encounter (Signed)
 Spoke to Kechi (daughter) agreed to try Escitalopram  20mg  /10mg   w/ instruction sent to [facility] [place]

## 2023-08-08 ENCOUNTER — Ambulatory Visit: Admitting: Urology

## 2023-08-08 DIAGNOSIS — R350 Frequency of micturition: Secondary | ICD-10-CM

## 2023-08-08 NOTE — Progress Notes (Signed)
 PTNS  Session # 12/12  Health & Social Factors: No change Caffeine: 0 Alcohol: 0 Daytime voids #per day: 4 Night-time voids #per night: 3 Urgency: mild Incontinence Episodes #per day: 3 Ankle used: right Treatment Setting: 4 Feeling/ Response: Sensory  Comments: Patient tolerated   Performed By: Laymon Ned, CMA    Follow Up:  One month for maintenance PTNS

## 2023-08-16 DIAGNOSIS — Z794 Long term (current) use of insulin: Secondary | ICD-10-CM | POA: Diagnosis not present

## 2023-08-16 DIAGNOSIS — E1142 Type 2 diabetes mellitus with diabetic polyneuropathy: Secondary | ICD-10-CM | POA: Diagnosis not present

## 2023-08-27 DIAGNOSIS — J019 Acute sinusitis, unspecified: Secondary | ICD-10-CM | POA: Diagnosis not present

## 2023-08-28 ENCOUNTER — Ambulatory Visit: Admitting: Physician Assistant

## 2023-08-30 ENCOUNTER — Ambulatory Visit: Admitting: Physician Assistant

## 2023-08-30 DIAGNOSIS — G4733 Obstructive sleep apnea (adult) (pediatric): Secondary | ICD-10-CM | POA: Diagnosis not present

## 2023-09-05 DIAGNOSIS — E785 Hyperlipidemia, unspecified: Secondary | ICD-10-CM | POA: Diagnosis not present

## 2023-09-05 DIAGNOSIS — E1159 Type 2 diabetes mellitus with other circulatory complications: Secondary | ICD-10-CM | POA: Diagnosis not present

## 2023-09-05 DIAGNOSIS — E1142 Type 2 diabetes mellitus with diabetic polyneuropathy: Secondary | ICD-10-CM | POA: Diagnosis not present

## 2023-09-05 DIAGNOSIS — Z794 Long term (current) use of insulin: Secondary | ICD-10-CM | POA: Diagnosis not present

## 2023-09-05 DIAGNOSIS — I152 Hypertension secondary to endocrine disorders: Secondary | ICD-10-CM | POA: Diagnosis not present

## 2023-09-05 DIAGNOSIS — E1169 Type 2 diabetes mellitus with other specified complication: Secondary | ICD-10-CM | POA: Diagnosis not present

## 2023-09-11 ENCOUNTER — Ambulatory Visit: Admitting: Physician Assistant

## 2023-09-12 DIAGNOSIS — D044 Carcinoma in situ of skin of scalp and neck: Secondary | ICD-10-CM | POA: Diagnosis not present

## 2023-09-27 ENCOUNTER — Ambulatory Visit: Admitting: Physician Assistant

## 2023-09-27 DIAGNOSIS — R350 Frequency of micturition: Secondary | ICD-10-CM

## 2023-09-28 NOTE — Progress Notes (Signed)
 Virtual Visit via Telephone Note  I connected with Ronald Wallace on 09/28/23 at  4:00 PM EDT by telephone and verified that I am speaking with the correct person using two identifiers.  Location: Patient: [place],  Provider: [name], [place]   I discussed the limitations, risks, security and privacy concerns of performing an evaluation and management service by telephone and the availability of in person appointments. I also discussed with the patient that there may be a patient responsible charge related to this service. The patient expressed understanding and agreed to proceed.   History of Present Illness: Ronald Wallace is a 83 y.o. male with PMH dementia, BPH on Flomax 0.8 mg daily, and on review of Myrbetriq  50 mg who failed Gemtesa  due to cost and recently completed PTNS x 12 who presents today for follow-up.  He is joined today by his caregiver via telephone, who contributes to HPI.  Today they report no significant improvement in his voiding symptoms with PTNS.  They do not wish to pursue maintenance treatments.  Reported symptoms as follows:  PTNS #1 PTNS #12  Daytime voids 7 4  Nocturia 4 3  Urge severity severe mild  Incontinence episodes per day 2 3     Observations/Objective: Answering questions appropriately, no apparent distress.  Assessment and Plan: 1. Urination frequency (Primary) Weekly symptom sheets show some modest improvement on PTNS, however patient and caregiver do not feel there has been significant enough improvement to justify maintenance treatments.  We will defer these and continue Myrbetriq  alone.  Follow Up Instructions: Return for Keep follow-up as scheduled.    I discussed the assessment and treatment plan with the patient. The patient was provided an opportunity to ask questions and all were answered. The patient agreed with the plan and demonstrated an understanding of the instructions.   The patient was advised to call back or seek an  in-person evaluation if the symptoms worsen or if the condition fails to improve as anticipated.  I provided 15 minutes of non-face-to-face time during this encounter.   April Colter, PA-C

## 2023-11-02 DIAGNOSIS — M5416 Radiculopathy, lumbar region: Secondary | ICD-10-CM | POA: Diagnosis not present

## 2023-11-07 ENCOUNTER — Encounter: Payer: Self-pay | Admitting: Neurology

## 2023-11-07 ENCOUNTER — Ambulatory Visit: Payer: PPO | Admitting: Neurology

## 2023-11-07 VITALS — BP 149/68 | HR 55 | Resp 20 | Ht 68.5 in | Wt 198.0 lb

## 2023-11-07 DIAGNOSIS — F411 Generalized anxiety disorder: Secondary | ICD-10-CM | POA: Diagnosis not present

## 2023-11-07 DIAGNOSIS — F01518 Vascular dementia, unspecified severity, with other behavioral disturbance: Secondary | ICD-10-CM | POA: Diagnosis not present

## 2023-11-07 DIAGNOSIS — F02818 Dementia in other diseases classified elsewhere, unspecified severity, with other behavioral disturbance: Secondary | ICD-10-CM

## 2023-11-07 DIAGNOSIS — G309 Alzheimer's disease, unspecified: Secondary | ICD-10-CM

## 2023-11-07 MED ORDER — DONEPEZIL HCL 10 MG PO TABS: 10.0000 mg | ORAL_TABLET | Freq: Every day | ORAL | 3 refills | Status: AC

## 2023-11-07 MED ORDER — MEMANTINE HCL 5 MG PO TABS: ORAL_TABLET | ORAL | 3 refills | Status: AC

## 2023-11-07 MED ORDER — ESCITALOPRAM OXALATE 20 MG PO TABS: ORAL_TABLET | ORAL | 3 refills | Status: AC

## 2023-11-07 MED ORDER — ESCITALOPRAM OXALATE 10 MG PO TABS: ORAL_TABLET | ORAL | 3 refills | Status: AC

## 2023-11-07 NOTE — Patient Instructions (Addendum)
 Good to see you.  Let's try restarting Memantine  5mg : take 1 tablet every night. If no side effects, we can increase to 5mg  twice a day. Keep us  updated  2. Continue Donepezil  10mg  daily and Lexapro  30mg  daily  3. You can try Magnesium glycinate 400mg  every night and see if this helps calm him down.   4. Continue to monitor mood, one option to help stabilize mood is Depakote  5. Follow-up with Camie Sevin, PA-C in 6 mos   FALL PRECAUTIONS: Be cautious when walking. Scan the area for obstacles that may increase the risk of trips and falls. When getting up in the mornings, sit up at the edge of the bed for a few minutes before getting out of bed. Consider elevating the bed at the head end to avoid drop of blood pressure when getting up. Walk always in a well-lit room (use night lights in the walls). Avoid area rugs or power cords from appliances in the middle of the walkways. Use a walker or a cane if necessary and consider physical therapy for balance exercise. Get your eyesight checked regularly.  HOME SAFETY: Consider the safety of the kitchen when operating appliances like stoves, microwave oven, and blender. Consider having supervision and share cooking responsibilities until no longer able to participate in those. Accidents with firearms and other hazards in the house should be identified and addressed as well.   ABILITY TO BE LEFT ALONE: If patient is unable to contact 911 operator, consider using LifeLine, or when the need is there, arrange for someone to stay with patients. Smoking is a fire hazard, consider supervision or cessation. Risk of wandering should be assessed by caregiver and if detected at any point, supervision and safe proof recommendations should be instituted.  MEDICATION SUPERVISION: Inability to self-administer medication needs to be constantly addressed. Implement a mechanism to ensure safe administration of the medications.  RECOMMENDATIONS FOR ALL PATIENTS WITH  MEMORY PROBLEMS: 1. Continue to exercise (Recommend 30 minutes of walking everyday, or 3 hours every week) 2. Increase social interactions - continue going to [place] and enjoy social gatherings with friends and family 3. Eat healthy, avoid fried foods and eat more fruits and vegetables 4. Maintain adequate blood pressure, blood sugar, and blood cholesterol level. Reducing the risk of stroke and cardiovascular disease also helps promoting better memory. 5. Avoid stressful situations. Live a simple life and avoid aggravations. Organize your time and prepare for the next day in anticipation. 6. Sleep well, avoid any interruptions of sleep and avoid any distractions in the bedroom that may interfere with adequate sleep quality 7. Avoid sugar, avoid sweets as there is a strong link between excessive sugar intake, diabetes, and cognitive impairment The Mediterranean diet has been shown to help patients reduce the risk of progressive memory disorders and reduces cardiovascular risk. This includes eating fish, eat fruits and green leafy vegetables, nuts like almonds and hazelnuts, walnuts, and also use olive oil. Avoid fast foods and fried foods as much as possible. Avoid sweets and sugar as sugar use has been linked to worsening of memory function.  There is always a concern of gradual progression of memory problems. If this is the case, then we may need to adjust level of care according to patient needs. Support, both to the patient and caregiver, should then be put into place.

## 2023-11-07 NOTE — Progress Notes (Signed)
 NEUROLOGY FOLLOW UP OFFICE NOTE  Ronald Wallace [id number] [birth year]  HISTORY OF PRESENT ILLNESS: I had the pleasure of seeing Ronald Wallace in follow-up in the neurology clinic on 11/27/2023.  The patient was last seen by Memory Disorders PA Camie Sevin 4 months ago for dementia. MMSE 22/30 in 06/2023. He is again accompanied by his daughter who helps supplement the history today. Records and images were personally reviewed where available.  He states I can't remember nothing anymore. His wife passed away in 2023-02-08. His daughter and granddaughter who are now his caregivers moved in last July. He could not recall details from a phone call earlier that day. He stopped driving 2 years ago. He is needing more help with medications, he accidentally took 2 doses of Insulin yesterday. He has had difficulty managing his bank account. He felt the appointment was thrown on him, despite being written down on the calendar. He tried the senior program and hates it. He is getting grumpier. He got very agitated one time looking for the remote control, he was in a bad mood for the whole day. No paranoia or hallucinations. He denies any difficulty sleeping. He denies significant headache. His daughter reports he still gets dizzy after eating, he states he has not had dizziness. He was previously on Memantine  and felt dizziness was caused by Memantine , but despite stopping in 2023, there was no change in symptoms. He is on Donepezil  10mg  daily and Lexapro  30mg  daily without side effects. Last fall was 8 months ago.    History on Initial Assessment 06/18/2019: This is a 83 year old right-handed man with a history of hypertension, hyperlipidemia, diabetes, CAD, CKD, bradycardia, anxiety, depression, presenting for evaluation of memory loss. His daughter,as well as wife on speakerphone, are present to provide additional information. Records were reviewed. He was seen 6 days ago at [facility] by neurologist Dr.  Maree, however they would still like to proceed with visit today for the same concern. SLUMS score was 18/30.  Bloodwork was ordered, he is scheduled for an MRI brain with NeuroQuant sequence. His family feels that memory changes started only a month ago. He became tearful stating that I cannot remember things, making him frustrated. He states some things I can remember, but others I do not. He would forget a conversation from 5 minutes prior. He used to remember announcer names well. He lives with his wife who has to double check behind him on his insulin, telling him what dosage to take. He writes down bills and his wife pays them. He got lost driving twice in the past month. He got lost driving to their granddaughter's graduation, he apparently got very upset/agitated. He does not remember this. He does recall getting lost driving to get gas. He states he just about quit driving. Family now has a GPS tracker, his wife is always in the car with him now. He is independent with dressing and bathing.  His daughter reports that he used to exercise more, then with the pandemic he got depressed. Family noticed that he did not want to be around his grandchildren anymore, getting easily agitated. Some people say something and it sends him off to another pedestal. He can be so sweet sometimes then curl up and not want to talk to anybody. He has always had issues with his siblings, always been a part of him, this seems to be getting better. They do feel that since memory changes started, he is doing better with other  people. Paxil was recently changed to Lexapro , which he has been taking for only 2 days. No paranoia or hallucinations. He was started on Donepezil  5mg  daily 3 weeks ago which he is overall tolerating. No family history of dementia. He does not drink alcohol. He denies any significant head injuries. He had a syncopal episode in 2018 where he woke up on the ground at work. I personally reviewed MRI  brain with and without contrast done 11/2016 which did not show any acute changes. There was mild diffuse atrophy and mild to moderate chronic microvascular disease.    He used to have frequent headaches when he was working and they quieted down, but recently come back like they used to be. There is no associated nausea/vomiting, photo/phonophobia. Family thinks it is more stress-related. He denies any dizziness, diplopia, dysarthria/dysphagia, bowel/bladder dysfunction, anosmia. He has chronic neck and back pain. He has some tingling and numbness in his hands and feet. He has occasional hand tremors. Sleep is pretty good. They deny any REM behavior disorder, but he has fallen out of bed while sleeping. This was initially happening every 2-3 weeks, but has quieted down, last occurred 3 weeks ago. Family reports he used to exercise more but stopped when the pandemic hit.   Laboratory Data: MRI brain without contrast done 06/2019 did not show any acute changes. There was moderate chronic microvascular disease, diffuse volume loss with whole brain volume at 2nd percentile. Neuropsychological testing done in 07/2019 noted that testing must be interpreted cautiously as he was tearful and visibly upset throughout much of the evaluation. He did not show clear memory storage problems and instead had notable encoding issues and relatively preserved retention of information across time on several tasks. He screened positive for depression. Diagnosis was Mild Cognitive Impairment, etiology unclear, possibly mixed AD and vascular, with interference from depression and anxiety.   PAST MEDICAL HISTORY: Past Medical History:  Diagnosis Date   Anemia of chronic disease 07/01/2015   Arthritis    Benign prostatic hyperplasia with nocturia 10/30/2016   Bradycardia 03/12/2014   Bruit of left carotid artery 03/12/2014   CKD (chronic kidney disease) 09/27/2015   stage 3, GFR 30-59 ml/min   Coronary artery disease  involving native coronary artery of native heart without angina pectoris    a. 02/2014 Neg Ex MV, EF 65%;  b. 06/2015 Abnl MV ([facility])-->Cath ([facility]): LM nl, LAD 30p/m, LCX 30p, OM2 50p, RCA 100p w/ L-->R collats, nl EF-->Med Rx; c. 10/2017 MV: No isch/scar; d. 11/2017 Cath: LM nl, LAD 45p/m, LCX 30ost, OM2 30, RCA 100 CTO, L->R collats. PA 31/16/22, PCWP 11; e. 05/2020 MV: EF 67%, no isch/scar->low risk.   Diabetes mellitus, type II, insulin dependent    Diabetic neuropathy    Diabetic retinopathy associated with type 2 diabetes mellitus 10/28/2018   Disorder of bone and cartilage    Diverticulitis    DOE (dyspnea on exertion) 03/12/2014   Generalized anxiety disorder    GERD (gastroesophageal reflux disease)    Hypertension associated with diabetes    Major depressive disorder    Major neurocognitive disorder due to multiple etiologies 08/04/2020   likely Alzheimer's disease w/vascular contributions   Mixed hyperlipidemia due to type 2 diabetes mellitus 02/06/2017   Obesity    Osteoporosis    Pure hypercholesterolemia    Syncope and collapse    a. 02/2014 Carotid Dopplers: 1-39% bilateral ICA with normal Subclavian & vertebral arteries; b. 02/2014 2 D Echo: EF 50-55%, Gr 1 DD,  Mild MR & LA dilation -- was bradycardic in 40s; c. 04/2014 Event monitor: RSR/SA/SB. Occas PVC's. No AFib.    MEDICATIONS: Current Outpatient Medications on File Prior to Visit  Medication Sig Dispense Refill   acetaminophen  (TYLENOL ) 325 MG tablet Take 650 mg by mouth every 6 (six) hours as needed.     aspirin  EC 81 MG tablet Take 81 mg by mouth daily.     cholecalciferol (VITAMIN D) 1000 UNITS tablet Take 2,000 Units by mouth daily.      Continuous Glucose Receiver (FREESTYLE LIBRE 3 READER) DEVI USE AS DIRECTED TO MONITOR BLOOD GLUCOSE     donepezil  (ARICEPT ) 10 MG tablet Take 1 tablet (10 mg total) by mouth daily. 90 tablet 3   escitalopram  (LEXAPRO ) 20 MG tablet Take 1 tablet daily (take with 10mg  tablet  for total of 30mg  daily) 90 tablet 3   gabapentin (NEURONTIN) 300 MG capsule Take 300-600 mg by mouth See admin instructions. Take 1 capsule (300 mg) by mouth in the morning & 2 capsules (600 mg) by mouth every night.     isosorbide  mononitrate (IMDUR ) 30 MG 24 hr tablet Take 1 tablet (30 mg total) by mouth daily. 90 tablet 3   levothyroxine (SYNTHROID) 50 MCG tablet Take 50 mcg by mouth daily before breakfast.     metFORMIN (GLUCOPHAGE) 500 MG tablet Take 250 mg by mouth 2 (two) times daily with a meal.     mirabegron  ER (MYRBETRIQ ) 50 MG TB24 tablet Take 1 tablet (50 mg total) by mouth daily. 30 tablet 11   NOVOLOG 100 UNIT/ML injection Inject 20-38 Units into the skin 3 (three) times daily with meals. Sliding scale insulin base rates= 12 units in the morning, 20 units with lunch,18 units with supper.     simvastatin (ZOCOR) 20 MG tablet Take 20 mg by mouth daily.     tamsulosin (FLOMAX) 0.4 MG CAPS capsule Take 0.4 mg by mouth daily. Takes 2 each morning.     traMADol (ULTRAM) 50 MG tablet Take 25-50 mg by mouth 2 (two) times daily as needed.     TRESIBA FLEXTOUCH 100 UNIT/ML FlexTouch Pen 30 Units. 31 Units at night     Continuous Glucose Sensor (DEXCOM G7 SENSOR) MISC Use 1 each every 10 (ten) days     cyanocobalamin  (VITAMIN B12) 1000 MCG tablet Take 1,000 mcg by mouth daily.     escitalopram  (LEXAPRO ) 10 MG tablet Take 1 tablet daily (take with 20mg  tablet for total of 30mg  daily) 90 tablet 3   ferrous sulfate 325 (65 FE) MG tablet Take 325 mg by mouth daily with breakfast.     furosemide  (LASIX ) 20 MG tablet Take 1 tablet (20 mg total) by mouth 3 (three) times a week. 90 tablet 3   No current facility-administered medications on file prior to visit.    ALLERGIES: Allergies  Allergen Reactions   Amlodipine     Other reaction(s): Other (See Comments) Peripheral edema    FAMILY HISTORY: Family History  Problem Relation Age of Onset   Heart attack Mother    Breast cancer Mother     Lung cancer Father     SOCIAL HISTORY: Social History   Socioeconomic History   Marital status: Married    Spouse name: Not on file   Number of children: Not on file   Years of education: 14   Highest education level: Associate degree: academic program  Occupational History   Occupation: Retired    Comment: Engineer, maintenance  Tobacco Use   Smoking status: Former    Types: Pipe    Quit date: 1965    Years since quitting: 60.8    Passive exposure: Never   Smokeless tobacco: Never  Vaping Use   Vaping status: Never Used  Substance and Sexual Activity   Alcohol use: No   Drug use: No   Sexual activity: Not on file  Other Topics Concern   Not on file  Social History Narrative   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   Married - at least 1 daughter.   Never Smoked   Usually very active, but no exercise routine.   Right handed    Social Drivers of Health   Financial Resource Strain: Low Risk  (12/19/2022)   Received from [facility]   Overall Financial Resource Strain (CARDIA)    Difficulty of Paying Living Expenses: Not hard at all  Food Insecurity: No Food Insecurity (12/19/2022)   Received from [facility]   Hunger Vital Sign    Within the past 12 months, you worried that your food would run out before you got the money to buy more.: Never true    Within the past 12 months, the food you bought just didn't last and you didn't have money to get more.: Never true  Transportation Needs: No Transportation Needs (12/19/2022)   Received from [facility] - Transportation    In the past 12 months, has lack of transportation kept you from medical appointments or from getting medications?: No    Lack of Transportation (Non-Medical): No  Physical Activity: Not on file  Stress: Not on file  Social Connections: Not on file  Intimate Partner Violence: Not on file     PHYSICAL EXAM: Vitals:   11/07/23 1447  BP:  (!) 149/68  Pulse: (!) 55  Resp: 20   General: No acute distress Head:  Normocephalic/atraumatic Skin/Extremities: No rash, no edema Neurological Exam: alert and awake. No aphasia or dysarthria. Fund of knowledge is appropriate.  Attention and concentration are normal.   Cranial nerves: Pupils equal, round. Extraocular movements intact with no nystagmus. Visual fields full.  No facial asymmetry.  Motor: Bulk and tone normal, muscle strength 5/5 throughout with no pronator drift.   Finger to nose testing intact.  Gait slow and cautious reporting left leg bothers him more, no ataxia. No tremors.    IMPRESSION: This is an 83 yo RH man with a history of hypertension, hyperlipidemia, diabetes, mild bradycardia, CAD, CKD, anxiety, depression, with mixed dementia due to Alzheimer's and vascular disease. MMSE 22/30 in 07/2023. There has been continued progression with his wife's passing. They are interested in restarting Memantine  5mg  at bedtime for 1 week, then 5mg  BID as tolerated, monitor if this worsens dizziness. Continue Donepezil  10mg  daily and Lexapro  30mg  daily. His daughter asks about supplement that may help with calming him down, he can try magnesium glycinate and see if this helps. Continue to monitor, Depakote may be considered on next visit to help with mood stabilization. Follow-up in 6 months with Memory Disorders PA Camie Sevin, call for any changes.    Thank you for allowing me to participate in his care.  Please do not hesitate to call for any questions or concerns.    Darice Shivers, M.D.   CC: Dr. Alla

## 2023-11-13 DIAGNOSIS — D2272 Melanocytic nevi of left lower limb, including hip: Secondary | ICD-10-CM | POA: Diagnosis not present

## 2023-11-13 DIAGNOSIS — D225 Melanocytic nevi of trunk: Secondary | ICD-10-CM | POA: Diagnosis not present

## 2023-11-13 DIAGNOSIS — Z85820 Personal history of malignant melanoma of skin: Secondary | ICD-10-CM | POA: Diagnosis not present

## 2023-11-13 DIAGNOSIS — Z85828 Personal history of other malignant neoplasm of skin: Secondary | ICD-10-CM | POA: Diagnosis not present

## 2023-11-13 DIAGNOSIS — L570 Actinic keratosis: Secondary | ICD-10-CM | POA: Diagnosis not present

## 2023-11-13 DIAGNOSIS — D2261 Melanocytic nevi of right upper limb, including shoulder: Secondary | ICD-10-CM | POA: Diagnosis not present

## 2023-11-13 DIAGNOSIS — D2262 Melanocytic nevi of left upper limb, including shoulder: Secondary | ICD-10-CM | POA: Diagnosis not present

## 2023-11-14 DIAGNOSIS — Z794 Long term (current) use of insulin: Secondary | ICD-10-CM | POA: Diagnosis not present

## 2023-11-14 DIAGNOSIS — E1142 Type 2 diabetes mellitus with diabetic polyneuropathy: Secondary | ICD-10-CM | POA: Diagnosis not present

## 2023-11-21 DIAGNOSIS — Z794 Long term (current) use of insulin: Secondary | ICD-10-CM | POA: Diagnosis not present

## 2023-11-21 DIAGNOSIS — E1159 Type 2 diabetes mellitus with other circulatory complications: Secondary | ICD-10-CM | POA: Diagnosis not present

## 2023-11-21 DIAGNOSIS — E785 Hyperlipidemia, unspecified: Secondary | ICD-10-CM | POA: Diagnosis not present

## 2023-11-21 DIAGNOSIS — E1142 Type 2 diabetes mellitus with diabetic polyneuropathy: Secondary | ICD-10-CM | POA: Diagnosis not present

## 2023-11-21 DIAGNOSIS — E039 Hypothyroidism, unspecified: Secondary | ICD-10-CM | POA: Diagnosis not present

## 2023-11-21 DIAGNOSIS — E1169 Type 2 diabetes mellitus with other specified complication: Secondary | ICD-10-CM | POA: Diagnosis not present

## 2023-11-21 DIAGNOSIS — I152 Hypertension secondary to endocrine disorders: Secondary | ICD-10-CM | POA: Diagnosis not present

## 2023-11-28 ENCOUNTER — Other Ambulatory Visit: Payer: Self-pay | Admitting: Physician Assistant

## 2023-11-28 DIAGNOSIS — R350 Frequency of micturition: Secondary | ICD-10-CM

## 2023-12-04 ENCOUNTER — Encounter: Payer: Self-pay | Admitting: Neurology

## 2024-01-25 NOTE — Progress Notes (Signed)
 Ronald Wallace                                          MRN: [id number]   01/25/2024   The [facility] Quality Team Specialist reviewed this patient medical record for the purposes of chart review for care gap closure. The following were reviewed: chart review for care gap closure-kidney health evaluation for diabetes:eGFR  and uACR.    [facility] Quality Team

## 2024-04-03 ENCOUNTER — Ambulatory Visit: Admitting: Physician Assistant

## 2024-04-21 ENCOUNTER — Ambulatory Visit: Admitting: Physician Assistant

## 2024-11-12 ENCOUNTER — Ambulatory Visit: Admitting: Neurology
